# Patient Record
Sex: Male | Born: 1950 | Race: White | Hispanic: No | Marital: Married | Smoking: Never smoker
Health system: Southern US, Community
[De-identification: ages and names within clinical notes are randomized; demographics above are authoritative.]

## PROBLEM LIST (undated history)

## (undated) DIAGNOSIS — I10 Essential (primary) hypertension: Secondary | ICD-10-CM

## (undated) DIAGNOSIS — D497 Neoplasm of unspecified behavior of endocrine glands and other parts of nervous system: Secondary | ICD-10-CM

## (undated) DIAGNOSIS — Q282 Arteriovenous malformation of cerebral vessels: Secondary | ICD-10-CM

## (undated) DIAGNOSIS — E119 Type 2 diabetes mellitus without complications: Secondary | ICD-10-CM

## (undated) DIAGNOSIS — G459 Transient cerebral ischemic attack, unspecified: Secondary | ICD-10-CM

## (undated) HISTORY — PX: HERNIA REPAIR: SHX51

## (undated) HISTORY — PX: EYE SURGERY: SHX253

## (undated) HISTORY — PX: COLONOSCOPY: SHX174

---

## 2006-06-13 ENCOUNTER — Other Ambulatory Visit: Payer: Self-pay

## 2006-06-13 ENCOUNTER — Inpatient Hospital Stay: Payer: Self-pay | Admitting: Internal Medicine

## 2006-12-09 ENCOUNTER — Emergency Department: Payer: Self-pay | Admitting: Emergency Medicine

## 2006-12-11 ENCOUNTER — Emergency Department: Payer: Self-pay

## 2007-05-02 ENCOUNTER — Ambulatory Visit: Payer: Self-pay | Admitting: Unknown Physician Specialty

## 2010-11-04 DIAGNOSIS — R12 Heartburn: Secondary | ICD-10-CM | POA: Insufficient documentation

## 2012-04-11 DIAGNOSIS — N200 Calculus of kidney: Secondary | ICD-10-CM | POA: Insufficient documentation

## 2012-04-11 DIAGNOSIS — R310 Gross hematuria: Secondary | ICD-10-CM | POA: Insufficient documentation

## 2013-01-04 DIAGNOSIS — L988 Other specified disorders of the skin and subcutaneous tissue: Secondary | ICD-10-CM | POA: Insufficient documentation

## 2014-09-25 DIAGNOSIS — F332 Major depressive disorder, recurrent severe without psychotic features: Secondary | ICD-10-CM | POA: Insufficient documentation

## 2015-04-29 DIAGNOSIS — E782 Mixed hyperlipidemia: Secondary | ICD-10-CM | POA: Insufficient documentation

## 2016-08-31 DIAGNOSIS — Q282 Arteriovenous malformation of cerebral vessels: Secondary | ICD-10-CM | POA: Insufficient documentation

## 2016-11-02 DIAGNOSIS — G4733 Obstructive sleep apnea (adult) (pediatric): Secondary | ICD-10-CM | POA: Insufficient documentation

## 2017-03-14 DIAGNOSIS — Z961 Presence of intraocular lens: Secondary | ICD-10-CM | POA: Insufficient documentation

## 2017-05-01 DIAGNOSIS — R471 Dysarthria and anarthria: Secondary | ICD-10-CM | POA: Insufficient documentation

## 2017-05-01 DIAGNOSIS — G459 Transient cerebral ischemic attack, unspecified: Secondary | ICD-10-CM | POA: Insufficient documentation

## 2017-05-05 DIAGNOSIS — M25562 Pain in left knee: Secondary | ICD-10-CM | POA: Insufficient documentation

## 2017-05-05 DIAGNOSIS — G8929 Other chronic pain: Secondary | ICD-10-CM | POA: Insufficient documentation

## 2017-05-13 DIAGNOSIS — I728 Aneurysm of other specified arteries: Secondary | ICD-10-CM | POA: Insufficient documentation

## 2017-07-30 ENCOUNTER — Other Ambulatory Visit: Payer: Self-pay

## 2017-07-30 ENCOUNTER — Observation Stay: Payer: BLUE CROSS/BLUE SHIELD

## 2017-07-30 ENCOUNTER — Observation Stay
Admission: EM | Admit: 2017-07-30 | Discharge: 2017-08-01 | Disposition: A | Discharge status: Home / Self Care | Payer: BLUE CROSS/BLUE SHIELD | Attending: Internal Medicine | Admitting: Internal Medicine

## 2017-07-30 ENCOUNTER — Emergency Department: Payer: BLUE CROSS/BLUE SHIELD

## 2017-07-30 ENCOUNTER — Encounter: Payer: Self-pay | Admitting: Radiology

## 2017-07-30 DIAGNOSIS — E785 Hyperlipidemia, unspecified: Secondary | ICD-10-CM | POA: Insufficient documentation

## 2017-07-30 DIAGNOSIS — G459 Transient cerebral ischemic attack, unspecified: Secondary | ICD-10-CM | POA: Diagnosis present

## 2017-07-30 DIAGNOSIS — I340 Nonrheumatic mitral (valve) insufficiency: Secondary | ICD-10-CM | POA: Insufficient documentation

## 2017-07-30 DIAGNOSIS — I728 Aneurysm of other specified arteries: Secondary | ICD-10-CM | POA: Insufficient documentation

## 2017-07-30 DIAGNOSIS — E119 Type 2 diabetes mellitus without complications: Secondary | ICD-10-CM | POA: Insufficient documentation

## 2017-07-30 DIAGNOSIS — I10 Essential (primary) hypertension: Secondary | ICD-10-CM | POA: Insufficient documentation

## 2017-07-30 DIAGNOSIS — R4701 Aphasia: Secondary | ICD-10-CM | POA: Diagnosis not present

## 2017-07-30 DIAGNOSIS — G4730 Sleep apnea, unspecified: Secondary | ICD-10-CM | POA: Insufficient documentation

## 2017-07-30 DIAGNOSIS — F329 Major depressive disorder, single episode, unspecified: Secondary | ICD-10-CM | POA: Insufficient documentation

## 2017-07-30 DIAGNOSIS — G43109 Migraine with aura, not intractable, without status migrainosus: Principal | ICD-10-CM | POA: Insufficient documentation

## 2017-07-30 DIAGNOSIS — R51 Headache: Secondary | ICD-10-CM

## 2017-07-30 DIAGNOSIS — K219 Gastro-esophageal reflux disease without esophagitis: Secondary | ICD-10-CM | POA: Diagnosis not present

## 2017-07-30 DIAGNOSIS — Z7984 Long term (current) use of oral hypoglycemic drugs: Secondary | ICD-10-CM | POA: Diagnosis not present

## 2017-07-30 DIAGNOSIS — Z8673 Personal history of transient ischemic attack (TIA), and cerebral infarction without residual deficits: Secondary | ICD-10-CM | POA: Diagnosis not present

## 2017-07-30 DIAGNOSIS — Z79899 Other long term (current) drug therapy: Secondary | ICD-10-CM | POA: Insufficient documentation

## 2017-07-30 DIAGNOSIS — Z888 Allergy status to other drugs, medicaments and biological substances status: Secondary | ICD-10-CM | POA: Diagnosis not present

## 2017-07-30 DIAGNOSIS — Q282 Arteriovenous malformation of cerebral vessels: Secondary | ICD-10-CM | POA: Insufficient documentation

## 2017-07-30 DIAGNOSIS — Z7982 Long term (current) use of aspirin: Secondary | ICD-10-CM | POA: Insufficient documentation

## 2017-07-30 DIAGNOSIS — R519 Headache, unspecified: Secondary | ICD-10-CM | POA: Diagnosis present

## 2017-07-30 LAB — CBC
HCT: 47.1 % (ref 40.0–52.0)
Hemoglobin: 15.9 g/dL (ref 13.0–18.0)
MCH: 30.9 pg (ref 26.0–34.0)
MCHC: 33.8 g/dL (ref 32.0–36.0)
MCV: 91.4 fL (ref 80.0–100.0)
Platelets: 235 10*3/uL (ref 150–440)
RBC: 5.15 MIL/uL (ref 4.40–5.90)
RDW: 14.3 % (ref 11.5–14.5)
WBC: 7.6 10*3/uL (ref 3.8–10.6)

## 2017-07-30 LAB — URINALYSIS, ROUTINE W REFLEX MICROSCOPIC
Bilirubin Urine: NEGATIVE
Glucose, UA: NEGATIVE mg/dL
Hgb urine dipstick: NEGATIVE
Ketones, ur: NEGATIVE mg/dL
Leukocytes, UA: NEGATIVE
Nitrite: NEGATIVE
Protein, ur: NEGATIVE mg/dL
Specific Gravity, Urine: 1.02 (ref 1.005–1.030)
pH: 7 (ref 5.0–8.0)

## 2017-07-30 LAB — COMPREHENSIVE METABOLIC PANEL
ALT: 27 U/L (ref 17–63)
AST: 29 U/L (ref 15–41)
Albumin: 4.5 g/dL (ref 3.5–5.0)
Alkaline Phosphatase: 72 U/L (ref 38–126)
Anion gap: 10 (ref 5–15)
BUN: 13 mg/dL (ref 6–20)
CO2: 23 mmol/L (ref 22–32)
Calcium: 9.7 mg/dL (ref 8.9–10.3)
Chloride: 103 mmol/L (ref 101–111)
Creatinine, Ser: 0.99 mg/dL (ref 0.61–1.24)
GFR calc Af Amer: >60 mL/min (ref >60)
GFR calc non Af Amer: >60 mL/min (ref >60)
Glucose, Bld: 148 mg/dL — ABNORMAL HIGH (ref 65–99)
Potassium: 4 mmol/L (ref 3.5–5.1)
Sodium: 136 mmol/L (ref 135–145)
Total Bilirubin: 1 mg/dL (ref 0.3–1.2)
Total Protein: 7.9 g/dL (ref 6.5–8.1)

## 2017-07-30 LAB — URINE DRUG SCREEN, QUALITATIVE (ARMC ONLY)
Amphetamines, Ur Screen: NOT DETECTED
Barbiturates, Ur Screen: POSITIVE — AB
Benzodiazepine, Ur Scrn: NOT DETECTED
Cannabinoid 50 Ng, Ur ~~LOC~~: NOT DETECTED
Cocaine Metabolite,Ur ~~LOC~~: NOT DETECTED
MDMA (Ecstasy)Ur Screen: NOT DETECTED
Methadone Scn, Ur: NOT DETECTED
Opiate, Ur Screen: POSITIVE — AB
Phencyclidine (PCP) Ur S: NOT DETECTED
Tricyclic, Ur Screen: NOT DETECTED

## 2017-07-30 LAB — GLUCOSE, CAPILLARY
Glucose-Capillary: 141 mg/dL — ABNORMAL HIGH (ref 65–99)
Glucose-Capillary: 90 mg/dL (ref 65–99)
Glucose-Capillary: 145 mg/dL — ABNORMAL HIGH (ref 65–99)

## 2017-07-30 LAB — DIFFERENTIAL
Basophils Absolute: 0.1 10*3/uL (ref 0–0.1)
Basophils Relative: 1 %
Eosinophils Absolute: 0.1 10*3/uL (ref 0–0.7)
Eosinophils Relative: 1 %
Lymphocytes Relative: 22 %
Lymphs Abs: 1.6 10*3/uL (ref 1.0–3.6)
Monocytes Absolute: 0.6 10*3/uL (ref 0.2–1.0)
Monocytes Relative: 8 %
Neutro Abs: 5.2 10*3/uL (ref 1.4–6.5)
Neutrophils Relative %: 68 %

## 2017-07-30 LAB — ETHANOL: Alcohol, Ethyl (B): <10 mg/dL (ref <10)

## 2017-07-30 LAB — PROTIME-INR
INR: 0.9
Prothrombin Time: 12.1 seconds (ref 11.4–15.2)

## 2017-07-30 LAB — TROPONIN I: Troponin I: <0.03 ng/mL (ref <0.03)

## 2017-07-30 LAB — APTT: aPTT: 30 seconds (ref 24–36)

## 2017-07-30 MED ORDER — LORAZEPAM 2 MG/ML IJ SOLN
INTRAMUSCULAR | Status: AC
  Administered 2017-07-30: 14:00:00
  Filled 2017-07-30: qty 1

## 2017-07-30 MED ORDER — IOPAMIDOL (ISOVUE-370) INJECTION 76%
75.0000 mL | Freq: Once | INTRAVENOUS | Status: AC | PRN
  Administered 2017-07-30: 75 mL via INTRAVENOUS

## 2017-07-30 MED ORDER — MORPHINE SULFATE (PF) 2 MG/ML IV SOLN
INTRAVENOUS | Status: AC
  Administered 2017-07-30: 2 mg via INTRAVENOUS
  Filled 2017-07-30: qty 1

## 2017-07-30 MED ORDER — ATORVASTATIN CALCIUM 20 MG PO TABS
80.0000 mg | ORAL_TABLET | Freq: Every day | ORAL | Status: DC
  Administered 2017-07-30 – 2017-08-01 (×3): 80 mg via ORAL
  Filled 2017-07-30 (×3): qty 4

## 2017-07-30 MED ORDER — TESTOSTERONE CYPIONATE 200 MG/ML IM SOLN: 200.0000 mg | INTRAMUSCULAR | Status: DC

## 2017-07-30 MED ORDER — HYDROCODONE-ACETAMINOPHEN 5-325 MG PO TABS
2.0000 | ORAL_TABLET | Freq: Four times a day (QID) | ORAL | Status: DC | PRN
  Administered 2017-07-30 – 2017-07-31 (×2): 2 via ORAL
  Filled 2017-07-30 (×2): qty 2

## 2017-07-30 MED ORDER — HYDROXYZINE HCL 10 MG PO TABS
10.0000 mg | ORAL_TABLET | Freq: Every day | ORAL | Status: DC
  Administered 2017-07-30 – 2017-08-01 (×3): 10 mg via ORAL
  Filled 2017-07-30 (×3): qty 1

## 2017-07-30 MED ORDER — ONDANSETRON HCL 4 MG/2ML IJ SOLN
INTRAMUSCULAR | Status: AC
  Administered 2017-07-30: 14:00:00
  Filled 2017-07-30: qty 2

## 2017-07-30 MED ORDER — PANTOPRAZOLE SODIUM 40 MG PO TBEC
40.0000 mg | DELAYED_RELEASE_TABLET | Freq: Every day | ORAL | Status: DC
  Administered 2017-07-31 – 2017-08-01 (×2): 40 mg via ORAL
  Filled 2017-07-30 (×2): qty 1

## 2017-07-30 MED ORDER — METOPROLOL SUCCINATE ER 50 MG PO TB24
75.0000 mg | ORAL_TABLET | Freq: Every day | ORAL | Status: DC
  Administered 2017-07-30 – 2017-08-01 (×3): 75 mg via ORAL
  Filled 2017-07-30 (×3): qty 2

## 2017-07-30 MED ORDER — GABAPENTIN 100 MG PO CAPS
200.0000 mg | ORAL_CAPSULE | Freq: Three times a day (TID) | ORAL | Status: DC
  Administered 2017-07-30 – 2017-08-01 (×6): 200 mg via ORAL
  Filled 2017-07-30 (×6): qty 2

## 2017-07-30 MED ORDER — ACETAMINOPHEN 325 MG PO TABS
650.0000 mg | ORAL_TABLET | ORAL | Status: DC | PRN
  Administered 2017-07-30 – 2017-07-31 (×2): 650 mg via ORAL
  Filled 2017-07-30 (×2): qty 2

## 2017-07-30 MED ORDER — ENOXAPARIN SODIUM 40 MG/0.4ML ~~LOC~~ SOLN
40.0000 mg | SUBCUTANEOUS | Status: DC
  Administered 2017-07-30 – 2017-07-31 (×2): 40 mg via SUBCUTANEOUS
  Filled 2017-07-30 (×2): qty 0.4

## 2017-07-30 MED ORDER — VITAMIN B-12 1000 MCG PO TABS
1000.0000 ug | ORAL_TABLET | Freq: Every day | ORAL | Status: DC
  Administered 2017-07-30 – 2017-08-01 (×3): 1000 ug via ORAL
  Filled 2017-07-30 (×3): qty 1

## 2017-07-30 MED ORDER — MORPHINE SULFATE (PF) 2 MG/ML IV SOLN
2.0000 mg | Freq: Once | INTRAVENOUS | Status: AC
  Administered 2017-07-30: 2 mg via INTRAVENOUS

## 2017-07-30 MED ORDER — ASPIRIN 81 MG PO CHEW
243.0000 mg | CHEWABLE_TABLET | Freq: Once | ORAL | Status: AC
  Administered 2017-07-30: 243 mg via ORAL
  Filled 2017-07-30: qty 3

## 2017-07-30 MED ORDER — VENLAFAXINE HCL ER 150 MG PO CP24
150.0000 mg | ORAL_CAPSULE | Freq: Every day | ORAL | Status: DC
  Filled 2017-07-30 (×2): qty 1

## 2017-07-30 MED ORDER — ONDANSETRON HCL 4 MG/2ML IJ SOLN
4.0000 mg | Freq: Once | INTRAMUSCULAR | Status: AC
  Administered 2017-07-30: 4 mg via INTRAVENOUS

## 2017-07-30 MED ORDER — ACETAMINOPHEN 160 MG/5ML PO SOLN
650.0000 mg | ORAL | Status: DC | PRN
  Filled 2017-07-30: qty 20.3

## 2017-07-30 MED ORDER — MELATONIN 5 MG PO TABS
5.0000 mg | ORAL_TABLET | Freq: Every day | ORAL | Status: DC
  Administered 2017-07-30 – 2017-07-31 (×2): 5 mg via ORAL
  Filled 2017-07-30 (×3): qty 1

## 2017-07-30 MED ORDER — GLIMEPIRIDE 1 MG PO TABS
1.0000 mg | ORAL_TABLET | Freq: Every day | ORAL | Status: DC
  Administered 2017-07-31 – 2017-08-01 (×2): 1 mg via ORAL
  Filled 2017-07-30 (×2): qty 1

## 2017-07-30 MED ORDER — STROKE: EARLY STAGES OF RECOVERY BOOK
Freq: Once | Status: AC
  Administered 2017-07-30: 18:00:00

## 2017-07-30 MED ORDER — ACETAMINOPHEN 650 MG RE SUPP: 650.0000 mg | RECTAL | Status: DC | PRN

## 2017-07-30 MED ORDER — LOSARTAN POTASSIUM 50 MG PO TABS
100.0000 mg | ORAL_TABLET | Freq: Every day | ORAL | Status: DC
  Administered 2017-07-31 – 2017-08-01 (×2): 100 mg via ORAL
  Filled 2017-07-30 (×2): qty 2

## 2017-07-30 MED ORDER — LORAZEPAM 2 MG/ML IJ SOLN
1.0000 mg | Freq: Once | INTRAMUSCULAR | Status: AC
  Administered 2017-07-30: 1 mg via INTRAVENOUS

## 2017-07-30 MED ORDER — VITAMIN D 1000 UNITS PO TABS
1000.0000 [IU] | ORAL_TABLET | Freq: Every day | ORAL | Status: DC
  Administered 2017-07-30 – 2017-08-01 (×3): 1000 [IU] via ORAL
  Filled 2017-07-30 (×3): qty 1

## 2017-07-30 MED ORDER — ASPIRIN EC 81 MG PO TBEC
81.0000 mg | DELAYED_RELEASE_TABLET | Freq: Every day | ORAL | Status: DC
  Administered 2017-07-30 – 2017-08-01 (×3): 81 mg via ORAL
  Filled 2017-07-30 (×3): qty 1

## 2017-07-30 MED ORDER — TRAZODONE HCL 50 MG PO TABS
25.0000 mg | ORAL_TABLET | Freq: Every day | ORAL | Status: DC
  Administered 2017-07-30 – 2017-07-31 (×2): 25 mg via ORAL
  Filled 2017-07-30 (×2): qty 1

## 2017-07-30 MED ORDER — INSULIN ASPART 100 UNIT/ML ~~LOC~~ SOLN
0.0000 [IU] | Freq: Three times a day (TID) | SUBCUTANEOUS | Status: DC
  Administered 2017-07-31: 17:00:00 3 [IU] via SUBCUTANEOUS
  Administered 2017-07-31 – 2017-08-01 (×4): 2 [IU] via SUBCUTANEOUS
  Filled 2017-07-30 (×5): qty 1

## 2017-07-30 NOTE — ED Notes (Signed)
Pt in CT.

## 2017-07-30 NOTE — ED Notes (Signed)
Teleneuro at bedside 

## 2017-07-30 NOTE — ED Notes (Signed)
ED Provider at bedside. 

## 2017-07-30 NOTE — Progress Notes (Signed)
   07/30/17 1410  Clinical Encounter Type  Visited With Patient and family together  Visit Type Follow-up  Spiritual Encounters  Spiritual Needs Emotional   Chaplain followed up with patient and family.  Family  appreciative of check in; chaplain encouraged them to page if needed.

## 2017-07-30 NOTE — Progress Notes (Signed)
CODE STROKE- PHARMACY COMMUNICATION   Time CODE STROKE called/page received: 1322  Time response to CODE STROKE was made (in person or via phone): 1329 via phone  Time Stroke Kit retrieved from [employer] (only if needed): N/A  Name of Provider/Nurse contacted: Contacted Hunter RN who stated he did not need anything from pharmacy. Also called med rec tech about code stroke as pager was in pharmacy.   No past medical history on file. Prior to Admission medications   Not on File    Jon [name] ,PharmD Clinical Pharmacist  07/30/2017  1:26 PM

## 2017-07-30 NOTE — ED Notes (Signed)
Pt was stiff s/p medication administration. Cinda Quest MD notified. Tele neuro on screen at time of event. IV Ativan order received from MD.

## 2017-07-30 NOTE — Consult Note (Signed)
[facility] TeleNeurology Consult Services   DATE: July 30, 2017 Impression: Recurrent left-sided weakness-same symptoms he had at [facility] in December although they were transient he did not have an abnormal MRI of the brain.  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  There is no significant intracranial stenosis however.  Unclear if the spells or even vascular or some other etiology.  In EEG was even done at [facility]  Recommend that he get the equivalent of a full-strength aspirin already took 81 mg today.  Can get a follow-up CT Andrew head and neck to reassess carotid pseudoaneurysm and intra-cranial vasculature.  Can admit for observation neurology consultation and consideration for MRI brain  Not a tpa candidate due to: Prior hemorrhage listed on an MRI in 2008 also has AVM  Symptoms (not) consistent with LVO therefore no role for emergency neuro-intervention however would perform vascular imaging  Differential Diagnosis: TIA, stroke, seizure 1. Cardioembolic stroke 2. Small vessel disease/lacune 3. Thromboembolic, artery-to-artery mechanism 4. Hypercoagulable state-related infarct 5. Transient ischemic attack 6. Thrombotic mechanism, large artery disease  Comments: Last known normal 11:30 Door time 12:58 [facility] contacted: 13:32 [facility] at bedside: 13:36 NIHSS assessment time: 13:36  Recommendations: -Continue aspirin -Vascular imaging as above -Admitted for neurology consultation observation  Inpatient neurology consultation Inpatient stroke evaluation as per Neurology/ Internal Medicine Discussed with ED MD Please call with questions ----------------------------------------------------------------------------------------- CC left-sided weakness neck pain and headache  History of Present Illness  Patient is a 67 year old gentleman with complicated past medical history who has a pituitary tumor, recent mini stroke  event in December with left-sided weakness and slurred speech as well as a left temporal AVM.  He also has a subclavian pseudoaneurysm.  He was just seen at [facility] in December for left-sided weakness and speech trouble MRI brain and EEG were unremarkable CT angiogram of the head and neck shows changes consistent with known AV malformation and pseudoaneurysm.  No intracranial stenosis seen.  Sounds like it wants to switch him to Plavix but he refused and he only takes aspirin daily.  Today at 11:30 a.m. he developed headache left-sided weakness and speech disturbance and numbness once again  Diagnostic: CT head without contrast does not show any acute changes stable  Exam: 1a- LOC: Keenly responsive - =0     1b- LOC questions: Answers both questions correctly - 0     1c- LOC commands- Performs both tasks correctly- 0     2- Gaze: Normal; no gaze paresis or gaze deviation - 0     3- Visual Fields: normal, no Visual field deficit - 0     4- Facial movements: no facial palsy - 0     5- Upper limb motor - left upper extremity drift=1   6- Lower limb motor - no drift - 0     left lower extremity weaker no drift 7- Limb Coordination: absent ataxia - 0      8- Sensory decreased sensation on the left=1    9- Language - No aphasia - 0      10- Speech - No dysarthria -0     11- Neglect / Extinction - none found -0     NIHSS score  2   Medical Decision Making: - Extensive number of diagnosis or management options are considered above. - Extensive amount of complex data reviewed. - High risk of complication and/or morbidity or mortality are associated with differential diagnostic considerations above. - There may be  Uncertain outcome and increased probability of prolonged functional impairment or high probability of severe prolonged functional impairment associated with some of these differential diagnosis. Medical Data Reviewed: 1.Data reviewed include clinical labs, radiology, Medical  Tests; 2.Tests results discussed w/performing or interpreting physician; 3.Obtaining/reviewing old medical records; 4.Obtaining case history from another source; 5.Independent review of image, tracing or specimen. Patient was informed the Neurology Consult would happen via telehealth (remote video) and consented to receiving care in this manner.

## 2017-07-30 NOTE — ED Provider Notes (Signed)
[facility] Emergency Department Provider Note   ____________________________________________   First MD Initiated Contact with Patient 07/30/17 1335     (approximate)  I have reviewed the triage vital signs and the nursing notes.   HISTORY  Chief Complaint Code Stroke    HPI Jon [name] is a 67 y.o. male Age with a history of increased blood pressure yesterday THIS morning was doing well about 11:30 get nauseated and headachy at around noon he began having slurred speech and difficulty finding words and then difficulty speaking at all. On arrival in the emergency room is not talking is very weak left side is weak he cannot keep his left leg up off the bed and he cannot keep his left arm from drifting. Patient's wife reports he has an AVM in the speech center pituitary tumor and a aneurysm in the  subclavian arteryhe [facility] do not see in the old records where he got anything IV.   History reviewed. No pertinent past medical history.  There are no active problems to display for this patient.   Prior to Admission medications   Medication Sig Start Date End Date Taking? Authorizing Provider  aspirin EC 81 MG tablet Take 81 mg by mouth daily.   Yes [provider]  atorvastatin (LIPITOR) 80 MG tablet Take 80 mg by mouth daily. 07/01/17  Yes [provider]  Cholecalciferol (D3-1000) 1000 units tablet Take 1,000 Units by mouth daily.   Yes [provider]  gabapentin (NEURONTIN) 100 MG capsule Take 200 mg by mouth 3 (three) times daily.   Yes [provider]  glimepiride (AMARYL) 1 MG tablet Take 1 mg by mouth daily. 06/20/17  Yes [provider]  hydrOXYzine (ATARAX/VISTARIL) 10 MG tablet Take 1 tablet by mouth daily. 07/01/17  Yes [provider]  losartan (COZAAR) 100 MG tablet Take 100 mg by mouth daily. 06/14/17  Yes [provider]  Melatonin 3 MG TABS Take 6 mg by mouth at  bedtime.   Yes [provider]  metoprolol succinate (TOPROL-XL) 50 MG 24 hr tablet Take 75 mg by mouth daily.  07/28/17  Yes [provider]  pantoprazole (PROTONIX) 40 MG tablet Take 40 mg by mouth daily. 06/27/17  Yes [provider]  testosterone cypionate (DEPOTESTOSTERONE CYPIONATE) 200 MG/ML injection Inject 200 mg into the muscle every 14 (fourteen) days. 07/29/17  Yes [provider]  traZODone (DESYREL) 50 MG tablet Take 25 mg by mouth at bedtime.   Yes [provider]  venlafaxine XR (EFFEXOR-XR) 150 MG 24 hr capsule Take 150 mg by mouth daily. 06/13/17  Yes [provider]  vitamin B-12 (CYANOCOBALAMIN) 1000 MCG tablet Take 1,000 mcg by mouth daily.   Yes [provider]    Allergies Beta adrenergic blockers; Topiramate; Lisinopril; Nabumetone; and Valproic acid  No family history on file.  Social History Social History   Tobacco Use  . Smoking status: Not on file  Substance Use Topics  . Alcohol use: Not on file  . Drug use: Not on file    Review of Systems  Constitutional: No fever/chills Eyes: No visual changes. ENT: No sore throat. Cardiovascular: Denies chest pain. Respiratory: Denies shortness of breath. Gastrointestinal: No abdominal pain.  No nausea, no vomiting.  No diarrhea.  No constipation. Genitourinary: Negative for dysuria. Musculoskeletal: Negative for back pain. Skin: Negative for rash. Neurological: left-sided weakness and difficulty speaking  ____________________________________________   PHYSICAL EXAM:  VITAL SIGNS: ED Triage Vitals  Enc Vitals Group     BP      Pulse      Resp      Temp      Temp src      SpO2      Weight      Height      Head Circumference      Peak Flow      Pain Score      Pain Loc      Pain Edu?      Excl. in [place]?     Constitutional:initially patient was sleepy and weak and unable to speak Eyes: Conjunctivae are normal. PERRL. EOMI. Head:  Atraumatic. Nose: No congestion/rhinnorhea. Mouth/Throat: Mucous membranes are moist.  Oropharynx non-erythematous. Neck: No stridor.   Cardiovascular: Normal rate, regular rhythm. Grossly normal heart sounds.  Good peripheral circulation. Respiratory: Normal respiratory effort.  No retractions. Lungs CTAB. Gastrointestinal: Soft and nontender. No distention. No abdominal bruits. No CVA tenderness. }Musculoskeletal: No lower extremity tenderness nor edema .Neurologic:initially patient was unable to speak had some difficulty following commands left-sided droop very slow with the rapid alternating movements and finger to nose and keep his left leg up off the bed this all improves later patient is able to speak fairly clearly neurologist rechecked the legs and down left leg was good just a little drifting on the left arm. Skin:  Skin is warm, dry and intact. No rash noted. Psychiatric: Mood and affect are normal. Speech and behavior are normal.  ____________________________________________   LABS (all labs ordered are listed, but only abnormal results are displayed)  Labs Reviewed  COMPREHENSIVE METABOLIC PANEL - Abnormal; Notable for the following components:      Result Value   Glucose, Bld 148 (*)    All other components within normal limits  GLUCOSE, CAPILLARY - Abnormal; Notable for the following components:   Glucose-Capillary 141 (*)    All other components within normal limits  ETHANOL  PROTIME-INR  APTT  CBC  DIFFERENTIAL  TROPONIN I  URINE DRUG SCREEN, QUALITATIVE ([facility] ONLY)  URINALYSIS, ROUTINE W REFLEX MICROSCOPIC   ____________________________________________  EKG  EKG read and interpreted by me shows normal sinus rhythm rate of 92 normal axis no acute ST-T changes ____________________________________________  RADIOLOGY  ED MD interpretation:  CT read by radiology shows no acute disease  Official radiology report(s): Ct Angio Head W Or Wo Contrast  Result  Date: 07/30/2017 CLINICAL DATA:  67 y/o M; severe headache and speech difficulty. History of pseudoaneurysm. EXAM: CT ANGIOGRAPHY HEAD AND NECK TECHNIQUE: Multidetector CT imaging of the head and neck was performed using the standard protocol during bolus administration of intravenous contrast. Multiplanar CT image reconstructions and MIPs were obtained to evaluate the vascular anatomy. Carotid stenosis measurements (when applicable) are obtained utilizing NASCET criteria, using the distal internal carotid diameter as the denominator. CONTRAST:  85mL ISOVUE-370 IOPAMIDOL (ISOVUE-370) INJECTION 76% COMPARISON:  07/30/2017 CT head.  06/15/2006 MRI/MRA head. FINDINGS: CTA NECK FINDINGS Aortic arch: Standard branching. Imaged portion shows no evidence of aneurysm or dissection. 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 Right carotid system: No evidence of dissection, stenosis (50% or greater) or occlusion. Left carotid system: No evidence of dissection, stenosis (50% or greater) or occlusion. Vertebral arteries: Outpouching of the vessel wall with flap involving left subclavian artery  extends to the left vertebral artery origin there is calcified plaque resulting in moderate left vertebral artery origin stenosis. Right dominant vertebral artery. The arteries in the neck or otherwise patent without dissection or stenosis. Skeleton: Moderate cervical spondylosis greatest at the C5-C7 levels or uncovertebral and facet hypertrophy result in left-greater-than-right foraminal encroachment. No high-grade canal stenosis. Other neck: Negative. Upper chest: Negative. Review of the MIP images confirms the above findings CTA HEAD FINDINGS Anterior circulation: No significant stenosis, proximal occlusion, or aneurysm. Mild non stenotic calcific atherosclerosis of carotid siphons. Blush of enhancement  measuring 14 x 18 x 15 mm (AP x ML x CC series 6, image 266 and series 7, image 130) with prominent draining veins extending into an enlarged left cortical vein which extends over the left frontal lobe to the superior sagittal sinus anteriorly. Posterior circulation: No significant stenosis, proximal occlusion, aneurysm, or vascular malformation. Venous sinuses: As permitted by contrast timing, patent. Anatomic variants: Diminutive anterior communicating artery. Posterior communicating artery not identified, likely hypoplastic or absent. Delayed phase: Blush of enhancement in the left superolateral frontal lobe measuring 14 x 18 x 15 mm (AP x ML x CC series 6, image 266 and series 7, image 130) with prominent draining veins extending into an enlarged left cortical vein which extends over the left frontal lobe to the superior sagittal sinus anteriorly. Review of the MIP images confirms the above findings IMPRESSION: CTA neck: 1. Left subclavian artery vessel wall outpouching with flap possibly representing pseudoaneurysm or focal dissection measuring up to 19 mm extending to left vertebral artery origin. 2. Moderate left vertebral artery origin stenosis. 3. No significant stenosis, occlusion, or dissection of bilateral carotid systems and right vertebral artery. CTA head: 1. No large vessel occlusion, aneurysm, or significant stenosis of the anterior or posterior intracranial circulation. 2. 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 These results were called by telephone at the time of interpretation on 07/30/2017 at 3:15 pm to Dr. Conni Slipper , who verbally acknowledged these results. Electronically Signed   By: Kristine Garbe M.D.   On: 07/30/2017 15:18   Ct Angio Neck W And/or Wo Contrast  Result Date:  07/30/2017 CLINICAL DATA:  67 y/o M; severe headache and speech difficulty. History of pseudoaneurysm. EXAM: CT ANGIOGRAPHY HEAD AND NECK TECHNIQUE: Multidetector CT imaging of the head and neck was performed using the standard protocol during bolus administration of intravenous contrast. Multiplanar CT image reconstructions and MIPs were obtained to evaluate the vascular anatomy. Carotid stenosis measurements (when applicable) are obtained utilizing NASCET criteria, using the distal internal carotid diameter as the denominator. CONTRAST:  64mL ISOVUE-370 IOPAMIDOL (ISOVUE-370) INJECTION 76% COMPARISON:  07/30/2017 CT head.  06/15/2006 MRI/MRA head. FINDINGS: CTA NECK FINDINGS Aortic arch: Standard branching. Imaged portion shows no evidence of aneurysm or dissection. 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 Right carotid system: No evidence of dissection, stenosis (50% or greater) or occlusion. Left carotid system: No evidence of dissection, stenosis (50% or greater) or occlusion. Vertebral arteries: Outpouching of the vessel wall with flap involving left subclavian artery extends to the left vertebral artery origin there is calcified plaque resulting in moderate left vertebral artery origin stenosis. Right dominant vertebral  artery. The arteries in the neck or otherwise patent without dissection or stenosis. Skeleton: Moderate cervical spondylosis greatest at the C5-C7 levels or uncovertebral and facet hypertrophy result in left-greater-than-right foraminal encroachment. No high-grade canal stenosis. Other neck: Negative. Upper chest: Negative. Review of the MIP images confirms the above findings CTA HEAD FINDINGS Anterior circulation: No significant stenosis, proximal occlusion, or aneurysm. Mild non stenotic calcific atherosclerosis of carotid siphons. Blush of enhancement measuring  14 x 18 x 15 mm (AP x ML x CC series 6, image 266 and series 7, image 130) with prominent draining veins extending into an enlarged left cortical vein which extends over the left frontal lobe to the superior sagittal sinus anteriorly. Posterior circulation: No significant stenosis, proximal occlusion, aneurysm, or vascular malformation. Venous sinuses: As permitted by contrast timing, patent. Anatomic variants: Diminutive anterior communicating artery. Posterior communicating artery not identified, likely hypoplastic or absent. Delayed phase: Blush of enhancement in the left superolateral frontal lobe measuring 14 x 18 x 15 mm (AP x ML x CC series 6, image 266 and series 7, image 130) with prominent draining veins extending into an enlarged left cortical vein which extends over the left frontal lobe to the superior sagittal sinus anteriorly. Review of the MIP images confirms the above findings IMPRESSION: CTA neck: 1. Left subclavian artery vessel wall outpouching with flap possibly representing pseudoaneurysm or focal dissection measuring up to 19 mm extending to left vertebral artery origin. 2. Moderate left vertebral artery origin stenosis. 3. No significant stenosis, occlusion, or dissection of bilateral carotid systems and right vertebral artery. CTA head: 1. No large vessel occlusion, aneurysm, or significant stenosis of the anterior or posterior intracranial circulation. 2. 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 These results were called by telephone at the time of interpretation on 07/30/2017 at 3:15 pm to Dr. Conni Slipper , who verbally acknowledged these results. Electronically Signed   By: Kristine Garbe M.D.   On: 07/30/2017 15:18   Ct Head Code Stroke Wo Contrast  Result Date: 07/30/2017 CLINICAL DATA:   Code stroke. 67 y/o M; severe headache and difficulty with speech. Left-sided weakness. History of TIA. EXAM: CT HEAD WITHOUT CONTRAST TECHNIQUE: Contiguous axial images were obtained from the base of the skull through the vertex without intravenous contrast. COMPARISON:  06/13/2006 CT head FINDINGS: Brain: No evidence of acute infarction, hemorrhage, hydrocephalus, extra-axial collection or mass lesion/mass effect. Nonspecific foci of hypoattenuation in subcortical and periventricular white matter compatible with chronic microvascular ischemic changes and mildly progressed from 2008. There are very small chronic infarcts within the cerebellar hemispheres bilaterally and mild brain parenchymal volume loss. Vascular: Calcific atherosclerosis of carotid siphons. No hyperdense vessel identified. Skull: Normal. Negative for fracture or focal lesion. Sinuses/Orbits: Bilateral intra-ocular lens replacement. Otherwise negative. Other: None. ASPECTS [facility] Stroke Program Early CT Score) - Ganglionic level infarction (caudate, lentiform nuclei, internal capsule, insula, M1-M3 cortex): 7 - Supraganglionic infarction (M4-M6 cortex): 3 Total score (0-10 with 10 being normal): 10 IMPRESSION: 1. No acute intracranial. 2. Mild interval progression of chronic microvascular ischemic changes and parenchymal volume loss of the brain. 3. ASPECTS is 10 These results were called by telephone at the time of interpretation on 07/30/2017 at 1:32 pm to Dr. Conni Slipper , who verbally acknowledged these results. Electronically Signed   By: Edgardo Roys.D.  On: 07/30/2017 13:34    ____________________________________________   PROCEDURES  Procedure(s) performed:   Procedures  Critical Care performed:   ____________________________________________   INITIAL IMPRESSION / ASSESSMENT AND PLAN / ED COURSE  tell a neurology will advises to admit the patient get a routine MRI on some overnight sounds like the CT  angiogram is similar to prior.         ____________________________________________   FINAL CLINICAL IMPRESSION(S) / ED DIAGNOSES  Final diagnoses:  TIA (transient ischemic attack)     ED Discharge Orders    None       Note:  This document was prepared using Dragon voice recognition software and may include unintentional dictation errors.    Nena Polio, MD 07/30/17 [phone]

## 2017-07-30 NOTE — H&P (Signed)
[facility] NAME: Jon [name]    MR#:  [id number]  DATE OF BIRTH:  Jun 06, 1950  DATE OF ADMISSION:  07/30/2017  PRIMARY CARE PHYSICIAN: Cooner, Deanne Coffer, MD   REQUESTING/REFERRING PHYSICIAN: Dr. Cinda Quest  CHIEF COMPLAINT:   Chief Complaint  Patient presents with  . Code Stroke    HISTORY OF PRESENT ILLNESS:  Jon [name]  is a 67 y.o. male with a known history of pituitary tumor comes in because of hypertension, hyperlipidemia came in because of slurred speech, word finding difficulty, IV.  Soft and non-.  According to patient and his wife BP was 160/90 at home and he also noted to have difficulty getting the correct words out.  No weakness of hands or legs.  Patient has history of pituitary tumor, depression, pseudoaneurysm of subclavian artery, AV malformation of the brain.  Patient complains of headache.  He denies any double vision or blurred vision.  He states that he has chronic nausea, chronic headaches.  Patient follows up at [facility] regarding his brain tumor.  PAST MEDICAL HISTORY:  History reviewed. No pertinent past medical history.  PAST SURGICAL HISTOIRY:    SOCIAL HISTORY:   Social History   Tobacco Use  . Smoking status: Not on file  Substance Use Topics  . Alcohol use: Not on file    FAMILY HISTORY:  No family history on file.  DRUG ALLERGIES:   Allergies  Allergen Reactions  . Beta Adrenergic Blockers Itching  . Topiramate Nausea And Vomiting    Other reaction(s): Dizziness, Hallucination Other Reaction: violence  . Lisinopril Itching    Other reaction(s): Other (See Comments) Leg pain  . Nabumetone     Other reaction(s): Other (See Comments)  . Valproic Acid     Other reaction(s): Headache    REVIEW OF SYSTEMS:  CONSTITUTIONAL: No fever, fatigue or weakness.  Has some headache, word finding difficulties since morning. EYES: No blurred or double vision.  EARS, NOSE, AND  THROAT: No tinnitus or ear pain.  RESPIRATORY: No cough, shortness of breath, wheezing or hemoptysis.  CARDIOVASCULAR: No chest pain, orthopnea, edema.  GASTROINTESTINAL: No nausea, vomiting, diarrhea or abdominal pain.  GENITOURINARY: No dysuria, hematuria.  ENDOCRINE: No polyuria, nocturia,  HEMATOLOGY: No anemia, easy bruising or bleeding SKIN: No rash or lesion. MUSCULOSKELETAL: No joint pain or arthritis.   NEUROLOGIC: No tingling, numbness, weakness.  PSYCHIATRY: No anxiety or depression.   MEDICATIONS AT HOME:   Prior to Admission medications   Medication Sig Start Date End Date Taking? Authorizing Provider  aspirin EC 81 MG tablet Take 81 mg by mouth daily.   Yes [provider]  atorvastatin (LIPITOR) 80 MG tablet Take 80 mg by mouth daily. 07/01/17  Yes [provider]  Cholecalciferol (D3-1000) 1000 units tablet Take 1,000 Units by mouth daily.   Yes [provider]  gabapentin (NEURONTIN) 100 MG capsule Take 200 mg by mouth 3 (three) times daily.   Yes [provider]  glimepiride (AMARYL) 1 MG tablet Take 1 mg by mouth daily. 06/20/17  Yes [provider]  hydrOXYzine (ATARAX/VISTARIL) 10 MG tablet Take 1 tablet by mouth daily. 07/01/17  Yes [provider]  losartan (COZAAR) 100 MG tablet Take 100 mg by mouth daily. 06/14/17  Yes [provider]  Melatonin 3 MG TABS Take 6 mg by mouth at bedtime.   Yes [provider]  metoprolol succinate (TOPROL-XL) 50 MG 24 hr tablet Take 75  mg by mouth daily.  07/28/17  Yes [provider]  pantoprazole (PROTONIX) 40 MG tablet Take 40 mg by mouth daily. 06/27/17  Yes [provider]  testosterone cypionate (DEPOTESTOSTERONE CYPIONATE) 200 MG/ML injection Inject 200 mg into the muscle every 14 (fourteen) days. 07/29/17  Yes [provider]  traZODone (DESYREL) 50 MG tablet Take 25 mg by mouth at bedtime.   Yes [provider]  venlafaxine  XR (EFFEXOR-XR) 150 MG 24 hr capsule Take 150 mg by mouth daily. 06/13/17  Yes [provider]  vitamin B-12 (CYANOCOBALAMIN) 1000 MCG tablet Take 1,000 mcg by mouth daily.   Yes [provider]      VITAL SIGNS:  Blood pressure (!) 154/91, pulse 89, resp. rate 14, SpO2 98 %.  PHYSICAL EXAMINATION:  GENERAL:  67 y.o.-year-old patient lying in the bed with no acute distress.  EYES: Pupils equal, round, reactive to light . No scleral icterus. Extraocular muscles intact.  HEENT: Head atraumatic, normocephalic. Oropharynx and nasopharynx clear.  NECK:  Supple, no jugular venous distention. No thyroid enlargement, no tenderness.  LUNGS: Normal breath sounds bilaterally, no wheezing, rales,rhonchi or crepitation. No use of accessory muscles of respiration.  CARDIOVASCULAR: S1, S2 normal. No murmurs, rubs, or gallops.  ABDOMEN: Soft, nontender, nondistended. Bowel sounds present. No organomegaly or mass.  EXTREMITIES: No pedal edema, cyanosis, or clubbing.  NEUROLOGIC: Cranial nerves II through XII are intact. Muscle strength 5/5 in all extremities. Sensation intact. Gait not checked.  PSYCHIATRIC: The patient is alert and oriented x 3.  SKIN: No obvious rash, lesion, or ulcer.   LABORATORY PANEL:   CBC Recent Labs  Lab 07/30/17 1326  WBC 7.6  HGB 15.9  HCT 47.1  PLT 235   ------------------------------------------------------------------------------------------------------------------  Chemistries  Recent Labs  Lab 07/30/17 1326  NA 136  K 4.0  CL 103  CO2 23  GLUCOSE 148*  BUN 13  CREATININE 0.99  CALCIUM 9.7  AST 29  ALT 27  ALKPHOS 72  BILITOT 1.0   ------------------------------------------------------------------------------------------------------------------  Cardiac Enzymes Recent Labs  Lab 07/30/17 1326  TROPONINI <0.03    ------------------------------------------------------------------------------------------------------------------  RADIOLOGY:  Ct Angio Head W Or Wo Contrast  Result Date: 07/30/2017 CLINICAL DATA:  67 y/o M; severe headache and speech difficulty. History of pseudoaneurysm. EXAM: CT ANGIOGRAPHY HEAD AND NECK TECHNIQUE: Multidetector CT imaging of the head and neck was performed using the standard protocol during bolus administration of intravenous contrast. Multiplanar CT image reconstructions and MIPs were obtained to evaluate the vascular anatomy. Carotid stenosis measurements (when applicable) are obtained utilizing NASCET criteria, using the distal internal carotid diameter as the denominator. CONTRAST:  3mL ISOVUE-370 IOPAMIDOL (ISOVUE-370) INJECTION 76% COMPARISON:  07/30/2017 CT head.  06/15/2006 MRI/MRA head. FINDINGS: CTA NECK FINDINGS Aortic arch: Standard branching. Imaged portion shows no evidence of aneurysm or dissection. 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 Right carotid system: No evidence of dissection, stenosis (50% or greater) or occlusion. Left carotid system: No evidence of dissection, stenosis (50% or greater) or occlusion. Vertebral arteries: Outpouching of the vessel wall with flap involving left subclavian artery extends to the left vertebral artery origin there is calcified plaque resulting in moderate left vertebral artery origin stenosis. Right dominant vertebral artery. The arteries in the neck or otherwise patent without dissection or stenosis. Skeleton: Moderate cervical spondylosis greatest at the C5-C7 levels  or uncovertebral and facet hypertrophy result in left-greater-than-right foraminal encroachment. No high-grade canal stenosis. Other neck: Negative. Upper chest: Negative. Review of the MIP images confirms the above findings CTA HEAD  FINDINGS Anterior circulation: No significant stenosis, proximal occlusion, or aneurysm. Mild non stenotic calcific atherosclerosis of carotid siphons. Blush of enhancement measuring 14 x 18 x 15 mm (AP x ML x CC series 6, image 266 and series 7, image 130) with prominent draining veins extending into an enlarged left cortical vein which extends over the left frontal lobe to the superior sagittal sinus anteriorly. Posterior circulation: No significant stenosis, proximal occlusion, aneurysm, or vascular malformation. Venous sinuses: As permitted by contrast timing, patent. Anatomic variants: Diminutive anterior communicating artery. Posterior communicating artery not identified, likely hypoplastic or absent. Delayed phase: Blush of enhancement in the left superolateral frontal lobe measuring 14 x 18 x 15 mm (AP x ML x CC series 6, image 266 and series 7, image 130) with prominent draining veins extending into an enlarged left cortical vein which extends over the left frontal lobe to the superior sagittal sinus anteriorly. Review of the MIP images confirms the above findings IMPRESSION: CTA neck: 1. Left subclavian artery vessel wall outpouching with flap possibly representing pseudoaneurysm or focal dissection measuring up to 19 mm extending to left vertebral artery origin. 2. Moderate left vertebral artery origin stenosis. 3. No significant stenosis, occlusion, or dissection of bilateral carotid systems and right vertebral artery. CTA head: 1. No large vessel occlusion, aneurysm, or significant stenosis of the anterior or posterior intracranial circulation. 2. 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 These results were called by telephone at the time of interpretation on 07/30/2017 at 3:15 pm to Dr. Conni Slipper , who verbally  acknowledged these results. Electronically Signed   By: Kristine Garbe M.D.   On: 07/30/2017 15:18   Ct Angio Neck W And/or Wo Contrast  Result Date: 07/30/2017 CLINICAL DATA:  67 y/o M; severe headache and speech difficulty. History of pseudoaneurysm. EXAM: CT ANGIOGRAPHY HEAD AND NECK TECHNIQUE: Multidetector CT imaging of the head and neck was performed using the standard protocol during bolus administration of intravenous contrast. Multiplanar CT image reconstructions and MIPs were obtained to evaluate the vascular anatomy. Carotid stenosis measurements (when applicable) are obtained utilizing NASCET criteria, using the distal internal carotid diameter as the denominator. CONTRAST:  42mL ISOVUE-370 IOPAMIDOL (ISOVUE-370) INJECTION 76% COMPARISON:  07/30/2017 CT head.  06/15/2006 MRI/MRA head. FINDINGS: CTA NECK FINDINGS Aortic arch: Standard branching. Imaged portion shows no evidence of aneurysm or dissection. 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 Right carotid system: No evidence of dissection, stenosis (50% or greater) or occlusion. Left carotid system: No evidence of dissection, stenosis (50% or greater) or occlusion. Vertebral arteries: Outpouching of the vessel wall with flap involving left subclavian artery extends to the left vertebral artery origin there is calcified plaque resulting in moderate left vertebral artery origin stenosis. Right dominant vertebral artery. The arteries in the neck or otherwise patent without dissection or stenosis. Skeleton: Moderate cervical spondylosis greatest at the C5-C7 levels or uncovertebral and facet hypertrophy result in left-greater-than-right foraminal encroachment. No high-grade canal stenosis. Other neck: Negative. Upper chest: Negative. Review of  the MIP images confirms the above findings CTA HEAD FINDINGS Anterior  circulation: No significant stenosis, proximal occlusion, or aneurysm. Mild non stenotic calcific atherosclerosis of carotid siphons. Blush of enhancement measuring 14 x 18 x 15 mm (AP x ML x CC series 6, image 266 and series 7, image 130) with prominent draining veins extending into an enlarged left cortical vein which extends over the left frontal lobe to the superior sagittal sinus anteriorly. Posterior circulation: No significant stenosis, proximal occlusion, aneurysm, or vascular malformation. Venous sinuses: As permitted by contrast timing, patent. Anatomic variants: Diminutive anterior communicating artery. Posterior communicating artery not identified, likely hypoplastic or absent. Delayed phase: Blush of enhancement in the left superolateral frontal lobe measuring 14 x 18 x 15 mm (AP x ML x CC series 6, image 266 and series 7, image 130) with prominent draining veins extending into an enlarged left cortical vein which extends over the left frontal lobe to the superior sagittal sinus anteriorly. Review of the MIP images confirms the above findings IMPRESSION: CTA neck: 1. Left subclavian artery vessel wall outpouching with flap possibly representing pseudoaneurysm or focal dissection measuring up to 19 mm extending to left vertebral artery origin. 2. Moderate left vertebral artery origin stenosis. 3. No significant stenosis, occlusion, or dissection of bilateral carotid systems and right vertebral artery. CTA head: 1. No large vessel occlusion, aneurysm, or significant stenosis of the anterior or posterior intracranial circulation. 2. 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 These results were called by telephone at the time of interpretation on 07/30/2017 at 3:15 pm to Dr. Conni Slipper , who verbally acknowledged these  results. Electronically Signed   By: Kristine Garbe M.D.   On: 07/30/2017 15:18   Ct Head Code Stroke Wo Contrast  Result Date: 07/30/2017 CLINICAL DATA:  Code stroke. 67 y/o M; severe headache and difficulty with speech. Left-sided weakness. History of TIA. EXAM: CT HEAD WITHOUT CONTRAST TECHNIQUE: Contiguous axial images were obtained from the base of the skull through the vertex without intravenous contrast. COMPARISON:  06/13/2006 CT head FINDINGS: Brain: No evidence of acute infarction, hemorrhage, hydrocephalus, extra-axial collection or mass lesion/mass effect. Nonspecific foci of hypoattenuation in subcortical and periventricular white matter compatible with chronic microvascular ischemic changes and mildly progressed from 2008. There are very small chronic infarcts within the cerebellar hemispheres bilaterally and mild brain parenchymal volume loss. Vascular: Calcific atherosclerosis of carotid siphons. No hyperdense vessel identified. Skull: Normal. Negative for fracture or focal lesion. Sinuses/Orbits: Bilateral intra-ocular lens replacement. Otherwise negative. Other: None. ASPECTS [facility] Stroke Program Early CT Score) - Ganglionic level infarction (caudate, lentiform nuclei, internal capsule, insula, M1-M3 cortex): 7 - Supraganglionic infarction (M4-M6 cortex): 3 Total score (0-10 with 10 being normal): 10 IMPRESSION: 1. No acute intracranial. 2. Mild interval progression of chronic microvascular ischemic changes and parenchymal volume loss of the brain. 3. ASPECTS is 10 These results were called by telephone at the time of interpretation on 07/30/2017 at 1:32 pm to Dr. Conni Slipper , who verbally acknowledged these results. Electronically Signed   By: Kristine Garbe M.D.   On: 07/30/2017 13:34    EKG:   Orders placed or performed during the hospital encounter of 07/30/17  . ED EKG  . ED EKG  . EKG 12-Lead  . EKG 12-Lead  EKG showed normal sinus rhythm at 92  bpm, no  ST-T changes.  IMPRESSION AND PLAN:   67 year old male patient with history of diabetes mellitus type 2, hyperlipidemia, hypertension, GERD, sleep apnea, pseudoaneurysm of subclavian artery, previous TIAs, AV malformations of the brain comes in because of slurred speech, elevated blood pressure, expressive aphasia.  Telemetry neurology has been called and recommended observation with MRI of the brain, echo, stroke workup.  Patient has no neurological deficits at this time and his initial stroke scale is 0.  Expressive aphasia: Rule out stroke/TIA: Admitted, check MRI of the brain, echo, carotid ultrasound, continue aspirin, statins PT OT consult, neurology consult if if imaging is positive for stroke.  CT angios head and neck showed no acute stroke no large vessel occlusion, patient noted to have AV malformation and parietal area,, left subclavian artery pseudoaneurysm, these are not changed much compared to previous CT 2.  History of diabetes mellitus type 2: Get a speech evaluation, patient if  he passes speech eval diet and start the patient on home diabetic medication including Amaryl. 3.  Essential hypertension: Restart Toprol-XL 50 mg daily. 4.  GERD: Continue PPIs. 5.  History of depression: Continue Effexor, trazodone.  All the records are reviewed and case discussed with ED provider. Management plans discussed with the patient, family and they are in agreement.  CODE STATUS: full code  TOTAL TIME TAKING CARE OF THIS PATIENT: 72minutes.    Epifanio Lesches M.D on 07/30/2017 at 4:41 PM  Between 7am to 6pm - Pager - [phone]  After 6pm go to [email or web address] - password [email or web address] [facility]  Office  [phone]  CC: Primary care physician; Penne Lash, Deanne Coffer, MD  Note: This dictation was prepared with Dragon dictation along with smaller phrase technology. Any transcriptional errors that result from this process are unintentional.

## 2017-07-30 NOTE — ED Notes (Signed)
Pt back from CT. Pending bed assignment.

## 2017-07-30 NOTE — Progress Notes (Signed)
   07/30/17 1325  Clinical Encounter Type  Visited With Patient not available  Visit Type Code  Spiritual Encounters  Spiritual Needs Prayer   Chaplain responded to code stroke, offered silent prayer, maintained pastoral presence.  Patient spouse engaged with care team.  Chaplain responded to another page and will circle back.

## 2017-07-30 NOTE — ED Triage Notes (Signed)
Pt to ED reporting severe headache and difficulty with speech starting at 1130 this morning. Pt reports dizziness when standing and noted left sided weakness in triage to left arm and leg. Pt has hx of TIA last year. No blood thinners per pts wife. Pts wife also reports elevated BP noted yesterday. With reports of nausea. Pt is sure in triage that he felt normal upon waking this morning.

## 2017-07-31 ENCOUNTER — Observation Stay: Payer: BLUE CROSS/BLUE SHIELD

## 2017-07-31 ENCOUNTER — Observation Stay
Admit: 2017-07-31 | Discharge: 2017-07-31 | Disposition: A | Discharge status: Home / Self Care | Payer: BLUE CROSS/BLUE SHIELD | Attending: Internal Medicine | Admitting: Internal Medicine

## 2017-07-31 LAB — GLUCOSE, CAPILLARY
Glucose-Capillary: 155 mg/dL — ABNORMAL HIGH (ref 65–99)
Glucose-Capillary: 191 mg/dL — ABNORMAL HIGH (ref 65–99)
Glucose-Capillary: 215 mg/dL — ABNORMAL HIGH (ref 65–99)
Glucose-Capillary: 273 mg/dL — ABNORMAL HIGH (ref 65–99)

## 2017-07-31 LAB — HEMOGLOBIN A1C
Hgb A1c MFr Bld: 6.4 % — ABNORMAL HIGH (ref 4.8–5.6)
Mean Plasma Glucose: 136.98 mg/dL

## 2017-07-31 LAB — ECHOCARDIOGRAM COMPLETE
Height: 66 in
Weight: 2864 oz

## 2017-07-31 LAB — LIPID PANEL
Cholesterol: 149 mg/dL (ref 0–200)
HDL: 27 mg/dL — ABNORMAL LOW (ref >40)
LDL Cholesterol: 62 mg/dL (ref 0–99)
Total CHOL/HDL Ratio: 5.5 RATIO
Triglycerides: 300 mg/dL — ABNORMAL HIGH (ref <150)
VLDL: 60 mg/dL — ABNORMAL HIGH (ref 0–40)

## 2017-07-31 MED ORDER — MAGNESIUM SULFATE 2 GM/50ML IV SOLN
2.0000 g | Freq: Once | INTRAVENOUS | Status: AC
  Administered 2017-07-31: 17:00:00 2 g via INTRAVENOUS
  Filled 2017-07-31: qty 50

## 2017-07-31 MED ORDER — DEXAMETHASONE SODIUM PHOSPHATE 10 MG/ML IJ SOLN: 10.0000 mg | Freq: Once | INTRAMUSCULAR | Status: DC

## 2017-07-31 MED ORDER — KETOROLAC TROMETHAMINE 30 MG/ML IJ SOLN
30.0000 mg | Freq: Once | INTRAMUSCULAR | Status: AC
  Administered 2017-07-31: 30 mg via INTRAVENOUS
  Filled 2017-07-31: qty 1

## 2017-07-31 MED ORDER — DEXAMETHASONE SODIUM PHOSPHATE 10 MG/ML IJ SOLN
10.0000 mg | Freq: Once | INTRAMUSCULAR | Status: AC
  Administered 2017-07-31: 14:00:00 10 mg via INTRAVENOUS
  Filled 2017-07-31: qty 1

## 2017-07-31 MED ORDER — VENLAFAXINE HCL ER 75 MG PO CP24
75.0000 mg | ORAL_CAPSULE | Freq: Every day | ORAL | Status: DC
  Administered 2017-07-31: 75 mg via ORAL
  Filled 2017-07-31 (×2): qty 1

## 2017-07-31 MED ORDER — AMITRIPTYLINE HCL 10 MG PO TABS
10.0000 mg | ORAL_TABLET | Freq: Every day | ORAL | Status: DC
  Administered 2017-07-31: 22:00:00 10 mg via ORAL
  Filled 2017-07-31 (×2): qty 1

## 2017-07-31 NOTE — Progress Notes (Signed)
[place] [facility] [place] NAME: Jon [name]    MR#:  [id number]  DATE OF BIRTH:  Jan 01, 1951  SUBJECTIVE:  CHIEF COMPLAINT:   Chief Complaint  Patient presents with  . Code Stroke   Continues to have headache and unsteadiness.  Similar symptoms in the past which resolved quickly.  REVIEW OF SYSTEMS:    Review of Systems  Constitutional: Negative for chills and fever.  HENT: Negative for sore throat.   Eyes: Negative for blurred vision, double vision and pain.  Respiratory: Negative for cough, hemoptysis, shortness of breath and wheezing.   Cardiovascular: Negative for chest pain, palpitations, orthopnea and leg swelling.  Gastrointestinal: Negative for abdominal pain, constipation, diarrhea, heartburn, nausea and vomiting.  Genitourinary: Negative for dysuria and hematuria.  Musculoskeletal: Negative for back pain and joint pain.  Skin: Negative for rash.  Neurological: Positive for dizziness and headaches. Negative for sensory change, speech change and focal weakness.  Endo/Heme/Allergies: Does not bruise/bleed easily.  Psychiatric/Behavioral: Negative for depression. The patient is not nervous/anxious.     DRUG ALLERGIES:   Allergies  Allergen Reactions  . Beta Adrenergic Blockers Itching  . Topiramate Nausea And Vomiting    Other reaction(s): Dizziness, Hallucination Other Reaction: violence  . Lisinopril Itching    Other reaction(s): Other (See Comments) Leg pain  . Nabumetone     Other reaction(s): Other (See Comments)  . Valproic Acid     Other reaction(s): Headache    VITALS:  Blood pressure 112/72, pulse 61, temperature 98.1 F (36.7 C), temperature source Oral, resp. rate 16, height 5\' 6"  (1.676 m), weight 81.2 kg (179 lb), SpO2 97 %.  PHYSICAL EXAMINATION:   Physical Exam  GENERAL:  67 y.o.-year-old patient lying in the bed with no acute distress.  EYES: Pupils equal, round, reactive to light and accommodation.  No scleral icterus. Extraocular muscles intact.  HEENT: Head atraumatic, normocephalic. Oropharynx and nasopharynx clear.  NECK:  Supple, no jugular venous distention. No thyroid enlargement, no tenderness.  LUNGS: Normal breath sounds bilaterally, no wheezing, rales, rhonchi. No use of accessory muscles of respiration.  CARDIOVASCULAR: S1, S2 normal. No murmurs, rubs, or gallops.  ABDOMEN: Soft, nontender, nondistended. Bowel sounds present. No organomegaly or mass.  EXTREMITIES: No cyanosis, clubbing or edema b/l.    NEUROLOGIC: Cranial nerves II through XII are intact. No focal Motor or sensory deficits b/l.   PSYCHIATRIC: The patient is alert and oriented x 3.  SKIN: No obvious rash, lesion, or ulcer.   LABORATORY PANEL:   CBC Recent Labs  Lab 07/30/17 1326  WBC 7.6  HGB 15.9  HCT 47.1  PLT 235   ------------------------------------------------------------------------------------------------------------------ Chemistries  Recent Labs  Lab 07/30/17 1326  NA 136  K 4.0  CL 103  CO2 23  GLUCOSE 148*  BUN 13  CREATININE 0.99  CALCIUM 9.7  AST 29  ALT 27  ALKPHOS 72  BILITOT 1.0   ------------------------------------------------------------------------------------------------------------------  Cardiac Enzymes Recent Labs  Lab 07/30/17 1326  TROPONINI <0.03   ------------------------------------------------------------------------------------------------------------------  RADIOLOGY:  Ct Angio Head W Or Wo Contrast  Result Date: 07/30/2017 CLINICAL DATA:  67 y/o M; severe headache and speech difficulty. History of pseudoaneurysm. EXAM: CT ANGIOGRAPHY HEAD AND NECK TECHNIQUE: Multidetector CT imaging of the head and neck was performed using the standard protocol during bolus administration of intravenous contrast. Multiplanar CT image reconstructions and MIPs were obtained to evaluate the vascular anatomy. Carotid stenosis measurements (when applicable) are obtained  utilizing NASCET criteria,  using the distal internal carotid diameter as the denominator. CONTRAST:  49mL ISOVUE-370 IOPAMIDOL (ISOVUE-370) INJECTION 76% COMPARISON:  07/30/2017 CT head.  06/15/2006 MRI/MRA head. FINDINGS: CTA NECK FINDINGS Aortic arch: Standard branching. Imaged portion shows no evidence of aneurysm or dissection. 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 Right carotid system: No evidence of dissection, stenosis (50% or greater) or occlusion. Left carotid system: No evidence of dissection, stenosis (50% or greater) or occlusion. Vertebral arteries: Outpouching of the vessel wall with flap involving left subclavian artery extends to the left vertebral artery origin there is calcified plaque resulting in moderate left vertebral artery origin stenosis. Right dominant vertebral artery. The arteries in the neck or otherwise patent without dissection or stenosis. Skeleton: Moderate cervical spondylosis greatest at the C5-C7 levels or uncovertebral and facet hypertrophy result in left-greater-than-right foraminal encroachment. No high-grade canal stenosis. Other neck: Negative. Upper chest: Negative. Review of the MIP images confirms the above findings CTA HEAD FINDINGS Anterior circulation: No significant stenosis, proximal occlusion, or aneurysm. Mild non stenotic calcific atherosclerosis of carotid siphons. Blush of enhancement measuring 14 x 18 x 15 mm (AP x ML x CC series 6, image 266 and series 7, image 130) with prominent draining veins extending into an enlarged left cortical vein which extends over the left frontal lobe to the superior sagittal sinus anteriorly. Posterior circulation: No significant stenosis, proximal occlusion, aneurysm, or vascular malformation. Venous sinuses: As permitted by contrast timing, patent. Anatomic variants: Diminutive anterior  communicating artery. Posterior communicating artery not identified, likely hypoplastic or absent. Delayed phase: Blush of enhancement in the left superolateral frontal lobe measuring 14 x 18 x 15 mm (AP x ML x CC series 6, image 266 and series 7, image 130) with prominent draining veins extending into an enlarged left cortical vein which extends over the left frontal lobe to the superior sagittal sinus anteriorly. Review of the MIP images confirms the above findings IMPRESSION: CTA neck: 1. Left subclavian artery vessel wall outpouching with flap possibly representing pseudoaneurysm or focal dissection measuring up to 19 mm extending to left vertebral artery origin. 2. Moderate left vertebral artery origin stenosis. 3. No significant stenosis, occlusion, or dissection of bilateral carotid systems and right vertebral artery. CTA head: 1. No large vessel occlusion, aneurysm, or significant stenosis of the anterior or posterior intracranial circulation. 2. 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 These results were called by telephone at the time of interpretation on 07/30/2017 at 3:15 pm to Dr. Conni Slipper , who verbally acknowledged these results. Electronically Signed   By: Kristine Garbe M.D.   On: 07/30/2017 15:18   Ct Angio Neck W And/or Wo Contrast  Result Date: 07/30/2017 CLINICAL DATA:  67 y/o M; severe headache and speech difficulty. History of pseudoaneurysm. EXAM: CT ANGIOGRAPHY HEAD AND NECK TECHNIQUE: Multidetector CT imaging of the head and neck was performed using the standard protocol during bolus administration of intravenous contrast. Multiplanar CT image reconstructions and MIPs were obtained to evaluate the vascular anatomy. Carotid stenosis measurements (when applicable) are obtained utilizing  NASCET criteria, using the distal internal carotid diameter as the denominator. CONTRAST:  52mL ISOVUE-370 IOPAMIDOL (ISOVUE-370) INJECTION 76% COMPARISON:  07/30/2017 CT head.  06/15/2006 MRI/MRA head. FINDINGS: CTA NECK FINDINGS Aortic arch: Standard branching. Imaged portion shows no evidence of aneurysm or dissection. 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 Right carotid system: No evidence of dissection, stenosis (50% or greater) or occlusion. Left carotid system: No evidence of dissection, stenosis (50% or greater) or occlusion. Vertebral arteries: Outpouching of the vessel wall with flap involving left subclavian artery extends to the left vertebral artery origin there is calcified plaque resulting in moderate left vertebral artery origin stenosis. Right dominant vertebral artery. The arteries in the neck or otherwise patent without dissection or stenosis. Skeleton: Moderate cervical spondylosis greatest at the C5-C7 levels or uncovertebral and facet hypertrophy result in left-greater-than-right foraminal encroachment. No high-grade canal stenosis. Other neck: Negative. Upper chest: Negative. Review of the MIP images confirms the above findings CTA HEAD FINDINGS Anterior circulation: No significant stenosis, proximal occlusion, or aneurysm. Mild non stenotic calcific atherosclerosis of carotid siphons. Blush of enhancement measuring 14 x 18 x 15 mm (AP x ML x CC series 6, image 266 and series 7, image 130) with prominent draining veins extending into an enlarged left cortical vein which extends over the left frontal lobe to the superior sagittal sinus anteriorly. Posterior circulation: No significant stenosis, proximal occlusion, aneurysm, or vascular malformation. Venous sinuses: As permitted by contrast timing, patent. Anatomic variants: Diminutive anterior communicating  artery. Posterior communicating artery not identified, likely hypoplastic or absent. Delayed phase: Blush of enhancement in the left superolateral frontal lobe measuring 14 x 18 x 15 mm (AP x ML x CC series 6, image 266 and series 7, image 130) with prominent draining veins extending into an enlarged left cortical vein which extends over the left frontal lobe to the superior sagittal sinus anteriorly. Review of the MIP images confirms the above findings IMPRESSION: CTA neck: 1. Left subclavian artery vessel wall outpouching with flap possibly representing pseudoaneurysm or focal dissection measuring up to 19 mm extending to left vertebral artery origin. 2. Moderate left vertebral artery origin stenosis. 3. No significant stenosis, occlusion, or dissection of bilateral carotid systems and right vertebral artery. CTA head: 1. No large vessel occlusion, aneurysm, or significant stenosis of the anterior or posterior intracranial circulation. 2. 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 These results were called by telephone at the time of interpretation on 07/30/2017 at 3:15 pm to Dr. Conni Slipper , who verbally acknowledged these results. Electronically Signed   By: Kristine Garbe M.D.   On: 07/30/2017 15:18   Mr Brain Wo Contrast  Result Date: 07/31/2017 CLINICAL DATA:  TIA, initial exam. Headache. 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 Episode of slurred speech and word-finding difficulties. EXAM: MRI HEAD WITHOUT CONTRAST TECHNIQUE: Multiplanar, multiecho pulse sequences of the brain and surrounding structures were obtained without intravenous contrast. COMPARISON:  CT head and CTA head and neck 07/30/2017. MRI brain 06/15/2006 FINDINGS: Brain: Maximal dimension of the pituitary  is 13 mm. This is increased from 10.5 mm in 2008. Moderate is periventricular and subcortical white matter changes are present bilaterally. There is no significant encephalomalacia associated with the area of suspected AVM in the left temporal lobe prominent flow voids are evident. No acute  infarct or hemorrhage is present. Susceptibility in the anterior left frontal lobe likely reflects remote hemorrhagic ischemia. The ventricles are of normal size. No significant extra-axial fluid collection is present. 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 Brainstem and cerebellum are otherwise normal. The internal auditory canals are normal bilaterally. Vascular: Flow is present in the major intracranial arteries. Prominent flow voids are present in the left temporal region. Skull and upper cervical spine: The skull base is within normal limits. The craniocervical junction is normal. The upper cervical spine is unremarkable. Sinuses/Orbits: The paranasal sinuses and mastoid air cells are clear. Globes and orbits are within normal limits. IMPRESSION: 1. No acute or subacute infarct to explain the patient's stroke like symptoms. 2. Atrophy and white matter disease is moderately advanced for age. This likely reflects the sequela of chronic microvascular ischemia. 3. 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 4. Focal susceptibility in the right frontal lobe measuring 8 mm is likely related to remote hemorrhagic ischemia. Electronically Signed   By: San Morelle M.D.   On: 07/31/2017 09:06   Ct Head Code Stroke Wo Contrast  Result Date: 07/30/2017 CLINICAL DATA:  Code stroke. 67 y/o M; severe headache and difficulty with speech. Left-sided weakness. History of TIA. EXAM: CT HEAD WITHOUT CONTRAST TECHNIQUE: Contiguous axial images were obtained from the base of the skull through the vertex without intravenous contrast. COMPARISON:  06/13/2006 CT head  FINDINGS: Brain: No evidence of acute infarction, hemorrhage, hydrocephalus, extra-axial collection or mass lesion/mass effect. Nonspecific foci of hypoattenuation in subcortical and periventricular white matter compatible with chronic microvascular ischemic changes and mildly progressed from 2008. There are very small chronic infarcts within the cerebellar hemispheres bilaterally and mild brain parenchymal volume loss. Vascular: Calcific atherosclerosis of carotid siphons. No hyperdense vessel identified. Skull: Normal. Negative for fracture or focal lesion. Sinuses/Orbits: Bilateral intra-ocular lens replacement. Otherwise negative. Other: None. ASPECTS [facility] Stroke Program Early CT Score) - Ganglionic level infarction (caudate, lentiform nuclei, internal capsule, insula, M1-M3 cortex): 7 - Supraganglionic infarction (M4-M6 cortex): 3 Total score (0-10 with 10 being normal): 10 IMPRESSION: 1. No acute intracranial. 2. Mild interval progression of chronic microvascular ischemic changes and parenchymal volume loss of the brain. 3. ASPECTS is 10 These results were called by telephone at the time of interpretation on 07/30/2017 at 1:32 pm to Dr. Conni Slipper , who verbally acknowledged these results. Electronically Signed   By: Kristine Garbe M.D.   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 MRI shows no acute CVA.  Discussed with Dr. Irish Elders of neurology. We will give patient Toradol, Decadron, magnesium.  Start Elavil.  Patient has been on Effexor.  He has reduced the dose on his own to half at home.  Will reduce the dose in the hospital.  Patient wants to stop taking this medication.  Tapering.  *Hypertension.  Continue home medications.  *Diabetes mellitus.  Likely will be uncontrolled due to IV steroid dose.  Sliding scale insulin.   All the records are reviewed and case discussed with Care Management/Social  Worker Management plans discussed with the patient, family and they are in agreement.  CODE STATUS: FULL CODE  DVT Prophylaxis: SCDs  TOTAL TIME TAKING CARE OF THIS PATIENT: 35 minutes.   POSSIBLE D/C IN 1-2 DAYS, DEPENDING ON CLINICAL CONDITION.  Neita Carp M.D on 07/31/2017 at 2:22 PM  Between 7am to 6pm - Pager - [phone]  After 6pm go to [email or web address] - password [email or web address] [facility]  Office  [phone]  CC: Primary care physician; Penne Lash, Deanne Coffer, MD  Note: This dictation was prepared with Dragon dictation along with smaller phrase technology. Any transcriptional errors that result from this process are unintentional.

## 2017-07-31 NOTE — Evaluation (Signed)
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  PMHx:  pituitary tumor, HTN, HLD,   Clinical Impression  Pt is up to walk with PT and note his instability with strong listing of gait, along with tremors of mm's with resisted testing in flexion pattern group.  He is more tolerant of gait with RW, but requires shading of light to reduce his irritation of HA.  Have met with pt along with wife who arrived during the evaluation, and she agrees with pt that inpt therapy would be a preferred treatment now.  He is having episodes with symptoms, last one was Dec 2018, but now much more dramatic.  Will follow acutely for treatment of his weakness and ataxia, and will recommend inpatient acute rehab for more intensive protocol to address his issues.    Follow Up Recommendations CIR(prefers to use [facility] as his wife works there)    Clinical biochemist with 5" wheels    Recommendations for Other Services       Precautions / Restrictions Precautions Precautions: Fall(telemetry) Restrictions Weight Bearing Restrictions: No      Mobility  Bed Mobility Overal bed mobility: Modified Independent             General bed mobility comments: needs extra time and uses bed rail  Transfers Overall transfer level: Needs assistance Equipment used: 1 person hand held assist Transfers: Sit to/from Stand Sit to Stand: Min assist;Mod assist         General transfer comment: needs mod to initiate power up then min to steady at [employer]  Ambulation/Gait Ambulation/Gait assistance: Min assist;Mod assist Ambulation Distance (Feet): 150 Feet Assistive device: Rolling walker  (2 wheeled);1 person hand held assist Gait Pattern/deviations: Step-through pattern;Wide base of support;Drifts right/left;Staggering right;Staggering left;Leaning posteriorly;Decreased stride length Gait velocity: reduced Gait velocity interpretation: Below normal speed for age/gender General Gait Details: Has a strong list backward or laterally, then for a few steps is more steady.  Widens base to increase stability  Stairs            Wheelchair Mobility    Modified Rankin (Stroke Patients Only)       Balance Overall balance assessment: Needs assistance Sitting-balance support: Feet supported Sitting balance-Leahy Scale: Good   Postural control: Posterior lean;Right lateral lean;Left lateral lean Standing balance support: Bilateral upper extremity supported;During functional activity Standing balance-Leahy Scale: Poor Standing balance comment: needs RW to steady along with belt and holding from therapist, more important with dynamic standing                             Pertinent Vitals/Pain Pain Assessment: 0-10 Pain Score: 5  Pain Location: HA Pain Descriptors / Indicators: Headache Pain Intervention(s): Limited activity within patient's tolerance;Monitored during session;Premedicated before session;Other (comment)(photophobia, used his hat to shield eyes)    Home Living Family/patient expects to be discharged to:: Private residence Living Arrangements: Spouse/significant other Available Help at Discharge: Family;Available PRN/intermittently Type of Home: House Home Access: Stairs to enter   Entrance Stairs-Number of Steps: 1 Home Layout: One level Home Equipment: Walker - 2 wheels;Cane - single point      Prior Function Level of  Independence: Independent         Comments: 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, newspaper   Dominant Hand: Right    Extremity/Trunk Assessment   Upper Extremity Assessment Upper Extremity Assessment:  Overall WFL for tasks assessed    Lower Extremity Assessment Lower Extremity Assessment: Generalized weakness(has tremors in mm's in flexion pattern with resisted mm test)    Cervical / Trunk Assessment Cervical / Trunk Assessment: Normal  Communication   Communication: No difficulties  Cognition Arousal/Alertness: Awake/alert Behavior During Therapy: WFL for tasks assessed/performed Overall Cognitive Status: Within Functional Limits for tasks assessed                                        General Comments      Exercises     Assessment/Plan    PT Assessment Patient needs continued PT services  PT Problem List Decreased strength;Decreased activity tolerance;Decreased balance;Decreased mobility;Decreased coordination;Decreased knowledge of use of DME;Decreased safety awareness;Cardiopulmonary status limiting activity;Pain       PT Treatment Interventions DME instruction;Gait training;Functional mobility training;Therapeutic activities;Therapeutic exercise;Balance training;Neuromuscular re-education;Patient/family education    PT Goals (Current goals can be found in the Care Plan section)  Acute Rehab PT Goals Patient Stated Goal: to get HA managed and get home PT Goal Formulation: With patient/family Time For Goal Achievement: 08/14/17 Potential to Achieve Goals: Good    Frequency Min 2X/week   Barriers to discharge Decreased caregiver support home alone during the day    Co-evaluation               AM-PAC PT "6 Clicks" Daily Activity  Outcome Measure Difficulty turning over in bed (including adjusting bedclothes, sheets and blankets)?: None Difficulty moving from lying on back to sitting on the side of the bed? : A Little Difficulty sitting down on and standing up from a chair with arms (e.g., wheelchair, bedside commode, etc,.)?: Unable Help needed moving to and from a bed to chair (including a wheelchair)?: A Lot Help needed walking in  hospital room?: A Lot Help needed climbing 3-5 steps with a railing? : Total 6 Click Score: 13    End of Session Equipment Utilized During Treatment: Gait belt Activity Tolerance: Patient limited by fatigue;Treatment limited secondary to medical complications (Comment)(tone and instability with gait) Patient left: in bed;with call bell/phone within reach;with family/visitor present(back to bed due to HA) Nurse Communication: Mobility status;Other (comment)(plan for dc) PT Visit Diagnosis: Unsteadiness on feet (R26.81);Muscle weakness (generalized) (M62.81);Ataxic gait (R26.0);Other symptoms and signs involving the nervous system (R29.898);Pain Pain - Right/Left: (B) Pain - part of body: (HA)    Time: 6378-5885 PT Time Calculation (min) (ACUTE ONLY): 32 min   Charges:   PT Evaluation $PT Eval Moderate Complexity: 1 Mod PT Treatments $Gait Training: 8-22 mins   PT G Codes:   PT G-Codes **NOT FOR INPATIENT CLASS** Functional Assessment Tool Used: AM-PAC 6 Clicks Basic Mobility    Ramond Dial 07/31/2017, 12:41 PM  Mee Hives, PT MS Acute Rehab Dept. Number: [place] [facility] [place]

## 2017-07-31 NOTE — Plan of Care (Signed)
Patient resting well. Here for stroke work-up. NIH this shift is scored at 2. Safety and fall precaution measures in place.  Pending MRI, Neuro consult and echocardiogram.

## 2017-07-31 NOTE — Consult Note (Signed)
Reason for Consult: headache  Referring Physician: Dr. Darvin Neighbours   CC: headache   HPI: Jon [name] is an 67 y.o. male with a known history of pituitary tumor comes in because of hypertension, hyperlipidemia came in because of slurred speech, word finding difficulty. Initial BP was 160/90 at home and he also noted to have difficulty getting the correct words out. Pt states he has chronic headaches and is currently on flexeril and tylenol with minimal relief. Follows at [facility] with neurosurgery.  Headache R frontal 10/10 not relieved over the counter medications.  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      History reviewed. No pertinent past medical history.   No family history on file.  Social History:  has no tobacco, alcohol, and drug history on file.  Allergies  Allergen Reactions  . Beta Adrenergic Blockers Itching  . Topiramate Nausea And Vomiting    Other reaction(s): Dizziness, Hallucination Other Reaction: violence  . Lisinopril Itching    Other reaction(s): Other (See Comments) Leg pain  . Nabumetone     Other reaction(s): Other (See Comments)  . Valproic Acid     Other reaction(s): Headache    Medications: I have reviewed the patient's current medications.  ROS: History obtained from the patient  General ROS: negative for - chills, fatigue, fever, night sweats, weight gain or weight loss Psychological ROS: negative for - behavioral disorder, hallucinations, memory difficulties, mood swings or suicidal ideation Ophthalmic ROS: negative for - blurry vision, double vision, eye pain or loss of vision ENT ROS: negative for - epistaxis, nasal discharge, oral lesions, sore throat, tinnitus or vertigo Allergy and Immunology ROS: negative for - hives or itchy/watery eyes Hematological and Lymphatic ROS: negative for - bleeding problems, bruising or swollen lymph nodes Endocrine ROS: negative for - galactorrhea, hair pattern  changes, polydipsia/polyuria or temperature intolerance Respiratory ROS: negative for - cough, hemoptysis, shortness of breath or wheezing Cardiovascular ROS: negative for - chest pain, dyspnea on exertion, edema or irregular heartbeat Gastrointestinal ROS: negative for - abdominal pain, diarrhea, hematemesis, nausea/vomiting or stool incontinence Genito-Urinary ROS: negative for - dysuria, hematuria, incontinence or urinary frequency/urgency Musculoskeletal ROS: negative for - joint swelling or muscular weakness Neurological ROS: as noted in HPI Dermatological ROS: negative for rash and skin lesion changes  Physical Examination: Blood pressure 112/72, pulse 61, temperature 98.1 F (36.7 C), temperature source Oral, resp. rate 16, height 5\' 6"  (1.676 m), weight 179 lb (81.2 kg), SpO2 97 %.  HEENT-  Normocephalic, no lesions, without obvious abnormality.  Normal external eye and conjunctiva.  Normal TM's bilaterally.  Normal auditory canals and external ears. Normal external nose, mucus membranes and septum.  Normal pharynx. Cardiovascular- regular rate and rhythm, S1, S2 normal, no murmur, click, rub or gallop, pulses palpable throughout   Lungs- Heart exam - S1, S2 normal, no murmur, no gallop, rate regular Abdomen- soft, non-tender; bowel sounds normal; no masses,  no organomegaly Extremities- less then 2 second capillary refill Lymph-no adenopathy palpable Musculoskeletal-no joint tenderness, deformity or swelling Skin-warm and dry, no hyperpigmentation, vitiligo, or suspicious lesions  Neurological Examination   Mental Status: Alert, oriented, thought content appropriate.  Speech fluent without evidence of aphasia.  Able to follow 3 step commands without difficulty. Cranial Nerves: II: Discs flat bilaterally; Visual fields grossly normal, pupils equal, round, reactive to light and accommodation III,IV, VI: ptosis not present, extra-ocular motions intact bilaterally V,VII: smile  symmetric, facial light touch sensation normal  bilaterally VIII: hearing normal bilaterally IX,X: gag reflex present XI: bilateral shoulder shrug XII: midline tongue extension Motor: Right : Upper extremity   5/5    Left:     Upper extremity   5/5  Lower extremity   5/5     Lower extremity   5/5 Tone and bulk:normal tone throughout; no atrophy noted Sensory: Pinprick and light touch intact throughout, bilaterally Deep Tendon Reflexes: 2+ and symmetric throughout Plantars: Right: downgoing   Left: downgoing Cerebellar: normal finger-to-nose, normal rapid alternating movements and normal heel-to-shin test Gait: not tested      Laboratory Studies:   Basic Metabolic Panel: Recent Labs  Lab 07/30/17 1326  NA 136  K 4.0  CL 103  CO2 23  GLUCOSE 148*  BUN 13  CREATININE 0.99  CALCIUM 9.7    Liver Function Tests: Recent Labs  Lab 07/30/17 1326  AST 29  ALT 27  ALKPHOS 72  BILITOT 1.0  PROT 7.9  ALBUMIN 4.5   No results for input(s): LIPASE, AMYLASE in the last 168 hours. No results for input(s): AMMONIA in the last 168 hours.  CBC: Recent Labs  Lab 07/30/17 1326  WBC 7.6  NEUTROABS 5.2  HGB 15.9  HCT 47.1  MCV 91.4  PLT 235    Cardiac Enzymes: Recent Labs  Lab 07/30/17 1326  TROPONINI <0.03    BNP: Invalid input(s): POCBNP  CBG: Recent Labs  Lab 07/30/17 1327 07/30/17 1804 07/30/17 2213 07/31/17 0753 07/31/17 1207  GLUCAP 141* 90 145* 155* 191*    Microbiology: No results found for this or any previous visit.  Coagulation Studies: Recent Labs    07/30/17 1326  LABPROT 12.1  INR 0.90    Urinalysis:  Recent Labs  Lab 07/30/17 1314  COLORURINE YELLOW*  LABSPEC 1.020  PHURINE 7.0  GLUCOSEU NEGATIVE  HGBUR NEGATIVE  BILIRUBINUR NEGATIVE  KETONESUR NEGATIVE  PROTEINUR NEGATIVE  NITRITE NEGATIVE  LEUKOCYTESUR NEGATIVE    Lipid Panel:     Component Value Date/Time   CHOL 149 07/31/2017 0402   TRIG 300 (H) 07/31/2017 0402    HDL 27 (L) 07/31/2017 0402   CHOLHDL 5.5 07/31/2017 0402   VLDL 60 (H) 07/31/2017 0402   LDLCALC 62 07/31/2017 0402    HgbA1C:  Lab Results  Component Value Date   HGBA1C 6.4 (H) 07/31/2017    Urine Drug Screen:      Component Value Date/Time   LABOPIA POSITIVE (A) 07/30/2017 1314   COCAINSCRNUR NONE DETECTED 07/30/2017 1314   LABBENZ NONE DETECTED 07/30/2017 1314   AMPHETMU NONE DETECTED 07/30/2017 1314   THCU NONE DETECTED 07/30/2017 1314   LABBARB POSITIVE (A) 07/30/2017 1314    Alcohol Level:  Recent Labs  Lab 07/30/17 1326  ETH <10      Imaging: Ct Angio Head W Or Wo Contrast  Result Date: 07/30/2017 CLINICAL DATA:  67 y/o M; severe headache and speech difficulty. History of pseudoaneurysm. EXAM: CT ANGIOGRAPHY HEAD AND NECK TECHNIQUE: Multidetector CT imaging of the head and neck was performed using the standard protocol during bolus administration of intravenous contrast. Multiplanar CT image reconstructions and MIPs were obtained to evaluate the vascular anatomy. Carotid stenosis measurements (when applicable) are obtained utilizing NASCET criteria, using the distal internal carotid diameter as the denominator. CONTRAST:  39mL ISOVUE-370 IOPAMIDOL (ISOVUE-370) INJECTION 76% COMPARISON:  07/30/2017 CT head.  06/15/2006 MRI/MRA head. FINDINGS: CTA NECK FINDINGS Aortic arch: Standard branching. Imaged portion shows no evidence of aneurysm or dissection. Focal dissection/penetrating ulcer directed  superiorly from the left subclavian artery measuring 19 x 13 x 9 mm (AP x ML x CC series 7, image 156 and series 8, image 128). Right carotid system: No evidence of dissection, stenosis (50% or greater) or occlusion. Left carotid system: No evidence of dissection, stenosis (50% or greater) or occlusion. Vertebral arteries: Outpouching of the vessel wall with flap involving left subclavian artery extends to the left vertebral artery origin there is calcified plaque resulting in  moderate left vertebral artery origin stenosis. Right dominant vertebral artery. The arteries in the neck or otherwise patent without dissection or stenosis. Skeleton: Moderate cervical spondylosis greatest at the C5-C7 levels or uncovertebral and facet hypertrophy result in left-greater-than-right foraminal encroachment. No high-grade canal stenosis. Other neck: Negative. Upper chest: Negative. Review of the MIP images confirms the above findings CTA HEAD FINDINGS Anterior circulation: No significant stenosis, proximal occlusion, or aneurysm. Mild non stenotic calcific atherosclerosis of carotid siphons. Blush of enhancement measuring 14 x 18 x 15 mm (AP x ML x CC series 6, image 266 and series 7, image 130) with prominent draining veins extending into an enlarged left cortical vein which extends over the left frontal lobe to the superior sagittal sinus anteriorly. Posterior circulation: No significant stenosis, proximal occlusion, aneurysm, or vascular malformation. Venous sinuses: As permitted by contrast timing, patent. Anatomic variants: Diminutive anterior communicating artery. Posterior communicating artery not identified, likely hypoplastic or absent. Delayed phase: Blush of enhancement in the left superolateral frontal lobe measuring 14 x 18 x 15 mm (AP x ML x CC series 6, image 266 and series 7, image 130) with prominent draining veins extending into an enlarged left cortical vein which extends over the left frontal lobe to the superior sagittal sinus anteriorly. Review of the MIP images confirms the above findings IMPRESSION: CTA neck: 1. Left subclavian artery vessel wall outpouching with flap possibly representing pseudoaneurysm or focal dissection measuring up to 19 mm extending to left vertebral artery origin. 2. Moderate left vertebral artery origin stenosis. 3. No significant stenosis, occlusion, or dissection of bilateral carotid systems and right vertebral artery. CTA head: 1. No large vessel  occlusion, aneurysm, or significant stenosis of the anterior or posterior intracranial circulation. 2. 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 These results were called by telephone at the time of interpretation on 07/30/2017 at 3:15 pm to Dr. Conni Slipper , who verbally acknowledged these results. Electronically Signed   By: Kristine Garbe M.D.   On: 07/30/2017 15:18   Ct Angio Neck W And/or Wo Contrast  Result Date: 07/30/2017 CLINICAL DATA:  67 y/o M; severe headache and speech difficulty. History of pseudoaneurysm. EXAM: CT ANGIOGRAPHY HEAD AND NECK TECHNIQUE: Multidetector CT imaging of the head and neck was performed using the standard protocol during bolus administration of intravenous contrast. Multiplanar CT image reconstructions and MIPs were obtained to evaluate the vascular anatomy. Carotid stenosis measurements (when applicable) are obtained utilizing NASCET criteria, using the distal internal carotid diameter as the denominator. CONTRAST:  45mL ISOVUE-370 IOPAMIDOL (ISOVUE-370) INJECTION 76% COMPARISON:  07/30/2017 CT head.  06/15/2006 MRI/MRA head. FINDINGS: CTA NECK FINDINGS Aortic arch: Standard branching. Imaged portion shows no evidence of aneurysm or dissection. Focal dissection/penetrating ulcer directed superiorly from the left subclavian artery measuring 19 x 13 x 9 mm (AP x ML x CC series 7, image 156  and series 8, image 128). Right carotid system: No evidence of dissection, stenosis (50% or greater) or occlusion. Left carotid system: No evidence of dissection, stenosis (50% or greater) or occlusion. Vertebral arteries: Outpouching of the vessel wall with flap involving left subclavian artery extends to the left vertebral artery origin there is calcified plaque resulting in moderate left  vertebral artery origin stenosis. Right dominant vertebral artery. The arteries in the neck or otherwise patent without dissection or stenosis. Skeleton: Moderate cervical spondylosis greatest at the C5-C7 levels or uncovertebral and facet hypertrophy result in left-greater-than-right foraminal encroachment. No high-grade canal stenosis. Other neck: Negative. Upper chest: Negative. Review of the MIP images confirms the above findings CTA HEAD FINDINGS Anterior circulation: No significant stenosis, proximal occlusion, or aneurysm. Mild non stenotic calcific atherosclerosis of carotid siphons. Blush of enhancement measuring 14 x 18 x 15 mm (AP x ML x CC series 6, image 266 and series 7, image 130) with prominent draining veins extending into an enlarged left cortical vein which extends over the left frontal lobe to the superior sagittal sinus anteriorly. Posterior circulation: No significant stenosis, proximal occlusion, aneurysm, or vascular malformation. Venous sinuses: As permitted by contrast timing, patent. Anatomic variants: Diminutive anterior communicating artery. Posterior communicating artery not identified, likely hypoplastic or absent. Delayed phase: Blush of enhancement in the left superolateral frontal lobe measuring 14 x 18 x 15 mm (AP x ML x CC series 6, image 266 and series 7, image 130) with prominent draining veins extending into an enlarged left cortical vein which extends over the left frontal lobe to the superior sagittal sinus anteriorly. Review of the MIP images confirms the above findings IMPRESSION: CTA neck: 1. Left subclavian artery vessel wall outpouching with flap possibly representing pseudoaneurysm or focal dissection measuring up to 19 mm extending to left vertebral artery origin. 2. Moderate left vertebral artery origin stenosis. 3. No significant stenosis, occlusion, or dissection of bilateral carotid systems and right vertebral artery. CTA head: 1. No large vessel occlusion,  aneurysm, or significant stenosis of the anterior or posterior intracranial circulation. 2. 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 These results were called by telephone at the time of interpretation on 07/30/2017 at 3:15 pm to Dr. Conni Slipper , who verbally acknowledged these results. Electronically Signed   By: Kristine Garbe M.D.   On: 07/30/2017 15:18   Mr Brain Wo Contrast  Result Date: 07/31/2017 CLINICAL DATA:  TIA, initial exam. Headache. 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 Episode of slurred speech and word-finding difficulties. EXAM: MRI HEAD WITHOUT CONTRAST TECHNIQUE: Multiplanar, multiecho pulse sequences of the brain and surrounding structures were obtained without intravenous contrast. COMPARISON:  CT head and CTA head and neck 07/30/2017. MRI brain 06/15/2006 FINDINGS: Brain: Maximal dimension of the pituitary is 13 mm. This is increased from 10.5 mm in 2008. Moderate is periventricular and subcortical white matter changes are present bilaterally. There is no significant encephalomalacia associated with the area of suspected AVM in the left temporal lobe prominent flow voids are evident. No acute infarct or hemorrhage is present. Susceptibility in the anterior left frontal lobe likely reflects remote hemorrhagic ischemia. The ventricles are of normal size. No significant extra-axial fluid collection is present. 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 Brainstem and cerebellum are otherwise normal.  The internal auditory canals are normal bilaterally. Vascular: Flow is present in the major intracranial arteries. Prominent flow voids are present in the left temporal region. Skull and upper cervical spine: The skull base is within normal  limits. The craniocervical junction is normal. The upper cervical spine is unremarkable. Sinuses/Orbits: The paranasal sinuses and mastoid air cells are clear. Globes and orbits are within normal limits. IMPRESSION: 1. No acute or subacute infarct to explain the patient's stroke like symptoms. 2. Atrophy and white matter disease is moderately advanced for age. This likely reflects the sequela of chronic microvascular ischemia. 3. 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 4. Focal susceptibility in the right frontal lobe measuring 8 mm is likely related to remote hemorrhagic ischemia. Electronically Signed   By: San Morelle M.D.   On: 07/31/2017 09:06   Ct Head Code Stroke Wo Contrast  Result Date: 07/30/2017 CLINICAL DATA:  Code stroke. 67 y/o M; severe headache and difficulty with speech. Left-sided weakness. History of TIA. EXAM: CT HEAD WITHOUT CONTRAST TECHNIQUE: Contiguous axial images were obtained from the base of the skull through the vertex without intravenous contrast. COMPARISON:  06/13/2006 CT head FINDINGS: Brain: No evidence of acute infarction, hemorrhage, hydrocephalus, extra-axial collection or mass lesion/mass effect. Nonspecific foci of hypoattenuation in subcortical and periventricular white matter compatible with chronic microvascular ischemic changes and mildly progressed from 2008. There are very small chronic infarcts within the cerebellar hemispheres bilaterally and mild brain parenchymal volume loss. Vascular: Calcific atherosclerosis of carotid siphons. No hyperdense vessel identified. Skull: Normal. Negative for fracture or focal lesion. Sinuses/Orbits: Bilateral intra-ocular lens replacement. Otherwise negative. Other: None. ASPECTS [facility] Stroke Program Early CT Score) - Ganglionic level infarction (caudate, lentiform nuclei, internal capsule, insula, M1-M3 cortex): 7 - Supraganglionic infarction (M4-M6 cortex): 3 Total score  (0-10 with 10 being normal): 10 IMPRESSION: 1. No acute intracranial. 2. Mild interval progression of chronic microvascular ischemic changes and parenchymal volume loss of the brain. 3. ASPECTS is 10 These results were called by telephone at the time of interpretation on 07/30/2017 at 1:32 pm to Dr. Conni Slipper , who verbally acknowledged these results. Electronically Signed   By: Kristine Garbe M.D.   On: 07/30/2017 13:34     Assessment/Plan:   67 y.o. male with a known history of pituitary tumor comes in because of hypertension, hyperlipidemia came in because of slurred speech, word finding difficulty. Initial BP was 160/90 at home and he also noted to have difficulty getting the correct words out. Pt states he has chronic headaches and is currently on flexeril and tylenol with minimal relief. Follows at [facility] with neurosurgery.  Headache R frontal 10/10 not relieved over the counter medications.  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    - Chronic daily headaches. Does not follow up with neurology as out pt. - Decadron 10mg  now IV - Magnesium Sulfate 2g now - started on elavil 10mg  nightly as states poor sleep and for daily headaches - Magnesium 500-600mg  and daily Vitamin B complex explained to pt and fmaily - possible d/c if symptoms improve - Needs to set up appointment with Dr. Alla Feeling. Manuella Ghazi and have headache management/follow and diary.  07/31/2017, 2:11 PM

## 2017-08-01 DIAGNOSIS — G441 Vascular headache, not elsewhere classified: Secondary | ICD-10-CM

## 2017-08-01 LAB — GLUCOSE, CAPILLARY
Glucose-Capillary: 174 mg/dL — ABNORMAL HIGH (ref 65–99)
Glucose-Capillary: 189 mg/dL — ABNORMAL HIGH (ref 65–99)

## 2017-08-01 MED ORDER — AMITRIPTYLINE HCL 10 MG PO TABS: 10.0000 mg | ORAL_TABLET | Freq: Every day | ORAL | 0 refills | Status: DC

## 2017-08-01 MED ORDER — VENLAFAXINE HCL ER 37.5 MG PO CP24: 37.5000 mg | ORAL_CAPSULE | Freq: Every day | ORAL | 0 refills | Status: DC

## 2017-08-01 NOTE — Clinical Social Work Note (Signed)
CSW received referral for SNF.  Case discussed with case manager and plan is to discharge home with home health.  CSW to sign off please re-consult if social work needs arise.  Jalan Bodi R. Claretta Kendra, MSW, LCSWA [phone]  

## 2017-08-01 NOTE — Evaluation (Signed)
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  PMHx:  pituitary tumor, HTN, HLD, DM, pituitary tumor, chronic headaches, and notable fall in 04/2015 where pt hit his head and was taken to ER (per pt report).   Clinical Impression   Pt seen for OT evaluation this date. Prior to admission, pt was independent with limited mobility and self care tasks, living in a 1 story home with his spouse. Pt endorses 6 falls in past 12 months, primarily due to a loss of balance, most often posteriorly. Pt notes that balance has been a problem since a significant fall in 04/2015 where he hit his head and was hospitalized. Currently, pt presents with 3/10 HA and slightly whoozy once HA started this morning, with it worsening with body position changes previously. Orthostatics monitored: supine BP 122/75, sitting EOB BP 126/78, standing BP 117/76, asymptomatic t/o. Pt has symmetrical WFL strength, sensation intact, coordination intact, no acute visual deficits noted, mild increase in processing time when conversing and answering questions, and mildly impaired ROM/tone with dorsiflexion > plantarflexion bilaterally (L mildly worse than R). 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 Pt performed all aspects of mobility with close CGA and RW, with initial slight LOB posteriorly on initial sit to stand attempt, improving with instruction in base of support and hand placement. Pt/spouse instructed in  falls prevention strategies, DME for optimizing safety with showers and tub transfers, and balance exercise to improve pt's awareness of neutral stance and wide BOS to improve balance with mobility. Pt will benefit from skilled OT services to address noted impairments and functional deficits in order to maximize return to PLOF. Recommend HHOT services upon discharge to focus on home/safety, functional mobility training, falls prevention strategies, energy conservation and compensatory strategies for BLE fatigue, and balance exercises to maximize safety and independence with mobility, self care tasks, and community mobility and engagement.     Follow Up Recommendations  Home health OT    Equipment Recommendations  Other (comment)(TBD)    Recommendations for Other Services       Precautions / Restrictions Precautions Precautions: Fall Restrictions Weight Bearing Restrictions: No      Mobility Bed Mobility Overal bed mobility: Modified Independent             General bed mobility comments: additional time/effort  Transfers Overall transfer level: Needs assistance Equipment used: Rolling walker (2 wheeled) Transfers: Sit to/from Stand Sit to Stand: Min guard;From elevated surface         General transfer comment: min guard with bed elevated (to mimic home environment), initial attempt with slight LOB posteriorly, improving with subsequent efforts but will has posterior tendency    Balance Overall balance assessment: Needs assistance Sitting-balance support: Feet supported Sitting balance-Leahy Scale: Good   Postural control: Posterior lean Standing balance support: Bilateral upper extremity supported;During functional activity Standing balance-Leahy Scale: Poor  ADL either performed or assessed with clinical judgement   ADL Overall ADL's : Needs assistance/impaired     Grooming: Standing;Min guard;Wash/dry hands;Oral  care;Wash/dry face Grooming Details (indicate cue type and reason): min guard for balance while standing at sink to perform grooming tasks Upper Body Bathing: Sitting;Supervision/ safety   Lower Body Bathing: Sit to/from stand;Min guard Lower Body Bathing Details (indicate cue type and reason): min guard in standing Upper Body Dressing : Sitting;Supervision/safety   Lower Body Dressing: Sit to/from stand;Min guard Lower Body Dressing Details (indicate cue type and reason): min guard in standing Toilet Transfer: RW;Comfort height toilet;Min guard;Ambulation           Functional mobility during ADLs: Min guard;Rolling walker(unsteady, posterior lean)       Vision Baseline Vision/History: Wears glasses(recent cataract surgery) Wears Glasses: Reading only Patient Visual Report: No change from baseline Vision Assessment?: No apparent visual deficits     Perception     Praxis      Pertinent Vitals/Pain Pain Assessment: 0-10 Pain Score: 3  Pain Location: HA Pain Descriptors / Indicators: Headache Pain Intervention(s): Limited activity within patient's tolerance;Monitored during session;Repositioned     Hand Dominance Right   Extremity/Trunk Assessment Upper Extremity Assessment Upper Extremity Assessment: Overall WFL for tasks assessed(sensation, coordination, strength intact bilaterally)   Lower Extremity Assessment Lower Extremity Assessment: Generalized weakness;Defer to PT evaluation;RLE deficits/detail;LLE deficits/detail RLE Deficits / Details: sensation intact, mild increase flexor tone and decreased ROM noted with dorsiflexion  RLE Coordination: decreased gross motor LLE Deficits / Details: sensation intact, mild increase flexor tone and decreased ROM noted with dorsiflexion  LLE Coordination: decreased gross motor   Cervical / Trunk Assessment Cervical / Trunk Assessment: Normal   Communication Communication Communication: No difficulties   Cognition  Arousal/Alertness: Awake/alert Behavior During Therapy: WFL for tasks assessed/performed Overall Cognitive Status: Within Functional Limits for tasks assessed                                 General Comments: very mild increase in processing time but able to follow all commands, oriented, alert   General Comments       Exercises Other Exercises Other Exercises: Pt instructed in standing balance exercises to help him find a more neutral stance and broader base of support to help balance and minimize posterior LOB Other Exercises: Pt/spouse instructed in DME to help improve safety in the bathroom/bathtub - would benefit from trialing shower chair vs TTB  Other Exercises: Pt/spouse instructed in falls prevention strategies to minimize falls risk   Shoulder Instructions      Home Living Family/patient expects to be discharged to:: Private residence Living Arrangements: Spouse/significant other Available Help at Discharge: Family;Available PRN/intermittently Type of Home: House Home Access: Stairs to enter [employer] of Steps: 6 steps + bilat rails from side entrance (primary); 5+1 steps for front entrance   Home Layout: One level     Bathroom Shower/Tub: Tub/shower unit;Curtain   Biochemist, clinical: Standard     Home Equipment: Environmental consultant - 2 wheels;Cane - single point          Prior Functioning/Environment Level of Independence: Independent        Comments: working part time but is retired. 6 falls in past 12 months (due to LOB, typically posteriorly). Indep with ADL. Pt self limits driving when not feeling well. Pt notes increasing fatigue with longer walking distances since fall in 2016 ("my legs get tired  more quickly; I can't even go from the car into the store without feeling like I have to sit down because my legs are so tired.")        OT Problem List: Decreased knowledge of use of DME or AE;Impaired tone;Decreased range of motion;Decreased  activity tolerance;Pain;Impaired balance (sitting and/or standing);Decreased safety awareness      OT Treatment/Interventions: Self-care/ADL training;Balance training;Therapeutic exercise;Therapeutic activities;Energy conservation;DME and/or AE instruction;Patient/family education    OT Goals(Current goals can be found in the care plan section) Acute Rehab OT Goals Patient Stated Goal: get rid of headache and improve balance  OT Goal Formulation: With patient/family Time For Goal Achievement: 08/15/17 Potential to Achieve Goals: Good ADL Goals Pt Will Transfer to Toilet: with supervision;ambulating(comfort height toilet, RW for ambulation, no LOB) Additional ADL Goal #1: Pt will implement learned compensatory strategies to improve static and dynamic standing balance during self care tasks with LRAD and supervision only.  OT Frequency: Min 2X/week   Barriers to D/C:            Co-evaluation              AM-PAC PT "6 Clicks" Daily Activity     Outcome Measure Help from another person eating meals?: None Help from another person taking care of personal grooming?: A Little Help from another person toileting, which includes using toliet, bedpan, or urinal?: A Little Help from another person bathing (including washing, rinsing, drying)?: A Little Help from another person to put on and taking off regular upper body clothing?: A Little Help from another person to put on and taking off regular lower body clothing?: A Little 6 Click Score: 19   End of Session Equipment Utilized During Treatment: Gait belt;Rolling walker  Activity Tolerance: Patient tolerated treatment well Patient left: in bed;with call bell/phone within reach;with bed alarm set;with family/visitor present;with nursing/sitter in room  OT Visit Diagnosis: Other abnormalities of gait and mobility (R26.89);Unsteadiness on feet (R26.81);Repeated falls (R29.6);Pain Pain - Right/Left: (headache)                Time:  9379-0240 OT Time Calculation (min): 55 min Charges:  OT General Charges $OT Visit: 1 Visit OT Evaluation $OT Eval Low Complexity: 1 Low OT Treatments $Self Care/Home Management : 23-37 mins  Jeni Salles, MPH, MS, OTR/L ascom [phone] 08/01/17, 12:04 PM

## 2017-08-01 NOTE — Care Management (Signed)
Physical therapy and occupational therapy are recommending home with Home Health and their services. Discussed agencies. Chose [facility]. Floydene Flock, [facility] representative updated. Discharge to home today per Dr. Oleh Genin RN MSN CCM Care Management  [phone]

## 2017-08-01 NOTE — Progress Notes (Signed)
Received MD order to discharge patient to home, reviewed homes meds, discharge instructions and follow up appointments with patient and pateint verbalized understanding

## 2017-08-01 NOTE — Progress Notes (Signed)
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  PMHx:  pituitary tumor, HTN, HLD, DM, pituitary tumor, chronic headaches, and notable fall in 04/2015 where pt hit his head and was taken to ER (per pt report).    PT Comments    Pt agreeable to PT; reports HA 8/10 currently. Pt noted to have difficulty with balance/righting reactions during STS transfers and with stand/ambulation activities. 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 Pt noted to have mild weakness in LLE as compared to right with WNL range of BLE ankle, knee and hip joints grossly. Pt tends to leans posteriorly primarily with stand/ambulation activities, but also noted to have L lateral lean at times and difficulty maintaining static stand with perturbations L and righting to midline once wt shifted lateral L or posteriorly. Ambulation demonstrated with shuffling steps with intermittent Min A needed for balance correction; upon cues to increase step height and heel strike, balance declined further. Pt participated in LE and stand balance exercises before sitting in chair. Pt strongly advised to use rolling walker for stand/ambulation activities with assist. Agreeable. Continue PT to progress strength, endurance and balance to improve safety and functional mobility to decrease fall risk.    Follow Up Recommendations  Home health PT     Equipment Recommendations       Recommendations for Other Services       Precautions / Restrictions Precautions Precautions: Fall Precaution Comments: Approximately 6 falls in last 12 months Restrictions Weight Bearing Restrictions: No     Mobility  Bed Mobility Overal bed mobility: Modified Independent             General bed mobility comments: additional time/effort  Transfers Overall transfer level: Needs assistance Equipment used: Rolling walker (2 wheeled);None Transfers: Sit to/from Stand Sit to Stand: Min guard;Min assist         General transfer comment: Uses posterior legs to aid in elevating with posterior lean; without rw pt has LOB requiring Min A due right, with rw able to steady with Min guard  Ambulation/Gait Ambulation/Gait assistance: Min assist Ambulation Distance (Feet): 80 Feet(20) Assistive device: Rolling walker (2 wheeled)(Attempted without AD; unable ) Gait Pattern/deviations: Shuffle;Leaning posteriorly Gait velocity: decreased Gait velocity interpretation: <1.8 ft/sec, indicative of risk for recurrent falls General Gait Details: posterior lean with shuffling steps. When cued for improved step height and heel strike, greater difficulty balancing and pt notes "it feels wrong"   Stairs            Wheelchair Mobility    Modified Rankin (Stroke Patients Only)       Balance Overall balance assessment: Needs assistance Sitting-balance support: Feet supported Sitting balance-Leahy Scale: Good   Postural control: Posterior lean Standing balance support: Bilateral upper extremity supported;No upper extremity supported Standing balance-Leahy Scale: Poor                              Cognition Arousal/Alertness: Awake/alert Behavior During Therapy: WFL for tasks assessed/performed Overall Cognitive Status: Within Functional Limits for tasks assessed  General Comments: very mild increase in processing time but able to follow all commands, oriented, alert      Exercises General Exercises - Lower Extremity Quad Sets: Strengthening;Both;10 reps Long Arc Quad: AROM;Strengthening;Both;15 reps;Seated Toe Raises:  AROM;Strengthening;15 reps;Seated Other Exercises Other Exercises: Balance exercises to include stand march with/without UE support. Stand perturbations (Mod)  Other Exercises: Pt/spouse instructed in DME to help improve safety in the bathroom/bathtub - would benefit from trialing shower chair vs TTB  Other Exercises: Pt/spouse instructed in falls prevention strategies to minimize falls risk    General Comments        Pertinent Vitals/Pain Pain Assessment: 0-10 Pain Score: 8  Pain Location: HA Pain Descriptors / Indicators: Headache Pain Intervention(s): Limited activity within patient's tolerance;Monitored during session;Repositioned    Home Living Family/patient expects to be discharged to:: Private residence Living Arrangements: Spouse/significant other Available Help at Discharge: Family;Available PRN/intermittently Type of Home: House Home Access: Stairs to enter   Home Layout: One level Home Equipment: Environmental consultant - 2 wheels;Cane - single point      Prior Function Level of Independence: Independent      Comments: working part time but is retired. 6 falls in past 12 months (due to LOB, typically posteriorly). Indep with ADL. Pt self limits driving when not feeling well. Pt notes increasing fatigue with longer walking distances since fall in 2016 ("my legs get tired more quickly; I can't even go from the car into the store without feeling like I have to sit down because my legs are so tired.")   PT Goals (current goals can now be found in the care plan section) Acute Rehab PT Goals Patient Stated Goal: get rid of headache and improve balance  Progress towards PT goals: Progressing toward goals    Frequency    Min 2X/week      PT Plan Discharge plan needs to be updated    Co-evaluation              AM-PAC PT "6 Clicks" Daily Activity  Outcome Measure  Difficulty turning over in bed (including adjusting bedclothes, sheets and blankets)?: None Difficulty moving  from lying on back to sitting on the side of the bed? : None Difficulty sitting down on and standing up from a chair with arms (e.g., wheelchair, bedside commode, etc,.)?: Unable Help needed moving to and from a bed to chair (including a wheelchair)?: A Little Help needed walking in hospital room?: A Little Help needed climbing 3-5 steps with a railing? : A Lot 6 Click Score: 17    End of Session Equipment Utilized During Treatment: Gait belt Activity Tolerance: Other (comment);Patient limited by pain(HA, balance ) Patient left: in chair;with call bell/phone within reach;with family/visitor present;Other (comment)(declined chair alarm; will call for assist)   PT Visit Diagnosis: Unsteadiness on feet (R26.81);Muscle weakness (generalized) (M62.81);Ataxic gait (R26.0);Other symptoms and signs involving the nervous system (R29.898);Pain Pain - part of body: (Head)     Time: 8144-8185 PT Time Calculation (min) (ACUTE ONLY): 34 min  Charges:  $Gait Training: 8-22 mins $Therapeutic Exercise: 8-22 mins                    G Codes:        Larae Grooms, PTA 08/01/2017, 2:20 PM

## 2017-08-01 NOTE — Care Management Note (Signed)
Case Management Note  Patient Details  Name: DEMBA NIGH MRN: [id number] Date of Birth: 1951/03/03  Subjective/Objective:   Admitted to [facility] under observation status with the diagnosis of headache. Lives with wife, [name] [phone]). Last seen Dr. Penne Lash at [facility] a couple of weeks ago. Prescriptions are filled at [facility] on [address]. No home Health. No skilled facility. No home oxygen. CPAP in the home x 6 months. Takes care of all basic activities of daily living himself, drives. Last fall was a couple of weeks ago. Appetite is up and down. Family will transport.                 Action/Plan: Physical therapy evaluation completed. Recommended Inpatient Acute Rehabilitation yesterday. Re-evaluation is planned for today. Occupational therapy will evaluate today   Expected Discharge Date:                  Expected Discharge Plan:     In-House Referral:     Discharge planning Services     Post Acute Care Choice:    Choice offered to:     DME Arranged:    DME Agency:     HH Arranged:    HH Agency:     Status of Service:     If discussed at [employer] of Stay Meetings, dates discussed:    Additional Comments:  Shelbie Ammons, RN MSN CCM Care Management [phone] 08/01/2017, 9:57 AM

## 2017-08-01 NOTE — Progress Notes (Signed)
Subjective: Patient reports that headache at a 3/10 today.  Mobility improved.    Objective: Current vital signs: BP 118/73 (BP Location: Left Arm)   Pulse 73   Temp 98.2 F (36.8 C) (Oral)   Resp 18   Ht 5\' 6"  (1.676 m)   Wt 81.2 kg (179 lb)   SpO2 96%   BMI 28.89 kg/m  Vital signs in last 24 hours: Temp:  [97.7 F (36.5 C)-98.2 F (36.8 C)] 98.2 F (36.8 C) (03/25 0928) Pulse Rate:  [68-75] 73 (03/25 1117) Resp:  [18] 18 (03/25 0928) BP: (95-119)/(59-73) 118/73 (03/25 0928) SpO2:  [94 %-96 %] 96 % (03/25 1117)  Intake/Output from previous day: 03/24 0701 - 03/25 0700 In: 290 [P.O.:240; IV Piggyback:50] Out: -  Intake/Output this shift: Total I/O In: 240 [P.O.:240] Out: -  Nutritional status: Fall precautions Diet heart healthy/carb modified Room service appropriate? Yes; Fluid consistency: Thin  Neurologic Exam: Mental Status: Alert, oriented, thought content appropriate.  Speech fluent without evidence of aphasia but at times responses delayed.  Able to follow 3 step commands without difficulty. Cranial Nerves: II: Discs flat bilaterally; Visual fields grossly normal, pupils equal, round, reactive to light and accommodation III,IV, VI: ptosis not present, extra-ocular motions intact bilaterally V,VII: smile symmetric, facial light touch sensation normal bilaterally VIII: hearing normal bilaterally IX,X: gag reflex present XI: bilateral shoulder shrug XII: midline tongue extension Motor: Right : Upper extremity   5/5    Left:     Upper extremity   5/5  Lower extremity   5/5     Lower extremity   5/5 Tone and bulk:normal tone throughout; no atrophy noted Cerebellar: normal finger-to-nose testing bilaterally  Lab Results: Basic Metabolic Panel: Recent Labs  Lab 07/30/17 1326  NA 136  K 4.0  CL 103  CO2 23  GLUCOSE 148*  BUN 13  CREATININE 0.99  CALCIUM 9.7    Liver Function Tests: Recent Labs  Lab 07/30/17 1326  AST 29  ALT 27  ALKPHOS 72   BILITOT 1.0  PROT 7.9  ALBUMIN 4.5   No results for input(s): LIPASE, AMYLASE in the last 168 hours. No results for input(s): AMMONIA in the last 168 hours.  CBC: Recent Labs  Lab 07/30/17 1326  WBC 7.6  NEUTROABS 5.2  HGB 15.9  HCT 47.1  MCV 91.4  PLT 235    Cardiac Enzymes: Recent Labs  Lab 07/30/17 1326  TROPONINI <0.03    Lipid Panel: Recent Labs  Lab 07/31/17 0402  CHOL 149  TRIG 300*  HDL 27*  CHOLHDL 5.5  VLDL 60*  LDLCALC 62    CBG: Recent Labs  Lab 07/31/17 1207 07/31/17 1650 07/31/17 2136 08/01/17 0829 08/01/17 1156  GLUCAP 191* 215* 273* 174* 189*    Microbiology: No results found for this or any previous visit.  Coagulation Studies: Recent Labs    07/30/17 1326  LABPROT 12.1  INR 0.90    Imaging: Ct Angio Head W Or Wo Contrast  Result Date: 07/30/2017 CLINICAL DATA:  67 y/o M; severe headache and speech difficulty. History of pseudoaneurysm. EXAM: CT ANGIOGRAPHY HEAD AND NECK TECHNIQUE: Multidetector CT imaging of the head and neck was performed using the standard protocol during bolus administration of intravenous contrast. Multiplanar CT image reconstructions and MIPs were obtained to evaluate the vascular anatomy. Carotid stenosis measurements (when applicable) are obtained utilizing NASCET criteria, using the distal internal carotid diameter as the denominator. CONTRAST:  58mL ISOVUE-370 IOPAMIDOL (ISOVUE-370) INJECTION 76% COMPARISON:  07/30/2017 CT head.  06/15/2006 MRI/MRA head. FINDINGS: CTA NECK FINDINGS Aortic arch: Standard branching. Imaged portion shows no evidence of aneurysm or dissection. 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 Right carotid system: No evidence of dissection, stenosis (50% or greater) or occlusion. Left carotid system: No evidence of dissection, stenosis (50% or greater) or  occlusion. Vertebral arteries: Outpouching of the vessel wall with flap involving left subclavian artery extends to the left vertebral artery origin there is calcified plaque resulting in moderate left vertebral artery origin stenosis. Right dominant vertebral artery. The arteries in the neck or otherwise patent without dissection or stenosis. Skeleton: Moderate cervical spondylosis greatest at the C5-C7 levels or uncovertebral and facet hypertrophy result in left-greater-than-right foraminal encroachment. No high-grade canal stenosis. Other neck: Negative. Upper chest: Negative. Review of the MIP images confirms the above findings CTA HEAD FINDINGS Anterior circulation: No significant stenosis, proximal occlusion, or aneurysm. Mild non stenotic calcific atherosclerosis of carotid siphons. Blush of enhancement measuring 14 x 18 x 15 mm (AP x ML x CC series 6, image 266 and series 7, image 130) with prominent draining veins extending into an enlarged left cortical vein which extends over the left frontal lobe to the superior sagittal sinus anteriorly. Posterior circulation: No significant stenosis, proximal occlusion, aneurysm, or vascular malformation. Venous sinuses: As permitted by contrast timing, patent. Anatomic variants: Diminutive anterior communicating artery. Posterior communicating artery not identified, likely hypoplastic or absent. Delayed phase: Blush of enhancement in the left superolateral frontal lobe measuring 14 x 18 x 15 mm (AP x ML x CC series 6, image 266 and series 7, image 130) with prominent draining veins extending into an enlarged left cortical vein which extends over the left frontal lobe to the superior sagittal sinus anteriorly. Review of the MIP images confirms the above findings IMPRESSION: CTA neck: 1. Left subclavian artery vessel wall outpouching with flap possibly representing pseudoaneurysm or focal dissection measuring up to 19 mm extending to left vertebral artery origin. 2.  Moderate left vertebral artery origin stenosis. 3. No significant stenosis, occlusion, or dissection of bilateral carotid systems and right vertebral artery. CTA head: 1. No large vessel occlusion, aneurysm, or significant stenosis of the anterior or posterior intracranial circulation. 2. 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 These results were called by telephone at the time of interpretation on 07/30/2017 at 3:15 pm to Dr. Conni Slipper , who verbally acknowledged these results. Electronically Signed   By: Kristine Garbe M.D.   On: 07/30/2017 15:18   Ct Angio Neck W And/or Wo Contrast  Result Date: 07/30/2017 CLINICAL DATA:  67 y/o M; severe headache and speech difficulty. History of pseudoaneurysm. EXAM: CT ANGIOGRAPHY HEAD AND NECK TECHNIQUE: Multidetector CT imaging of the head and neck was performed using the standard protocol during bolus administration of intravenous contrast. Multiplanar CT image reconstructions and MIPs were obtained to evaluate the vascular anatomy. Carotid stenosis measurements (when applicable) are obtained utilizing NASCET criteria, using the distal internal carotid diameter as the denominator. CONTRAST:  67mL ISOVUE-370 IOPAMIDOL (ISOVUE-370) INJECTION 76% COMPARISON:  07/30/2017 CT head.  06/15/2006 MRI/MRA head. FINDINGS: CTA NECK FINDINGS Aortic arch: Standard branching. Imaged portion shows no evidence of aneurysm  or dissection. 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 Right carotid system: No evidence of dissection, stenosis (50% or greater) or occlusion. Left carotid system: No evidence of dissection, stenosis (50% or greater) or occlusion. Vertebral  arteries: Outpouching of the vessel wall with flap involving left subclavian artery extends to the left vertebral artery origin there is calcified plaque resulting in moderate left vertebral artery origin stenosis. Right dominant vertebral artery. The arteries in the neck or otherwise patent without dissection or stenosis. Skeleton: Moderate cervical spondylosis greatest at the C5-C7 levels or uncovertebral and facet hypertrophy result in left-greater-than-right foraminal encroachment. No high-grade canal stenosis. Other neck: Negative. Upper chest: Negative. Review of the MIP images confirms the above findings CTA HEAD FINDINGS Anterior circulation: No significant stenosis, proximal occlusion, or aneurysm. Mild non stenotic calcific atherosclerosis of carotid siphons. Blush of enhancement measuring 14 x 18 x 15 mm (AP x ML x CC series 6, image 266 and series 7, image 130) with prominent draining veins extending into an enlarged left cortical vein which extends over the left frontal lobe to the superior sagittal sinus anteriorly. Posterior circulation: No significant stenosis, proximal occlusion, aneurysm, or vascular malformation. Venous sinuses: As permitted by contrast timing, patent. Anatomic variants: Diminutive anterior communicating artery. Posterior communicating artery not identified, likely hypoplastic or absent. Delayed phase: Blush of enhancement in the left superolateral frontal lobe measuring 14 x 18 x 15 mm (AP x ML x CC series 6, image 266 and series 7, image 130) with prominent draining veins extending into an enlarged left cortical vein which extends over the left frontal lobe to the superior sagittal sinus anteriorly. Review of the MIP images confirms the above findings IMPRESSION: CTA neck: 1. Left subclavian artery vessel wall outpouching with flap possibly representing pseudoaneurysm or focal dissection measuring up to 19 mm extending to left vertebral artery origin. 2. Moderate left vertebral  artery origin stenosis. 3. No significant stenosis, occlusion, or dissection of bilateral carotid systems and right vertebral artery. CTA head: 1. No large vessel occlusion, aneurysm, or significant stenosis of the anterior or posterior intracranial circulation. 2. 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 These results were called by telephone at the time of interpretation on 07/30/2017 at 3:15 pm to Dr. Conni Slipper , who verbally acknowledged these results. Electronically Signed   By: Kristine Garbe M.D.   On: 07/30/2017 15:18   Mr Brain Wo Contrast  Result Date: 07/31/2017 CLINICAL DATA:  TIA, initial exam. Headache. 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 Episode of slurred speech and word-finding difficulties. EXAM: MRI HEAD WITHOUT CONTRAST TECHNIQUE: Multiplanar, multiecho pulse sequences of the brain and surrounding structures were obtained without intravenous contrast. COMPARISON:  CT head and CTA head and neck 07/30/2017. MRI brain 06/15/2006 FINDINGS: Brain: Maximal dimension of the pituitary is 13 mm. This is increased from 10.5 mm in 2008. Moderate is periventricular and subcortical white matter changes are present bilaterally. There is no significant encephalomalacia associated with the area of suspected AVM in the left temporal lobe prominent flow voids are evident. No acute infarct or hemorrhage is present. Susceptibility in the anterior left frontal lobe likely reflects remote hemorrhagic ischemia. The  ventricles are of normal size. No significant extra-axial fluid collection is present. 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 Brainstem and cerebellum are otherwise normal. The internal auditory canals are normal bilaterally. Vascular: Flow is  present in the major intracranial arteries. Prominent flow voids are present in the left temporal region. Skull and upper cervical spine: The skull base is within normal limits. The craniocervical junction is normal. The upper cervical spine is unremarkable. Sinuses/Orbits: The paranasal sinuses and mastoid air cells are clear. Globes and orbits are within normal limits. IMPRESSION: 1. No acute or subacute infarct to explain the patient's stroke like symptoms. 2. Atrophy and white matter disease is moderately advanced for age. This likely reflects the sequela of chronic microvascular ischemia. 3. 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 4. Focal susceptibility in the right frontal lobe measuring 8 mm is likely related to remote hemorrhagic ischemia. Electronically Signed   By: San Morelle M.D.   On: 07/31/2017 09:06    Medications:  I have reviewed the patient's current medications. Scheduled: . amitriptyline  10 mg Oral QHS  . aspirin EC  81 mg Oral Daily  . atorvastatin  80 mg Oral Daily  . cholecalciferol  1,000 Units Oral Daily  . enoxaparin (LOVENOX) injection  40 mg Subcutaneous Q24H  . gabapentin  200 mg Oral TID  . glimepiride  1 mg Oral Q breakfast  . hydrOXYzine  10 mg Oral Daily  . insulin aspart  0-9 Units Subcutaneous TID WC  . losartan  100 mg Oral Daily  . Melatonin  5 mg Oral QHS  . metoprolol succinate  75 mg Oral Daily  . pantoprazole  40 mg Oral Q breakfast  . testosterone cypionate  200 mg Intramuscular Q14 Days  . traZODone  25 mg Oral QHS  . venlafaxine XR  75 mg Oral Daily  . vitamin B-12  1,000 mcg Oral Daily    Assessment/Plan: Patient improved.  Long history of migraines.  Wishes to change neurologist to Dr. Manuella Ghazi at [facility]    Would continue with recommendations as detailed by Dr. Irish Elders.     LOS: 0 days   Alexis Goodell, MD Neurology [phone] 08/01/2017  2:04 PM

## 2017-08-01 NOTE — Progress Notes (Signed)
SLP Cancellation Note  Patient Details Name: Jon [name] MRN: [id number] DOB: 10/03/1950   Cancelled treatment:       Reason Eval/Treat Not Completed: (pt has discharged from facility). Chart had been reviewed; MRI indicated No acute or subacute infarct to explain the patient's stroke like Symptoms; Atrophy and white matter disease is moderately advanced for age. Would recommend pt f/u w/ a full Neurological assessment to address his c/o his Inconsistent slower rate of speech and word-finding issues he reports.    Orinda Kenner, MS, CCC-SLP Tida Saner 08/01/2017, 3:56 PM

## 2017-08-01 NOTE — Progress Notes (Signed)
SLP Cancellation Note  Patient Details Name: Jon [name] MRN: [id number] DOB: 1950/10/30   Cancelled treatment:       Reason Eval/Treat Not Completed: Patient at procedure or test/unavailable(chart reviewed; pt eating breakfast meal. NSG present. ) ST services will return this pm. Pt agreed.    Orinda Kenner, MS, CCC-SLP Watson,Katherine 08/01/2017, 10:01 AM

## 2017-08-01 NOTE — Plan of Care (Signed)
Patient resting well, not in any form of distress. Able to make needs known. Here for stroke work up, CT and MRI is negative for stroke.   Rest and comforts maintained. Needs attended. Will continue to monitor.

## 2017-08-02 ENCOUNTER — Emergency Department: Payer: BLUE CROSS/BLUE SHIELD

## 2017-08-02 ENCOUNTER — Emergency Department
Admission: EM | Admit: 2017-08-02 | Discharge: 2017-08-02 | Disposition: A | Discharge status: Home / Self Care | Payer: BLUE CROSS/BLUE SHIELD | Attending: Emergency Medicine | Admitting: Emergency Medicine

## 2017-08-02 DIAGNOSIS — Z79899 Other long term (current) drug therapy: Secondary | ICD-10-CM | POA: Insufficient documentation

## 2017-08-02 DIAGNOSIS — F410 Panic disorder [episodic paroxysmal anxiety] without agoraphobia: Secondary | ICD-10-CM | POA: Diagnosis not present

## 2017-08-02 DIAGNOSIS — E119 Type 2 diabetes mellitus without complications: Secondary | ICD-10-CM | POA: Insufficient documentation

## 2017-08-02 DIAGNOSIS — Z7984 Long term (current) use of oral hypoglycemic drugs: Secondary | ICD-10-CM | POA: Insufficient documentation

## 2017-08-02 DIAGNOSIS — IMO0001 Reserved for inherently not codable concepts without codable children: Secondary | ICD-10-CM

## 2017-08-02 DIAGNOSIS — I10 Essential (primary) hypertension: Secondary | ICD-10-CM | POA: Diagnosis not present

## 2017-08-02 DIAGNOSIS — T6291XA Toxic effect of unspecified noxious substance eaten as food, accidental (unintentional), initial encounter: Secondary | ICD-10-CM | POA: Insufficient documentation

## 2017-08-02 DIAGNOSIS — R51 Headache: Secondary | ICD-10-CM | POA: Insufficient documentation

## 2017-08-02 DIAGNOSIS — R0602 Shortness of breath: Secondary | ICD-10-CM | POA: Diagnosis present

## 2017-08-02 DIAGNOSIS — R519 Headache, unspecified: Secondary | ICD-10-CM

## 2017-08-02 LAB — COMPREHENSIVE METABOLIC PANEL
ALT: 29 U/L (ref 17–63)
AST: 35 U/L (ref 15–41)
Albumin: 4.3 g/dL (ref 3.5–5.0)
Alkaline Phosphatase: 66 U/L (ref 38–126)
Anion gap: 10 (ref 5–15)
BUN: 27 mg/dL — ABNORMAL HIGH (ref 6–20)
CO2: 21 mmol/L — ABNORMAL LOW (ref 22–32)
Calcium: 8.7 mg/dL — ABNORMAL LOW (ref 8.9–10.3)
Chloride: 104 mmol/L (ref 101–111)
Creatinine, Ser: 0.99 mg/dL (ref 0.61–1.24)
GFR calc Af Amer: >60 mL/min (ref >60)
GFR calc non Af Amer: >60 mL/min (ref >60)
Glucose, Bld: 175 mg/dL — ABNORMAL HIGH (ref 65–99)
Potassium: 4.1 mmol/L (ref 3.5–5.1)
Sodium: 135 mmol/L (ref 135–145)
Total Bilirubin: 1.1 mg/dL (ref 0.3–1.2)
Total Protein: 7 g/dL (ref 6.5–8.1)

## 2017-08-02 LAB — CBC
HCT: 45 % (ref 40.0–52.0)
Hemoglobin: 15.6 g/dL (ref 13.0–18.0)
MCH: 31.6 pg (ref 26.0–34.0)
MCHC: 34.7 g/dL (ref 32.0–36.0)
MCV: 91.1 fL (ref 80.0–100.0)
Platelets: 179 10*3/uL (ref 150–440)
RBC: 4.94 MIL/uL (ref 4.40–5.90)
RDW: 14.3 % (ref 11.5–14.5)
WBC: 10.2 10*3/uL (ref 3.8–10.6)

## 2017-08-02 LAB — TROPONIN I: Troponin I: <0.03 ng/mL (ref <0.03)

## 2017-08-02 MED ORDER — ONDANSETRON 4 MG PO TBDP: 4.0000 mg | ORAL_TABLET | Freq: Three times a day (TID) | ORAL | 0 refills | Status: DC | PRN

## 2017-08-02 MED ORDER — SODIUM CHLORIDE 0.9 % IV BOLUS
1000.0000 mL | Freq: Once | INTRAVENOUS | Status: AC
  Administered 2017-08-02: 1000 mL via INTRAVENOUS

## 2017-08-02 MED ORDER — BUTALBITAL-APAP-CAFFEINE 50-325-40 MG PO TABS
1.0000 | ORAL_TABLET | Freq: Once | ORAL | Status: AC
  Administered 2017-08-02: 1 via ORAL
  Filled 2017-08-02: qty 1

## 2017-08-02 MED ORDER — METOCLOPRAMIDE HCL 5 MG/ML IJ SOLN
10.0000 mg | Freq: Once | INTRAMUSCULAR | Status: AC
  Administered 2017-08-02: 10 mg via INTRAVENOUS
  Filled 2017-08-02: qty 2

## 2017-08-02 MED ORDER — BUTALBITAL-APAP-CAFFEINE 50-325-40 MG PO TABS: 1.0000 | ORAL_TABLET | Freq: Four times a day (QID) | ORAL | 0 refills | Status: AC | PRN

## 2017-08-02 MED ORDER — DIPHENHYDRAMINE HCL 50 MG/ML IJ SOLN
12.5000 mg | Freq: Once | INTRAMUSCULAR | Status: AC
  Administered 2017-08-02: 12.5 mg via INTRAVENOUS
  Filled 2017-08-02: qty 1

## 2017-08-02 NOTE — ED Triage Notes (Signed)
Pt to Ed reporting sudden onset of SOB at home 1 hour ago. Pt reports he felt unable to take a deep breath. Pt was seen in ED on Saturday for stroke like symptoms that were ruled  TIA and pt was discharged yesterday. Pt reports he had chest pain before the SOB started but pts wife also reports pt has a hx of severe panic attacks as well. Pt denies feeling stressed before this episode began.   Pts wife reports while she was talking to 911 pt started having full body jerking. No trauma noted to pts tongue and no loss of bowels. Pt is tired upon arrival to ED but no neuro deficits noted and pt is alert and oriented 4.   Pt also reports feeling as though his heart is racing with NVD that started today. Pt also reports a headache that has not changed since he was first seen on Saturday.

## 2017-08-02 NOTE — ED Provider Notes (Signed)
[facility] Emergency Department Provider Note  ____________________________________________  Time seen: Approximately 8:13 PM  I have reviewed the triage vital signs and the nursing notes.   HISTORY  Chief Complaint Seizures and Shortness of Breath   HPI Jon [name] is a 67 y.o. male with a history of migraine headaches, CVA, hypertension, AVM of the brain, diabetes who presents for evaluation of nausea, vomiting, diarrhea, and headache.  Patient was discharged from the hospital yesterday after being admitted for a severe headache and TIA.  Had an extensive workup including CT and MRI which showed a stable AVM and no other acute findings.  According to patient he has been having similar headaches for several years.  He has been on Fioricet in the past and that has helped.  Yesterday after he went home he had spoiled peanut butter and since then has been having severe vomiting and diarrhea.  He reports several episodes of nonbloody nonbilious emesis and watery diarrhea.  He was feeling very weak and dizzy.  He laid in bed and started having a panic attack.  He started hyperventilating and then started shaking all over.  The wife thought he was having a seizure.  Patient was aware and speaking during this episode.  No urinary or bowel loss, no LOC, no tongue trauma.  Patient reports that he was feeling short of breath during that episode.  The shortness of breath has resolved.  Patient has a history of severe panic attacks.  No prior history of seizure.  No fever but has had chills.  Is also complaining of a headache that is 5 out of 10, diffuse, sharp which is identical to his migraine headaches.  No abdominal pain, no chest pain, no shortness of breath, no fever.   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 No worrisome arrhythmias. "dizziness, passed out, headache, feeling faint, fatigue" correlated with sinus rhythm, 94-97 BPM and sinus tachycardia, 112-118 BPM.  04/2015 echo: EF greater than 55%. Mild LVH. Small left ventricle.   Severe episode of recurrent major depressive disorder, without psychotic features 09/25/2014  MDD (major depressive disorder), recurrent, with melancholic features 96/75/9163  Rhytides 01/04/2013  Nephrolithiasis 04/11/2012  Gross hematuria 04/11/2012  Diabetes mellitus, type II 12/31/2011  Gastritis 06/18/2011  Heartburn 11/04/2010  Headache(784.0)      Prior to Admission medications   Medication Sig Start Date End Date Taking? Authorizing Provider  amitriptyline (ELAVIL) 10 MG tablet Take 1 tablet (10 mg total) by mouth at bedtime. 08/01/17   Hillary Bow, MD  aspirin EC 81 MG tablet Take 81 mg by mouth daily.    [provider]  atorvastatin (LIPITOR) 80 MG tablet Take 80 mg by mouth daily. 07/01/17   [provider]  butalbital-acetaminophen-caffeine (FIORICET, ESGIC) 50-325-40 MG tablet Take 1-2 tablets by mouth every 6 (six) hours as needed for headache. 08/02/17 08/02/18  Alfred Levins, [place], MD  Cholecalciferol (D3-1000) 1000 units tablet Take 1,000 Units by mouth daily.    [provider]  gabapentin (NEURONTIN) 100 MG capsule Take 200 mg by mouth 3 (three) times daily.    [provider]  glimepiride (AMARYL) 1 MG tablet Take 1  mg by mouth daily. 06/20/17   [provider]  hydrOXYzine (ATARAX/VISTARIL) 10 MG tablet Take 1 tablet by mouth daily. 07/01/17   [provider]  losartan (COZAAR) 100 MG tablet Take 100 mg by mouth daily. 06/14/17   [provider]  Melatonin 3 MG TABS Take 6 mg by mouth at bedtime.    [provider]  metoprolol succinate (TOPROL-XL) 50 MG 24 hr tablet Take 75 mg by mouth daily.  07/28/17   [provider]  ondansetron (ZOFRAN ODT) 4 MG disintegrating tablet Take 1 tablet (4 mg total) by mouth every 8 (eight) hours as needed for nausea or vomiting. 08/02/17   Alfred Levins, [place], MD  pantoprazole (PROTONIX) 40 MG tablet Take 40 mg by mouth daily. 06/27/17   [provider]  testosterone cypionate (DEPOTESTOSTERONE CYPIONATE) 200 MG/ML injection Inject 200 mg into the muscle every 14 (fourteen) days. 07/29/17   [provider]  traZODone (DESYREL) 50 MG tablet Take 25 mg by mouth at bedtime.    [provider]  venlafaxine XR (EFFEXOR-XR) 37.5 MG 24 hr capsule Take 1 capsule (37.5 mg total) by mouth daily. 08/01/17   Hillary Bow, MD  vitamin B-12 (CYANOCOBALAMIN) 1000 MCG tablet Take 1,000 mcg by mouth daily.    [provider]    Allergies Beta adrenergic blockers; Topiramate; Lisinopril; Nabumetone; and Valproic acid  FH Anesthesia problems Brother  Patient was told that his brother died under anesthesia during surgery when he was about 67 year old. Patient has no further information on this.  Coronary Artery Disease (Blocked arteries around heart) Brother    High blood pressure (Hypertension) Brother    Suicidality Brother    High blood pressure (Hypertension) Brother    Suicidality Cousin    Suicidality Cousin    Alzheimer's disease Mother    Other Mother    Diabetes Paternal Aunt       Social History Social History   Tobacco Use  . Smoking status: Never Smoker  . Smokeless tobacco:  Never Used  Substance Use Topics  . Alcohol use: Not on file  . Drug use: Never    Review of Systems  Constitutional: Negative for fever. + chills Eyes: Negative for visual changes. ENT: Negative for sore throat. Neck: No neck pain  Cardiovascular: Negative for chest pain. Respiratory: Negative for shortness of breath. Gastrointestinal: Negative for abdominal pain. + vomiting and diarrhea. Genitourinary: Negative for dysuria. Musculoskeletal: Negative for back pain. Skin: Negative for rash. Neurological: Negative for headaches, weakness or numbness. Psych: No SI or HI. + panic attack  ____________________________________________   PHYSICAL EXAM:  VITAL SIGNS: ED Triage Vitals [08/02/17 1852]  Enc Vitals Group     BP (!) 135/91     Pulse Rate (!) 114     Resp 16     Temp 99.5 F (37.5 C)     Temp Source Oral     SpO2 98 %     Weight 179 lb (81.2 kg)     Height 5\' 6"  (1.676 m)     Head Circumference      Peak Flow      Pain Score      Pain Loc  Pain Edu?      Excl. in [place]?     Constitutional: Alert and oriented. Well appearing and in no apparent distress. HEENT:      Head: Normocephalic and atraumatic.         Eyes: Conjunctivae are normal. Sclera is non-icteric.       Mouth/Throat: Mucous membranes are moist.       Neck: Supple with no signs of meningismus. Cardiovascular: Tachycardic with regular rhythm. No murmurs, gallops, or rubs. 2+ symmetrical distal pulses are present in all extremities. No JVD. Respiratory: Normal respiratory effort. Lungs are clear to auscultation bilaterally. No wheezes, crackles, or rhonchi.  Gastrointestinal: Soft, non tender, and non distended with positive bowel sounds. No rebound or guarding. Musculoskeletal: Nontender with normal range of motion in all extremities. No edema, cyanosis, or erythema of extremities. Neurologic: Normal speech and language. A & O x3, PERRL, EOMI, no nystagmus, CN II-XII intact, motor testing reveals  good tone and bulk throughout. There is no evidence of pronator drift or dysmetria. Muscle strength is 5/5 throughout. Sensory examination is intact. Gait deferred. Skin: Skin is warm, dry and intact. No rash noted. Psychiatric: Mood and affect are normal. Speech and behavior are normal.  ____________________________________________   LABS (all labs ordered are listed, but only abnormal results are displayed)  Labs Reviewed  CBC  COMPREHENSIVE METABOLIC PANEL  TROPONIN I   ____________________________________________  EKG  ED ECG REPORT I, Rudene Re, the attending physician, personally viewed and interpreted this ECG.  Sinus tachycardia, rate of 113, normal intervals, normal axis, no ST elevations or depressions.  Unchanged from prior from 3 days ago ____________________________________________  RADIOLOGY  I have personally reviewed the images performed during this visit and I agree with the Radiologist's read.   Interpretation by Radiologist:  Dg Chest 2 View  Result Date: 08/02/2017 CLINICAL DATA:  67 year old male with shortness of breath and chest pain. Recently admitted for TIA. EXAM: CHEST - 2 VIEW COMPARISON:  Portable chest 06/13/2006. FINDINGS: Seated AP and lateral views of the chest. Lung volumes are within normal limits. Mildly tortuous thoracic aorta. Normal cardiac size and mediastinal contours otherwise. No pneumothorax, pulmonary edema, pleural effusion or confluent pulmonary opacity. Visualized tracheal air column is within normal limits. Chronic right lateral rib fractures. Negative visible bowel gas pattern. IMPRESSION: No acute cardiopulmonary abnormality. Electronically Signed   By: Genevie Ann M.D.   On: 08/02/2017 19:29   Ct Head Wo Contrast  Result Date: 08/02/2017 CLINICAL DATA:  Episode of full-body jerking. Chronic headache. Normal neurologic examination. EXAM: CT HEAD WITHOUT CONTRAST TECHNIQUE: Contiguous axial images were obtained from the base of  the skull through the vertex without intravenous contrast. COMPARISON:  07/30/2017 head CT. FINDINGS: Brain: No evidence of parenchymal hemorrhage or extra-axial fluid collection. No mass lesion, mass effect, or midline shift. No CT evidence of acute infarction. Nonspecific mild subcortical and periventricular white matter hypodensity, most in keeping with chronic small vessel ischemic change. Cerebral volume is age appropriate. No ventriculomegaly. Vascular: No acute abnormality. Skull: No evidence of calvarial fracture. Sinuses/Orbits: The visualized paranasal sinuses are essentially clear. Other:  The mastoid air cells are unopacified. IMPRESSION: 1. No evidence of acute intracranial abnormality. No evidence of calvarial fracture. 2. Mild chronic small vessel ischemic changes in the cerebral white matter. Electronically Signed   By: Ilona Sorrel M.D.   On: 08/02/2017 20:27     ____________________________________________   PROCEDURES  Procedure(s) performed: None Procedures Critical Care performed:  None ____________________________________________  INITIAL IMPRESSION / ASSESSMENT AND PLAN / ED COURSE  67 y.o. male with a history of migraine headaches, CVA, hypertension, AVM of the brain, diabetes who presents for evaluation of nausea, vomiting, diarrhea after eating spoiled peanut butter and exacerbation of chronic headache.  Wife reports that she brought patient to the emergency room because she was concerned he had a seizure.  It seems to me the patient had a panic attack where he was hyperventilating and shaking all over but at the same time speaking with his family with no loss of consciousness.  Patient has a history of severe panic attacks.  He has no shortness of breath or chest pain at this time.  He is completely neurologically intact.  He does have a history of an AVM malformation therefore we will repeat a head CT to rule out any bleed.  He is followed by neurosurgery for this as an  outpatient.  His presentation is concerning for food poisoning.  Will give IV fluids and Reglan, check basic labs to rule out severe dehydration, acute kidney injury, or electrolyte abnormalities.       As part of my medical decision making, I reviewed the following data within the [place] notes reviewed and incorporated, Labs reviewed , EKG interpreted , Old EKG reviewed, Old chart reviewed, Radiograph reviewed , Notes from prior ED visits and [place] Controlled Substance Database    Pertinent labs & imaging results that were available during my care of the patient were reviewed by me and considered in my medical decision making (see chart for details).    ____________________________________________   FINAL CLINICAL IMPRESSION(S) / ED DIAGNOSES  Final diagnoses:  Food poisoning, accidental or unintentional, initial encounter  Panic attack  Acute nonintractable headache, unspecified headache type      NEW MEDICATIONS STARTED DURING THIS VISIT:  ED Discharge Orders        Ordered    butalbital-acetaminophen-caffeine (FIORICET, ESGIC) 50-325-40 MG tablet  Every 6 hours PRN     08/02/17 2040    ondansetron (ZOFRAN ODT) 4 MG disintegrating tablet  Every 8 hours PRN     08/02/17 2040       Note:  This document was prepared using Dragon voice recognition software and may include unintentional dictation errors.    Rudene Re, MD 08/02/17 2041

## 2017-08-02 NOTE — ED Notes (Signed)
Pt reports feeling better at this time and continues to report he feels safe being discharged. MD made aware. Family agrees with plan to discharge pt.

## 2017-08-02 NOTE — Discharge Instructions (Signed)

## 2017-08-05 NOTE — Discharge Summary (Signed)
[facility] [place] NAME: Jon [name]    MR#:  [id number]  DATE OF BIRTH:  August 08, 1950  DATE OF ADMISSION:  07/30/2017 ADMITTING PHYSICIAN: Epifanio Lesches, MD  DATE OF DISCHARGE: 08/01/2017  4:30 PM  PRIMARY CARE PHYSICIAN: Cooner, Deanne Coffer, MD   ADMISSION DIAGNOSIS:  TIA (transient ischemic attack) [G45.9]  DISCHARGE DIAGNOSIS:  Active Problems:   Headache   SECONDARY DIAGNOSIS:  History reviewed. No pertinent past medical history.   ADMITTING HISTORY  HISTORY OF PRESENT ILLNESS:  Jon [name]  is a 67 y.o. male with a known history of pituitary tumor comes in because of hypertension, hyperlipidemia came in because of slurred speech, word finding difficulty, IV.  Soft and non-.  According to patient and his wife BP was 160/90 at home and he also noted to have difficulty getting the correct words out.  No weakness of hands or legs.  Patient has history of pituitary tumor, depression, pseudoaneurysm of subclavian artery, AV malformation of the brain.  Patient complains of headache.  He denies any double vision or blurred vision.  He states that he has chronic nausea, chronic headaches.  Patient follows up at [facility] regarding his brain tumor.  HOSPITAL COURSE:   *Complicated migraine.  Chronic and recurrent symptoms at home. MRI shows no acute CVA.  Discussed with Dr. Irish Elders of neurology. Toradol, Decadron, magnesium. By day of discharge patient's headache is improved.  Discharged home with Tylenol and amitriptyline.  Patient has been on Effexor at home.  He has been using a dose on his own.  Prescriptions given for low-dose Effexor.  He will discuss with his primary care physician regarding stopping this or continuing.  *Hypertension.  Continue home medications.  *Diabetes mellitus.    Elevated in the hospital due to IV steroids.  Patient stable for discharge home to follow-up with neurology as  outpatient.    CONSULTS OBTAINED:  Treatment Team:  Leotis Pain, MD  DRUG ALLERGIES:   Allergies  Allergen Reactions  . Beta Adrenergic Blockers Itching  . Topiramate Nausea And Vomiting    Other reaction(s): Dizziness, Hallucination Other Reaction: violence  . Lisinopril Itching    Other reaction(s): Other (See Comments) Leg pain  . Nabumetone     Other reaction(s): Other (See Comments)  . Valproic Acid     Other reaction(s): Headache    DISCHARGE MEDICATIONS:   Allergies as of 08/01/2017      Reactions   Beta Adrenergic Blockers Itching   Topiramate Nausea And Vomiting   Other reaction(s): Dizziness, Hallucination Other Reaction: violence   Lisinopril Itching   Other reaction(s): Other (See Comments) Leg pain   Nabumetone    Other reaction(s): Other (See Comments)   Valproic Acid    Other reaction(s): Headache      Medication List    TAKE these medications   amitriptyline 10 MG tablet Commonly known as:  ELAVIL Take 1 tablet (10 mg total) by mouth at bedtime.   aspirin EC 81 MG tablet Take 81 mg by mouth daily.   atorvastatin 80 MG tablet Commonly known as:  LIPITOR Take 80 mg by mouth daily.   D3-1000 1000 units tablet Generic drug:  Cholecalciferol Take 1,000 Units by mouth daily.   gabapentin 100 MG capsule Commonly known as:  NEURONTIN Take 200 mg by mouth 3 (three) times daily.   glimepiride 1 MG tablet Commonly known as:  AMARYL Take 1 mg by mouth daily.   hydrOXYzine 10 MG  tablet Commonly known as:  ATARAX/VISTARIL Take 1 tablet by mouth daily.   losartan 100 MG tablet Commonly known as:  COZAAR Take 100 mg by mouth daily.   Melatonin 3 MG Tabs Take 6 mg by mouth at bedtime.   metoprolol succinate 50 MG 24 hr tablet Commonly known as:  TOPROL-XL Take 75 mg by mouth daily.   pantoprazole 40 MG tablet Commonly known as:  PROTONIX Take 40 mg by mouth daily.   testosterone cypionate 200 MG/ML injection Commonly known as:   DEPOTESTOSTERONE CYPIONATE Inject 200 mg into the muscle every 14 (fourteen) days.   traZODone 50 MG tablet Commonly known as:  DESYREL Take 25 mg by mouth at bedtime.   venlafaxine XR 37.5 MG 24 hr capsule Commonly known as:  EFFEXOR-XR Take 1 capsule (37.5 mg total) by mouth daily. What changed:    medication strength  how much to take   vitamin B-12 1000 MCG tablet Commonly known as:  CYANOCOBALAMIN Take 1,000 mcg by mouth daily.       Today   VITAL SIGNS:  Blood pressure 118/73, pulse 73, temperature 98.2 F (36.8 C), temperature source Oral, resp. rate 18, height 5\' 6"  (1.676 m), weight 81.2 kg (179 lb), SpO2 96 %.  I/O:  No intake or output data in the 24 hours ending 08/05/17 1704  PHYSICAL EXAMINATION:  Physical Exam  GENERAL:  67 y.o.-year-old patient lying in the bed with no acute distress.  LUNGS: Normal breath sounds bilaterally, no wheezing, rales,rhonchi or crepitation. No use of accessory muscles of respiration.  CARDIOVASCULAR: S1, S2 normal. No murmurs, rubs, or gallops.  ABDOMEN: Soft, non-tender, non-distended. Bowel sounds present. No organomegaly or mass.  NEUROLOGIC: Moves all 4 extremities. PSYCHIATRIC: The patient is alert and oriented x 3.  SKIN: No obvious rash, lesion, or ulcer.   DATA REVIEW:   CBC Recent Labs  Lab 08/02/17 2020  WBC 10.2  HGB 15.6  HCT 45.0  PLT 179    Chemistries  Recent Labs  Lab 08/02/17 2020  NA 135  K 4.1  CL 104  CO2 21*  GLUCOSE 175*  BUN 27*  CREATININE 0.99  CALCIUM 8.7*  AST 35  ALT 29  ALKPHOS 66  BILITOT 1.1    Cardiac Enzymes Recent Labs  Lab 08/02/17 2020  TROPONINI <0.03    Microbiology Results  No results found for this or any previous visit.  RADIOLOGY:  No results found.  Follow up with PCP in 1 week.  Management plans discussed with the patient, family and they are in agreement.  CODE STATUS:  Code Status History    Date Active Date Inactive Code Status Order  ID Comments User Context   07/30/2017 1703 08/01/2017 1937 Full Code [id number]  Epifanio Lesches, MD ED      TOTAL TIME TAKING CARE OF THIS PATIENT ON DAY OF DISCHARGE: more than 30 minutes.   Leia Alf Maahir Horst M.D on 08/05/2017 at 5:04 PM  Between 7am to 6pm - Pager - [phone]  After 6pm go to [email or web address] - password [email or web address] [facility]  Office  [phone]  CC: Primary care physician; Penne Lash, Deanne Coffer, MD  Note: This dictation was prepared with Dragon dictation along with smaller phrase technology. Any transcriptional errors that result from this process are unintentional.

## 2017-08-07 ENCOUNTER — Other Ambulatory Visit: Payer: Self-pay

## 2017-08-07 ENCOUNTER — Emergency Department: Payer: BLUE CROSS/BLUE SHIELD

## 2017-08-07 ENCOUNTER — Encounter: Payer: Self-pay | Admitting: Emergency Medicine

## 2017-08-07 DIAGNOSIS — R51 Headache: Secondary | ICD-10-CM | POA: Diagnosis present

## 2017-08-07 DIAGNOSIS — Z8673 Personal history of transient ischemic attack (TIA), and cerebral infarction without residual deficits: Secondary | ICD-10-CM | POA: Insufficient documentation

## 2017-08-07 DIAGNOSIS — K0889 Other specified disorders of teeth and supporting structures: Secondary | ICD-10-CM | POA: Insufficient documentation

## 2017-08-07 DIAGNOSIS — Z7984 Long term (current) use of oral hypoglycemic drugs: Secondary | ICD-10-CM | POA: Insufficient documentation

## 2017-08-07 DIAGNOSIS — Z7982 Long term (current) use of aspirin: Secondary | ICD-10-CM | POA: Insufficient documentation

## 2017-08-07 DIAGNOSIS — G43809 Other migraine, not intractable, without status migrainosus: Secondary | ICD-10-CM | POA: Insufficient documentation

## 2017-08-07 DIAGNOSIS — Z79899 Other long term (current) drug therapy: Secondary | ICD-10-CM | POA: Insufficient documentation

## 2017-08-07 DIAGNOSIS — E119 Type 2 diabetes mellitus without complications: Secondary | ICD-10-CM | POA: Diagnosis not present

## 2017-08-07 DIAGNOSIS — I10 Essential (primary) hypertension: Secondary | ICD-10-CM | POA: Insufficient documentation

## 2017-08-07 LAB — CBC
HCT: 44.9 % (ref 40.0–52.0)
Hemoglobin: 15.5 g/dL (ref 13.0–18.0)
MCH: 31.5 pg (ref 26.0–34.0)
MCHC: 34.5 g/dL (ref 32.0–36.0)
MCV: 91.3 fL (ref 80.0–100.0)
Platelets: 256 10*3/uL (ref 150–440)
RBC: 4.92 MIL/uL (ref 4.40–5.90)
RDW: 14.1 % (ref 11.5–14.5)
WBC: 7.3 10*3/uL (ref 3.8–10.6)

## 2017-08-07 LAB — DIFFERENTIAL
Basophils Absolute: 0.1 10*3/uL (ref 0–0.1)
Basophils Relative: 1 %
Eosinophils Absolute: 0.1 10*3/uL (ref 0–0.7)
Eosinophils Relative: 2 %
Lymphocytes Relative: 22 %
Lymphs Abs: 1.6 10*3/uL (ref 1.0–3.6)
Monocytes Absolute: 0.6 10*3/uL (ref 0.2–1.0)
Monocytes Relative: 8 %
Neutro Abs: 4.9 10*3/uL (ref 1.4–6.5)
Neutrophils Relative %: 67 %

## 2017-08-07 LAB — PROTIME-INR
INR: 0.88
Prothrombin Time: 11.9 seconds (ref 11.4–15.2)

## 2017-08-07 LAB — APTT: aPTT: 32 seconds (ref 24–36)

## 2017-08-07 NOTE — ED Triage Notes (Signed)
Pt reports sudden onset of headache to right temple that started about 1 hour ago; pt here last weekend and admitted for TIA; pt reports also having toothache on the right bottom side of his mouth; that's where pain started; blood pressure at home 160/100; pt restless in triage; having difficulty at times speaking; wife says pt was fine earlier today-speech changed about 1045 when he became hypertensive

## 2017-08-08 ENCOUNTER — Emergency Department: Payer: BLUE CROSS/BLUE SHIELD

## 2017-08-08 ENCOUNTER — Emergency Department
Admission: EM | Admit: 2017-08-08 | Discharge: 2017-08-08 | Disposition: A | Discharge status: Home / Self Care | Payer: BLUE CROSS/BLUE SHIELD | Attending: Emergency Medicine | Admitting: Emergency Medicine

## 2017-08-08 ENCOUNTER — Encounter: Payer: Self-pay | Admitting: Radiology

## 2017-08-08 DIAGNOSIS — G43809 Other migraine, not intractable, without status migrainosus: Secondary | ICD-10-CM

## 2017-08-08 DIAGNOSIS — K0889 Other specified disorders of teeth and supporting structures: Secondary | ICD-10-CM

## 2017-08-08 HISTORY — DX: Essential (primary) hypertension: I10

## 2017-08-08 HISTORY — DX: Transient cerebral ischemic attack, unspecified: G45.9

## 2017-08-08 HISTORY — DX: Type 2 diabetes mellitus without complications: E11.9

## 2017-08-08 LAB — COMPREHENSIVE METABOLIC PANEL
ALT: 30 U/L (ref 17–63)
AST: 28 U/L (ref 15–41)
Albumin: 4.1 g/dL (ref 3.5–5.0)
Alkaline Phosphatase: 69 U/L (ref 38–126)
Anion gap: 10 (ref 5–15)
BUN: 15 mg/dL (ref 6–20)
CO2: 24 mmol/L (ref 22–32)
Calcium: 9.2 mg/dL (ref 8.9–10.3)
Chloride: 101 mmol/L (ref 101–111)
Creatinine, Ser: 0.87 mg/dL (ref 0.61–1.24)
GFR calc Af Amer: >60 mL/min (ref >60)
GFR calc non Af Amer: >60 mL/min (ref >60)
Glucose, Bld: 234 mg/dL — ABNORMAL HIGH (ref 65–99)
Potassium: 4.3 mmol/L (ref 3.5–5.1)
Sodium: 135 mmol/L (ref 135–145)
Total Bilirubin: 0.4 mg/dL (ref 0.3–1.2)
Total Protein: 7.3 g/dL (ref 6.5–8.1)

## 2017-08-08 LAB — TROPONIN I: Troponin I: <0.03 ng/mL (ref <0.03)

## 2017-08-08 MED ORDER — PROCHLORPERAZINE EDISYLATE 5 MG/ML IJ SOLN
10.0000 mg | Freq: Once | INTRAMUSCULAR | Status: AC
  Administered 2017-08-08: 10 mg via INTRAVENOUS
  Filled 2017-08-08: qty 2

## 2017-08-08 MED ORDER — IOHEXOL 350 MG/ML SOLN
75.0000 mL | Freq: Once | INTRAVENOUS | Status: AC | PRN
  Administered 2017-08-08: 75 mL via INTRAVENOUS

## 2017-08-08 MED ORDER — BUPIVACAINE HCL 0.5 % IJ SOLN
30.0000 mL | Freq: Once | INTRAMUSCULAR | Status: AC
  Administered 2017-08-08: 30 mL
  Filled 2017-08-08: qty 30

## 2017-08-08 MED ORDER — KETOROLAC TROMETHAMINE 30 MG/ML IJ SOLN
15.0000 mg | Freq: Once | INTRAMUSCULAR | Status: AC
  Administered 2017-08-08: 15 mg via INTRAVENOUS
  Filled 2017-08-08: qty 1

## 2017-08-08 MED ORDER — DIPHENHYDRAMINE HCL 50 MG/ML IJ SOLN
50.0000 mg | Freq: Once | INTRAMUSCULAR | Status: AC
  Administered 2017-08-08: 50 mg via INTRAVENOUS
  Filled 2017-08-08: qty 1

## 2017-08-08 NOTE — Discharge Instructions (Signed)
Please begin taking magnesium supplementation 1-2 times a day to help prevent these migraine headaches from happening.  Keep your follow-up with both neurology and your primary care physician as scheduled and return to the emergency department for any concerns whatsoever.  It was a pleasure to take care of you today, and thank you for coming to our emergency department.  If you have any questions or concerns before leaving please ask the nurse to grab me and I'm more than happy to go through your aftercare instructions again.  If you were prescribed any opioid pain medication today such as Norco, Vicodin, Percocet, morphine, hydrocodone, or oxycodone please make sure you do not drive when you are taking this medication as it can alter your ability to drive safely.  If you have any concerns once you are home that you are not improving or are in fact getting worse before you can make it to your follow-up appointment, please do not hesitate to call 911 and come back for further evaluation.  Darel Hong, MD  Results for orders placed or performed during the hospital encounter of 08/08/17  Protime-INR  Result Value Ref Range   Prothrombin Time 11.9 11.4 - 15.2 seconds   INR 0.88   APTT  Result Value Ref Range   aPTT 32 24 - 36 seconds  CBC  Result Value Ref Range   WBC 7.3 3.8 - 10.6 K/uL   RBC 4.92 4.40 - 5.90 MIL/uL   Hemoglobin 15.5 13.0 - 18.0 g/dL   HCT 44.9 40.0 - 52.0 %   MCV 91.3 80.0 - 100.0 fL   MCH 31.5 26.0 - 34.0 pg   MCHC 34.5 32.0 - 36.0 g/dL   RDW 14.1 11.5 - 14.5 %   Platelets 256 150 - 440 K/uL  Differential  Result Value Ref Range   Neutrophils Relative % 67 %   Neutro Abs 4.9 1.4 - 6.5 K/uL   Lymphocytes Relative 22 %   Lymphs Abs 1.6 1.0 - 3.6 K/uL   Monocytes Relative 8 %   Monocytes Absolute 0.6 0.2 - 1.0 K/uL   Eosinophils Relative 2 %   Eosinophils Absolute 0.1 0 - 0.7 K/uL   Basophils Relative 1 %   Basophils Absolute 0.1 0 - 0.1 K/uL  Comprehensive  metabolic panel  Result Value Ref Range   Sodium 135 135 - 145 mmol/L   Potassium 4.3 3.5 - 5.1 mmol/L   Chloride 101 101 - 111 mmol/L   CO2 24 22 - 32 mmol/L   Glucose, Bld 234 (H) 65 - 99 mg/dL   BUN 15 6 - 20 mg/dL   Creatinine, Ser 0.87 0.61 - 1.24 mg/dL   Calcium 9.2 8.9 - 10.3 mg/dL   Total Protein 7.3 6.5 - 8.1 g/dL   Albumin 4.1 3.5 - 5.0 g/dL   AST 28 15 - 41 U/L   ALT 30 17 - 63 U/L   Alkaline Phosphatase 69 38 - 126 U/L   Total Bilirubin 0.4 0.3 - 1.2 mg/dL   GFR calc non Af Amer >60 >60 mL/min   GFR calc Af Amer >60 >60 mL/min   Anion gap 10 5 - 15  Troponin I  Result Value Ref Range   Troponin I <0.03 <0.03 ng/mL   Ct Angio Head W Or Wo Contrast  Result Date: 08/08/2017 CLINICAL DATA:  Acute onset RIGHT temporal headache 1 hour ago. Recent hospital admission for TIA. EXAM: CT ANGIOGRAPHY HEAD AND NECK TECHNIQUE: Multidetector CT imaging of the  head and neck was performed using the standard protocol during bolus administration of intravenous contrast. Multiplanar CT image reconstructions and MIPs were obtained to evaluate the vascular anatomy. Carotid stenosis measurements (when applicable) are obtained utilizing NASCET criteria, using the distal internal carotid diameter as the denominator. CONTRAST:  26mL OMNIPAQUE IOHEXOL 350 MG/ML SOLN COMPARISON:  CT HEAD August 07, 2017 and CT angiogram head and neck July 30, 2017. MRI of the head June 15, 2006 FINDINGS: CTA NECK FINDINGS: AORTIC ARCH: Normal appearance of the thoracic arch, normal branch pattern. Trace calcific atherosclerosis aortic arch. 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 RIGHT CAROTID SYSTEM: Common carotid artery is widely patent, coursing in a straight line fashion. Normal appearance of the carotid bifurcation without hemodynamically significant stenosis by NASCET criteria. Normal appearance of the internal  carotid artery. LEFT CAROTID SYSTEM: Common carotid artery is widely patent, coursing in a straight line fashion. Trace calcific atherosclerosis carotid bifurcation without hemodynamically significant stenosis by NASCET criteria. Normal appearance of the internal carotid artery. VERTEBRAL ARTERIES:LEFT vertebral artery arises from pseudoaneurysm with moderate origin stenosis. RIGHT vertebral artery is dominant. SKELETON: No acute osseous process though bone windows have not been submitted. Degenerative change of the cervical spine resulting in severe LEFT C5-6 neural foraminal narrowing. OTHER NECK: Soft tissues of the neck are nonacute though, not tailored for evaluation. UPPER CHEST: Included lung apices are clear. No superior mediastinal lymphadenopathy. CTA HEAD FINDINGS: ANTERIOR CIRCULATION: Patent cervical internal carotid arteries, petrous, cavernous and supra clinoid internal carotid arteries, trace calcific atherosclerosis carotid siphon. Patent anterior communicating artery. Patent anterior and middle cerebral arteries. No large vessel occlusion, significant stenosis, contrast extravasation or aneurysm. POSTERIOR CIRCULATION: Patent vertebral arteries, vertebrobasilar junction and basilar artery, as well as main branch vessels. Patent posterior cerebral arteries. No large vessel occlusion, significant stenosis, contrast extravasation or aneurysm. VENOUS SINUSES: Major dural venous sinuses are patent though not tailored for evaluation on this angiographic examination. ANATOMIC VARIANTS: Faint blush of contrast enhancement LEFT posterior temporal lobe associated with large draining veins stable from prior imaging. DELAYED PHASE: Faint enhancement LEFT posterior temporal lobe corresponding to vascular anomaly. MIP images reviewed. IMPRESSION: CTA NECK: 1. 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 2. No carotid artery hemodynamically significant stenosis. CTA HEAD: 1. No emergent large vessel occlusion or flow limiting stenosis. 2. 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 Neuro-Interventional Radiology consultation is suggested to evaluate the appropriateness of potential treatment. Non-emergent evaluation can be arranged by calling [phone] during usual hours. Aortic Atherosclerosis (ICD10-I70.0). Electronically Signed   By: Elon Alas M.D.   On: 08/08/2017 01:27   Ct Angio Head W Or Wo Contrast  Result Date: 07/30/2017 CLINICAL DATA:  67 y/o M; severe headache and speech difficulty. History of pseudoaneurysm. EXAM: CT ANGIOGRAPHY HEAD AND NECK TECHNIQUE: Multidetector CT imaging of the head and neck was performed using the standard protocol during bolus administration of intravenous contrast. Multiplanar CT image reconstructions and MIPs were obtained to evaluate the vascular anatomy. Carotid stenosis measurements (when applicable) are obtained utilizing NASCET criteria, using the distal internal carotid diameter as the denominator. CONTRAST:  15mL ISOVUE-370 IOPAMIDOL (ISOVUE-370) INJECTION 76% COMPARISON:  07/30/2017 CT head.  06/15/2006 MRI/MRA head. FINDINGS: CTA NECK FINDINGS Aortic arch: Standard branching. Imaged portion shows no evidence of aneurysm  or dissection. 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 Right carotid system: No evidence of dissection, stenosis (50% or greater) or occlusion. Left carotid system: No evidence of dissection, stenosis (50% or greater) or occlusion. Vertebral arteries: Outpouching of the vessel wall with flap involving left subclavian artery extends to the left vertebral artery origin there is calcified plaque resulting in moderate left vertebral  artery origin stenosis. Right dominant vertebral artery. The arteries in the neck or otherwise patent without dissection or stenosis. Skeleton: Moderate cervical spondylosis greatest at the C5-C7 levels or uncovertebral and facet hypertrophy result in left-greater-than-right foraminal encroachment. No high-grade canal stenosis. Other neck: Negative. Upper chest: Negative. Review of the MIP images confirms the above findings CTA HEAD FINDINGS Anterior circulation: No significant stenosis, proximal occlusion, or aneurysm. Mild non stenotic calcific atherosclerosis of carotid siphons. Blush of enhancement measuring 14 x 18 x 15 mm (AP x ML x CC series 6, image 266 and series 7, image 130) with prominent draining veins extending into an enlarged left cortical vein which extends over the left frontal lobe to the superior sagittal sinus anteriorly. Posterior circulation: No significant stenosis, proximal occlusion, aneurysm, or vascular malformation. Venous sinuses: As permitted by contrast timing, patent. Anatomic variants: Diminutive anterior communicating artery. Posterior communicating artery not identified, likely hypoplastic or absent. Delayed phase: Blush of enhancement in the left superolateral frontal lobe measuring 14 x 18 x 15 mm (AP x ML x CC series 6, image 266 and series 7, image 130) with prominent draining veins extending into an enlarged left cortical vein which extends over the left frontal lobe to the superior sagittal sinus anteriorly. Review of the MIP images confirms the above findings IMPRESSION: CTA neck: 1. Left subclavian artery vessel wall outpouching with flap possibly representing pseudoaneurysm or focal dissection measuring up to 19 mm extending to left vertebral artery origin. 2. Moderate left vertebral artery origin stenosis. 3. No significant stenosis, occlusion, or dissection of bilateral carotid systems and right vertebral artery. CTA head: 1. No large vessel occlusion, aneurysm, or  significant stenosis of the anterior or posterior intracranial circulation. 2. 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 These results were called by telephone at the time of interpretation on 07/30/2017 at 3:15 pm to Dr. Conni Slipper , who verbally acknowledged these results. Electronically Signed   By: Kristine Garbe M.D.   On: 07/30/2017 15:18   Dg Chest 2 View  Result Date: 08/02/2017 CLINICAL DATA:  67 year old male with shortness of breath and chest pain. Recently admitted for TIA. EXAM: CHEST - 2 VIEW COMPARISON:  Portable chest 06/13/2006. FINDINGS: Seated AP and lateral views of the chest. Lung volumes are within normal limits. Mildly tortuous thoracic aorta. Normal cardiac size and mediastinal contours otherwise. No pneumothorax, pulmonary edema, pleural effusion or confluent pulmonary opacity. Visualized tracheal air column is within normal limits. Chronic right lateral rib fractures. Negative visible bowel gas pattern. IMPRESSION: No acute cardiopulmonary abnormality. Electronically Signed   By: Genevie Ann M.D.   On: 08/02/2017 19:29   Ct Head Wo Contrast  Result Date: 08/08/2017 CLINICAL DATA:  Acute right temporal headache starting 1 hour prior to arrival. EXAM: CT HEAD WITHOUT CONTRAST TECHNIQUE: Contiguous axial  images were obtained from the base of the skull through the vertex without intravenous contrast. COMPARISON:  08/02/2017 FINDINGS: Brain: Chronic appearing subcortical and periventricular white matter hypodensities consistent with chronic small vessel ischemia. No large vascular territory infarct. No intracranial hemorrhage midline shift or edema. No intra-axial mass nor extra-axial fluid collections. Vascular: No hyperdense vessel sign. Skull: No calvarial fracture or suspicious osseous lesions.  Sinuses/Orbits: No acute finding. Other: Clear bilateral mastoids. IMPRESSION: No acute intracranial abnormality. Chronic small vessel ischemic disease. Electronically Signed   By: Ashley Royalty M.D.   On: 08/08/2017 00:14   Ct Head Wo Contrast  Result Date: 08/02/2017 CLINICAL DATA:  Episode of full-body jerking. Chronic headache. Normal neurologic examination. EXAM: CT HEAD WITHOUT CONTRAST TECHNIQUE: Contiguous axial images were obtained from the base of the skull through the vertex without intravenous contrast. COMPARISON:  07/30/2017 head CT. FINDINGS: Brain: No evidence of parenchymal hemorrhage or extra-axial fluid collection. No mass lesion, mass effect, or midline shift. No CT evidence of acute infarction. Nonspecific mild subcortical and periventricular white matter hypodensity, most in keeping with chronic small vessel ischemic change. Cerebral volume is age appropriate. No ventriculomegaly. Vascular: No acute abnormality. Skull: No evidence of calvarial fracture. Sinuses/Orbits: The visualized paranasal sinuses are essentially clear. Other:  The mastoid air cells are unopacified. IMPRESSION: 1. No evidence of acute intracranial abnormality. No evidence of calvarial fracture. 2. Mild chronic small vessel ischemic changes in the cerebral white matter. Electronically Signed   By: Ilona Sorrel M.D.   On: 08/02/2017 20:27   Ct Angio Neck W And/or Wo Contrast  Result Date: 08/08/2017 CLINICAL DATA:  Acute onset RIGHT temporal headache 1 hour ago. Recent hospital admission for TIA. EXAM: CT ANGIOGRAPHY HEAD AND NECK TECHNIQUE: Multidetector CT imaging of the head and neck was performed using the standard protocol during bolus administration of intravenous contrast. Multiplanar CT image reconstructions and MIPs were obtained to evaluate the vascular anatomy. Carotid stenosis measurements (when applicable) are obtained utilizing NASCET criteria, using the distal internal carotid diameter as the denominator.  CONTRAST:  11mL OMNIPAQUE IOHEXOL 350 MG/ML SOLN COMPARISON:  CT HEAD August 07, 2017 and CT angiogram head and neck July 30, 2017. MRI of the head June 15, 2006 FINDINGS: CTA NECK FINDINGS: AORTIC ARCH: Normal appearance of the thoracic arch, normal branch pattern. Trace calcific atherosclerosis aortic arch. 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 RIGHT CAROTID SYSTEM: Common carotid artery is widely patent, coursing in a straight line fashion. Normal appearance of the carotid bifurcation without hemodynamically significant stenosis by NASCET criteria. Normal appearance of the internal carotid artery. LEFT CAROTID SYSTEM: Common carotid artery is widely patent, coursing in a straight line fashion. Trace calcific atherosclerosis carotid bifurcation without hemodynamically significant stenosis by NASCET criteria. Normal appearance of the internal carotid artery. VERTEBRAL ARTERIES:LEFT vertebral artery arises from pseudoaneurysm with moderate origin stenosis. RIGHT vertebral artery is dominant. SKELETON: No acute osseous process though bone windows have not been submitted. Degenerative change of the cervical spine resulting in severe LEFT C5-6 neural foraminal narrowing. OTHER NECK: Soft tissues of the neck are nonacute though, not tailored for evaluation. UPPER CHEST: Included lung apices are clear. No superior mediastinal lymphadenopathy. CTA HEAD FINDINGS: ANTERIOR CIRCULATION: Patent cervical internal carotid arteries, petrous, cavernous and supra clinoid internal carotid arteries, trace calcific atherosclerosis carotid siphon. Patent anterior communicating artery. Patent anterior and middle cerebral arteries. No large vessel occlusion, significant  stenosis, contrast extravasation or aneurysm. POSTERIOR CIRCULATION: Patent vertebral arteries, vertebrobasilar junction and basilar artery, as well as main branch  vessels. Patent posterior cerebral arteries. No large vessel occlusion, significant stenosis, contrast extravasation or aneurysm. VENOUS SINUSES: Major dural venous sinuses are patent though not tailored for evaluation on this angiographic examination. ANATOMIC VARIANTS: Faint blush of contrast enhancement LEFT posterior temporal lobe associated with large draining veins stable from prior imaging. DELAYED PHASE: Faint enhancement LEFT posterior temporal lobe corresponding to vascular anomaly. MIP images reviewed. IMPRESSION: CTA NECK: 1. 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 2. No carotid artery hemodynamically significant stenosis. CTA HEAD: 1. No emergent large vessel occlusion or flow limiting stenosis. 2. 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 Neuro-Interventional Radiology consultation is suggested to evaluate the appropriateness of potential treatment. Non-emergent evaluation can be arranged by calling [phone] during usual hours. Aortic Atherosclerosis (ICD10-I70.0). Electronically Signed   By: Elon Alas M.D.   On: 08/08/2017 01:27   Ct Angio Neck W And/or Wo Contrast  Result Date: 07/30/2017 CLINICAL DATA:  67 y/o M; severe headache and speech difficulty. History of pseudoaneurysm. EXAM: CT ANGIOGRAPHY HEAD AND NECK TECHNIQUE: Multidetector CT imaging of the head and neck was performed using the standard protocol during bolus administration of intravenous contrast. Multiplanar CT image reconstructions and MIPs were obtained to evaluate the vascular anatomy. Carotid stenosis measurements (when applicable) are obtained utilizing NASCET criteria, using the distal internal carotid diameter as the denominator. CONTRAST:  28mL ISOVUE-370 IOPAMIDOL (ISOVUE-370) INJECTION 76% COMPARISON:  07/30/2017 CT head.  06/15/2006  MRI/MRA head. FINDINGS: CTA NECK FINDINGS Aortic arch: Standard branching. Imaged portion shows no evidence of aneurysm or dissection. 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 Right carotid system: No evidence of dissection, stenosis (50% or greater) or occlusion. Left carotid system: No evidence of dissection, stenosis (50% or greater) or occlusion. Vertebral arteries: Outpouching of the vessel wall with flap involving left subclavian artery extends to the left vertebral artery origin there is calcified plaque resulting in moderate left vertebral artery origin stenosis. Right dominant vertebral artery. The arteries in the neck or otherwise patent without dissection or stenosis. Skeleton: Moderate cervical spondylosis greatest at the C5-C7 levels or uncovertebral and facet hypertrophy result in left-greater-than-right foraminal encroachment. No high-grade canal stenosis. Other neck: Negative. Upper chest: Negative. Review of the MIP images confirms the above findings CTA HEAD FINDINGS Anterior circulation: No significant stenosis, proximal occlusion, or aneurysm. Mild non stenotic calcific atherosclerosis of carotid siphons. Blush of enhancement measuring 14 x 18 x 15 mm (AP x ML x CC series 6, image 266 and series 7, image 130) with prominent draining veins extending into an enlarged left cortical vein which extends over the left frontal lobe to the superior sagittal sinus anteriorly. Posterior circulation: No significant stenosis, proximal occlusion, aneurysm, or vascular malformation. Venous sinuses: As permitted by contrast timing, patent. Anatomic variants: Diminutive anterior communicating artery. Posterior communicating artery not identified, likely hypoplastic or absent. Delayed phase: Blush of enhancement in the left superolateral frontal lobe measuring 14 x 18 x 15 mm (AP x ML  x CC series 6, image 266 and series 7, image 130) with prominent draining veins extending into an enlarged left cortical vein which extends over the  left frontal lobe to the superior sagittal sinus anteriorly. Review of the MIP images confirms the above findings IMPRESSION: CTA neck: 1. Left subclavian artery vessel wall outpouching with flap possibly representing pseudoaneurysm or focal dissection measuring up to 19 mm extending to left vertebral artery origin. 2. Moderate left vertebral artery origin stenosis. 3. No significant stenosis, occlusion, or dissection of bilateral carotid systems and right vertebral artery. CTA head: 1. No large vessel occlusion, aneurysm, or significant stenosis of the anterior or posterior intracranial circulation. 2. 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 These results were called by telephone at the time of interpretation on 07/30/2017 at 3:15 pm to Dr. Conni Slipper , who verbally acknowledged these results. Electronically Signed   By: Kristine Garbe M.D.   On: 07/30/2017 15:18   Mr Brain Wo Contrast  Result Date: 07/31/2017 CLINICAL DATA:  TIA, initial exam. Headache. 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 Episode of slurred speech and word-finding difficulties. EXAM: MRI HEAD WITHOUT CONTRAST TECHNIQUE: Multiplanar, multiecho pulse sequences of the brain and surrounding structures were obtained without intravenous contrast. COMPARISON:  CT head and CTA head and neck 07/30/2017. MRI brain 06/15/2006 FINDINGS: Brain: Maximal dimension of the pituitary is 13 mm. This is increased from 10.5 mm in 2008. Moderate is periventricular and subcortical white matter changes are present bilaterally. There is no significant encephalomalacia associated  with the area of suspected AVM in the left temporal lobe prominent flow voids are evident. No acute infarct or hemorrhage is present. Susceptibility in the anterior left frontal lobe likely reflects remote hemorrhagic ischemia. The ventricles are of normal size. No significant extra-axial fluid collection is present. 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 Brainstem and cerebellum are otherwise normal. The internal auditory canals are normal bilaterally. Vascular: Flow is present in the major intracranial arteries. Prominent flow voids are present in the left temporal region. Skull and upper cervical spine: The skull base is within normal limits. The craniocervical junction is normal. The upper cervical spine is unremarkable. Sinuses/Orbits: The paranasal sinuses and mastoid air cells are clear. Globes and orbits are within normal limits. IMPRESSION: 1. No acute or subacute infarct to explain the patient's stroke like symptoms. 2. Atrophy and white matter disease is moderately advanced for age. This likely reflects the sequela of chronic microvascular ischemia. 3. 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 4. Focal susceptibility in the right frontal lobe measuring 8 mm is likely related to remote hemorrhagic ischemia. Electronically Signed   By: San Morelle M.D.   On: 07/31/2017 09:06   Ct Head Code Stroke Wo Contrast  Result Date: 07/30/2017 CLINICAL DATA:  Code stroke. 67 y/o M; severe headache and difficulty with speech. Left-sided weakness. History of TIA. EXAM: CT HEAD WITHOUT CONTRAST TECHNIQUE: Contiguous axial images were obtained from the base of the skull through the vertex without intravenous contrast. COMPARISON:  06/13/2006 CT head FINDINGS: Brain: No evidence of acute infarction, hemorrhage, hydrocephalus, extra-axial collection or mass lesion/mass effect. Nonspecific foci of hypoattenuation in subcortical and  periventricular white matter compatible with chronic microvascular ischemic changes and mildly progressed from 2008. There are very small chronic infarcts within the cerebellar hemispheres bilaterally and mild brain parenchymal volume loss. Vascular: Calcific atherosclerosis of carotid siphons.  No hyperdense vessel identified. Skull: Normal. Negative for fracture or focal lesion. Sinuses/Orbits: Bilateral intra-ocular lens replacement. Otherwise negative. Other: None. ASPECTS [facility] Stroke Program Early CT Score) - Ganglionic level infarction (caudate, lentiform nuclei, internal capsule, insula, M1-M3 cortex): 7 - Supraganglionic infarction (M4-M6 cortex): 3 Total score (0-10 with 10 being normal): 10 IMPRESSION: 1. No acute intracranial. 2. Mild interval progression of chronic microvascular ischemic changes and parenchymal volume loss of the brain. 3. ASPECTS is 10 These results were called by telephone at the time of interpretation on 07/30/2017 at 1:32 pm to Dr. Conni Slipper , who verbally acknowledged these results. Electronically Signed   By: Kristine Garbe M.D.   On: 07/30/2017 13:34

## 2017-08-08 NOTE — ED Notes (Signed)
Pt in CT at this time.

## 2017-08-08 NOTE — ED Provider Notes (Signed)
[facility] Emergency Department Provider Note  ____________________________________________   First MD Initiated Contact with Patient 08/08/17 0033     (approximate)  I have reviewed the triage vital signs and the nursing notes.   HISTORY  Chief Complaint Headache    HPI Jon [name] is a 67 y.o. male who comes to the emergency department with roughly 1 hour of sudden onset maximal onset severe throbbing headache.  He has had intermittent headaches for the past week or so and was actually admitted to our hospital for a TIA and was felt by neurology to have complex migraines.  He was discharged home, however has had daily headaches ever since.  Earlier today his wife noticed that the patient had a brief episode of slurred speech so she brought him to the emergency department for further evaluation.  He has had no fevers or chills.  He does report moderate to severe right lower molar pain.  No difficulty swallowing.  No numbness or weakness.  No double vision or blurred vision.  Nothing particular seems to make his symptoms better or worse.  Past Medical History:  Diagnosis Date  . Diabetes mellitus without complication ([place])   . Hypertension   . TIA (transient ischemic attack)     Patient Active Problem List   Diagnosis Date Noted  . Headache 07/30/2017    Past Surgical History:  Procedure Laterality Date  . COLONOSCOPY    . EYE SURGERY    . HERNIA REPAIR      Prior to Admission medications   Medication Sig Start Date End Date Taking? Authorizing Provider  amitriptyline (ELAVIL) 10 MG tablet Take 1 tablet (10 mg total) by mouth at bedtime. 08/01/17   Hillary Bow, MD  aspirin EC 81 MG tablet Take 81 mg by mouth daily.    [provider]  atorvastatin (LIPITOR) 80 MG tablet Take 80 mg by mouth daily. 07/01/17   [provider]  butalbital-acetaminophen-caffeine Emelda Brothers, ESGIC) 25-852-77 MG tablet Take 1-2 tablets by mouth  every 6 (six) hours as needed for headache. 08/02/17 08/02/18  Alfred Levins, [place], MD  Cholecalciferol (D3-1000) 1000 units tablet Take 1,000 Units by mouth daily.    [provider]  gabapentin (NEURONTIN) 100 MG capsule Take 200 mg by mouth 3 (three) times daily.    [provider]  glimepiride (AMARYL) 1 MG tablet Take 1 mg by mouth daily. 06/20/17   [provider]  hydrOXYzine (ATARAX/VISTARIL) 10 MG tablet Take 1 tablet by mouth daily. 07/01/17   [provider]  losartan (COZAAR) 100 MG tablet Take 100 mg by mouth daily. 06/14/17   [provider]  Melatonin 3 MG TABS Take 6 mg by mouth at bedtime.    [provider]  metoprolol succinate (TOPROL-XL) 50 MG 24 hr tablet Take 75 mg by mouth daily.  07/28/17   [provider]  ondansetron (ZOFRAN ODT) 4 MG disintegrating tablet Take 1 tablet (4 mg total) by mouth every 8 (eight) hours as needed for nausea or vomiting. 08/02/17   Alfred Levins, [place], MD  pantoprazole (PROTONIX) 40 MG tablet Take 40 mg by mouth daily. 06/27/17   [provider]  testosterone cypionate (DEPOTESTOSTERONE CYPIONATE) 200 MG/ML injection Inject 200 mg into the muscle every 14 (fourteen) days. 07/29/17   [provider]  traZODone (DESYREL) 50 MG tablet Take 25 mg by mouth at bedtime.    [provider]  venlafaxine XR (EFFEXOR-XR) 37.5 MG 24 hr capsule Take 1  capsule (37.5 mg total) by mouth daily. 08/01/17   Hillary Bow, MD  vitamin B-12 (CYANOCOBALAMIN) 1000 MCG tablet Take 1,000 mcg by mouth daily.    [provider]    Allergies Beta adrenergic blockers; Topiramate; Lisinopril; Nabumetone; and Valproic acid  History reviewed. No pertinent family history.  Social History Social History   Tobacco Use  . Smoking status: Never Smoker  . Smokeless tobacco: Never Used  Substance Use Topics  . Alcohol use: Never    Frequency: Never  . Drug use: Never    Review of  Systems Constitutional: No fever/chills Eyes: No visual changes. ENT: No sore throat. Cardiovascular: Denies chest pain. Respiratory: Denies shortness of breath. Gastrointestinal: No abdominal pain.  Positive for nausea, no vomiting.  No diarrhea.  No constipation. Genitourinary: Negative for dysuria. Musculoskeletal: Negative for back pain. Skin: Negative for rash. Neurological: Positive for headache   ____________________________________________   PHYSICAL EXAM:  VITAL SIGNS: ED Triage Vitals [08/07/17 2308]  Enc Vitals Group     BP (!) 144/89     Pulse Rate 92     Resp 20     Temp 98.2 F (36.8 C)     Temp Source Oral     SpO2 98 %     Weight 179 lb (81.2 kg)     Height 5\' 6"  (1.676 m)     Head Circumference      Peak Flow      Pain Score 8     Pain Loc      Pain Edu?      Excl. in [place]?     Constitutional: Alert and oriented x4 appears quite uncomfortable although nontoxic no diaphoresis speaks in full clear sentences Eyes: PERRL EOMI. mid range and brisk Head: Atraumatic. Nose: No congestion/rhinnorhea. Mouth/Throat: No trismus no dental tenderness to percussion Neck: No stridor.   Cardiovascular: Normal rate, regular rhythm. Grossly normal heart sounds.  Good peripheral circulation. Respiratory: Normal respiratory effort.  No retractions. Lungs CTAB and moving good air Gastrointestinal: Soft nontender Musculoskeletal: No lower extremity edema   Neurologic:  Normal speech and language. No gross focal neurologic deficits are appreciated. Skin:  Skin is warm, dry and intact. No rash noted. Psychiatric: Mood and affect are normal. Speech and behavior are normal.    ____________________________________________   DIFFERENTIAL includes but not limited to  Aortic dissection, cervical artery dissection, intracerebral hemorrhage, subarachnoid hemorrhage, stroke, complex migraine, dental pain ____________________________________________   LABS (all labs ordered  are listed, but only abnormal results are displayed)  Labs Reviewed  COMPREHENSIVE METABOLIC PANEL - Abnormal; Notable for the following components:      Result Value   Glucose, Bld 234 (*)    All other components within normal limits  PROTIME-INR  APTT  CBC  DIFFERENTIAL  TROPONIN I  CBG MONITORING, ED    Lab work reviewed by me with no acute disease __________________________________________  EKG  ED ECG REPORT I, Darel Hong, the attending physician, personally viewed and interpreted this ECG.  Date: 08/08/2017 EKG Time:  Rate: 89 Rhythm: normal sinus rhythm QRS Axis: normal Intervals: normal ST/T Wave abnormalities: normal Narrative Interpretation: no evidence of acute ischemia  ____________________________________________  RADIOLOGY  CT angios the head and neck reviewed by me with no acute disease ____________________________________________   PROCEDURES  Procedure(s) performed: Yes  Dental Block Date/Time: 08/08/2017 2:43 AM Performed by: Darel Hong, MD Authorized by: Darel Hong, MD   Consent:    Consent obtained:  Verbal   Consent  given by:  Patient   Risks discussed:  Nerve damage, swelling, unsuccessful block and pain Indications:    Indications: dental pain   Location:    Block type:  Inferior alveolar   Laterality:  Right Procedure details (see MAR for exact dosages):    Syringe type:  Aspirating dental syringe   Needle gauge:  25 G   Anesthetic injected:  Bupivacaine 0.5% w/o epi Post-procedure details:    Outcome:  Anesthesia achieved   Patient tolerance of procedure:  Tolerated well, no immediate complications    Critical Care performed: no  Observation: no ____________________________________________   INITIAL IMPRESSION / ASSESSMENT AND PLAN / ED COURSE  Pertinent labs & imaging results that were available during my care of the patient were reviewed by me and considered in my medical decision making (see chart for  details).  Patient arrives neuro intact.  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  Regarding his dental pain I performed a dental block with 0.5% bupivacaine which achieved complete anesthesia.  The patient's pain is resolved after Compazine Benadryl and Toradol and he slept comfortably.  I had a lengthy discussion with the patient and his wife regarding his diagnosis and the importance of maintaining his follow-up with neurology as scheduled.  Strict return precautions have been given and the patient is discharged home in improved condition.  He and his wife verbalized understanding and agreement with plan.      ____________________________________________   FINAL CLINICAL IMPRESSION(S) / ED DIAGNOSES  Final diagnoses:  Other migraine without status migrainosus, not intractable  Pain, dental      NEW MEDICATIONS STARTED DURING THIS VISIT:  Current Discharge Medication List       Note:  This document was prepared using Dragon voice recognition software and may include unintentional dictation errors.     Darel Hong, MD 08/08/17 [phone]

## 2018-01-04 DIAGNOSIS — R471 Dysarthria and anarthria: Secondary | ICD-10-CM | POA: Insufficient documentation

## 2018-01-04 DIAGNOSIS — G43009 Migraine without aura, not intractable, without status migrainosus: Secondary | ICD-10-CM | POA: Insufficient documentation

## 2018-01-04 DIAGNOSIS — R4789 Other speech disturbances: Secondary | ICD-10-CM | POA: Insufficient documentation

## 2018-06-02 ENCOUNTER — Other Ambulatory Visit: Payer: Self-pay

## 2018-06-02 ENCOUNTER — Emergency Department: Payer: Medicare Other

## 2018-06-02 ENCOUNTER — Observation Stay
Admission: EM | Admit: 2018-06-02 | Discharge: 2018-06-05 | Disposition: A | Discharge status: Home / Self Care | Payer: Medicare Other | Attending: Internal Medicine | Admitting: Internal Medicine

## 2018-06-02 DIAGNOSIS — Z7982 Long term (current) use of aspirin: Secondary | ICD-10-CM | POA: Diagnosis not present

## 2018-06-02 DIAGNOSIS — R51 Headache: Secondary | ICD-10-CM | POA: Diagnosis not present

## 2018-06-02 DIAGNOSIS — M50223 Other cervical disc displacement at C6-C7 level: Secondary | ICD-10-CM | POA: Insufficient documentation

## 2018-06-02 DIAGNOSIS — I10 Essential (primary) hypertension: Secondary | ICD-10-CM | POA: Diagnosis not present

## 2018-06-02 DIAGNOSIS — F329 Major depressive disorder, single episode, unspecified: Secondary | ICD-10-CM | POA: Insufficient documentation

## 2018-06-02 DIAGNOSIS — I639 Cerebral infarction, unspecified: Secondary | ICD-10-CM | POA: Diagnosis present

## 2018-06-02 DIAGNOSIS — I340 Nonrheumatic mitral (valve) insufficiency: Secondary | ICD-10-CM | POA: Insufficient documentation

## 2018-06-02 DIAGNOSIS — E785 Hyperlipidemia, unspecified: Secondary | ICD-10-CM | POA: Diagnosis not present

## 2018-06-02 DIAGNOSIS — R55 Syncope and collapse: Secondary | ICD-10-CM | POA: Diagnosis present

## 2018-06-02 DIAGNOSIS — Z79899 Other long term (current) drug therapy: Secondary | ICD-10-CM | POA: Diagnosis not present

## 2018-06-02 DIAGNOSIS — Q282 Arteriovenous malformation of cerebral vessels: Secondary | ICD-10-CM | POA: Insufficient documentation

## 2018-06-02 DIAGNOSIS — R079 Chest pain, unspecified: Secondary | ICD-10-CM

## 2018-06-02 DIAGNOSIS — K219 Gastro-esophageal reflux disease without esophagitis: Secondary | ICD-10-CM | POA: Diagnosis not present

## 2018-06-02 DIAGNOSIS — R531 Weakness: Secondary | ICD-10-CM | POA: Diagnosis not present

## 2018-06-02 DIAGNOSIS — Z8673 Personal history of transient ischemic attack (TIA), and cerebral infarction without residual deficits: Secondary | ICD-10-CM | POA: Diagnosis not present

## 2018-06-02 DIAGNOSIS — E119 Type 2 diabetes mellitus without complications: Secondary | ICD-10-CM

## 2018-06-02 DIAGNOSIS — H6691 Otitis media, unspecified, right ear: Secondary | ICD-10-CM | POA: Insufficient documentation

## 2018-06-02 LAB — CBC WITH DIFFERENTIAL/PLATELET
Abs Immature Granulocytes: 0.01 10*3/uL (ref 0.00–0.07)
Basophils Absolute: 0 10*3/uL (ref 0.0–0.1)
Basophils Relative: 0 %
Eosinophils Absolute: 0.1 10*3/uL (ref 0.0–0.5)
Eosinophils Relative: 2 %
HCT: 43.3 % (ref 39.0–52.0)
Hemoglobin: 14.5 g/dL (ref 13.0–17.0)
Immature Granulocytes: 0 %
Lymphocytes Relative: 25 %
Lymphs Abs: 1.5 10*3/uL (ref 0.7–4.0)
MCH: 31 pg (ref 26.0–34.0)
MCHC: 33.5 g/dL (ref 30.0–36.0)
MCV: 92.7 fL (ref 80.0–100.0)
Monocytes Absolute: 0.4 10*3/uL (ref 0.1–1.0)
Monocytes Relative: 7 %
Neutro Abs: 3.9 10*3/uL (ref 1.7–7.7)
Neutrophils Relative %: 66 %
Platelets: 218 10*3/uL (ref 150–400)
RBC: 4.67 MIL/uL (ref 4.22–5.81)
RDW: 12.5 % (ref 11.5–15.5)
WBC: 6 10*3/uL (ref 4.0–10.5)
nRBC: 0 % (ref 0.0–0.2)

## 2018-06-02 LAB — APTT: aPTT: 32 seconds (ref 24–36)

## 2018-06-02 LAB — COMPREHENSIVE METABOLIC PANEL
ALT: 25 U/L (ref 0–44)
AST: 27 U/L (ref 15–41)
Albumin: 4.4 g/dL (ref 3.5–5.0)
Alkaline Phosphatase: 51 U/L (ref 38–126)
Anion gap: 5 (ref 5–15)
BUN: 17 mg/dL (ref 8–23)
CO2: 27 mmol/L (ref 22–32)
Calcium: 9.2 mg/dL (ref 8.9–10.3)
Chloride: 102 mmol/L (ref 98–111)
Creatinine, Ser: 1.09 mg/dL (ref 0.61–1.24)
GFR calc Af Amer: >60 mL/min (ref >60)
GFR calc non Af Amer: >60 mL/min (ref >60)
Glucose, Bld: 211 mg/dL — ABNORMAL HIGH (ref 70–99)
Potassium: 3.8 mmol/L (ref 3.5–5.1)
Sodium: 134 mmol/L — ABNORMAL LOW (ref 135–145)
Total Bilirubin: 0.8 mg/dL (ref 0.3–1.2)
Total Protein: 7.2 g/dL (ref 6.5–8.1)

## 2018-06-02 LAB — GLUCOSE, CAPILLARY: Glucose-Capillary: 226 mg/dL — ABNORMAL HIGH (ref 70–99)

## 2018-06-02 LAB — TROPONIN I: Troponin I: <0.03 ng/mL (ref <0.03)

## 2018-06-02 LAB — ETHANOL: Alcohol, Ethyl (B): <10 mg/dL (ref <10)

## 2018-06-02 LAB — PROTIME-INR
INR: 0.99
Prothrombin Time: 13 seconds (ref 11.4–15.2)

## 2018-06-02 MED ORDER — NITROGLYCERIN 0.4 MG SL SUBL
0.4000 mg | SUBLINGUAL_TABLET | SUBLINGUAL | Status: DC | PRN
  Filled 2018-06-02: qty 1

## 2018-06-02 MED ORDER — METOCLOPRAMIDE HCL 5 MG/ML IJ SOLN
10.0000 mg | Freq: Once | INTRAMUSCULAR | Status: DC
  Filled 2018-06-02: qty 2

## 2018-06-02 MED ORDER — ASPIRIN 81 MG PO CHEW
324.0000 mg | CHEWABLE_TABLET | Freq: Once | ORAL | Status: AC
  Administered 2018-06-02: 324 mg via ORAL
  Filled 2018-06-02: qty 4

## 2018-06-02 MED ORDER — SODIUM CHLORIDE 0.9 % IV BOLUS
1000.0000 mL | Freq: Once | INTRAVENOUS | Status: AC
  Administered 2018-06-02: 1000 mL via INTRAVENOUS

## 2018-06-02 MED ORDER — DIPHENHYDRAMINE HCL 50 MG/ML IJ SOLN
25.0000 mg | Freq: Once | INTRAMUSCULAR | Status: DC
  Filled 2018-06-02: qty 1

## 2018-06-02 MED ORDER — BUTALBITAL-APAP-CAFFEINE 50-325-40 MG PO TABS
2.0000 | ORAL_TABLET | Freq: Once | ORAL | Status: AC
  Administered 2018-06-02: 2 via ORAL
  Filled 2018-06-02: qty 2

## 2018-06-02 NOTE — ED Notes (Signed)
Pt excluded from TPA tx due to bleeding.

## 2018-06-02 NOTE — ED Notes (Signed)
Pt reports L sided weakness x "a couple of days". Approximately on Monday or Tuesday.

## 2018-06-02 NOTE — ED Triage Notes (Signed)
Pt arrives to ed via POV, pt was pulled from car by first nurse and brought straight to room. Pt able to answer some questions but decreased LOC. Pt complains of chest pain and headache MD at bedside at this time

## 2018-06-02 NOTE — ED Provider Notes (Signed)
[facility] Emergency Department Provider Note  ____________________________________________  Time seen: Approximately 10:09 PM  I have reviewed the triage vital signs and the nursing notes.   HISTORY  Chief Complaint Chest Pain and Altered Mental Status    Level 5 Caveat: Portions of the History and Physical including HPI and review of systems are unable to be completely obtained due to patient being a poor historian   HPI Jon [name] is a 68 y.o. male with a history of hypertension diabetes TIA and cerebral AVM with prior hemorrhage who comes the ED due to syncope.  Wife reports a background the patient learned today that his best friend had suddenly died.  Prior to this the patient had been complaining of severe worsening of his chronic headache.  No recent trauma.  Also complaining of central chest tightness radiating to the shoulder and associated shortness of breath.  No aggravating or alleviating factors.  After learning of the friend's death, 20 minutes later, the patient stood up to walk and passed out.  He remained sluggish and confused afterward and felt like his chest pain got much worse afterward.      Past Medical History:  Diagnosis Date  . Diabetes mellitus without complication ([place])   . Hypertension   . TIA (transient ischemic attack)      Patient Active Problem List   Diagnosis Date Noted  . Headache 07/30/2017     Past Surgical History:  Procedure Laterality Date  . COLONOSCOPY    . EYE SURGERY    . HERNIA REPAIR       Prior to Admission medications   Medication Sig Start Date End Date Taking? Authorizing Provider  amitriptyline (ELAVIL) 10 MG tablet Take 1 tablet (10 mg total) by mouth at bedtime. 08/01/17   Hillary Bow, MD  aspirin EC 81 MG tablet Take 81 mg by mouth daily.    [provider]  atorvastatin (LIPITOR) 80 MG tablet Take 80 mg by mouth daily. 07/01/17   [provider]   butalbital-acetaminophen-caffeine Emelda Brothers, ESGIC) 69-629-52 MG tablet Take 1-2 tablets by mouth every 6 (six) hours as needed for headache. 08/02/17 08/02/18  Alfred Levins, [place], MD  Cholecalciferol (D3-1000) 1000 units tablet Take 1,000 Units by mouth daily.    [provider]  gabapentin (NEURONTIN) 100 MG capsule Take 200 mg by mouth 3 (three) times daily.    [provider]  glimepiride (AMARYL) 1 MG tablet Take 1 mg by mouth daily. 06/20/17   [provider]  hydrOXYzine (ATARAX/VISTARIL) 10 MG tablet Take 1 tablet by mouth daily. 07/01/17   [provider]  losartan (COZAAR) 100 MG tablet Take 100 mg by mouth daily. 06/14/17   [provider]  Melatonin 3 MG TABS Take 6 mg by mouth at bedtime.    [provider]  metoprolol succinate (TOPROL-XL) 50 MG 24 hr tablet Take 75 mg by mouth daily.  07/28/17   [provider]  ondansetron (ZOFRAN ODT) 4 MG disintegrating tablet Take 1 tablet (4 mg total) by mouth every 8 (eight) hours as needed for nausea or vomiting. 08/02/17   Alfred Levins, [place], MD  pantoprazole (PROTONIX) 40 MG tablet Take 40 mg by mouth daily. 06/27/17   [provider]  testosterone cypionate (DEPOTESTOSTERONE CYPIONATE) 200 MG/ML injection Inject 200 mg into the muscle every 14 (fourteen) days. 07/29/17   [provider]  traZODone (DESYREL) 50 MG tablet Take 25 mg by mouth at bedtime.    [provider]  venlafaxine XR (EFFEXOR-XR) 37.5 MG 24 hr capsule Take 1 capsule (37.5 mg total) by mouth daily. 08/01/17   Hillary Bow, MD  vitamin B-12 (CYANOCOBALAMIN) 1000 MCG tablet Take 1,000 mcg by mouth daily.    [provider]     Allergies Beta adrenergic blockers; Topiramate; Lisinopril; Nabumetone; and Valproic acid   History reviewed. No pertinent family history.  Social History Social History   Tobacco Use  . Smoking status: Never Smoker  . Smokeless tobacco: Never Used   Substance Use Topics  . Alcohol use: Never    Frequency: Never  . Drug use: Never    Review of Systems Level 5 Caveat: Portions of the History and Physical including HPI and review of systems are unable to be completely obtained due to patient being a poor historian   Constitutional:   No known fever.  ENT:   No rhinorrhea. Cardiovascular:   Positive as above chest pain and syncope. Respiratory:   No dyspnea or cough. Gastrointestinal:   Negative for abdominal pain, vomiting and diarrhea.  Musculoskeletal:   Negative for focal pain or swelling ____________________________________________   PHYSICAL EXAM:  VITAL SIGNS: ED Triage Vitals  Enc Vitals Group     BP 06/02/18 2146 (!) 157/96     Pulse Rate 06/02/18 2142 85     Resp 06/02/18 2145 16     Temp 06/02/18 2142 98.4 F (36.9 C)     Temp Source 06/02/18 2142 Oral     SpO2 06/02/18 2142 100 %     Weight 06/02/18 2144 178 lb (80.7 kg)     Height 06/02/18 2144 5\' 6"  (1.676 m)     Head Circumference --      Peak Flow --      Pain Score 06/02/18 2143 5     Pain Loc --      Pain Edu? --      Excl. in [place]? --     Vital signs reviewed, nursing assessments reviewed.   Constitutional:   Awake, not alert.  Oriented to person..  Ill-appearing. Eyes:   Conjunctivae are normal. EOMI. PERRL. ENT      Head:   Normocephalic and atraumatic.      Nose:   No congestion/rhinnorhea.       Mouth/Throat:   MMM, no pharyngeal erythema. No peritonsillar mass.       Neck:   No meningismus. Full ROM. Hematological/Lymphatic/Immunilogical:   No cervical lymphadenopathy. Cardiovascular:   RRR. Symmetric bilateral radial and DP pulses.  No murmurs. Cap refill less than 2 seconds. Respiratory:   Normal respiratory effort without tachypnea/retractions. Breath sounds are clear and equal bilaterally. No wheezes/rales/rhonchi. Gastrointestinal:   Soft and nontender. Non distended. There is no CVA tenderness.  No rebound, rigidity, or  guarding. Musculoskeletal:   Normal range of motion in all extremities. No joint effusions.  No lower extremity tenderness.  No edema. Neurologic:   Normal speech and language.  Diffuse left-sided weakness.   Skin:    Skin is warm, dry and intact. No rash noted.  No petechiae, purpura, or bullae.  ____________________________________________    LABS (pertinent positives/negatives) (all labs ordered are listed, but only abnormal results are displayed) Labs Reviewed  GLUCOSE, CAPILLARY - Abnormal; Notable for the following components:      Result Value   Glucose-Capillary 226 (*)    All other components within normal limits  COMPREHENSIVE METABOLIC PANEL - Abnormal; Notable for the following components:   Sodium 134 (*)  Glucose, Bld 211 (*)    All other components within normal limits  TROPONIN I  CBC WITH DIFFERENTIAL/PLATELET  ETHANOL  PROTIME-INR  APTT  URINE DRUG SCREEN, QUALITATIVE ([facility] ONLY)  URINALYSIS, COMPLETE ([facility]) WITH MICROSCOPIC   ____________________________________________   EKG  Interpreted by me  Date: 06/02/2018  Rate: 86  Rhythm: normal sinus rhythm  QRS Axis: normal  Intervals: normal  ST/T Wave abnormalities: normal  Conduction Disutrbances: none  Narrative Interpretation: unremarkable      ____________________________________________    RADIOLOGY  Dg Chest Portable 1 View  Result Date: 06/02/2018 CLINICAL DATA:  Chest pain, syncope EXAM: PORTABLE CHEST 1 VIEW COMPARISON:  08/02/2017 FINDINGS: No acute opacity or pleural effusion. Stable cardiomediastinal silhouette. No pneumothorax. Old right-sided rib fractures. IMPRESSION: No active disease. Electronically Signed   By: Donavan Foil M.D.   On: 06/02/2018 22:23   Ct Head Code Stroke Wo Contrast`  Result Date: 06/02/2018 CLINICAL DATA:  Code stroke. 68 y/o M; decreased level of consciousness and headache. History of vascular malformation. EXAM: CT HEAD WITHOUT CONTRAST TECHNIQUE:  Contiguous axial images were obtained from the base of the skull through the vertex without intravenous contrast. COMPARISON:  08/07/2017 CT head. FINDINGS: Brain: No evidence of acute infarction, hemorrhage, hydrocephalus, extra-axial collection or mass lesion/mass effect. 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 Stable nonspecific white matter hypodensities compatible chronic microvascular ischemic changes and volume loss of the brain. Vascular: Calcific atherosclerosis of carotid siphons. No hyperdense vessel identified. Prominent cortical vein overlying the left lateral temporal and frontal lobe extending to the anterior aspect of the superior sagittal sinus draining the vascular malformation of the right lateral temporal lobe (series 2, image 14). No mass or hemorrhage effect associated with the vascular malformation. Skull: Normal. Negative for fracture or focal lesion. Sinuses/Orbits: No acute finding. Other: None. ASPECTS [facility] Stroke Program Early CT Score) - Ganglionic level infarction (caudate, lentiform nuclei, internal capsule, insula, M1-M3 cortex): 7 - Supraganglionic infarction (M4-M6 cortex): 3 Total score (0-10 with 10 being normal): 10 IMPRESSION: 1. No acute intracranial abnormality identified. 2. ASPECTS is 10 3. Stable chronic microvascular ischemic changes and volume loss of the brain. 4. 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 No mass effect or hemorrhage associated with the malformation. These results were called by telephone at the time of interpretation on 06/02/2018 at 10:28 pm to Dr. Carrie Mew , who verbally acknowledged these results. Electronically Signed   By: Kristine Garbe M.D.   On: 06/02/2018 22:33    ____________________________________________   PROCEDURES Procedures  ____________________________________________  DIFFERENTIAL DIAGNOSIS   Intracranial hemorrhage, stroke, non-STEMI,  paroxysmal dysrhythmia, dehydration, electrolyte disturbance  CLINICAL IMPRESSION / ASSESSMENT AND PLAN / ED COURSE  Pertinent labs & imaging results that were available during my care of the patient were reviewed by me and considered in my medical decision making (see chart for details).    Patient presents with acute on chronic headache in the setting of cerebral AVM, worrisome for intracranial hemorrhage.  He also has some lateralizing weakness worrisome for stroke.  Chest pain and syncope also concerning for possible cardiac event.  Will obtain labs, chest x-ray, CT head, plan for hospitalization for continued telemetry monitoring and further work-up.  Clinical Course as of Jun 02 2321  Fri Jun 02, 2018  2231 Ct head d/w rads, no ich or obvious infarct. H/o AVM appears due to small area of telangiectatic vascular malformation at dural surface of temporal lobe,  not a large vascular mass.   [PS]    Clinical Course User Index [PS] Carrie Mew, MD    ----------------------------------------- 11:22 PM on 06/02/2018 -----------------------------------------  Discussed with neurology who finds that symptoms of actually been ongoing for 2 days with left-sided weakness, recommends admission for further stroke work-up.  Discussed with hospitalist for further telemetry monitoring and evaluation.   ____________________________________________   FINAL CLINICAL IMPRESSION(S) / ED DIAGNOSES    Final diagnoses:  Ischemic stroke ([place])  Syncope, unspecified syncope type  Nonspecific chest pain     ED Discharge Orders    None      Portions of this note were generated with dragon dictation software. Dictation errors may occur despite best attempts at proofreading.   Carrie Mew, MD 06/02/18 [phone]

## 2018-06-02 NOTE — Consult Note (Signed)
[facility] Tele[facility]urology Consult Services   Date of Service:   06/02/2018 22:25:02  Impression:     .  RO Acute Ischemic Stroke  Comments: Patient with episode of left-sided weakness and sensory loss. Rule out lacunar infarct of the right hemispheric deep brain structures. Consider functional process and migraine, as well.  Metrics: Last Known Well: 05/31/2018 12:00:00 [facility] Notification Time: 06/02/2018 22:25:02 Arrival Time: 06/02/2018 21:36:00 Stamp Time: 06/02/2018 22:25:02 Time First Login Attempt: 06/02/2018 22:29:00 Video Start Time: 06/02/2018 22:29:00  Symptoms: left-sided weakness NIHSS Start Assessment Time: 06/02/2018 22:33:00 Patient is not a candidate for tPA. Patient was not deemed candidate for tPA thrombolytics because of LKW > 4.5h, prior history of ICH. Video End Time: 06/02/2018 22:36:00  CT head showed no acute hemorrhage or acute core infarct.  Presentation is not suggestive of Large Vessel Occlusive disease.  Advanced imaging was not obtained as the presentation was not suggestive of Large Vessel Occlusive Disease.   ED Physician notified of diagnostic impression and management plan on 06/02/2018 22:38:00  Our recommendations are outlined below.  Recommendations:     .  Activate Stroke Protocol Admission/Order Set     .  Stroke/Telemetry Floor     .  Neuro Checks     .  Bedside Swallow Eval     .  DVT Prophylaxis     .  IV Fluids, Normal Saline     .  Head of Bed Below 30 Degrees     .  Euglycemia and Avoid Hyperthermia (PRN Acetaminophen)     .  Antiplatelet Therapy Recommended  Routine Consultation with [facility] for Follow up Care  Sign Out:     .  Discussed with Emergency Department Provider    ------------------------------------------------------------------------------  History of Present Illness: Patient is a 68 year old Male.  Patient was brought by EMS for symptoms of left-sided  weakness  Pt with a history of left temporal AVM, prior ICH, HTN, HLD, chronic daily HA. He states his last normal was 2 days ago. Since then he's noted left-sided weakness. Had a syncopal episode tonight which prompted EMS transport. Had a stressful day as one of his best friends passed this morning. Had prior episodes of neuro symptoms with negative work ups.  CT head showed no acute hemorrhage or acute core infarct.    Examination: 1A: Level of Consciousness - Alert; keenly responsive + 0 1B: Ask Month and Age - Both Questions Right + 0 1C: Blink Eyes & Squeeze Hands - Performs Both Tasks + 0 2: Test Horizontal Extraocular Movements - Normal + 0 3: Test Visual Fields - No Visual Loss + 0 4: Test Facial Palsy (Use Grimace if Obtunded) - Normal symmetry + 0 5A: Test Left Arm Motor Drift - Drift, but doesn't hit bed + 1 5B: Test Right Arm Motor Drift - No Drift for 10 Seconds + 0 6A: Test Left Leg Motor Drift - No Drift for 5 Seconds + 0 6B: Test Right Leg Motor Drift - No Drift for 5 Seconds + 0 7: Test Limb Ataxia (FNF/Heel-Shin) - No Ataxia + 0 8: Test Sensation - Mild-Moderate Loss: Less Sharp/More Dull + 1 9: Test Language/Aphasia - Normal; No aphasia + 0 10: Test Dysarthria - Normal + 0 11: Test Extinction/Inattention - No abnormality + 0  NIHSS Score: 2  Patient was informed the Neurology Consult would happen via TeleHealth consult by way of interactive audio and video telecommunications and consented to receiving care in this manner.  Due to the immediate potential for life-threatening deterioration due to underlying acute neurologic illness, I spent 35 minutes providing critical care. This time includes time for face to face visit via telemedicine, review of medical records, imaging studies and discussion of findings with providers, the patient and/or family.   Dr Arne Cleveland   [facility] [phone]  Case [id number]

## 2018-06-03 ENCOUNTER — Observation Stay (HOSPITAL_BASED_OUTPATIENT_CLINIC_OR_DEPARTMENT_OTHER)
Admit: 2018-06-03 | Discharge: 2018-06-03 | Disposition: A | Discharge status: Home / Self Care | Payer: Medicare Other | Attending: Internal Medicine | Admitting: Internal Medicine

## 2018-06-03 ENCOUNTER — Observation Stay: Payer: Medicare Other

## 2018-06-03 DIAGNOSIS — I340 Nonrheumatic mitral (valve) insufficiency: Secondary | ICD-10-CM

## 2018-06-03 DIAGNOSIS — R55 Syncope and collapse: Secondary | ICD-10-CM | POA: Diagnosis not present

## 2018-06-03 DIAGNOSIS — R531 Weakness: Secondary | ICD-10-CM | POA: Diagnosis not present

## 2018-06-03 LAB — URINALYSIS, COMPLETE (UACMP) WITH MICROSCOPIC
Bacteria, UA: NONE SEEN
Bilirubin Urine: NEGATIVE
Glucose, UA: NEGATIVE mg/dL
Hgb urine dipstick: NEGATIVE
Ketones, ur: NEGATIVE mg/dL
Leukocytes, UA: NEGATIVE
Nitrite: NEGATIVE
Protein, ur: NEGATIVE mg/dL
Specific Gravity, Urine: 1.018 (ref 1.005–1.030)
WBC, UA: NONE SEEN WBC/hpf (ref 0–5)
pH: 6 (ref 5.0–8.0)

## 2018-06-03 LAB — HEMOGLOBIN A1C
Hgb A1c MFr Bld: 7.1 % — ABNORMAL HIGH (ref 4.8–5.6)
Mean Plasma Glucose: 157.07 mg/dL

## 2018-06-03 LAB — URINE DRUG SCREEN, QUALITATIVE (ARMC ONLY)
Amphetamines, Ur Screen: NOT DETECTED
Barbiturates, Ur Screen: POSITIVE — AB
Benzodiazepine, Ur Scrn: NOT DETECTED
Cannabinoid 50 Ng, Ur ~~LOC~~: NOT DETECTED
Cocaine Metabolite,Ur ~~LOC~~: NOT DETECTED
MDMA (Ecstasy)Ur Screen: NOT DETECTED
Methadone Scn, Ur: NOT DETECTED
Opiate, Ur Screen: NOT DETECTED
Phencyclidine (PCP) Ur S: NOT DETECTED
Tricyclic, Ur Screen: NOT DETECTED

## 2018-06-03 LAB — LIPID PANEL
Cholesterol: 173 mg/dL (ref 0–200)
HDL: 25 mg/dL — ABNORMAL LOW (ref >40)
LDL Cholesterol: 104 mg/dL — ABNORMAL HIGH (ref 0–99)
Total CHOL/HDL Ratio: 6.9 RATIO
Triglycerides: 220 mg/dL — ABNORMAL HIGH (ref <150)
VLDL: 44 mg/dL — ABNORMAL HIGH (ref 0–40)

## 2018-06-03 LAB — GLUCOSE, CAPILLARY
Glucose-Capillary: 139 mg/dL — ABNORMAL HIGH (ref 70–99)
Glucose-Capillary: 130 mg/dL — ABNORMAL HIGH (ref 70–99)
Glucose-Capillary: 167 mg/dL — ABNORMAL HIGH (ref 70–99)

## 2018-06-03 MED ORDER — DULOXETINE HCL 30 MG PO CPEP
60.0000 mg | ORAL_CAPSULE | Freq: Every day | ORAL | Status: DC
  Administered 2018-06-03 – 2018-06-05 (×3): 60 mg via ORAL
  Filled 2018-06-03 (×3): qty 2

## 2018-06-03 MED ORDER — BUTALBITAL-APAP-CAFFEINE 50-325-40 MG PO TABS
1.0000 | ORAL_TABLET | Freq: Four times a day (QID) | ORAL | Status: DC | PRN
  Administered 2018-06-03 – 2018-06-04 (×2): 1 via ORAL
  Filled 2018-06-03 (×3): qty 1

## 2018-06-03 MED ORDER — ACETAMINOPHEN 325 MG PO TABS
650.0000 mg | ORAL_TABLET | ORAL | Status: DC | PRN
  Administered 2018-06-03: 11:00:00 650 mg via ORAL
  Filled 2018-06-03: qty 2

## 2018-06-03 MED ORDER — PANTOPRAZOLE SODIUM 40 MG PO TBEC
40.0000 mg | DELAYED_RELEASE_TABLET | Freq: Every day | ORAL | Status: DC
  Administered 2018-06-03 – 2018-06-05 (×3): 40 mg via ORAL
  Filled 2018-06-03 (×3): qty 1

## 2018-06-03 MED ORDER — ARIPIPRAZOLE 2 MG PO TABS
2.0000 mg | ORAL_TABLET | Freq: Every day | ORAL | Status: DC
  Administered 2018-06-03 – 2018-06-05 (×3): 2 mg via ORAL
  Filled 2018-06-03 (×3): qty 1

## 2018-06-03 MED ORDER — ATORVASTATIN CALCIUM 20 MG PO TABS
80.0000 mg | ORAL_TABLET | Freq: Every day | ORAL | Status: DC
  Administered 2018-06-03 – 2018-06-05 (×3): 80 mg via ORAL
  Filled 2018-06-03 (×3): qty 4

## 2018-06-03 MED ORDER — CARVEDILOL 25 MG PO TABS
25.0000 mg | ORAL_TABLET | Freq: Two times a day (BID) | ORAL | Status: DC
  Administered 2018-06-03 – 2018-06-05 (×5): 25 mg via ORAL
  Filled 2018-06-03 (×5): qty 1

## 2018-06-03 MED ORDER — ENOXAPARIN SODIUM 40 MG/0.4ML ~~LOC~~ SOLN
40.0000 mg | SUBCUTANEOUS | Status: DC
  Administered 2018-06-03 – 2018-06-04 (×2): 40 mg via SUBCUTANEOUS
  Filled 2018-06-03 (×2): qty 0.4

## 2018-06-03 MED ORDER — ACETAMINOPHEN 650 MG RE SUPP: 650.0000 mg | RECTAL | Status: DC | PRN

## 2018-06-03 MED ORDER — TRAZODONE HCL 50 MG PO TABS
50.0000 mg | ORAL_TABLET | Freq: Every day | ORAL | Status: DC
  Administered 2018-06-03 – 2018-06-04 (×3): 50 mg via ORAL
  Filled 2018-06-03 (×3): qty 1

## 2018-06-03 MED ORDER — HYDROXYZINE HCL 10 MG PO TABS
10.0000 mg | ORAL_TABLET | Freq: Three times a day (TID) | ORAL | Status: DC | PRN
  Filled 2018-06-03: qty 1

## 2018-06-03 MED ORDER — AMOXICILLIN-POT CLAVULANATE 875-125 MG PO TABS
1.0000 | ORAL_TABLET | Freq: Two times a day (BID) | ORAL | Status: DC
  Administered 2018-06-03 – 2018-06-05 (×5): 1 via ORAL
  Filled 2018-06-03 (×5): qty 1

## 2018-06-03 MED ORDER — INSULIN ASPART 100 UNIT/ML ~~LOC~~ SOLN
0.0000 [IU] | Freq: Three times a day (TID) | SUBCUTANEOUS | Status: DC
  Administered 2018-06-03 (×2): 1 [IU] via SUBCUTANEOUS
  Administered 2018-06-03 – 2018-06-04 (×3): 2 [IU] via SUBCUTANEOUS
  Administered 2018-06-05: 1 [IU] via SUBCUTANEOUS
  Filled 2018-06-03 (×6): qty 1

## 2018-06-03 MED ORDER — ACETAMINOPHEN 160 MG/5ML PO SOLN: 650.0000 mg | ORAL | Status: DC | PRN

## 2018-06-03 MED ORDER — STROKE: EARLY STAGES OF RECOVERY BOOK
Freq: Once | Status: AC
  Administered 2018-06-03: 02:00:00

## 2018-06-03 MED ORDER — BUTALBITAL-APAP-CAFFEINE 50-325-40 MG PO TABS: 1.0000 | ORAL_TABLET | ORAL | Status: DC | PRN

## 2018-06-03 NOTE — Progress Notes (Signed)
   06/03/18 0000  Clinical Encounter Type  Visited With Patient and family together  Visit Type Initial;Code  Referral From Care management  Consult/Referral To Chaplain;Nurse;Physician  Chaplain received page for code stroke. Chaplain arrived at patient's room and doctor was examining patient for stroke. After doctor finished, Chaplain introduced herself to patient and wife [name]). Chaplain explained how the Chaplain was a part of the care team and we are also here to make sure the patient is ok as well. The  Patient was glad that I came. Chaplain gave emotional support. Patient express to Chaplain that he just lost his best friend of over 30 years, this morning. Patient was really upset and so was his wife [name]. Chaplain continued to support the patient while nurse tend to him. Patient ask Chaplain where did she pastor at. Chaplain shared with patient and patient share his experience with church. Patient was really emotional, he and his wife [name]. Chaplain listened to patient and extended a heartfelt empathy for their experience. Chaplain continued to maintain her pastoral presence while nurse give patient medicine. Chaplain ended the visit with prayer with patient and wife, which they gladly accepted.

## 2018-06-03 NOTE — Progress Notes (Signed)
SLP Cancellation Note  Patient Details Name: Jon [name] MRN: [id number] DOB: Oct 05, 1950   Cancelled treatment:       Reason Eval/Treat Not Completed: SLP screened, no needs identified, will sign off(chart reviewed; consulted NSG then met w/ pt/family). Pt denied any difficulty swallowing and is currently on a regular diet; tolerates swallowing pills w/ water per NSG and Wife. Wife had brought in breakfast food which he consumed while in room w/out deficits noted. Pt conversed at conversational level w/out overt deficits noted this morning; pt and Wife denied any new speech-language deficits. Wife reported pt has a h/o TIA and cerebral AVM with prior hemorrhage which can "bother his speech and language a little at times"; she endorsed she notices it when he is tired or fatigued. Discussed this would be expected and to monitor for any new changes and update MD/Neurology. Wife and pt agreed.  No further skilled ST services indicated as pt appears at his baseline. Pt agreed. NSG to reconsult if any change in status while admitted.     Orinda Kenner, MS, CCC-SLP [place] [facility] 06/03/2018, 9:50 AM

## 2018-06-03 NOTE — Care Management Obs Status (Signed)
MEDICARE OBSERVATION STATUS NOTIFICATION   Patient Details  Name: Jon [name] MRN: [id number] Date of Birth: Apr 05, 1951   Medicare Observation Status Notification Given:  Yes    Elza Rafter, RN 06/03/2018, 3:34 PM

## 2018-06-03 NOTE — Evaluation (Signed)
Occupational Therapy Evaluation Patient Details Name: Jon [name] MRN: [id number] DOB: Jun 21, 1950 Today's Date: 06/03/2018    History of Present Illness 68 yo male with onset of L side weakness and confusion with chest pain was admitted for evaluation.  Had negative CT for stroke, MRI planned.  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  PMHx:  R rib fractures, DM, HTN, TIA, spinal fracture, head injury with concussion   Clinical Impression   Pt seen for OT evaluation this date. Prior to hospital admission, pt was independent with ADLs/IADLs including house work and repair projects as well as driving.  Pt lives in [facility] with spouse.  Currently pt demonstrates impairments in standing balance and tolerance as well as L UE weakness requiring MIN/MOD A with sit<>stand transfers and static standing balance with FWW as well as MOD A for LB ADLs. Pt with P+ static standing balance and only tolerates 15-20 seconds stating he is dizzy.  Pt would benefit from skilled OT to address noted impairments and functional limitations (see below for any additional details) in order to maximize safety and independence while minimizing falls risk and caregiver burden.  Upon hospital discharge, recommend pt discharge to SNF.    Follow Up Recommendations  SNF    Equipment Recommendations  None recommended by OT;Other (comment)(defer to next level of care)    Recommendations for Other Services       Precautions / Restrictions Precautions Precautions: Fall Precaution Comments: backward leaning Restrictions Weight Bearing Restrictions: No      Mobility Bed Mobility Overal bed mobility: Needs Assistance Bed Mobility: Supine to Sit;Sit to Supine     Supine to sit: Min guard(with use of bedrail and HOB elevated. ) Sit to supine: Min guard    Transfers Overall transfer level: Needs assistance Equipment used: Rolling walker (2 wheeled) Transfers: Sit to/from Stand Sit to Stand: Min assist;Mod assist         General  transfer comment: Pt uncomfortable with attempting to take steps on OT assessment d/t poor standing balance. States he feels it's gotten worse since last night.     Balance Overall balance assessment: Needs assistance;History of Falls Sitting-balance support: Feet supported;Single extremity supported Sitting balance-Leahy Scale: Fair     Standing balance support: Bilateral upper extremity supported Standing balance-Leahy Scale: Poor                           ADL either performed or assessed with clinical judgement   ADL Overall ADL's : Needs assistance/impaired Eating/Feeding: Independent   Grooming: Wash/dry face;Oral care;Set up;Sitting Grooming Details (indicate cue type and reason): unable to stand to perform grooming at this time d/t decreased standing balance and tolerance.  Upper Body Bathing: Minimal assistance;Sitting   Lower Body Bathing: Moderate assistance;Sit to/from stand Lower Body Bathing Details (indicate cue type and reason): based on clinical observation Upper Body Dressing : Minimal assistance;Sitting Upper Body Dressing Details (indicate cue type and reason): requires assistance to locate sleeve with L UE d/t c/o numbess/decreased sensation Lower Body Dressing: Moderate assistance;Sit to/from stand   Toilet Transfer: Minimal assistance;Moderate assistance;RW;BSC Toilet Transfer Details (indicate cue type and reason): Pt afraid to take steps to bathroom with RW to attempt commode transfer to standard commode at this time.  Toileting- Clothing Manipulation and Hygiene: Minimal assistance;Moderate assistance;Sit to/from stand   Tub/ Shower Transfer: Minimal assistance;Moderate assistance;Grab bars Tub/Shower Transfer Details (indicate cue type and reason): based on clinical observation Functional mobility during ADLs: (functional mobility  not assessed at this time as pt with P+ balance in static standing with FWW and MIN/MOD A)       Vision Baseline  Vision/History: Wears glasses Wears Glasses: At all times Patient Visual Report: No change from baseline Vision Assessment?: Yes Eye Alignment: Within Functional Limits Ocular Range of Motion: Within Functional Limits Alignment/Gaze Preference: Within Defined Limits Tracking/Visual Pursuits: Able to track stimulus in all quads without difficulty Saccades: Within functional limits Convergence: Within functional limits Visual Fields: No apparent deficits     Perception     Praxis      Pertinent Vitals/Pain Pain Assessment: No/denies pain     Hand Dominance Right   Extremity/Trunk Assessment Upper Extremity Assessment Upper Extremity Assessment: RUE deficits/detail;LUE deficits/detail RUE Deficits / Details: 5/5 shld/elbow flex/ext abd/add and grip LUE Deficits / Details: 4-/5 shld/elbow flex/ext abd/add and grip LUE Sensation: decreased proprioception;decreased light touch(difficulty with L side finger-to-nose coorindation test. Dysdiadokinesia noted with rapid pronation/supination-L side movements slower.) LUE Coordination: decreased fine motor;decreased gross motor   Lower Extremity Assessment Lower Extremity Assessment: Defer to PT evaluation LLE Deficits / Details: L hip and knee are 3+ strength, ankle 4+ LLE Coordination: decreased fine motor;decreased gross motor   Cervical / Trunk Assessment Cervical / Trunk Assessment: Normal;Other exceptions(hx spinal fx 20+ yrs ago)   Communication Communication Communication: No difficulties   Cognition Arousal/Alertness: Awake/alert Behavior During Therapy: WFL for tasks assessed/performed Overall Cognitive Status: Within Functional Limits for tasks assessed                                 General Comments: Pt slightly lethargic, eyes minimally opened throughout assessment, but also, his eyelids appear to be potentially swollen/puffy. Appropriate in responses.   General Comments  Pt reports feeling light  headed/dizzy in standing and is hesitant to attempt steps. Fxl mobiltiy ultimately not assessed at this time for pt safety.     Exercises Other Exercises Other Exercises: Pt and family members present (son and spouse) educated on OT role and POC. Verbalized understanding. Pt requires reinforcement.  Other Exercises: Pt and family members present educated on falls prevention strategies and importance to prevent injury. Verbalized understanding, needs reinforcement.    Shoulder Instructions      Home Living Family/patient expects to be discharged to:: Private residence Living Arrangements: Spouse/significant other Available Help at Discharge: Family;Available PRN/intermittently Type of Home: House Home Access: Stairs to enter [employer] of Steps: 6 steps + bilat rails from side entrance (primary); 5+1 steps for front entrance Entrance Stairs-Rails: Right;Left;Can reach both Home Layout: One level     Bathroom Shower/Tub: Tub/shower unit;Curtain   Biochemist, clinical: Standard     Home Equipment: Environmental consultant - 2 wheels;Cane - single point   Additional Comments: hx concussion which prompted temporary use of ADs      Prior Functioning/Environment Level of Independence: Independent        Comments: Pt was independent with all ADLs/IADLs including driving and housework        OT Problem List: Decreased strength;Decreased range of motion;Decreased activity tolerance;Impaired balance (sitting and/or standing);Decreased coordination;Decreased knowledge of use of DME or AE;Impaired sensation;Impaired UE functional use      OT Treatment/Interventions: Self-care/ADL training;Therapeutic exercise;Energy conservation;DME and/or AE instruction;Therapeutic activities;Patient/family education;Balance training    OT Goals(Current goals can be found in the care plan section) Acute Rehab OT Goals Patient Stated Goal: To get out of the hospital OT Goal Formulation: With  patient/family Time For Goal Achievement: 06/17/18 Potential to Achieve Goals: Good ADL Goals Pt Will Transfer to Toilet: bedside commode;with min guard assist Additional ADL Goal #1: Pt will tolerate 2-3 minute stand sink side with CGA with FWW to complete 1-2 hygiene/grooming tasks.  OT Frequency: Min 1X/week   Barriers to D/C:            Co-evaluation              AM-PAC OT "6 Clicks" Daily Activity     Outcome Measure Help from another person eating meals?: None Help from another person taking care of personal grooming?: A Little Help from another person toileting, which includes using toliet, bedpan, or urinal?: A Lot Help from another person bathing (including washing, rinsing, drying)?: A Lot Help from another person to put on and taking off regular upper body clothing?: A Little Help from another person to put on and taking off regular lower body clothing?: A Lot 6 Click Score: 16   End of Session Equipment Utilized During Treatment: Gait belt;Rolling walker Nurse Communication: Mobility status  Activity Tolerance: Patient tolerated treatment well Patient left: in bed;with call bell/phone within reach;with bed alarm set;with family/visitor present(with radiology presenting to take pt to MRI)  OT Visit Diagnosis: Unsteadiness on feet (R26.81);Muscle weakness (generalized) (M62.81)                Time: 5825-1898 OT Time Calculation (min): 30 min Charges:  OT General Charges $OT Visit: 1 Visit OT Evaluation $OT Eval Moderate Complexity: 1 Mod OT Treatments $Self Care/Home Management : 8-22 mins  Gerrianne Scale, MS, OTR/L ascom [phone] or [phone] 06/03/18, 12:45 PM

## 2018-06-03 NOTE — Progress Notes (Signed)
Patient ID: Jon [name], male   DOB: 05-19-1950, 68 y.o.   MRN: [id number]  [facility] PROGRESS NOTE  Jon [name] TMA:[id number] DOB: 01-06-1951 DOA: 06/02/2018 PCP: Anastasio Champion, MD  HPI/Subjective: Patient lost his best friend yesterday.  Had an episode where he is walking to get the dog and he passed out.  He does not walk with a cane or a walker.  He holds onto things.  Patient is frustrated about his medical condition.  Always has a headache.  Having some left-sided weakness.  Wife noticed it took a while for him to come through yesterday.  No seizure-like activity.  No loss of bowel or bladder function.  Objective: Vitals:   06/03/18 1018 06/03/18 1020  BP: 137/89 (!) 122/96  Pulse: 71 77  Resp:    Temp:    SpO2: 98% 99%    Filed Weights   06/02/18 2144  Weight: 80.7 kg    ROS: Review of Systems  Constitutional: Negative for chills and fever.  Eyes: Negative for blurred vision.  Respiratory: Negative for cough and shortness of breath.   Cardiovascular: Negative for chest pain.  Gastrointestinal: Negative for abdominal pain, constipation, diarrhea, nausea and vomiting.  Genitourinary: Negative for dysuria.  Musculoskeletal: Negative for joint pain.  Neurological: Positive for dizziness and focal weakness. Negative for headaches.   Exam: Physical Exam  Constitutional: He is oriented to person, place, and time.  HENT:  Nose: No mucosal edema.  Mouth/Throat: No oropharyngeal exudate or posterior oropharyngeal edema.  Right tympanic membrane bulging and erythematous  Eyes: Pupils are equal, round, and reactive to light. Conjunctivae, EOM and lids are normal.  Neck: No JVD present. Carotid bruit is not present. No edema present. No thyroid mass and no thyromegaly present.  Cardiovascular: S1 normal and S2 normal. Exam reveals no gallop.  No murmur heard. Pulses:      Dorsalis pedis pulses are 2+ on the right side and 2+ on the left side.   Respiratory: No respiratory distress. He has no wheezes. He has no rhonchi. He has no rales.  GI: Soft. Bowel sounds are normal. There is no abdominal tenderness.  Musculoskeletal:     Right shoulder: He exhibits no swelling.  Lymphadenopathy:    He has no cervical adenopathy.  Neurological: He is alert and oriented to person, place, and time. No cranial nerve deficit. He displays no Babinski's sign on the right side. He displays no Babinski's sign on the left side.  Left side 4+ out of 5 strength.  Right side 5 out of 5 strength  Skin: Skin is warm. No rash noted. Nails show no clubbing.  Psychiatric: He has a normal mood and affect.      Data Reviewed: Basic Metabolic Panel: Recent Labs  Lab 06/02/18 2143  NA 134*  K 3.8  CL 102  CO2 27  GLUCOSE 211*  BUN 17  CREATININE 1.09  CALCIUM 9.2   Liver Function Tests: Recent Labs  Lab 06/02/18 2143  AST 27  ALT 25  ALKPHOS 51  BILITOT 0.8  PROT 7.2  ALBUMIN 4.4   CBC: Recent Labs  Lab 06/02/18 2143  WBC 6.0  NEUTROABS 3.9  HGB 14.5  HCT 43.3  MCV 92.7  PLT 218   Cardiac Enzymes: Recent Labs  Lab 06/02/18 2143  TROPONINI <0.03    CBG: Recent Labs  Lab 06/02/18 2144 06/03/18 0749 06/03/18 1119  GLUCAP 226* 139* 130*    Studies: Mr Brain Wo Contrast  Result Date: 06/03/2018 CLINICAL DATA:  Confusion and chest pain. EXAM: MRI HEAD WITHOUT CONTRAST MRA HEAD WITHOUT CONTRAST TECHNIQUE: Multiplanar, multiecho pulse sequences of the brain and surrounding structures were obtained without intravenous contrast. Angiographic images of the head were obtained using MRA technique without contrast. COMPARISON:  Head CT from earlier today.  Brain MRI 07/31/2017 FINDINGS: MRI HEAD FINDINGS Brain: No acute infarction, acute hemorrhage, hydrocephalus, extra-axial collection or mass lesion. 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 There are scattered foci of remote microhemorrhage along the gray-white junction of the bilateral cerebrum. Mild chronic small vessel ischemic type gliosis in the cerebral white matter. AVM on the left, see below Vascular: See below Skull and upper cervical spine: Negative for marrow lesion Sinuses/Orbits: Bilateral cataract resection MRA HEAD FINDINGS Symmetric carotid and vertebral arteries. Wispy tangle arterial signal in the superficial upper left temporal lobe with venous shunting into a prominent left cortical vein. The nidus measures approximately 15 mm. There is no branch occlusion, flow limiting stenosis, or aneurysm at the circle-of-Willis. IMPRESSION: Brain MRI: 1. No acute finding. 2. 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 3. 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 The mass is stable from March 2019, new from February 2008. Intracranial MRA: 1. 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 2. No acute finding or flow limiting stenosis. Electronically Signed   By: Jon [name] M.D.   On: 06/03/2018 10:26   Mr Cervical Spine Wo Contrast  Result Date: 06/03/2018 CLINICAL DATA:  Subacute neuro deficit left-sided weakness for 4-5 days. EXAM: MRI CERVICAL SPINE WITHOUT CONTRAST TECHNIQUE: Multiplanar, multisequence MR imaging of the cervical spine was performed. No intravenous contrast was administered. COMPARISON:  None. FINDINGS: Alignment: Normal Vertebrae: No fracture, evidence of discitis, or bone lesion. Cord: Normal signal and morphology. Posterior Fossa, vertebral arteries, paraspinal tissues: Negative. Disc levels: C2-3: Unremarkable. C3-4: Minimal ridging and facet spurring.  No impingement C4-5: Shallow  left paracentral protrusion.  No impingement C5-6: Disc narrowing with asymmetric leftward uncovertebral ridging. Left foraminal impingement. Patent spinal canal C6-7: Disc narrowing with left foraminal protrusion and uncovertebral ridging. Left foraminal impingement. Patent canal and right foramen C7-T1:Unremarkable. IMPRESSION: 1. No acute finding or cord signal abnormality. 2. Degenerative left foraminal impingement at C5-6 and C6-7. Electronically Signed   By: Jon [name] M.D.   On: 06/03/2018 12:46   US Carotid Bilateral (at [facility] And [facility] Only)  Result Date: 06/03/2018 CLINICAL DATA:  68 year old male with a history left-sided weakness EXAM: BILATERAL CAROTID DUPLEX ULTRASOUND TECHNIQUE: Pearline Cables scale imaging, color Doppler and duplex ultrasound were performed of bilateral carotid and vertebral arteries in the neck. COMPARISON:  No prior duplex FINDINGS: Criteria: Quantification of carotid stenosis is based on velocity parameters that correlate the residual internal carotid diameter with NASCET-based stenosis levels, using the diameter of the distal internal carotid lumen as the denominator for stenosis measurement. The following velocity measurements were obtained: RIGHT ICA:  Systolic 56 cm/sec, Diastolic 17 cm/sec CCA:  76 cm/sec SYSTOLIC ICA/CCA RATIO:  0.7 ECA:  99 cm/sec LEFT ICA:  Systolic 69 cm/sec, Diastolic 30 cm/sec CCA:  79 cm/sec SYSTOLIC ICA/CCA RATIO:  0.8 ECA:  90 cm/sec Right Brachial SBP: Not acquired  Left Brachial SBP: Not acquired RIGHT CAROTID ARTERY: No significant calcifications of the right common carotid artery. Intermediate waveform maintained. Heterogeneous and partially calcified plaque at the right carotid bifurcation. No significant lumen shadowing. Low resistance waveform of the right ICA. No significant tortuosity. RIGHT VERTEBRAL ARTERY: Antegrade flow with low resistance waveform. LEFT CAROTID ARTERY: No significant calcifications of the left common carotid artery.  Intermediate waveform maintained. Heterogeneous and partially calcified plaque at the left carotid bifurcation without significant lumen shadowing. Low resistance waveform of the left ICA. No significant tortuosity. LEFT VERTEBRAL ARTERY:  Antegrade flow with low resistance waveform. IMPRESSION: Color duplex indicates minimal heterogeneous and calcified plaque, with no hemodynamically significant stenosis by duplex criteria in the extracranial cerebrovascular circulation. Signed, Dulcy Fanny. Dellia Nims, RPVI [facility] Electronically Signed   By: Corrie Mckusick D.O.   On: 06/03/2018 09:47   Dg Chest Portable 1 View  Result Date: 06/02/2018 CLINICAL DATA:  Chest pain, syncope EXAM: PORTABLE CHEST 1 VIEW COMPARISON:  08/02/2017 FINDINGS: No acute opacity or pleural effusion. Stable cardiomediastinal silhouette. No pneumothorax. Old right-sided rib fractures. IMPRESSION: No active disease. Electronically Signed   By: Donavan Foil M.D.   On: 06/02/2018 22:23   Mr Jodene Nam Head/brain [place] Cm  Result Date: 06/03/2018 CLINICAL DATA:  Confusion and chest pain. EXAM: MRI HEAD WITHOUT CONTRAST MRA HEAD WITHOUT CONTRAST TECHNIQUE: Multiplanar, multiecho pulse sequences of the brain and surrounding structures were obtained without intravenous contrast. Angiographic images of the head were obtained using MRA technique without contrast. COMPARISON:  Head CT from earlier today.  Brain MRI 07/31/2017 FINDINGS: MRI HEAD FINDINGS Brain: No acute infarction, acute hemorrhage, hydrocephalus, extra-axial collection or mass lesion. 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 There are scattered foci of remote microhemorrhage along the gray-white junction of the  bilateral cerebrum. Mild chronic small vessel ischemic type gliosis in the cerebral white matter. AVM on the left, see below Vascular: See below Skull and upper cervical spine: Negative for marrow lesion Sinuses/Orbits: Bilateral cataract resection MRA HEAD FINDINGS Symmetric carotid and vertebral arteries. Wispy tangle arterial signal in the superficial upper left temporal lobe with venous shunting into a prominent left cortical vein. The nidus measures approximately 15 mm. There is no branch occlusion, flow limiting stenosis, or aneurysm at the circle-of-Willis. IMPRESSION: Brain MRI: 1. No acute finding. 2. 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 3. 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 The mass is stable from March 2019, new from February 2008. Intracranial MRA: 1. 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 2. No acute finding or flow limiting stenosis. Electronically Signed   By: Jon [name] M.D.   On: 06/03/2018 10:26   Ct Head Code Stroke Wo Contrast`  Result Date: 06/02/2018 CLINICAL DATA:  Code stroke. 68 y/o M; decreased level of consciousness and headache. History of vascular malformation. EXAM: CT HEAD WITHOUT CONTRAST TECHNIQUE: Contiguous axial images were obtained from the base of the skull through the vertex without intravenous contrast. COMPARISON:  08/07/2017 CT head. FINDINGS: Brain: No evidence of acute infarction, hemorrhage, hydrocephalus, extra-axial collection or mass lesion/mass effect. 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 Stable nonspecific  white matter hypodensities compatible chronic microvascular ischemic changes and volume loss of the brain. Vascular: Calcific atherosclerosis of carotid siphons. No hyperdense vessel identified. Prominent cortical vein overlying the left lateral  temporal and frontal lobe extending to the anterior aspect of the superior sagittal sinus draining the vascular malformation of the right lateral temporal lobe (series 2, image 14). No mass or hemorrhage effect associated with the vascular malformation. Skull: Normal. Negative for fracture or focal lesion. Sinuses/Orbits: No acute finding. Other: None. ASPECTS [facility] Stroke Program Early CT Score) - Ganglionic level infarction (caudate, lentiform nuclei, internal capsule, insula, M1-M3 cortex): 7 - Supraganglionic infarction (M4-M6 cortex): 3 Total score (0-10 with 10 being normal): 10 IMPRESSION: 1. No acute intracranial abnormality identified. 2. ASPECTS is 10 3. Stable chronic microvascular ischemic changes and volume loss of the brain. 4. 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 No mass effect or hemorrhage associated with the malformation. These results were called by telephone at the time of interpretation on 06/02/2018 at 10:28 pm to Dr. Carrie Mew , who verbally acknowledged these results. Electronically Signed   By: Kristine Garbe M.D.   On: 06/02/2018 22:33    Scheduled Meds: . amoxicillin-clavulanate  1 tablet Oral Q12H  . ARIPiprazole  2 mg Oral Daily  . atorvastatin  80 mg Oral Daily  . carvedilol  25 mg Oral BID WC  . DULoxetine  60 mg Oral Daily  . enoxaparin (LOVENOX) injection  40 mg Subcutaneous Q24H  . insulin aspart  0-9 Units Subcutaneous TID WC  . pantoprazole  40 mg Oral Daily  . traZODone  50 mg Oral QHS   Continuous Infusions:  Assessment/Plan:  1. Left-sided weakness and syncope.  Case discussed with neurology and they do not believe that this is a stroke.  Patient was very weak with physical therapy and not able to do very much. 2. Chronic headaches.  Patient states Fioricet helped.  PRN medication ordered.  Patient drinks a lot of Dr. Malachi Bonds and some coffee in the morning. 3. Right-sided otitis media.  Start  Augmentin 4. Depression.  Patient states that he is ready to give up on coming to the hospital.  I offered to have psychiatry see him.  He declined.  He states he will follow-up with his psychiatrist as outpatient.  Recently started on Abilify. 5. Hyperlipidemia unspecified on atorvastatin 6. Hypertension on Coreg 7. GERD on PPI  Code Status:     Code Status Orders  (From admission, onward)         Start     Ordered   06/03/18 0129  Full code  Continuous     06/03/18 0128        Code Status History    Date Active Date Inactive Code Status Order ID Comments User Context   07/30/2017 1703 08/01/2017 1937 Full Code [id number]  Epifanio Lesches, MD ED     Family Communication: Wife at the bedside Disposition Plan: To be determined  Consultants:  Neurology  Antibiotics:  Augmentin  Time spent: 38 minutes  [place]

## 2018-06-03 NOTE — Consult Note (Signed)
Neurology Consultation Reason for Consult: Left-sided weakness Referring Physician: Jannifer Franklin, D  CC: Left-sided weakness  History is obtained from: Patient  HPI: Jon [name] is a 68 y.o. male with a history of diabetes, hypertension who presents with left-sided weakness that he states is been going on for at least a week, possibly longer gradually getting worse as well as an episode of syncope last night.  He describes standing up and walking towards the kitchen at which point he realized that he was "going down" and then fell.  His wife describes that he fell initially on his bottom and then to the ground, she does not feel that this was a controlled sit down, but rather a fall.  He was unconscious for several minutes, and then was further confused for several minutes after that.  Of note, he has a history of head injury with loss of consciousness several times in the past, he has some superficial siderosis which was seen at least in 2018 on previous MRIs.  He also has a left temporal AVM.   He was admitted with concern for stroke, however MRI does not currently show acute stroke.  ROS: A 14 point ROS was performed and is negative except as noted in the HPI.   Past Medical History:  Diagnosis Date  . Diabetes mellitus without complication ([place])   . Hypertension   . TIA (transient ischemic attack)      History reviewed. No pertinent family history.   Social History:  reports that he has never smoked. He has never used smokeless tobacco. He reports that he does not drink alcohol or use drugs.   Exam: Current vital signs: BP (!) 122/96 (BP Location: Right Arm)   Pulse 77   Temp 98.7 F (37.1 C) (Oral)   Resp 20   Ht 5\' 6"  (1.676 m)   Wt 80.7 kg   SpO2 99%   BMI 28.73 kg/m  Vital signs in last 24 hours: Temp:  [97.9 F (36.6 C)-98.7 F (37.1 C)] 98.7 F (37.1 C) (01/25 0916) Pulse Rate:  [62-86] 77 (01/25 1020) Resp:  [10-20] 20 (01/25 0916) BP: (101-157)/(65-103)  122/96 (01/25 1020) SpO2:  [95 %-100 %] 99 % (01/25 1020) Weight:  [80.7 kg] 80.7 kg (01/24 2144)   Physical Exam  Constitutional: Appears well-developed and well-nourished.  Psych: Affect appropriate to situation Eyes: No scleral injection HENT: No OP obstrucion Head: Normocephalic.  Cardiovascular: Normal rate and regular rhythm.  Respiratory: Effort normal, non-labored breathing GI: Soft.  No distension. There is no tenderness.  Skin: WDI  Neuro: Mental Status: Patient is awake, alert, oriented to person, place, month, year, and situation. Patient is able to give a clear and coherent history. No signs of aphasia or neglect Cranial Nerves: II: Visual Fields are full. Pupils are equal, round, and reactive to light.   III,IV, VI: EOMI without ptosis or diploplia.  V: Facial sensation is symmetric to temperature VII: Facial movement is symmetric.  VIII: hearing is intact to voice X: Uvula elevates symmetrically XI: Shoulder shrug is symmetric. XII: tongue is midline without atrophy or fasciculations.  Motor: Tone is normal. Bulk is normal.  He has mild 4/5 strength of the left arm and leg Sensory: Sensation is symmetric to light touch and temperature in the arms and legs. Cerebellar: FNF is slow but without ataxia bilaterally   I have reviewed labs in epic and the results pertinent to this consultation are: A1c 7.1 CMP-relatively unremarkable  I have reviewed the images  obtained: MRI brain-no acute stroke  Impression: 68 year old male with left-sided weakness that seems to have been slightly worsening over the past few weeks as well as syncope.  The description of the syncope is unusual, given the duration of time that he was confused.  Certainly he has had some evidence of previous injury given the superficial siderosis that is seen, and it could be that this is simply recrudescence of previous injury in the setting of some unidentified physiological stressor.  Given his  history, an EEG would be helpful, but unfortunately this is cannot be done over the weekend.  One other consideration would be that his weakness is not cerebrally mediated and therefore I would favor getting an MRI of his cervical spine to rule out cervical disease.  With negative MRI brain and persistent symptoms, I do not think TIA or stroke continues to need to be considered.  Recommendations: 1) MRI cervical spine 2) consider EEG 3) physical therapy, Occupational Therapy 4) neurology will continue to follow  Roland Rack, MD [facility] [phone]  If 7pm- 7am, please page neurology on call as listed in [place].

## 2018-06-03 NOTE — Plan of Care (Signed)
  Problem: Education: Goal: Knowledge of disease or condition will improve Outcome: Progressing Goal: Knowledge of secondary prevention will improve Outcome: Progressing Goal: Knowledge of patient specific risk factors addressed and post discharge goals established will improve Outcome: Progressing   Problem: Coping: Goal: Will verbalize positive feelings about self Outcome: Progressing Goal: Will identify appropriate support needs Outcome: Progressing   Problem: Health Behavior/Discharge Planning: Goal: Ability to manage health-related needs will improve Outcome: Progressing   Problem: Self-Care: Goal: Ability to participate in self-care as condition permits will improve Outcome: Progressing Goal: Verbalization of feelings and concerns over difficulty with self-care will improve Outcome: Progressing Goal: Ability to communicate needs accurately will improve Outcome: Progressing   

## 2018-06-03 NOTE — Evaluation (Signed)
Physical Therapy Evaluation Patient Details Name: Jon [name] MRN: [id number] DOB: 1950/08/30 Today's Date: 06/03/2018   History of Present Illness  68 yo male with onset of L side weakness and confusion with chest pain was admitted for evaluation.  Had negative CT for stroke, MRI planned.  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  PMHx:  R rib fractures, DM, HTN, TIA, spinal fracture, head injury with concussion  Clinical Impression  Pt is moving along with standing and steps side of bed but is listing backward at times sharply, and checked vitals to look for sources.  Nurse in to see pt with PT, and sitting values were BP 137/89, pulse 73 and O2 sat 98%;  Standing was BP 122/96, pulse 75, sat 99%.  He is not able to get relief of light headed feeling with more standing time and did not get an explanation for the feeling from vitals.  Will monitor him with all mobility and will also recommend SNF to progress gait and balance in a safer environment.     Follow Up Recommendations SNF    Equipment Recommendations  None recommended by PT    Recommendations for Other Services       Precautions / Restrictions Precautions Precautions: Fall Precaution Comments: backward leaning Restrictions Weight Bearing Restrictions: No      Mobility  Bed Mobility Overal bed mobility: Needs Assistance Bed Mobility: Supine to Sit;Sit to Supine     Supine to sit: Min guard Sit to supine: Min guard   General bed mobility comments: requires supervised help due to impulsivity  Transfers Overall transfer level: Needs assistance Equipment used: Rolling walker (2 wheeled);1 person hand held assist Transfers: Sit to/from Stand Sit to Stand: Mod assist         General transfer comment: dense cues and mod assist to power up and cues to gain control of standing balance at walker, reminders not to immediately take a walk  Ambulation/Gait Ambulation/Gait assistance: Min assist Gait Distance (Feet): 10  Feet Assistive device: Rolling walker (2 wheeled);1 person hand held assist Gait Pattern/deviations: Step-to pattern;Decreased stride length;Wide base of support Gait velocity: reduced Gait velocity interpretation: <1.8 ft/sec, indicate of risk for recurrent falls General Gait Details: sidesteps on side of bed as pt has sudden backward shifts of posture  Stairs            Wheelchair Mobility    Modified Rankin (Stroke Patients Only)       Balance Overall balance assessment: Needs assistance;History of Falls Sitting-balance support: Feet supported;Single extremity supported Sitting balance-Leahy Scale: Good     Standing balance support: Bilateral upper extremity supported;During functional activity Standing balance-Leahy Scale: Poor Standing balance comment: reminded pt not to walk away from bed and despite unsteadiness he did not seem to see the point in maintaining a secure location                             Pertinent Vitals/Pain Pain Assessment: No/denies pain    Home Living Family/patient expects to be discharged to:: Private residence Living Arrangements: Spouse/significant other Available Help at Discharge: Family;Available PRN/intermittently Type of Home: House Home Access: Stairs to enter Entrance Stairs-Rails: Right;Left;Can reach both Entrance Stairs-Number of Steps: 6 steps + bilat rails from side entrance (primary); 5+1 steps for front entrance Home Layout: One level Home Equipment: Walker - 2 wheels;Cane - single point Additional Comments: had concussion which required AD's    Prior Function Level of Independence:  Independent         Comments: has been physically able to do strenuous activities at home, such as repairs, and home alone in the daytime     Hand Dominance   Dominant Hand: Right    Extremity/Trunk Assessment   Upper Extremity Assessment Upper Extremity Assessment: Overall WFL for tasks assessed    Lower Extremity  Assessment Lower Extremity Assessment: LLE deficits/detail LLE Deficits / Details: L hip and knee are 3+ strength, ankle 4+ LLE Coordination: decreased fine motor;decreased gross motor    Cervical / Trunk Assessment Cervical / Trunk Assessment: Normal;Other exceptions(old spinal fracture 20 years ago)  Communication   Communication: No difficulties  Cognition Arousal/Alertness: Awake/alert Behavior During Therapy: Impulsive Overall Cognitive Status: No family/caregiver present to determine baseline cognitive functioning                                 General Comments: wife had left, not sure if he is normally fairly impulsive      General Comments General comments (skin integrity, edema, etc.): Pt felt light headed in standing with no findings of BP, pulse or O2 sats    Exercises     Assessment/Plan    PT Assessment Patient needs continued PT services  PT Problem List Decreased strength;Decreased range of motion;Decreased activity tolerance;Decreased balance;Decreased mobility;Decreased coordination;Decreased cognition;Decreased knowledge of use of DME;Decreased safety awareness;Cardiopulmonary status limiting activity       PT Treatment Interventions DME instruction;Gait training;Stair training;Functional mobility training;Therapeutic activities;Therapeutic exercise;Balance training;Neuromuscular re-education;Patient/family education    PT Goals (Current goals can be found in the Care Plan section)  Acute Rehab PT Goals Patient Stated Goal: to get home asap PT Goal Formulation: With patient Time For Goal Achievement: 06/17/18 Potential to Achieve Goals: Good    Frequency Min 2X/week   Barriers to discharge Decreased caregiver support;Inaccessible home environment stairs and limited daytime help    Co-evaluation               AM-PAC PT "6 Clicks" Mobility  Outcome Measure Help needed turning from your back to your side while in a flat bed without  using bedrails?: A Little Help needed moving from lying on your back to sitting on the side of a flat bed without using bedrails?: A Little Help needed moving to and from a bed to a chair (including a wheelchair)?: A Little Help needed standing up from a chair using your arms (e.g., wheelchair or bedside chair)?: A Lot Help needed to walk in hospital room?: A Little Help needed climbing 3-5 steps with a railing? : Total 6 Click Score: 15    End of Session Equipment Utilized During Treatment: Gait belt Activity Tolerance: Treatment limited secondary to medical complications (Comment) Patient left: in bed;with call bell/phone within reach;with bed alarm set Nurse Communication: Mobility status PT Visit Diagnosis: Unsteadiness on feet (R26.81);Muscle weakness (generalized) (M62.81);Difficulty in walking, not elsewhere classified (R26.2)    Time: 1002-1030 PT Time Calculation (min) (ACUTE ONLY): 28 min   Charges:   PT Evaluation $PT Eval Moderate Complexity: 1 Mod PT Treatments $Gait Training: 8-22 mins       Ramond Dial 06/03/2018, 11:58 AM  Mee Hives, PT MS Acute Rehab Dept. Number: [place] [facility] [place]

## 2018-06-03 NOTE — Progress Notes (Signed)
*  PRELIMINARY RESULTS* Echocardiogram 2D Echocardiogram has been performed.  Jon [name] 06/03/2018, 3:18 PM

## 2018-06-03 NOTE — H&P (Signed)
[facility] [place] NAME: Jon [name]    MR#:  [id number]  DATE OF BIRTH:  02-Apr-1951  DATE OF ADMISSION:  06/02/2018  PRIMARY CARE PHYSICIAN: Cooner, Deanne Coffer, MD   REQUESTING/REFERRING PHYSICIAN: Joni Fears, MD  CHIEF COMPLAINT:   Chief Complaint  Patient presents with  . Chest Pain  . Altered Mental Status    HISTORY OF PRESENT ILLNESS:  Jon [name]  is a 68 y.o. male who presents with chief complaint as above.  Patient presents to the ED with an episode of confusion and chest pain.  He was just told today that his close friend died and had sort of an acute emotional reaction after that.  That all subsided, but he then stated that he has been having left-sided weakness for the past 4 to 5 days.  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  Work-up here in the ED showed CT head negative for any acute bleed, and was otherwise initially within normal limits except for his persistent left-sided weakness on exam with some left-sided sensory deficit as well.  Hospitalist were called for admission and further evaluation  PAST MEDICAL HISTORY:   Past Medical History:  Diagnosis Date  . Diabetes mellitus without complication ([place])   . Hypertension   . TIA (transient ischemic attack)      PAST SURGICAL HISTORY:   Past Surgical History:  Procedure Laterality Date  . COLONOSCOPY    . EYE SURGERY    . HERNIA REPAIR       SOCIAL HISTORY:   Social History   Tobacco Use  . Smoking status: Never Smoker  . Smokeless tobacco: Never Used  Substance Use Topics  . Alcohol use: Never    Frequency: Never     FAMILY HISTORY:  Family history reviewed and is non-contributory   DRUG ALLERGIES:   Allergies  Allergen Reactions  . Beta Adrenergic Blockers Itching  . Topiramate Nausea And Vomiting    Other reaction(s): Dizziness, Hallucination Other Reaction:  violence  . Aspirin Other (See Comments)    Per pt's PCP due to spontaneous bleeding in his brain.  . Benadryl [Diphenhydramine] Hives  . Lisinopril Itching    Other reaction(s): Other (See Comments) Leg pain  . Nabumetone Other (See Comments)    Reaction: unknown  . Valproic Acid Other (See Comments)    Reaction: Headache    MEDICATIONS AT HOME:   Prior to Admission medications   Medication Sig Start Date End Date Taking? Authorizing Provider  amitriptyline (ELAVIL) 10 MG tablet Take 1 tablet (10 mg total) by mouth at bedtime. 08/01/17   Hillary Bow, MD  aspirin EC 81 MG tablet Take 81 mg by mouth daily.    [provider]  atorvastatin (LIPITOR) 80 MG tablet Take 80 mg by mouth daily. 07/01/17   [provider]  butalbital-acetaminophen-caffeine Emelda Brothers, ESGIC) 32-951-88 MG tablet Take 1-2 tablets by mouth every 6 (six) hours as needed for headache. 08/02/17 08/02/18  Alfred Levins, [place], MD  Cholecalciferol (D3-1000) 1000 units tablet Take 1,000 Units by mouth daily.    [provider]  gabapentin (NEURONTIN) 100 MG capsule Take 200 mg by mouth 3 (three) times daily.    [provider]  glimepiride (AMARYL) 1 MG tablet Take 1 mg by mouth daily. 06/20/17   [provider]  hydrOXYzine (ATARAX/VISTARIL) 10 MG tablet Take 1 tablet by mouth daily. 07/01/17  [provider]  losartan (COZAAR) 100 MG tablet Take 100 mg by mouth daily. 06/14/17   [provider]  Melatonin 3 MG TABS Take 6 mg by mouth at bedtime.    [provider]  metoprolol succinate (TOPROL-XL) 50 MG 24 hr tablet Take 75 mg by mouth daily.  07/28/17   [provider]  ondansetron (ZOFRAN ODT) 4 MG disintegrating tablet Take 1 tablet (4 mg total) by mouth every 8 (eight) hours as needed for nausea or vomiting. 08/02/17   Alfred Levins, [place], MD  pantoprazole (PROTONIX) 40 MG tablet Take 40 mg by mouth daily. 06/27/17   [provider]   testosterone cypionate (DEPOTESTOSTERONE CYPIONATE) 200 MG/ML injection Inject 200 mg into the muscle every 14 (fourteen) days. 07/29/17   [provider]  traZODone (DESYREL) 50 MG tablet Take 25 mg by mouth at bedtime.    [provider]  venlafaxine XR (EFFEXOR-XR) 37.5 MG 24 hr capsule Take 1 capsule (37.5 mg total) by mouth daily. 08/01/17   Hillary Bow, MD  vitamin B-12 (CYANOCOBALAMIN) 1000 MCG tablet Take 1,000 mcg by mouth daily.    [provider]    REVIEW OF SYSTEMS:  Review of Systems  Constitutional: Negative for chills, fever, malaise/fatigue and weight loss.  HENT: Negative for ear pain, hearing loss and tinnitus.   Eyes: Negative for blurred vision, double vision, pain and redness.  Respiratory: Negative for cough, hemoptysis and shortness of breath.   Cardiovascular: Negative for chest pain, palpitations, orthopnea and leg swelling.  Gastrointestinal: Negative for abdominal pain, constipation, diarrhea, nausea and vomiting.  Genitourinary: Negative for dysuria, frequency and hematuria.  Musculoskeletal: Negative for back pain, joint pain and neck pain.  Skin:       No acne, rash, or lesions  Neurological: Positive for sensory change and focal weakness. Negative for dizziness, tremors and weakness.  Endo/Heme/Allergies: Negative for polydipsia. Does not bruise/bleed easily.  Psychiatric/Behavioral: Negative for depression. The patient is not nervous/anxious and does not have insomnia.      VITAL SIGNS:   Vitals:   06/02/18 2245 06/02/18 2300 06/02/18 2315 06/02/18 2330  BP: 134/86 (!) 147/84 122/82 137/89  Pulse: 81 84  82  Resp: 18 12 16 15   Temp:      TempSrc:      SpO2: 95% 96%  99%  Weight:      Height:       Wt Readings from Last 3 Encounters:  06/02/18 80.7 kg  08/07/17 81.2 kg  08/02/17 81.2 kg    PHYSICAL EXAMINATION:  Physical Exam  Vitals reviewed. Constitutional: He is oriented to person, place, and time. He  appears well-developed and well-nourished. No distress.  HENT:  Head: Normocephalic and atraumatic.  Mouth/Throat: Oropharynx is clear and moist.  Eyes: Pupils are equal, round, and reactive to light. Conjunctivae and EOM are normal. No scleral icterus.  Neck: Normal range of motion. Neck supple. No JVD present. No thyromegaly present.  Cardiovascular: Normal rate, regular rhythm and intact distal pulses. Exam reveals no gallop and no friction rub.  No murmur heard. Respiratory: Effort normal and breath sounds normal. No respiratory distress. He has no wheezes. He has no rales.  GI: Soft. Bowel sounds are normal. He exhibits no distension. There is no abdominal tenderness.  Musculoskeletal: Normal range of motion.        General: No edema.     Comments: No arthritis, no gout  Lymphadenopathy:    He has no cervical adenopathy.  Neurological:  He is alert and oriented to person, place, and time. No cranial nerve deficit.  Neurologic: Cranial nerves II-XII intact, Sensation intact to light touch/pinprick on the right and decreased on the left upper and lower extremities, 5/5 strength in right upper and lower extremities with 4/5 strength in left upper and lower extremities, no dysarthria, no aphasia, no dysphagia, memory intact, upper extremity pronator drift is present  Skin: Skin is warm and dry. No rash noted. No erythema.  Psychiatric: He has a normal mood and affect. His behavior is normal. Judgment and thought content normal.    LABORATORY PANEL:   CBC Recent Labs  Lab 06/02/18 2143  WBC 6.0  HGB 14.5  HCT 43.3  PLT 218   ------------------------------------------------------------------------------------------------------------------  Chemistries  Recent Labs  Lab 06/02/18 2143  NA 134*  K 3.8  CL 102  CO2 27  GLUCOSE 211*  BUN 17  CREATININE 1.09  CALCIUM 9.2  AST 27  ALT 25  ALKPHOS 51  BILITOT 0.8    ------------------------------------------------------------------------------------------------------------------  Cardiac Enzymes Recent Labs  Lab 06/02/18 2143  TROPONINI <0.03   ------------------------------------------------------------------------------------------------------------------  RADIOLOGY:  Dg Chest Portable 1 View  Result Date: 06/02/2018 CLINICAL DATA:  Chest pain, syncope EXAM: PORTABLE CHEST 1 VIEW COMPARISON:  08/02/2017 FINDINGS: No acute opacity or pleural effusion. Stable cardiomediastinal silhouette. No pneumothorax. Old right-sided rib fractures. IMPRESSION: No active disease. Electronically Signed   By: Donavan Foil M.D.   On: 06/02/2018 22:23   Ct Head Code Stroke Wo Contrast`  Result Date: 06/02/2018 CLINICAL DATA:  Code stroke. 68 y/o M; decreased level of consciousness and headache. History of vascular malformation. EXAM: CT HEAD WITHOUT CONTRAST TECHNIQUE: Contiguous axial images were obtained from the base of the skull through the vertex without intravenous contrast. COMPARISON:  08/07/2017 CT head. FINDINGS: Brain: No evidence of acute infarction, hemorrhage, hydrocephalus, extra-axial collection or mass lesion/mass effect. 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 Stable nonspecific white matter hypodensities compatible chronic microvascular ischemic changes and volume loss of the brain. Vascular: Calcific atherosclerosis of carotid siphons. No hyperdense vessel identified. Prominent cortical vein overlying the left lateral temporal and frontal lobe extending to the anterior aspect of the superior sagittal sinus draining the vascular malformation of the right lateral temporal lobe (series 2, image 14). No mass or hemorrhage effect associated with the vascular malformation. Skull: Normal. Negative for fracture or focal lesion. Sinuses/Orbits: No acute finding. Other: None. ASPECTS [facility] Stroke Program Early CT Score) - Ganglionic  level infarction (caudate, lentiform nuclei, internal capsule, insula, M1-M3 cortex): 7 - Supraganglionic infarction (M4-M6 cortex): 3 Total score (0-10 with 10 being normal): 10 IMPRESSION: 1. No acute intracranial abnormality identified. 2. ASPECTS is 10 3. Stable chronic microvascular ischemic changes and volume loss of the brain. 4. 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 No mass effect or hemorrhage associated with the malformation. These results were called by telephone at the time of interpretation on 06/02/2018 at 10:28 pm to Dr. Carrie Mew , who verbally acknowledged these results. Electronically Signed   By: Kristine Garbe M.D.   On: 06/02/2018 22:33    EKG:   Orders placed or performed during the hospital encounter of 06/02/18  . EKG 12-Lead  . EKG 12-Lead    IMPRESSION AND PLAN:  Principal Problem:   Left-sided weakness -admit per stroke admission order set with appropriate imaging, consults, labs Active Problems:   HTN (hypertension) -and is well beyond 24-hour window from onset  of symptoms, we will control his blood pressure with a goal less than 160/100, continue home meds   Diabetes (HCC) -sliding scale insulin coverage   GERD (gastroesophageal reflux disease) -continue home dose PPI  Chart review performed and case discussed with ED provider. Labs, imaging and/or ECG reviewed by provider and discussed with patient/family. Management plans discussed with the patient and/or family.  DVT PROPHYLAXIS: SubQ lovenox   GI PROPHYLAXIS:  PPI   ADMISSION STATUS: Observation  CODE STATUS: Full Code Status History    Date Active Date Inactive Code Status Order ID Comments User Context   07/30/2017 1703 08/01/2017 1937 Full Code [id number]  Epifanio Lesches, MD ED      TOTAL TIME TAKING CARE OF THIS PATIENT: 40 minutes.   Ethlyn Daniels 06/03/2018, 12:05 AM  [facility]  Office   [phone]  CC: Primary care physician; Anastasio Champion, MD  Note:  This document was prepared using Dragon voice recognition software and may include unintentional dictation errors.

## 2018-06-04 DIAGNOSIS — R531 Weakness: Secondary | ICD-10-CM | POA: Diagnosis not present

## 2018-06-04 LAB — ECHOCARDIOGRAM COMPLETE
Height: 66 in
Weight: 2848 oz

## 2018-06-04 LAB — GLUCOSE, CAPILLARY
Glucose-Capillary: 155 mg/dL — ABNORMAL HIGH (ref 70–99)
Glucose-Capillary: 108 mg/dL — ABNORMAL HIGH (ref 70–99)
Glucose-Capillary: 171 mg/dL — ABNORMAL HIGH (ref 70–99)
Glucose-Capillary: 101 mg/dL — ABNORMAL HIGH (ref 70–99)

## 2018-06-04 NOTE — Progress Notes (Signed)
Subjective: Left-sided weakness is much better, still complains of a mild left-sided headache.  This is pretty much present chronically.  Exam: Vitals:   06/04/18 0500 06/04/18 0831  BP: 133/82 125/81  Pulse: 70 72  Resp: 16 18  Temp:  97.7 F (36.5 C)  SpO2: 96% 95%   Gen: In bed, NAD Resp: non-labored breathing, no acute distress Abd: soft, nt  Neuro: MS: Awake, alert, interactive and appropriate CN: Pupils equal reactive to light, visual fields full, EOMI Motor: He has good strength present bilaterally able to give 5/5 strength bilateral arms and legs Sensory: Mildly decreased in the left leg to light touch  Pertinent Labs: Glucose 155  MRI cervical spine-no evidence for cause for left-sided weakness  Impression: 68 year old male with transient left-sided weakness following a syncopal episode.  The weakness persisted for at least 24 hours and therefore I do not feel that TIA or stroke are at all likely.  The presence of headache during this does raise the possibility of complicated migraine and he does have a history of migraines, though no history of aura.  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  Recommendations: 1) EEG, though this could be performed as an outpatient 2) if EEG is negative, then I would not further pursue work-up at this time unless he were to have further episodes.  Roland Rack, MD [facility] [phone]  If 7pm- 7am, please page neurology on call as listed in [place].

## 2018-06-04 NOTE — Progress Notes (Signed)
Patient ID: Jon [name], male   DOB: [birth year], 68 y.o.   MRN: [id number]  [facility] PROGRESS NOTE  Jon [name]:[id number] DOB: 1950/05/25 DOA: 06/02/2018 PCP: Anastasio Champion, MD  HPI/Subjective: Patient feeling stronger today.  Little bit better coordination with the left hand today.  He wants a shower.  Wife states she will help him in the shower.  Objective: Vitals:   06/04/18 0500 06/04/18 0831  BP: 133/82 125/81  Pulse: 70 72  Resp: 16 18  Temp:  97.7 F (36.5 C)  SpO2: 96% 95%    Filed Weights   06/02/18 2144  Weight: 80.7 kg    ROS: Review of Systems  Constitutional: Negative for chills and fever.  Eyes: Negative for blurred vision.  Respiratory: Negative for cough and shortness of breath.   Cardiovascular: Negative for chest pain.  Gastrointestinal: Negative for abdominal pain, constipation, diarrhea, nausea and vomiting.  Genitourinary: Negative for dysuria.  Musculoskeletal: Negative for joint pain.  Neurological: Positive for dizziness and focal weakness. Negative for headaches.   Exam: Physical Exam  Constitutional: He is oriented to person, place, and time.  HENT:  Nose: No mucosal edema.  Mouth/Throat: No oropharyngeal exudate or posterior oropharyngeal edema.  Right tympanic membrane bulging and erythematous  Eyes: Pupils are equal, round, and reactive to light. Conjunctivae, EOM and lids are normal.  Neck: No JVD present. Carotid bruit is not present. No edema present. No thyroid mass and no thyromegaly present.  Cardiovascular: S1 normal and S2 normal. Exam reveals no gallop.  No murmur heard. Pulses:      Dorsalis pedis pulses are 2+ on the right side and 2+ on the left side.  Respiratory: No respiratory distress. He has no wheezes. He has no rhonchi. He has no rales.  GI: Soft. Bowel sounds are normal. There is no abdominal tenderness.  Musculoskeletal:     Right ankle: He exhibits no swelling.     Left ankle: He  exhibits no swelling.  Lymphadenopathy:    He has no cervical adenopathy.  Neurological: He is alert and oriented to person, place, and time. No cranial nerve deficit. He displays no Babinski's sign on the right side. He displays no Babinski's sign on the left side.  Left side 4+ out of 5 strength.  Right side 5 out of 5 strength.  Rapid finger movements left hand little bit better today than yesterday but still impaired.  Skin: Skin is warm. No rash noted. Nails show no clubbing.  Psychiatric: He has a normal mood and affect.      Data Reviewed: Basic Metabolic Panel: Recent Labs  Lab 06/02/18 2143  NA 134*  K 3.8  CL 102  CO2 27  GLUCOSE 211*  BUN 17  CREATININE 1.09  CALCIUM 9.2   Liver Function Tests: Recent Labs  Lab 06/02/18 2143  AST 27  ALT 25  ALKPHOS 51  BILITOT 0.8  PROT 7.2  ALBUMIN 4.4   CBC: Recent Labs  Lab 06/02/18 2143  WBC 6.0  NEUTROABS 3.9  HGB 14.5  HCT 43.3  MCV 92.7  PLT 218   Cardiac Enzymes: Recent Labs  Lab 06/02/18 2143  TROPONINI <0.03    CBG: Recent Labs  Lab 06/03/18 0749 06/03/18 1119 06/03/18 1641 06/04/18 0740 06/04/18 1151  GLUCAP 139* 130* 167* 155* 108*    Studies: Mr Brain Wo Contrast  Result Date: 06/03/2018 CLINICAL DATA:  Confusion and chest pain. EXAM: MRI HEAD WITHOUT CONTRAST MRA HEAD WITHOUT CONTRAST  TECHNIQUE: Multiplanar, multiecho pulse sequences of the brain and surrounding structures were obtained without intravenous contrast. Angiographic images of the head were obtained using MRA technique without contrast. COMPARISON:  Head CT from earlier today.  Brain MRI 07/31/2017 FINDINGS: MRI HEAD FINDINGS Brain: No acute infarction, acute hemorrhage, hydrocephalus, extra-axial collection or mass lesion. 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 There are scattered foci of remote microhemorrhage along the gray-white junction of the bilateral cerebrum. Mild chronic small vessel ischemic type gliosis in the cerebral white matter. AVM on the left, see below Vascular: See below Skull and upper cervical spine: Negative for marrow lesion Sinuses/Orbits: Bilateral cataract resection MRA HEAD FINDINGS Symmetric carotid and vertebral arteries. Wispy tangle arterial signal in the superficial upper left temporal lobe with venous shunting into a prominent left cortical vein. The nidus measures approximately 15 mm. There is no branch occlusion, flow limiting stenosis, or aneurysm at the circle-of-Willis. IMPRESSION: Brain MRI: 1. No acute finding. 2. 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 3. 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 The mass is stable from March 2019, new from February 2008. Intracranial MRA: 1. 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 2. No acute finding or flow limiting stenosis. Electronically Signed   By: Monte Fantasia M.D.   On: 06/03/2018 10:26   Mr Cervical Spine Wo Contrast  Result Date: 06/03/2018 CLINICAL DATA:  Subacute neuro deficit left-sided weakness for 4-5 days. EXAM: MRI CERVICAL SPINE WITHOUT CONTRAST TECHNIQUE: Multiplanar, multisequence MR imaging of the cervical spine was performed. No intravenous contrast was administered. COMPARISON:  None. FINDINGS: Alignment: Normal Vertebrae: No fracture, evidence of discitis, or bone lesion. Cord: Normal signal and morphology. Posterior Fossa, vertebral arteries, paraspinal tissues: Negative. Disc levels: C2-3: Unremarkable. C3-4: Minimal ridging and facet spurring.  No impingement C4-5: Shallow left paracentral protrusion.  No impingement C5-6: Disc narrowing with  asymmetric leftward uncovertebral ridging. Left foraminal impingement. Patent spinal canal C6-7: Disc narrowing with left foraminal protrusion and uncovertebral ridging. Left foraminal impingement. Patent canal and right foramen C7-T1:Unremarkable. IMPRESSION: 1. No acute finding or cord signal abnormality. 2. Degenerative left foraminal impingement at C5-6 and C6-7. Electronically Signed   By: Monte Fantasia M.D.   On: 06/03/2018 12:46   US Carotid Bilateral (at [facility] Only)  Result Date: 06/03/2018 CLINICAL DATA:  68 year old male with a history left-sided weakness EXAM: BILATERAL CAROTID DUPLEX ULTRASOUND TECHNIQUE: Pearline Cables scale imaging, color Doppler and duplex ultrasound were performed of bilateral carotid and vertebral arteries in the neck. COMPARISON:  No prior duplex FINDINGS: Criteria: Quantification of carotid stenosis is based on velocity parameters that correlate the residual internal carotid diameter with NASCET-based stenosis levels, using the diameter of the distal internal carotid lumen as the denominator for stenosis measurement. The following velocity measurements were obtained: RIGHT ICA:  Systolic 56 cm/sec, Diastolic 17 cm/sec CCA:  76 cm/sec SYSTOLIC ICA/CCA RATIO:  0.7 ECA:  99 cm/sec LEFT ICA:  Systolic 69 cm/sec, Diastolic 30 cm/sec CCA:  79 cm/sec SYSTOLIC ICA/CCA RATIO:  0.8 ECA:  90 cm/sec Right Brachial SBP: Not acquired Left Brachial SBP: Not acquired RIGHT CAROTID ARTERY: No significant calcifications of the right common carotid artery. Intermediate waveform  maintained. Heterogeneous and partially calcified plaque at the right carotid bifurcation. No significant lumen shadowing. Low resistance waveform of the right ICA. No significant tortuosity. RIGHT VERTEBRAL ARTERY: Antegrade flow with low resistance waveform. LEFT CAROTID ARTERY: No significant calcifications of the left common carotid artery. Intermediate waveform maintained. Heterogeneous and partially calcified plaque  at the left carotid bifurcation without significant lumen shadowing. Low resistance waveform of the left ICA. No significant tortuosity. LEFT VERTEBRAL ARTERY:  Antegrade flow with low resistance waveform. IMPRESSION: Color duplex indicates minimal heterogeneous and calcified plaque, with no hemodynamically significant stenosis by duplex criteria in the extracranial cerebrovascular circulation. Signed, Dulcy Fanny. Dellia Nims, RPVI [facility] Electronically Signed   By: Corrie Mckusick D.O.   On: 06/03/2018 09:47   Dg Chest Portable 1 View  Result Date: 06/02/2018 CLINICAL DATA:  Chest pain, syncope EXAM: PORTABLE CHEST 1 VIEW COMPARISON:  08/02/2017 FINDINGS: No acute opacity or pleural effusion. Stable cardiomediastinal silhouette. No pneumothorax. Old right-sided rib fractures. IMPRESSION: No active disease. Electronically Signed   By: Donavan Foil M.D.   On: 06/02/2018 22:23   Mr Jodene Nam Head/brain LF Cm  Result Date: 06/03/2018 CLINICAL DATA:  Confusion and chest pain. EXAM: MRI HEAD WITHOUT CONTRAST MRA HEAD WITHOUT CONTRAST TECHNIQUE: Multiplanar, multiecho pulse sequences of the brain and surrounding structures were obtained without intravenous contrast. Angiographic images of the head were obtained using MRA technique without contrast. COMPARISON:  Head CT from earlier today.  Brain MRI 07/31/2017 FINDINGS: MRI HEAD FINDINGS Brain: No acute infarction, acute hemorrhage, hydrocephalus, extra-axial collection or mass lesion. 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 There are scattered foci of remote microhemorrhage along the gray-white junction of the bilateral cerebrum. Mild chronic small vessel ischemic type gliosis in the  cerebral white matter. AVM on the left, see below Vascular: See below Skull and upper cervical spine: Negative for marrow lesion Sinuses/Orbits: Bilateral cataract resection MRA HEAD FINDINGS Symmetric carotid and vertebral arteries. Wispy tangle arterial signal in the superficial upper left temporal lobe with venous shunting into a prominent left cortical vein. The nidus measures approximately 15 mm. There is no branch occlusion, flow limiting stenosis, or aneurysm at the circle-of-Willis. IMPRESSION: Brain MRI: 1. No acute finding. 2. 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 3. 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 The mass is stable from March 2019, new from February 2008. Intracranial MRA: 1. 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 2. No acute finding or flow limiting stenosis. Electronically Signed   By: Monte Fantasia M.D.   On: 06/03/2018 10:26   Ct Head Code Stroke Wo Contrast`  Result Date: 06/02/2018 CLINICAL DATA:  Code stroke. 68 y/o M; decreased level of consciousness and headache. History of vascular malformation. EXAM: CT HEAD WITHOUT CONTRAST TECHNIQUE: Contiguous axial images were obtained from the base of the skull through the vertex without intravenous contrast. COMPARISON:  08/07/2017 CT head. FINDINGS: Brain: No evidence of acute infarction, hemorrhage, hydrocephalus, extra-axial collection or mass lesion/mass effect. 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 Stable nonspecific white matter hypodensities compatible chronic microvascular ischemic changes and volume loss of the brain. Vascular: Calcific atherosclerosis of carotid  siphons. No hyperdense vessel identified. Prominent cortical vein overlying the left lateral temporal and frontal lobe extending to the anterior aspect of the superior  sagittal sinus draining the vascular malformation of the right lateral temporal lobe (series 2, image 14). No mass or hemorrhage effect associated with the vascular malformation. Skull: Normal. Negative for fracture or focal lesion. Sinuses/Orbits: No acute finding. Other: None. ASPECTS [facility] Stroke Program Early CT Score) - Ganglionic level infarction (caudate, lentiform nuclei, internal capsule, insula, M1-M3 cortex): 7 - Supraganglionic infarction (M4-M6 cortex): 3 Total score (0-10 with 10 being normal): 10 IMPRESSION: 1. No acute intracranial abnormality identified. 2. ASPECTS is 10 3. Stable chronic microvascular ischemic changes and volume loss of the brain. 4. 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 No mass effect or hemorrhage associated with the malformation. These results were called by telephone at the time of interpretation on 06/02/2018 at 10:28 pm to Dr. Carrie Mew , who verbally acknowledged these results. Electronically Signed   By: Kristine Garbe M.D.   On: 06/02/2018 22:33    Scheduled Meds: . amoxicillin-clavulanate  1 tablet Oral Q12H  . ARIPiprazole  2 mg Oral Daily  . atorvastatin  80 mg Oral Daily  . carvedilol  25 mg Oral BID WC  . DULoxetine  60 mg Oral Daily  . enoxaparin (LOVENOX) injection  40 mg Subcutaneous Q24H  . insulin aspart  0-9 Units Subcutaneous TID WC  . pantoprazole  40 mg Oral Daily  . traZODone  50 mg Oral QHS   Continuous Infusions:  Assessment/Plan:  1. Left-sided weakness and syncope.  Case discussed with neurology and they do not believe that this is a stroke.  Patient was very weak with physical therapy and not able to do very much.  Await physical therapy reevaluation today because he feels stronger today.  Neurology wanted to get an EEG to rule out seizure as the cause of this. 2. Chronic headaches.  Patient states Fioricet helped.  PRN medication ordered.  Patient drinks a lot of Dr. Malachi Bonds  and some coffee in the morning. 3. Right-sided otitis media.  Continue Augmentin 4. Depression.  He states he will follow-up with his psychiatrist as outpatient.  Recently started on Abilify. 5. Hyperlipidemia unspecified on atorvastatin 6. Hypertension on Coreg 7. GERD on PPI  Code Status:     Code Status Orders  (From admission, onward)         Start     Ordered   06/03/18 0129  Full code  Continuous     06/03/18 0128        Code Status History    Date Active Date Inactive Code Status Order ID Comments User Context   07/30/2017 1703 08/01/2017 1937 Full Code [id number]  Epifanio Lesches, MD ED     Family Communication: Wife at the bedside Disposition Plan: Potentially home tomorrow after EEG done and read  Consultants:  Neurology  Antibiotics:  Augmentin  Time spent: 25 minutes  [place]

## 2018-06-05 ENCOUNTER — Observation Stay: Payer: Medicare Other

## 2018-06-05 DIAGNOSIS — R531 Weakness: Secondary | ICD-10-CM | POA: Diagnosis not present

## 2018-06-05 LAB — HIV ANTIBODY (ROUTINE TESTING W REFLEX): HIV Screen 4th Generation wRfx: NONREACTIVE

## 2018-06-05 LAB — GLUCOSE, CAPILLARY
Glucose-Capillary: 212 mg/dL — ABNORMAL HIGH (ref 70–99)
Glucose-Capillary: 129 mg/dL — ABNORMAL HIGH (ref 70–99)
Glucose-Capillary: 120 mg/dL — ABNORMAL HIGH (ref 70–99)

## 2018-06-05 MED ORDER — AMOXICILLIN-POT CLAVULANATE 875-125 MG PO TABS: 1.0000 | ORAL_TABLET | Freq: Two times a day (BID) | ORAL | 0 refills | Status: AC

## 2018-06-05 NOTE — Care Management Note (Signed)
Case Management Note  Patient Details  Name: Jon [name] MRN: [id number] Date of Birth: 01-06-1951  Subjective/Objective:                  Admitted to [facility] under observation status with the diagnosis of left sided weakness. Lives with wife, [name] [phone]) Seen Dr. Penne Lash at [facility] in November. Prescriptions are filled at [facility] on [address]. No home health in the past. No skilled facility in the past. No home oxygen. Rolling walker and C-PAP in the home.  [place] prior to this admission. Wife will transport.  CVA -   Action/Plan: Physical therapy is recommending skilled nursing facility. Declining this service at this time [facility] query. One copy placed on chart. One copy given to patient [facility],  Will update [place] with [facility]   Expected Discharge Date:                  Expected Discharge Plan:     In-House Referral:   yes  Discharge planning Services   yes  Post Acute Care Choice:    Choice offered to:     DME Arranged:    DME Agency:     HH Arranged:   yes [place] Agency:    [facility]  Status of Service:     If discussed at [place] of Stay Meetings, dates discussed:    Additional Comments:  Shelbie Ammons, RN MSN CCM Care Management [phone] 06/05/2018, 10:27 AM

## 2018-06-05 NOTE — Progress Notes (Signed)
Dc instructions given, iv dc, vss, follow up & meds clearly explained dc home

## 2018-06-05 NOTE — Discharge Summary (Signed)
Jon [name] at [place], 68 y.o., DOB 1950/07/25, MRN [id number]. Admission date: 06/02/2018 Discharge Date 06/05/2018 Primary MD Cooner, Deanne Coffer, MD Admitting Physician Lance Coon, MD  Admission Diagnosis  Nonspecific chest pain [R07.9] Ischemic stroke [facility]) [I63.9] Syncope, unspecified syncope type [R55]  Discharge Diagnosis   Principal Problem: Left-sided weakness with syncope stroke ruled out Chronic headaches Right-sided otitis media Depression Hyperlipidemia Hypertension GERD   Hospital Course  Jon [name]  is a 68 y.o. male who presents with chief complaint as above.  Patient presents to the ED with an episode of confusion and chest pain.  Patient also complains of left-sided weakness.  He was evaluated with a CT scan of the head which was negative.  He underwent MRI of his brain which was negative as well.  He was seen by neurology they did not feel that this was related to a stroke.  Patient's left-sided weakness is now resolved.  Patient also had a EEG which was negative.  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  Recommended he follow-up with his neurosurgeon.           Consults  neurology  Significant Tests:  See full reports for all details     Mr Brain Wo Contrast  Result Date: 06/03/2018 CLINICAL DATA:  Confusion and chest pain. EXAM: MRI HEAD WITHOUT CONTRAST MRA HEAD WITHOUT CONTRAST TECHNIQUE: Multiplanar, multiecho pulse sequences of the brain and surrounding structures were obtained without intravenous contrast. Angiographic images of the head were obtained using MRA technique without contrast. COMPARISON:  Head CT from earlier today.  Brain MRI 07/31/2017 FINDINGS: MRI HEAD FINDINGS Brain: No acute infarction, acute hemorrhage, hydrocephalus, extra-axial collection or mass lesion. 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 1 cm left pituitary mass, essentially stable from most recent prior, apparently  new from 2008. 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 There are scattered foci of remote microhemorrhage along the gray-white junction of the bilateral cerebrum. Mild chronic small vessel ischemic type gliosis in the cerebral white matter. AVM on the left, see below Vascular: See below Skull and upper cervical spine: Negative for marrow lesion Sinuses/Orbits: Bilateral cataract resection MRA HEAD FINDINGS Symmetric carotid and vertebral arteries. Wispy tangle arterial signal in the superficial upper left temporal lobe with venous shunting into a prominent left cortical vein. The nidus measures approximately 15 mm. There is no branch occlusion, flow limiting stenosis, or aneurysm at the circle-of-Willis. IMPRESSION: Brain MRI: 1. No acute finding. 2. 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 3. 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 The mass is stable from March 2019, new from February 2008. Intracranial MRA: 1. 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 2. No acute finding or flow limiting stenosis. Electronically Signed   By: Monte Fantasia M.D.   On: 06/03/2018 10:26   Mr Cervical Spine Wo Contrast  Result Date: 06/03/2018 CLINICAL DATA:  Subacute neuro deficit left-sided weakness for 4-5 days. EXAM: MRI CERVICAL SPINE WITHOUT CONTRAST TECHNIQUE: Multiplanar, multisequence MR imaging of the cervical spine was performed. No intravenous contrast was administered. COMPARISON:  None. FINDINGS: Alignment: Normal Vertebrae: No fracture, evidence of discitis, or bone lesion. Cord: Normal signal and morphology. Posterior Fossa, vertebral arteries, paraspinal tissues: Negative. Disc levels: C2-3: Unremarkable. C3-4: Minimal ridging and facet spurring.  No  impingement C4-5:  Shallow left paracentral protrusion.  No impingement C5-6: Disc narrowing with asymmetric leftward uncovertebral ridging. Left foraminal impingement. Patent spinal canal C6-7: Disc narrowing with left foraminal protrusion and uncovertebral ridging. Left foraminal impingement. Patent canal and right foramen C7-T1:Unremarkable. IMPRESSION: 1. No acute finding or cord signal abnormality. 2. Degenerative left foraminal impingement at C5-6 and C6-7. Electronically Signed   By: Monte Fantasia M.D.   On: 06/03/2018 12:46   US Carotid Bilateral (at [facility] Only)  Result Date: 06/03/2018 CLINICAL DATA:  68 year old male with a history left-sided weakness EXAM: BILATERAL CAROTID DUPLEX ULTRASOUND TECHNIQUE: Pearline Cables scale imaging, color Doppler and duplex ultrasound were performed of bilateral carotid and vertebral arteries in the neck. COMPARISON:  No prior duplex FINDINGS: Criteria: Quantification of carotid stenosis is based on velocity parameters that correlate the residual internal carotid diameter with NASCET-based stenosis levels, using the diameter of the distal internal carotid lumen as the denominator for stenosis measurement. The following velocity measurements were obtained: RIGHT ICA:  Systolic 56 cm/sec, Diastolic 17 cm/sec CCA:  76 cm/sec SYSTOLIC ICA/CCA RATIO:  0.7 ECA:  99 cm/sec LEFT ICA:  Systolic 69 cm/sec, Diastolic 30 cm/sec CCA:  79 cm/sec SYSTOLIC ICA/CCA RATIO:  0.8 ECA:  90 cm/sec Right Brachial SBP: Not acquired Left Brachial SBP: Not acquired RIGHT CAROTID ARTERY: No significant calcifications of the right common carotid artery. Intermediate waveform maintained. Heterogeneous and partially calcified plaque at the right carotid bifurcation. No significant lumen shadowing. Low resistance waveform of the right ICA. No significant tortuosity. RIGHT VERTEBRAL ARTERY: Antegrade flow with low resistance waveform. LEFT CAROTID ARTERY: No significant calcifications of the left common carotid artery.  Intermediate waveform maintained. Heterogeneous and partially calcified plaque at the left carotid bifurcation without significant lumen shadowing. Low resistance waveform of the left ICA. No significant tortuosity. LEFT VERTEBRAL ARTERY:  Antegrade flow with low resistance waveform. IMPRESSION: Color duplex indicates minimal heterogeneous and calcified plaque, with no hemodynamically significant stenosis by duplex criteria in the extracranial cerebrovascular circulation. Signed, Dulcy Fanny. Dellia Nims, RPVI [facility] Electronically Signed   By: Corrie Mckusick D.O.   On: 06/03/2018 09:47   Dg Chest Portable 1 View  Result Date: 06/02/2018 CLINICAL DATA:  Chest pain, syncope EXAM: PORTABLE CHEST 1 VIEW COMPARISON:  08/02/2017 FINDINGS: No acute opacity or pleural effusion. Stable cardiomediastinal silhouette. No pneumothorax. Old right-sided rib fractures. IMPRESSION: No active disease. Electronically Signed   By: Donavan Foil M.D.   On: 06/02/2018 22:23   Mr Jodene Nam Head/brain JJ Cm  Result Date: 06/03/2018 CLINICAL DATA:  Confusion and chest pain. EXAM: MRI HEAD WITHOUT CONTRAST MRA HEAD WITHOUT CONTRAST TECHNIQUE: Multiplanar, multiecho pulse sequences of the brain and surrounding structures were obtained without intravenous contrast. Angiographic images of the head were obtained using MRA technique without contrast. COMPARISON:  Head CT from earlier today.  Brain MRI 07/31/2017 FINDINGS: MRI HEAD FINDINGS Brain: No acute infarction, acute hemorrhage, hydrocephalus, extra-axial collection or mass lesion. 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 There are scattered foci of remote microhemorrhage along the gray-white junction of the  bilateral cerebrum. Mild chronic small vessel ischemic type gliosis in the cerebral white matter. AVM on the left, see below Vascular: See below Skull and upper cervical spine: Negative for marrow lesion Sinuses/Orbits: Bilateral  cataract resection MRA HEAD FINDINGS Symmetric carotid and vertebral arteries. Wispy tangle arterial signal in the superficial upper left temporal lobe with venous shunting into a prominent left cortical vein. The nidus measures approximately 15 mm. There is no branch occlusion, flow limiting stenosis, or aneurysm at the circle-of-Willis. IMPRESSION: Brain MRI: 1. No acute finding. 2. 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 3. 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 The mass is stable from March 2019, new from February 2008. Intracranial MRA: 1. 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 2. No acute finding or flow limiting stenosis. Electronically Signed   By: Monte Fantasia M.D.   On: 06/03/2018 10:26   Ct Head Code Stroke Wo Contrast`  Result Date: 06/02/2018 CLINICAL DATA:  Code stroke. 68 y/o M; decreased level of consciousness and headache. History of vascular malformation. EXAM: CT HEAD WITHOUT CONTRAST TECHNIQUE: Contiguous axial images were obtained from the base of the skull through the vertex without intravenous contrast. COMPARISON:  08/07/2017 CT head. FINDINGS: Brain: No evidence of acute infarction, hemorrhage, hydrocephalus, extra-axial collection or mass lesion/mass effect. 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 Stable nonspecific white matter hypodensities compatible chronic microvascular ischemic changes and volume loss of the brain. Vascular: Calcific atherosclerosis of carotid siphons. No hyperdense vessel identified. Prominent cortical vein overlying the left lateral  temporal and frontal lobe extending to the anterior aspect of the superior sagittal sinus draining the vascular malformation of the right lateral temporal lobe (series 2, image 14). No mass or hemorrhage effect associated with the vascular malformation. Skull: Normal. Negative for fracture or focal lesion. Sinuses/Orbits: No acute finding. Other: None. ASPECTS [facility] Stroke Program Early CT Score) - Ganglionic level infarction (caudate, lentiform nuclei, internal capsule, insula, M1-M3 cortex): 7 - Supraganglionic infarction (M4-M6 cortex): 3 Total score (0-10 with 10 being normal): 10 IMPRESSION: 1. No acute intracranial abnormality identified. 2. ASPECTS is 10 3. Stable chronic microvascular ischemic changes and volume loss of the brain. 4. 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 No mass effect or hemorrhage associated with the malformation. These results were called by telephone at the time of interpretation on 06/02/2018 at 10:28 pm to Dr. Carrie Mew , who verbally acknowledged these results. Electronically Signed   By: Kristine Garbe M.D.   On: 06/02/2018 22:33       Today   Subjective:   Jon [name] patient currently feeling better has some chronic headaches Objective:   Blood pressure 122/85, pulse 71, temperature 97.9 F (36.6 C), temperature source Oral, resp. rate 18, height 5\' 6"  (1.676 m), weight 80.7 kg, SpO2 97 %.  .  Intake/Output Summary (Last 24 hours) at 06/05/2018 1324 Last data filed at 06/05/2018 0911 Gross per 24 hour  Intake 240 ml  Output 400 ml  Net -160 ml    Exam VITAL SIGNS: Blood pressure 122/85, pulse 71, temperature 97.9 F (36.6 C), temperature source Oral, resp. rate 18, height 5\' 6"  (1.676 m), weight 80.7 kg, SpO2 97 %.  GENERAL:  68 y.o.-year-old patient lying in the bed with no acute distress.  EYES: Pupils equal, round, reactive to light and accommodation. No scleral icterus. Extraocular muscles  intact.  HEENT: Head atraumatic, normocephalic. Oropharynx and nasopharynx clear.  NECK:  Supple, no jugular venous distention. No thyroid enlargement, no tenderness.  LUNGS: Normal breath sounds bilaterally,  no wheezing, rales,rhonchi or crepitation. No use of accessory muscles of respiration.  CARDIOVASCULAR: S1, S2 normal. No murmurs, rubs, or gallops.  ABDOMEN: Soft, nontender, nondistended. Bowel sounds present. No organomegaly or mass.  EXTREMITIES: No pedal edema, cyanosis, or clubbing.  NEUROLOGIC: Cranial nerves II through XII are intact. Muscle strength 5/5 in all extremities. Sensation intact. Gait not checked.  PSYCHIATRIC: The patient is alert and oriented x 3.  SKIN: No obvious rash, lesion, or ulcer.   Data Review     CBC w Diff:  Lab Results  Component Value Date   WBC 6.0 06/02/2018   HGB 14.5 06/02/2018   HCT 43.3 06/02/2018   PLT 218 06/02/2018   LYMPHOPCT 25 06/02/2018   MONOPCT 7 06/02/2018   EOSPCT 2 06/02/2018   BASOPCT 0 06/02/2018   CMP:  Lab Results  Component Value Date   NA 134 (L) 06/02/2018   K 3.8 06/02/2018   CL 102 06/02/2018   CO2 27 06/02/2018   BUN 17 06/02/2018   CREATININE 1.09 06/02/2018   PROT 7.2 06/02/2018   ALBUMIN 4.4 06/02/2018   BILITOT 0.8 06/02/2018   ALKPHOS 51 06/02/2018   AST 27 06/02/2018   ALT 25 06/02/2018  .  Micro Results No results found for this or any previous visit (from the past 240 hour(s)).      Code Status Orders  (From admission, onward)         Start     Ordered   06/03/18 0129  Full code  Continuous     06/03/18 0128        Code Status History    Date Active Date Inactive Code Status Order ID Comments User Context   07/30/2017 1703 08/01/2017 1937 Full Code [id number]  Epifanio Lesches, MD ED          Follow-up Information    Cooner, Deanne Coffer, MD Follow up in 6 day(s).   Specialty:  Internal Medicine Contact information: [address] [place]  [address] [phone]           Discharge Medications   Allergies as of 06/05/2018      Reactions   Beta Adrenergic Blockers Itching   Topiramate Nausea And Vomiting   Other reaction(s): Dizziness, Hallucination Other Reaction: violence   Aspirin Other (See Comments)   Per pt's PCP due to spontaneous bleeding in his brain.   Benadryl [diphenhydramine] Hives   Lisinopril Itching   Other reaction(s): Other (See Comments) Leg pain   Nabumetone Other (See Comments)   Reaction: unknown   Valproic Acid Other (See Comments)   Reaction: Headache      Medication List    STOP taking these medications   ondansetron 4 MG disintegrating tablet Commonly known as:  ZOFRAN ODT   venlafaxine XR 37.5 MG 24 hr capsule Commonly known as:  EFFEXOR-XR     TAKE these medications   amoxicillin-clavulanate 875-125 MG tablet Commonly known as:  AUGMENTIN Take 1 tablet by mouth every 12 (twelve) hours for 4 days.   ARIPiprazole 2 MG tablet Commonly known as:  ABILIFY Take 2 mg by mouth daily.   butalbital-acetaminophen-caffeine 50-325-40 MG tablet Commonly known as:  FIORICET, ESGIC Take 1-2 tablets by mouth every 6 (six) hours as needed for headache. What changed:    how much to take  when to take this   carvedilol 25 MG tablet Commonly known as:  COREG Take 25 mg by mouth 2 (two) times daily with a  meal.   D3-1000 25 MCG (1000 UT) tablet Generic drug:  Cholecalciferol Take 1,000 Units by mouth daily.   DULoxetine 60 MG capsule Commonly known as:  CYMBALTA Take 60 mg by mouth daily.   gabapentin 300 MG capsule Commonly known as:  NEURONTIN Take 600 mg by mouth 2 (two) times daily.   glimepiride 2 MG tablet Commonly known as:  AMARYL Take 2 mg by mouth 2 (two) times daily.   hydrOXYzine 10 MG tablet Commonly known as:  ATARAX/VISTARIL Take 1 tablet by mouth 3 (three) times daily as needed for itching.   ibuprofen 200 MG tablet Commonly known as:  ADVIL,MOTRIN Take  200 mg by mouth every 6 (six) hours as needed for mild pain.   losartan-hydrochlorothiazide 100-12.5 MG tablet Commonly known as:  HYZAAR Take 1 tablet by mouth daily.   pantoprazole 40 MG tablet Commonly known as:  PROTONIX Take 40 mg by mouth daily.   testosterone cypionate 200 MG/ML injection Commonly known as:  DEPOTESTOSTERONE CYPIONATE Inject 200 mg into the muscle every 14 (fourteen) days.   traZODone 50 MG tablet Commonly known as:  DESYREL Take 50 mg by mouth at bedtime.   vitamin B-12 1000 MCG tablet Commonly known as:  CYANOCOBALAMIN Take 1,000 mcg by mouth daily.          Total Time in preparing paper work, data evaluation and todays exam - 91 minutes  Dustin Flock M.D on 06/05/2018 at [place]  [phone]

## 2018-06-05 NOTE — Plan of Care (Signed)
  Problem: Education: Goal: Knowledge of disease or condition will improve Outcome: Progressing Goal: Knowledge of secondary prevention will improve Outcome: Progressing Goal: Knowledge of patient specific risk factors addressed and post discharge goals established will improve Outcome: Progressing   Problem: Coping: Goal: Will verbalize positive feelings about self Outcome: Progressing Goal: Will identify appropriate support needs Outcome: Progressing   Problem: Health Behavior/Discharge Planning: Goal: Ability to manage health-related needs will improve Outcome: Progressing   Problem: Self-Care: Goal: Ability to participate in self-care as condition permits will improve Outcome: Progressing Goal: Verbalization of feelings and concerns over difficulty with self-care will improve Outcome: Progressing Goal: Ability to communicate needs accurately will improve Outcome: Progressing   Problem: Education: Goal: Knowledge of secondary prevention will improve Outcome: Progressing   Problem: Ischemic Stroke/TIA Tissue Perfusion: Goal: Complications of ischemic stroke/TIA will be minimized Outcome: Progressing   Problem: Education: Goal: Knowledge of General Education information will improve Description Including pain rating scale, medication(s)/side effects and non-pharmacologic comfort measures Outcome: Progressing   Problem: Clinical Measurements: Goal: Ability to maintain clinical measurements within normal limits will improve Outcome: Progressing   Problem: Safety: Goal: Ability to remain free from injury will improve Outcome: Progressing

## 2018-06-05 NOTE — Procedures (Signed)
ELECTROENCEPHALOGRAM REPORT   Patient: Jon [name]       Room #: 124A-AA EEG No. ID: [id number] Age: 68 y.o.        Sex: male Referring Physician: Posey Pronto Report Date:  06/05/2018        Interpreting Physician: Alexis Goodell  History: Jon [name] is an 68 y.o. male with headache and transient left sided weakness  Medications:  Protonix, Abilify, Lipitor, Coreg, Cymbalta, Insulin, Desyrel  Conditions of Recording:  This is a 21 channel routine scalp EEG performed with bipolar and monopolar montages arranged in accordance to the international 10/20 system of electrode placement. One channel was dedicated to EKG recording.  The patient is in the awake, drowsy and asleep states.  Description:  The waking background activity consists of a low voltage, symmetrical, fairly well organized, 7.5 Hz theta activity, seen from the parieto-occipital and posterior temporal regions.  Low voltage fast activity, poorly organized, is seen anteriorly and is at times superimposed on more posterior regions.  A mixture of theta and alpha rhythms are seen from the central and temporal regions. The patient drowses with slowing to irregular, low voltage theta and beta activity.   The patient goes in to a light sleep with symmetrical sleep spindles, vertex central sharp transients and irregular slow activity.  No epileptiform activity is noted.   Hyperventilation was not performed.  Intermittent photic stimulation was performed and elicits a symmetrical driving response but fails to elicit any abnormalities.  IMPRESSION: This is a mildly abnormal EEG secondary to mild posterior background slowing.  This finding may be seen with a diffuse gray matter disturbance that is etiologically nonspecific, but may include an early dementia, among other possibilities.  There are no focal lateralizing or epileptiform features.   Alexis Goodell, MD Neurology [phone] 06/05/2018, 1:10 PM

## 2018-06-05 NOTE — Discharge Instructions (Signed)
Near-Syncope Near-syncope is when you suddenly get weak or dizzy, or you feel like you might pass out (faint). This is due to a lack of blood flow to the brain. During an episode of near-syncope, you may:  Feel dizzy or light-headed.  Feel sick to your stomach (nauseous).  See all white or all black.  Have cold, clammy skin. This condition is caused by a sudden decrease in blood flow to the brain. This decrease can result from various causes, but most of those causes are not dangerous. However, near-syncope may be a sign of a serious medical problem, so it is important to seek medical care. Follow these instructions at home: Pay attention to any changes in your symptoms. Take these actions to help with your condition:  Have someone stay with you until you feel stable.  Talk with your doctor about your symptoms. You may need to have testing to understand the cause of your near-syncope.  Do not drive, use machinery, or play sports until your doctor says it is okay.  Keep all follow-up visits as told by your doctor. This is important.  If you start to feel like you might pass out, lie down right away and raise (elevate) your feet above the level of your heart. Breathe deeply and steadily. Wait until all of the symptoms are gone.  Drink enough fluid to keep your pee (urine) pale yellow. Medicines  If you are taking blood pressure or heart medicine, get up slowly and spend many minutes getting ready to sit and then stand. This can help with dizziness.  Take over-the-counter and prescription medicines only as told by your doctor. Get help right away if you:  Have a seizure.  Have pain in your: ? Chest. ? Tummy, ? Back.  Faint.  Have a bad headache.  Are bleeding from your mouth or butt.  Have black or tarry poop (stool).  Have a very fast or uneven heartbeat (palpitations).  Are confused.  Have trouble walking.  Are very weak.  Have trouble seeing. These symptoms may  represent a serious problem that is an emergency. Do not wait to see if your symptoms will go away. Get medical help right away. Call your local emergency services (911 in the [place] Do not drive yourself to the hospital. Summary  Near-syncope is when you suddenly get weak or dizzy, or you feel like you might pass out.  This condition is caused by a lack of blood flow to the brain.  Near-syncope may be a sign of a serious medical problem, so it is important to seek medical care. This information is not intended to replace advice given to you by your health care provider. Make sure you discuss any questions you have with your health care provider. Document Released: 10/13/2007 Document Revised: 12/20/2017 Document Reviewed: 01/08/2015 Elsevier Interactive Patient Education  2019 [employer].   Ischemic Stroke  An ischemic stroke is the sudden death of brain tissue. Blood carries oxygen to all areas of the body. This type of stroke happens when your blood does not flow to your brain like normal. Your brain cannot get the oxygen it needs. This is an emergency. It must be treated right away. Symptoms of a stroke usually happen all of a sudden. You may notice them when you wake up. They can include:  Weakness or loss of feeling in your face, arm, or leg. This often happens on one side of the body.  Trouble walking.  Trouble moving your arms or  legs.  Loss of balance or coordination.  Feeling confused.  Trouble talking or understanding what people are saying.  Slurred speech.  Trouble seeing.  Seeing two of one object (double vision).  Feeling dizzy.  Feeling sick to your stomach (nauseous) and throwing up (vomiting).  A very bad headache for no reason. Get help as soon as any of these problems start. This is important. Some treatments work better if they are given right away. These include:  Aspirin.  Medicines to control blood pressure.  A shot (injection) of medicine to  break up the blood clot.  Treatments given in the blood vessel (artery) to take out the clot or break it up. Other treatments may include:  Oxygen.  Fluids given through an IV tube.  Medicines to thin out your blood.  Procedures to help your blood flow better. What increases the risk? Certain things may make you more likely to have a stroke. Some of these are things that you can change, such as:  Being very overweight (obesity).  Smoking.  Taking birth control pills.  Not being active.  Drinking too much alcohol.  Using drugs. Other risk factors include:  High blood pressure.  High cholesterol.  Diabetes.  Heart disease.  Being [place] American, Native American, Hispanic, or [place] Native.  Being over age 69.  Family history of stroke.  Having had blood clots, stroke, or warning stroke (transient ischemic attack, TIA) in the past.  Sickle cell disease.  Being a woman with a history of high blood pressure in pregnancy (preeclampsia).  Migraine headache.  Sleep apnea.  Having an irregular heartbeat (atrial fibrillation).  Long-term (chronic) diseases that cause soreness and swelling (inflammation).  Disorders that affect how your blood clots. Follow these instructions at home: Medicines  Take over-the-counter and prescription medicines only as told by your doctor.  If you were told to take aspirin or another medicine to thin your blood, take it exactly as told by your doctor. ? Taking too much of the medicine can cause bleeding. ? If you do not take enough, it may not work as well.  Know the side effects of your medicines. If you are taking a blood thinner, make sure you: ? Hold pressure over any cuts for longer than usual. ? Tell your dentist and other doctors that you take this medicine. ? Avoid activities that may cause damage or injury to your body. Eating and drinking  Follow instructions from your doctor about what you cannot eat or  drink.  Eat healthy foods.  If you have trouble with swallowing, do these things to avoid choking: ? Take small bites when eating. ? Eat foods that are soft or pureed. Safety  Follow instructions from your health care team about physical activity.  Use a walker or cane as told by your doctor.  Keep your home safe so you do not fall. This may include: ? Having experts look at your home to make sure it is safe. ? Putting grab bars in the bedroom and bathroom. ? Using raised toilets. ? Putting a seat in the shower. General instructions  Do not use any tobacco products. ? Examples of these are cigarettes, chewing tobacco, and e-cigarettes. ? If you need help quitting, ask your doctor.  Limit how much alcohol you drink. This means no more than 1 drink a day for nonpregnant women and 2 drinks a day for men. One drink equals 12 oz of beer, 5 oz of wine, or 1 oz of hard  liquor.  If you need help to stop using drugs or alcohol, ask your doctor to refer you to a program or specialist.  Stay active. Exercise as told by your doctor.  Keep all follow-up visits as told by your doctor. This is important. Get help right away if:   You have any signs of a stroke. "BE FAST" is an easy way to remember the main warning signs: ? B - Balance. Signs are dizziness, sudden trouble walking, or loss of balance. ? E - Eyes. Signs are trouble seeing or a change in how you see. ? F - Face. Signs are sudden weakness or loss of feeling of the face, or the face or eyelid drooping on one side. ? A - Arms. Signs are weakness or loss of feeling in an arm. This happens suddenly and usually on one side of the body. ? S - Speech. Signs are sudden trouble speaking, slurred speech, or trouble understanding what people say. ? T - Time. Time to call emergency services. Write down what time symptoms started.  You have other signs of a stroke, such as: ? A sudden, very bad headache with no known cause. ? Feeling  sick to your stomach (nausea). ? Throwing up (vomiting). ? Jerky movements you cannot control (seizure). These symptoms may be an emergency. Do not wait to see if the symptoms will go away. Get medical help right away. Call your local emergency services (911 in the [place] Do not drive yourself to the hospital. Summary  An ischemic stroke is the sudden death of brain tissue.  Symptoms of a stroke usually happen all of a sudden. You may notice them when you wake up.  Get help if you have any warning signs of a stroke. This is important. Some treatments work better if they are given right away. This information is not intended to replace advice given to you by your health care provider. Make sure you discuss any questions you have with your health care provider. Document Released: 04/15/2011 Document Revised: 10/05/2017 Document Reviewed: 07/23/2015 Elsevier Interactive Patient Education  2019 [employer].

## 2018-06-05 NOTE — Progress Notes (Signed)
eeg done °

## 2018-06-05 NOTE — Progress Notes (Signed)
Physical Therapy Treatment Patient Details Name: Jon [name] MRN: [id number] DOB: November 09, 1950 Today's Date: 06/05/2018    History of Present Illness 68 yo male with onset of L side weakness and confusion with chest pain was admitted for evaluation.  Had negative CT for stroke, MRI planned.  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  PMHx:  R rib fractures, DM, HTN, TIA, spinal fracture, head injury with concussion    PT Comments    Pt agreeable to PT primarily for gait training/education regarding stairs, ambulation and safety. Pt has significant HA pain due to migraine. Pt able to demonstrate improved sit/stand balance and ability to ambulate. Pt does not wish to ambulate to steps due to migraine, but offers prior sequencing due to LLE weakness that is chronic. Educated pt/spouse. Spouse will be with pt and able to provide Min guard to Min A as needed for ambulation/stairs. Recommend rolling walker at this time for safety.    Follow Up Recommendations  Home health PT;Supervision/Assistance - 24 hour     Equipment Recommendations  Rolling walker with 5" wheels    Recommendations for Other Services       Precautions / Restrictions Precautions Precautions: Fall Restrictions Weight Bearing Restrictions: No    Mobility  Bed Mobility               General bed mobility comments: Not tested; OOB  Transfers Overall transfer level: Modified independent Equipment used: Rolling walker (2 wheeled) Transfers: Sit to/from Stand Sit to Stand: Modified independent (Device/Increase time)         General transfer comment: No physical assist  Ambulation/Gait Ambulation/Gait assistance: Supervision Gait Distance (Feet): 40 Feet Assistive device: Rolling walker (2 wheeled)       General Gait Details: Ambulates within the room. Spouse will be with pt   Stairs Stairs: Yes       General stair comments: Educated verbally; pt notes he has been negotiating steps up with R/down with L due to  chronic L sided weakness. Pt has B rails. Spouse able to provide Min guard to Min A as needed.    Wheelchair Mobility    Modified Rankin (Stroke Patients Only)       Balance Overall balance assessment: Needs assistance;History of Falls   Sitting balance-Leahy Scale: Good     Standing balance support: Bilateral upper extremity supported Standing balance-Leahy Scale: Fair                              Cognition Arousal/Alertness: Awake/alert Behavior During Therapy: WFL for tasks assessed/performed Overall Cognitive Status: Within Functional Limits for tasks assessed                                        Exercises      General Comments        Pertinent Vitals/Pain Pain Assessment: (Terrible HA (migraine))    Home Living                      Prior Function            PT Goals (current goals can now be found in the care plan section) Progress towards PT goals: Progressing toward goals    Frequency    Min 2X/week      PT Plan Current plan remains appropriate    Co-evaluation  AM-PAC PT "6 Clicks" Mobility   Outcome Measure  Help needed turning from your back to your side while in a flat bed without using bedrails?: None Help needed moving from lying on your back to sitting on the side of a flat bed without using bedrails?: None Help needed moving to and from a bed to a chair (including a wheelchair)?: A Little Help needed standing up from a chair using your arms (e.g., wheelchair or bedside chair)?: A Little Help needed to walk in hospital room?: A Little Help needed climbing 3-5 steps with a railing? : A Little 6 Click Score: 20    End of Session   Activity Tolerance: Patient limited by pain(Migraine) Patient left: in chair;with family/visitor present;Other (comment)(pt going to prepare for discharge)   PT Visit Diagnosis: Unsteadiness on feet (R26.81);Muscle weakness (generalized)  (M62.81);Difficulty in walking, not elsewhere classified (R26.2)     Time: 1610-9604 PT Time Calculation (min) (ACUTE ONLY): 23 min  Charges:  $Gait Training: 8-22 mins $Therapeutic Activity: 8-22 mins                      Larae Grooms, PTA 06/05/2018, 2:31 PM

## 2018-07-01 DIAGNOSIS — F5105 Insomnia due to other mental disorder: Secondary | ICD-10-CM | POA: Insufficient documentation

## 2018-08-19 ENCOUNTER — Inpatient Hospital Stay: Payer: Medicare Other

## 2018-08-19 ENCOUNTER — Emergency Department: Payer: Medicare Other

## 2018-08-19 ENCOUNTER — Other Ambulatory Visit: Payer: Self-pay

## 2018-08-19 ENCOUNTER — Inpatient Hospital Stay
Admission: EM | Admit: 2018-08-19 | Discharge: 2018-08-21 | DRG: 064 | Disposition: A | Discharge status: Home / Self Care | Payer: Medicare Other | Attending: Family Medicine | Admitting: Family Medicine

## 2018-08-19 DIAGNOSIS — Z820 Family history of epilepsy and other diseases of the nervous system: Secondary | ICD-10-CM | POA: Diagnosis not present

## 2018-08-19 DIAGNOSIS — Z888 Allergy status to other drugs, medicaments and biological substances status: Secondary | ICD-10-CM | POA: Diagnosis not present

## 2018-08-19 DIAGNOSIS — H539 Unspecified visual disturbance: Secondary | ICD-10-CM | POA: Diagnosis present

## 2018-08-19 DIAGNOSIS — I63531 Cerebral infarction due to unspecified occlusion or stenosis of right posterior cerebral artery: Principal | ICD-10-CM | POA: Diagnosis present

## 2018-08-19 DIAGNOSIS — G894 Chronic pain syndrome: Secondary | ICD-10-CM | POA: Diagnosis present

## 2018-08-19 DIAGNOSIS — Z79890 Hormone replacement therapy (postmenopausal): Secondary | ICD-10-CM | POA: Diagnosis not present

## 2018-08-19 DIAGNOSIS — Z8673 Personal history of transient ischemic attack (TIA), and cerebral infarction without residual deficits: Secondary | ICD-10-CM

## 2018-08-19 DIAGNOSIS — I639 Cerebral infarction, unspecified: Secondary | ICD-10-CM

## 2018-08-19 DIAGNOSIS — Z8249 Family history of ischemic heart disease and other diseases of the circulatory system: Secondary | ICD-10-CM | POA: Diagnosis not present

## 2018-08-19 DIAGNOSIS — I10 Essential (primary) hypertension: Secondary | ICD-10-CM | POA: Diagnosis present

## 2018-08-19 DIAGNOSIS — I340 Nonrheumatic mitral (valve) insufficiency: Secondary | ICD-10-CM | POA: Diagnosis not present

## 2018-08-19 DIAGNOSIS — E1165 Type 2 diabetes mellitus with hyperglycemia: Secondary | ICD-10-CM | POA: Diagnosis present

## 2018-08-19 DIAGNOSIS — Z886 Allergy status to analgesic agent status: Secondary | ICD-10-CM

## 2018-08-19 DIAGNOSIS — Z79899 Other long term (current) drug therapy: Secondary | ICD-10-CM | POA: Diagnosis not present

## 2018-08-19 DIAGNOSIS — K219 Gastro-esophageal reflux disease without esophagitis: Secondary | ICD-10-CM | POA: Diagnosis present

## 2018-08-19 DIAGNOSIS — Q282 Arteriovenous malformation of cerebral vessels: Secondary | ICD-10-CM

## 2018-08-19 DIAGNOSIS — I728 Aneurysm of other specified arteries: Secondary | ICD-10-CM | POA: Diagnosis present

## 2018-08-19 DIAGNOSIS — G4700 Insomnia, unspecified: Secondary | ICD-10-CM | POA: Diagnosis present

## 2018-08-19 DIAGNOSIS — D352 Benign neoplasm of pituitary gland: Secondary | ICD-10-CM | POA: Diagnosis present

## 2018-08-19 HISTORY — DX: Neoplasm of unspecified behavior of endocrine glands and other parts of nervous system: D49.7

## 2018-08-19 LAB — COMPREHENSIVE METABOLIC PANEL
ALT: 29 U/L (ref 0–44)
AST: 28 U/L (ref 15–41)
Albumin: 4.6 g/dL (ref 3.5–5.0)
Alkaline Phosphatase: 57 U/L (ref 38–126)
Anion gap: 12 (ref 5–15)
BUN: 11 mg/dL (ref 8–23)
CO2: 24 mmol/L (ref 22–32)
Calcium: 9.1 mg/dL (ref 8.9–10.3)
Chloride: 101 mmol/L (ref 98–111)
Creatinine, Ser: 0.93 mg/dL (ref 0.61–1.24)
GFR calc Af Amer: >60 mL/min (ref >60)
GFR calc non Af Amer: >60 mL/min (ref >60)
Glucose, Bld: 191 mg/dL — ABNORMAL HIGH (ref 70–99)
Potassium: 4.1 mmol/L (ref 3.5–5.1)
Sodium: 137 mmol/L (ref 135–145)
Total Bilirubin: 0.5 mg/dL (ref 0.3–1.2)
Total Protein: 7.1 g/dL (ref 6.5–8.1)

## 2018-08-19 LAB — CBC
HCT: 46.2 % (ref 39.0–52.0)
Hemoglobin: 15.7 g/dL (ref 13.0–17.0)
MCH: 30.6 pg (ref 26.0–34.0)
MCHC: 34 g/dL (ref 30.0–36.0)
MCV: 90.1 fL (ref 80.0–100.0)
Platelets: 181 10*3/uL (ref 150–400)
RBC: 5.13 MIL/uL (ref 4.22–5.81)
RDW: 12.4 % (ref 11.5–15.5)
WBC: 4.6 10*3/uL (ref 4.0–10.5)
nRBC: 0 % (ref 0.0–0.2)

## 2018-08-19 LAB — ETHANOL: Alcohol, Ethyl (B): <10 mg/dL (ref <10)

## 2018-08-19 LAB — URINE DRUG SCREEN, QUALITATIVE (ARMC ONLY)
Amphetamines, Ur Screen: NOT DETECTED
Barbiturates, Ur Screen: POSITIVE — AB
Benzodiazepine, Ur Scrn: NOT DETECTED
Cannabinoid 50 Ng, Ur ~~LOC~~: NOT DETECTED
Cocaine Metabolite,Ur ~~LOC~~: NOT DETECTED
MDMA (Ecstasy)Ur Screen: NOT DETECTED
Methadone Scn, Ur: NOT DETECTED
Opiate, Ur Screen: NOT DETECTED
Phencyclidine (PCP) Ur S: NOT DETECTED
Tricyclic, Ur Screen: POSITIVE — AB

## 2018-08-19 LAB — DIFFERENTIAL
Abs Immature Granulocytes: 0.03 10*3/uL (ref 0.00–0.07)
Basophils Absolute: 0 10*3/uL (ref 0.0–0.1)
Basophils Relative: 0 %
Eosinophils Absolute: 0.2 10*3/uL (ref 0.0–0.5)
Eosinophils Relative: 3 %
Immature Granulocytes: 1 %
Lymphocytes Relative: 32 %
Lymphs Abs: 1.5 10*3/uL (ref 0.7–4.0)
Monocytes Absolute: 0.4 10*3/uL (ref 0.1–1.0)
Monocytes Relative: 9 %
Neutro Abs: 2.5 10*3/uL (ref 1.7–7.7)
Neutrophils Relative %: 55 %

## 2018-08-19 LAB — URINALYSIS, ROUTINE W REFLEX MICROSCOPIC
Bilirubin Urine: NEGATIVE
Glucose, UA: 50 mg/dL — AB
Hgb urine dipstick: NEGATIVE
Ketones, ur: NEGATIVE mg/dL
Leukocytes,Ua: NEGATIVE
Nitrite: NEGATIVE
Protein, ur: NEGATIVE mg/dL
Specific Gravity, Urine: 1.016 (ref 1.005–1.030)
pH: 6 (ref 5.0–8.0)

## 2018-08-19 LAB — APTT: aPTT: 33 seconds (ref 24–36)

## 2018-08-19 LAB — MAGNESIUM: Magnesium: 2.4 mg/dL (ref 1.7–2.4)

## 2018-08-19 LAB — PROTIME-INR
INR: 0.9 (ref 0.8–1.2)
Prothrombin Time: 12.2 seconds (ref 11.4–15.2)

## 2018-08-19 LAB — PHOSPHORUS: Phosphorus: 2.4 mg/dL — ABNORMAL LOW (ref 2.5–4.6)

## 2018-08-19 LAB — GLUCOSE, CAPILLARY: Glucose-Capillary: 174 mg/dL — ABNORMAL HIGH (ref 70–99)

## 2018-08-19 MED ORDER — VITAMIN D 25 MCG (1000 UNIT) PO TABS
1000.0000 [IU] | ORAL_TABLET | Freq: Every day | ORAL | Status: DC
  Administered 2018-08-20 – 2018-08-21 (×2): 1000 [IU] via ORAL
  Filled 2018-08-19 (×2): qty 1

## 2018-08-19 MED ORDER — ACETAMINOPHEN 500 MG PO TABS
1000.0000 mg | ORAL_TABLET | Freq: Once | ORAL | Status: AC
  Administered 2018-08-19: 1000 mg via ORAL
  Filled 2018-08-19: qty 2

## 2018-08-19 MED ORDER — BISACODYL 5 MG PO TBEC: 5.0000 mg | DELAYED_RELEASE_TABLET | Freq: Every day | ORAL | Status: DC | PRN

## 2018-08-19 MED ORDER — ASPIRIN 81 MG PO CHEW
81.0000 mg | CHEWABLE_TABLET | Freq: Every day | ORAL | Status: DC
  Administered 2018-08-19 – 2018-08-21 (×3): 81 mg via ORAL
  Filled 2018-08-19 (×3): qty 1

## 2018-08-19 MED ORDER — PANTOPRAZOLE SODIUM 40 MG PO TBEC
40.0000 mg | DELAYED_RELEASE_TABLET | Freq: Every day | ORAL | Status: DC
  Administered 2018-08-20 – 2018-08-21 (×2): 40 mg via ORAL
  Filled 2018-08-19 (×2): qty 1

## 2018-08-19 MED ORDER — HYDROXYZINE HCL 10 MG PO TABS
10.0000 mg | ORAL_TABLET | Freq: Three times a day (TID) | ORAL | Status: DC | PRN
  Filled 2018-08-19: qty 1

## 2018-08-19 MED ORDER — STROKE: EARLY STAGES OF RECOVERY BOOK
Freq: Once | Status: AC
  Administered 2018-08-19: 22:00:00

## 2018-08-19 MED ORDER — ONDANSETRON HCL 4 MG/2ML IJ SOLN: 4.0000 mg | Freq: Four times a day (QID) | INTRAMUSCULAR | Status: DC | PRN

## 2018-08-19 MED ORDER — IOHEXOL 300 MG/ML  SOLN
75.0000 mL | Freq: Once | INTRAMUSCULAR | Status: AC | PRN
  Administered 2018-08-19: 75 mL via INTRAVENOUS

## 2018-08-19 MED ORDER — GABAPENTIN 300 MG PO CAPS
600.0000 mg | ORAL_CAPSULE | Freq: Two times a day (BID) | ORAL | Status: DC
  Administered 2018-08-20 – 2018-08-21 (×2): 600 mg via ORAL
  Filled 2018-08-19 (×2): qty 2

## 2018-08-19 MED ORDER — DULOXETINE HCL 30 MG PO CPEP
60.0000 mg | ORAL_CAPSULE | Freq: Every day | ORAL | Status: DC
  Administered 2018-08-20 – 2018-08-21 (×2): 60 mg via ORAL
  Filled 2018-08-19 (×2): qty 2

## 2018-08-19 MED ORDER — ACETAMINOPHEN 325 MG PO TABS
650.0000 mg | ORAL_TABLET | ORAL | Status: DC | PRN
  Administered 2018-08-20: 03:00:00 650 mg via ORAL
  Filled 2018-08-19: qty 2

## 2018-08-19 MED ORDER — TRAZODONE HCL 50 MG PO TABS
50.0000 mg | ORAL_TABLET | Freq: Every day | ORAL | Status: DC
  Administered 2018-08-20: 22:00:00 50 mg via ORAL
  Filled 2018-08-19: qty 1

## 2018-08-19 MED ORDER — GADOBUTROL 1 MMOL/ML IV SOLN
8.0000 mL | Freq: Once | INTRAVENOUS | Status: AC | PRN
  Administered 2018-08-19: 8 mL via INTRAVENOUS

## 2018-08-19 MED ORDER — VITAMIN B-12 1000 MCG PO TABS
1000.0000 ug | ORAL_TABLET | Freq: Every day | ORAL | Status: DC
  Administered 2018-08-20 – 2018-08-21 (×2): 1000 ug via ORAL
  Filled 2018-08-19 (×2): qty 1

## 2018-08-19 MED ORDER — LOSARTAN POTASSIUM 50 MG PO TABS: 100.0000 mg | ORAL_TABLET | Freq: Every day | ORAL | Status: DC

## 2018-08-19 MED ORDER — LACTATED RINGERS IV SOLN
INTRAVENOUS | Status: AC
  Administered 2018-08-20: via INTRAVENOUS

## 2018-08-19 MED ORDER — INSULIN ASPART 100 UNIT/ML ~~LOC~~ SOLN
0.0000 [IU] | Freq: Three times a day (TID) | SUBCUTANEOUS | Status: DC
  Administered 2018-08-20: 18:00:00 2 [IU] via SUBCUTANEOUS
  Administered 2018-08-20: 12:00:00 3 [IU] via SUBCUTANEOUS
  Administered 2018-08-20: 08:00:00 2 [IU] via SUBCUTANEOUS
  Administered 2018-08-21: 08:00:00 3 [IU] via SUBCUTANEOUS
  Filled 2018-08-19 (×3): qty 1

## 2018-08-19 MED ORDER — INSULIN ASPART 100 UNIT/ML ~~LOC~~ SOLN: 0.0000 [IU] | Freq: Every day | SUBCUTANEOUS | Status: DC

## 2018-08-19 MED ORDER — PROCHLORPERAZINE EDISYLATE 10 MG/2ML IJ SOLN
10.0000 mg | Freq: Once | INTRAMUSCULAR | Status: AC
  Administered 2018-08-19: 10 mg via INTRAVENOUS
  Filled 2018-08-19: qty 2

## 2018-08-19 MED ORDER — ARIPIPRAZOLE 2 MG PO TABS
2.0000 mg | ORAL_TABLET | Freq: Every day | ORAL | Status: DC
  Administered 2018-08-20 – 2018-08-21 (×2): 2 mg via ORAL
  Filled 2018-08-19 (×2): qty 1

## 2018-08-19 MED ORDER — LOSARTAN POTASSIUM-HCTZ 100-12.5 MG PO TABS: 1.0000 | ORAL_TABLET | Freq: Every day | ORAL | Status: DC

## 2018-08-19 MED ORDER — HYDROCHLOROTHIAZIDE 12.5 MG PO CAPS: 12.5000 mg | ORAL_CAPSULE | Freq: Every day | ORAL | Status: DC

## 2018-08-19 MED ORDER — LOSARTAN POTASSIUM 50 MG PO TABS
100.0000 mg | ORAL_TABLET | Freq: Every day | ORAL | Status: DC
  Administered 2018-08-20: 100 mg via ORAL
  Filled 2018-08-19 (×2): qty 2

## 2018-08-19 MED ORDER — ZOLPIDEM TARTRATE 5 MG PO TABS: 5.0000 mg | ORAL_TABLET | Freq: Every evening | ORAL | Status: DC | PRN

## 2018-08-19 MED ORDER — ACETAMINOPHEN 650 MG RE SUPP: 650.0000 mg | RECTAL | Status: DC | PRN

## 2018-08-19 MED ORDER — HYDROCHLOROTHIAZIDE 12.5 MG PO CAPS
12.5000 mg | ORAL_CAPSULE | Freq: Every day | ORAL | Status: DC
  Administered 2018-08-20: 12.5 mg via ORAL
  Filled 2018-08-19 (×2): qty 1

## 2018-08-19 MED ORDER — ENOXAPARIN SODIUM 40 MG/0.4ML ~~LOC~~ SOLN
40.0000 mg | SUBCUTANEOUS | Status: DC
  Administered 2018-08-20 (×2): 40 mg via SUBCUTANEOUS
  Filled 2018-08-19: qty 0.4

## 2018-08-19 MED ORDER — SENNOSIDES-DOCUSATE SODIUM 8.6-50 MG PO TABS: 1.0000 | ORAL_TABLET | Freq: Every evening | ORAL | Status: DC | PRN

## 2018-08-19 MED ORDER — ACETAMINOPHEN 160 MG/5ML PO SOLN
650.0000 mg | ORAL | Status: DC | PRN
  Filled 2018-08-19: qty 20.3

## 2018-08-19 MED ORDER — CARVEDILOL 25 MG PO TABS
25.0000 mg | ORAL_TABLET | Freq: Two times a day (BID) | ORAL | Status: DC
  Administered 2018-08-20 – 2018-08-21 (×3): 25 mg via ORAL
  Filled 2018-08-19 (×3): qty 1

## 2018-08-19 NOTE — ED Provider Notes (Signed)
[facility] Emergency Department Provider Note    First MD Initiated Contact with Patient 08/19/18 1629     (approximate)  I have reviewed the triage vital signs and the nursing notes.   HISTORY  Chief Complaint Headache    HPI Jon [name] is a 68 y.o. male below listed past medical history presents the ER for several weeks of headache.  States he is currently having severe right-sided headache throbbing in nature starting from the back of his head to the right eye.  States is similar to previous headaches.  Was not sudden onset.  Denies any fevers.  Does have some nausea associate with photophobia.  Has been taking Fioricet and ibuprofen at home without any improvement.  Denies any numbness or tingling.  Patient does appear uncomfortable.    Past Medical History:  Diagnosis Date  . Diabetes mellitus without complication ([place])   . Hypertension   . Pituitary tumor   . TIA (transient ischemic attack)    History reviewed. No pertinent family history. Past Surgical History:  Procedure Laterality Date  . COLONOSCOPY    . EYE SURGERY    . HERNIA REPAIR     Patient Active Problem List   Diagnosis Date Noted  . 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 Left-sided weakness 06/02/2018  . HTN (hypertension) 06/02/2018  . Diabetes ([place]) 06/02/2018  . GERD (gastroesophageal reflux disease) 06/02/2018  . Headache 07/30/2017      Prior to Admission medications   Medication Sig Start Date End Date Taking? Authorizing Provider  ARIPiprazole (ABILIFY) 2 MG tablet Take 2 mg by mouth daily.   Yes [provider]  carvedilol (COREG) 25 MG tablet Take 25 mg by mouth 2 (two) times daily with a meal.   Yes [provider]  Cholecalciferol (D3-1000) 1000 units tablet Take 1,000 Units by mouth daily.   Yes [provider]  DULoxetine (CYMBALTA) 60 MG capsule Take 60 mg by mouth daily.   Yes [provider]  gabapentin (NEURONTIN)  300 MG capsule Take 600 mg by mouth 2 (two) times daily.    Yes [provider]  glimepiride (AMARYL) 2 MG tablet Take 2 mg by mouth 2 (two) times daily.  06/20/17  Yes [provider]  hydrOXYzine (ATARAX/VISTARIL) 10 MG tablet Take 1 tablet by mouth 3 (three) times daily as needed for itching.  07/01/17  Yes [provider]  ibuprofen (ADVIL,MOTRIN) 200 MG tablet Take 200 mg by mouth every 6 (six) hours as needed for mild pain.   Yes [provider]  losartan-hydrochlorothiazide (HYZAAR) 100-12.5 MG tablet Take 1 tablet by mouth daily.   Yes [provider]  pantoprazole (PROTONIX) 40 MG tablet Take 40 mg by mouth daily. 06/27/17  Yes [provider]  testosterone cypionate (DEPOTESTOSTERONE CYPIONATE) 200 MG/ML injection Inject 200 mg into the muscle every 14 (fourteen) days. 07/29/17  Yes [provider]  traZODone (DESYREL) 50 MG tablet Take 50 mg by mouth at bedtime.    Yes [provider]  vitamin B-12 (CYANOCOBALAMIN) 1000 MCG tablet Take 1,000 mcg by mouth daily.   Yes [provider]    Allergies Beta adrenergic blockers; Topiramate; Aspirin; Benadryl [diphenhydramine]; Lisinopril; Nabumetone; and Valproic acid    Social History Social History   Tobacco Use  . Smoking status: Never Smoker  . Smokeless tobacco: Never Used  Substance Use Topics  . Alcohol use: Never    Frequency: Never  . Drug use: Never  Review of Systems Patient denies headaches, rhinorrhea, blurry vision, numbness, shortness of breath, chest pain, edema, cough, abdominal pain, nausea, vomiting, diarrhea, dysuria, fevers, rashes or hallucinations unless otherwise stated above in HPI. ____________________________________________   PHYSICAL EXAM:  VITAL SIGNS: Vitals:   08/19/18 2100 08/19/18 2300  BP: (!) 144/101 120/74  Pulse:  72  Resp: 19 17  Temp:  98.5 F (36.9 C)  SpO2:  96%    Constitutional: Alert and  oriented.  Eyes: Conjunctivae are normal.  Head: Atraumatic. Nose: No congestion/rhinnorhea. Mouth/Throat: Mucous membranes are moist.   Neck: No stridor. Painless ROM.  Cardiovascular: Normal rate, regular rhythm. Grossly normal heart sounds.  Good peripheral circulation. Respiratory: Normal respiratory effort.  No retractions. Lungs CTAB. Gastrointestinal: Soft and nontender. No distention. No abdominal bruits. No CVA tenderness. Genitourinary:  Musculoskeletal: No lower extremity tenderness nor edema.  No joint effusions. Neurologic:  Normal speech and language. Left hemianopsia,  Normal motor.  No sensory deficits, no facial droop Skin:  Skin is warm, dry and intact. No rash noted. Psychiatric: Mood and affect are normal. Speech and behavior are normal.  ____________________________________________   LABS (all labs ordered are listed, but only abnormal results are displayed)  Results for orders placed or performed during the hospital encounter of 08/19/18 (from the past 24 hour(s))  Ethanol     Status: None   Collection Time: 08/19/18  4:44 PM  Result Value Ref Range   Alcohol, Ethyl (B) <10 <10 mg/dL  Protime-INR     Status: None   Collection Time: 08/19/18  4:44 PM  Result Value Ref Range   Prothrombin Time 12.2 11.4 - 15.2 seconds   INR 0.9 0.8 - 1.2  APTT     Status: None   Collection Time: 08/19/18  4:44 PM  Result Value Ref Range   aPTT 33 24 - 36 seconds  CBC     Status: None   Collection Time: 08/19/18  4:44 PM  Result Value Ref Range   WBC 4.6 4.0 - 10.5 K/uL   RBC 5.13 4.22 - 5.81 MIL/uL   Hemoglobin 15.7 13.0 - 17.0 g/dL   HCT 46.2 39.0 - 52.0 %   MCV 90.1 80.0 - 100.0 fL   MCH 30.6 26.0 - 34.0 pg   MCHC 34.0 30.0 - 36.0 g/dL   RDW 12.4 11.5 - 15.5 %   Platelets 181 150 - 400 K/uL   nRBC 0.0 0.0 - 0.2 %  Differential     Status: None   Collection Time: 08/19/18  4:44 PM  Result Value Ref Range   Neutrophils Relative % 55 %   Neutro Abs 2.5 1.7 - 7.7  K/uL   Lymphocytes Relative 32 %   Lymphs Abs 1.5 0.7 - 4.0 K/uL   Monocytes Relative 9 %   Monocytes Absolute 0.4 0.1 - 1.0 K/uL   Eosinophils Relative 3 %   Eosinophils Absolute 0.2 0.0 - 0.5 K/uL   Basophils Relative 0 %   Basophils Absolute 0.0 0.0 - 0.1 K/uL   Immature Granulocytes 1 %   Abs Immature Granulocytes 0.03 0.00 - 0.07 K/uL  Comprehensive metabolic panel     Status: Abnormal   Collection Time: 08/19/18  4:44 PM  Result Value Ref Range   Sodium 137 135 - 145 mmol/L   Potassium 4.1 3.5 - 5.1 mmol/L   Chloride 101 98 - 111 mmol/L   CO2 24 22 - 32 mmol/L   Glucose, Bld 191 (H) 70 - 99 mg/dL  BUN 11 8 - 23 mg/dL   Creatinine, Ser 0.93 0.61 - 1.24 mg/dL   Calcium 9.1 8.9 - 10.3 mg/dL   Total Protein 7.1 6.5 - 8.1 g/dL   Albumin 4.6 3.5 - 5.0 g/dL   AST 28 15 - 41 U/L   ALT 29 0 - 44 U/L   Alkaline Phosphatase 57 38 - 126 U/L   Total Bilirubin 0.5 0.3 - 1.2 mg/dL   GFR calc non Af Amer >60 >60 mL/min   GFR calc Af Amer >60 >60 mL/min   Anion gap 12 5 - 15  Magnesium     Status: None   Collection Time: 08/19/18  4:44 PM  Result Value Ref Range   Magnesium 2.4 1.7 - 2.4 mg/dL  Phosphorus     Status: Abnormal   Collection Time: 08/19/18  4:44 PM  Result Value Ref Range   Phosphorus 2.4 (L) 2.5 - 4.6 mg/dL  Urine Drug Screen, Qualitative     Status: Abnormal   Collection Time: 08/19/18  5:04 PM  Result Value Ref Range   Tricyclic, Ur Screen POSITIVE (A) NONE DETECTED   Amphetamines, Ur Screen NONE DETECTED NONE DETECTED   MDMA (Ecstasy)Ur Screen NONE DETECTED NONE DETECTED   Cocaine Metabolite,Ur [place] NONE DETECTED NONE DETECTED   Opiate, Ur Screen NONE DETECTED NONE DETECTED   Phencyclidine (PCP) Ur S NONE DETECTED NONE DETECTED   Cannabinoid 50 Ng, Ur [place] NONE DETECTED NONE DETECTED   Barbiturates, Ur Screen POSITIVE (A) NONE DETECTED   Benzodiazepine, Ur Scrn NONE DETECTED NONE DETECTED   Methadone Scn, Ur NONE DETECTED NONE DETECTED  Urinalysis, Routine w  reflex microscopic     Status: Abnormal   Collection Time: 08/19/18  5:04 PM  Result Value Ref Range   Color, Urine YELLOW (A) YELLOW   APPearance HAZY (A) CLEAR   Specific Gravity, Urine 1.016 1.005 - 1.030   pH 6.0 5.0 - 8.0   Glucose, UA 50 (A) NEGATIVE mg/dL   Hgb urine dipstick NEGATIVE NEGATIVE   Bilirubin Urine NEGATIVE NEGATIVE   Ketones, ur NEGATIVE NEGATIVE mg/dL   Protein, ur NEGATIVE NEGATIVE mg/dL   Nitrite NEGATIVE NEGATIVE   Leukocytes,Ua NEGATIVE NEGATIVE  Glucose, capillary     Status: Abnormal   Collection Time: 08/19/18 10:14 PM  Result Value Ref Range   Glucose-Capillary 174 (H) 70 - 99 mg/dL   ____________________________________________  EKG My review and personal interpretation at Time: 16:58   Indication: cva  Rate: 65  Rhythm: sinus Axis: normal Other: normal intervals, no stemi ____________________________________________  RADIOLOGY  I personally reviewed all radiographic images ordered to evaluate for the above acute complaints and reviewed radiology reports and findings.  These findings were personally discussed with the patient.  Please see medical record for radiology report.  ____________________________________________   PROCEDURES  Procedure(s) performed:  Procedures    Critical Care performed: no ____________________________________________   INITIAL IMPRESSION / ASSESSMENT AND PLAN / ED COURSE  Pertinent labs & imaging results that were available during my care of the patient were reviewed by me and considered in my medical decision making (see chart for details).   DDX: cva, tia, hypoglycemia, dehydration, electrolyte abnormality, dissection, sepsis   Jon [name] is a 68 y.o. who presents to the ED with was as described above.  Symptoms concerning for above differential.  CT imaging shows evidence of acute occipital stroke which would correlate the patient's symptoms.  Blood work is reassuring.  Patient given migraine  cocktail with  improvement in symptoms but with persistent neuro deficit as would be anticipated.  Not a candidate for code stroke or TPA based on onset of symptoms being neuro 24 hours.  Not consistent with LVO.  Patient stable for admission the hospital for further medical management.     The patient was evaluated in Emergency Department today for the symptoms described in the history of present illness. He/she was evaluated in the context of the global COVID-19 pandemic, which necessitated consideration that the patient might be at risk for infection with the SARS-CoV-2 virus that causes COVID-19. Institutional protocols and algorithms that pertain to the evaluation of patients at risk for COVID-19 are in a state of rapid change based on information released by regulatory bodies including the CDC and federal and state organizations. These policies and algorithms were followed during the patient's care in the ED.   As part of my medical decision making, I reviewed the following data within the [place] notes reviewed and incorporated, Labs reviewed, notes from prior ED visits and [place] Controlled Substance Database   ____________________________________________   FINAL CLINICAL IMPRESSION(S) / ED DIAGNOSES  Final diagnoses:  Occipital stroke ([place])      NEW MEDICATIONS STARTED DURING THIS VISIT:  Current Discharge Medication List       Note:  This document was prepared using Dragon voice recognition software and may include unintentional dictation errors.    Merlyn Lot, MD 08/20/18 [phone]

## 2018-08-19 NOTE — ED Notes (Signed)
Swallow screen completed at 1630.  Documented out of sequence.

## 2018-08-19 NOTE — ED Notes (Signed)
John RN, aware of bed assigned

## 2018-08-19 NOTE — ED Notes (Signed)
ED TO INPATIENT HANDOFF REPORT  ED Nurse Name and Phone #: Gwynn Burly Name/Age/Gender Jon [name] 68 y.o. male Room/Bed: ED16A/ED16A  Code Status   Code Status: Prior  Home/SNF/Other Home Patient oriented to: self, place, time and situation Is this baseline? Yes   Triage Complete: Triage complete  Chief Complaint Weakness  Triage Note Wife in for triage because pt is mumbling per first RN.  Pt states HA x weeks. R eye pain that began "same time the headache started" today told wife that he couldn't see out of both eyes that began at 7am today. Speech slow. States R eye is okay and he can "only see the last of it", talking about a sentence on the tv but states couldn't see anything out of L eye. Won't specify if its blurred or black. Hx  Cataracts surgery. Wife states scar tissue from that so pt states he wears bifocals. Denies blood thinner use. 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   Wife is Jon [name] [phone] cell phone   Allergies Allergies  Allergen Reactions  . Beta Adrenergic Blockers Itching  . Topiramate Nausea And Vomiting    Other reaction(s): Dizziness, Hallucination Other Reaction: violence  . Aspirin Other (See Comments)    Per pt's PCP due to spontaneous bleeding in his brain.  . Benadryl [Diphenhydramine] Hives  . Lisinopril Itching    Other reaction(s): Other (See Comments) Leg pain  . Nabumetone Other (See Comments)    Reaction: unknown  . Valproic Acid Other (See Comments)    Reaction: Headache    Level of Care/Admitting Diagnosis ED Disposition    ED Disposition Condition Comment   Admit  Hospital Area: [facility] [[id number]]  Level of Care: Med-Surg [16]  Diagnosis: Occipital stroke [facility]) [[id number]]  Admitting Physician: Arta Silence [[id number]]  Attending Physician: Arta Silence [[id number]]  Estimated length of stay:  past midnight tomorrow  Certification:: I certify this patient will need inpatient services for at least 2 midnights  PT Class (Do Not Modify): Inpatient [101]  PT Acc Code (Do Not Modify): Private [1]       B Medical/Surgery History Past Medical History:  Diagnosis Date  . Diabetes mellitus without complication ([place])   . Hypertension   . Pituitary tumor   . TIA (transient ischemic attack)    Past Surgical History:  Procedure Laterality Date  . COLONOSCOPY    . EYE SURGERY    . HERNIA REPAIR       A IV Location/Drains/Wounds Patient Lines/Drains/Airways Status   Active Line/Drains/Airways    Name:   Placement date:   Placement time:   Site:   Days:   Peripheral IV 08/19/18 Right Forearm   08/19/18    1732    Forearm   less than 1          Intake/Output Last 24 hours No intake or output data in the 24 hours ending 08/19/18 2055  Labs/Imaging Results for orders placed or performed during the hospital encounter of 08/19/18 (from the past 48 hour(s))  Ethanol     Status: None   Collection Time: 08/19/18  4:44 PM  Result Value Ref Range   Alcohol, Ethyl (B) <10 <10 mg/dL    Comment: (NOTE) Lowest detectable limit for serum alcohol is 10 mg/dL. For medical purposes only. Performed at [facility], [address] [place], [place] [address]  Protime-INR     Status: None   Collection Time: 08/19/18  4:44 PM  Result Value Ref Range   Prothrombin Time 12.2 11.4 - 15.2 seconds   INR 0.9 0.8 - 1.2    Comment: (NOTE) INR goal varies based on device and disease states. Performed at [facility], [address] [place], [place] [address]   APTT     Status: None   Collection Time: 08/19/18  4:44 PM  Result Value Ref Range   aPTT 33 24 - 36 seconds    Comment: Performed at [facility], [address] [place], [place] [address]  CBC     Status: None   Collection Time: 08/19/18  4:44 PM  Result Value Ref Range   WBC 4.6 4.0 - 10.5  K/uL   RBC 5.13 4.22 - 5.81 MIL/uL   Hemoglobin 15.7 13.0 - 17.0 g/dL   HCT 46.2 39.0 - 52.0 %   MCV 90.1 80.0 - 100.0 fL   MCH 30.6 26.0 - 34.0 pg   MCHC 34.0 30.0 - 36.0 g/dL   RDW 12.4 11.5 - 15.5 %   Platelets 181 150 - 400 K/uL   nRBC 0.0 0.0 - 0.2 %    Comment: Performed at [facility], [address] [place], [place] [address]  Differential     Status: None   Collection Time: 08/19/18  4:44 PM  Result Value Ref Range   Neutrophils Relative % 55 %   Neutro Abs 2.5 1.7 - 7.7 K/uL   Lymphocytes Relative 32 %   Lymphs Abs 1.5 0.7 - 4.0 K/uL   Monocytes Relative 9 %   Monocytes Absolute 0.4 0.1 - 1.0 K/uL   Eosinophils Relative 3 %   Eosinophils Absolute 0.2 0.0 - 0.5 K/uL   Basophils Relative 0 %   Basophils Absolute 0.0 0.0 - 0.1 K/uL   Immature Granulocytes 1 %   Abs Immature Granulocytes 0.03 0.00 - 0.07 K/uL    Comment: Performed at [facility], [address] [place], [place] [address]  Comprehensive metabolic panel     Status: Abnormal   Collection Time: 08/19/18  4:44 PM  Result Value Ref Range   Sodium 137 135 - 145 mmol/L   Potassium 4.1 3.5 - 5.1 mmol/L   Chloride 101 98 - 111 mmol/L   CO2 24 22 - 32 mmol/L   Glucose, Bld 191 (H) 70 - 99 mg/dL   BUN 11 8 - 23 mg/dL   Creatinine, Ser 0.93 0.61 - 1.24 mg/dL   Calcium 9.1 8.9 - 10.3 mg/dL   Total Protein 7.1 6.5 - 8.1 g/dL   Albumin 4.6 3.5 - 5.0 g/dL   AST 28 15 - 41 U/L   ALT 29 0 - 44 U/L   Alkaline Phosphatase 57 38 - 126 U/L   Total Bilirubin 0.5 0.3 - 1.2 mg/dL   GFR calc non Af Amer >60 >60 mL/min   GFR calc Af Amer >60 >60 mL/min   Anion gap 12 5 - 15    Comment: Performed at [facility], [address] [place], [place] [address]  Urine Drug Screen, Qualitative     Status: Abnormal   Collection Time: 08/19/18  5:04 PM  Result Value Ref Range   Tricyclic, Ur Screen POSITIVE (A) NONE DETECTED   Amphetamines, Ur Screen NONE DETECTED NONE DETECTED   MDMA  (Ecstasy)Ur Screen NONE DETECTED NONE DETECTED   Cocaine Metabolite,Ur [place] NONE DETECTED NONE DETECTED  Opiate, Ur Screen NONE DETECTED NONE DETECTED   Phencyclidine (PCP) Ur S NONE DETECTED NONE DETECTED   Cannabinoid 50 Ng, Ur [place] NONE DETECTED NONE DETECTED   Barbiturates, Ur Screen POSITIVE (A) NONE DETECTED   Benzodiazepine, Ur Scrn NONE DETECTED NONE DETECTED   Methadone Scn, Ur NONE DETECTED NONE DETECTED    Comment: (NOTE) Tricyclics + metabolites, urine    Cutoff 1000 ng/mL Amphetamines + metabolites, urine  Cutoff 1000 ng/mL MDMA (Ecstasy), urine              Cutoff 500 ng/mL Cocaine Metabolite, urine          Cutoff 300 ng/mL Opiate + metabolites, urine        Cutoff 300 ng/mL Phencyclidine (PCP), urine         Cutoff 25 ng/mL Cannabinoid, urine                 Cutoff 50 ng/mL Barbiturates + metabolites, urine  Cutoff 200 ng/mL Benzodiazepine, urine              Cutoff 200 ng/mL Methadone, urine                   Cutoff 300 ng/mL The urine drug screen provides only a preliminary, unconfirmed analytical test result and should not be used for non-medical purposes. Clinical consideration and professional judgment should be applied to any positive drug screen result due to possible interfering substances. A more specific alternate chemical method must be used in order to obtain a confirmed analytical result. Gas chromatography / mass spectrometry (GC/MS) is the preferred confirmat ory method. Performed at [facility], [address] [place], [place] [address]   Urinalysis, Routine w reflex microscopic     Status: Abnormal   Collection Time: 08/19/18  5:04 PM  Result Value Ref Range   Color, Urine YELLOW (A) YELLOW   APPearance HAZY (A) CLEAR   Specific Gravity, Urine 1.016 1.005 - 1.030   pH 6.0 5.0 - 8.0   Glucose, UA 50 (A) NEGATIVE mg/dL   Hgb urine dipstick NEGATIVE NEGATIVE   Bilirubin Urine NEGATIVE NEGATIVE   Ketones, ur NEGATIVE NEGATIVE mg/dL    Protein, ur NEGATIVE NEGATIVE mg/dL   Nitrite NEGATIVE NEGATIVE   Leukocytes,Ua NEGATIVE NEGATIVE    Comment: Performed at [facility], [address] [place], [place] [address]   Ct Head W Or Wo Contrast  Result Date: 08/19/2018 CLINICAL DATA:  Headache for weeks with right eye pain. Bilateral visual loss today. Slowed speech. EXAM: CT HEAD WITHOUT AND WITH CONTRAST TECHNIQUE: Contiguous axial images were obtained from the base of the skull through the vertex without and with intravenous contrast CONTRAST:  56mL OMNIPAQUE IOHEXOL 300 MG/ML  SOLN COMPARISON:  Head CT 06/02/2018 and MRI 06/03/2018 FINDINGS: Brain: A new small wedge-shaped hypodensity posteriorly in the right occipital lobe is consistent with an acute infarct. There is no evidence of acute intracranial hemorrhage, midline shift, or extra-axial fluid collection. Cerebral white matter hypodensities are unchanged and nonspecific but compatible with mild chronic small vessel ischemic disease. 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 The ventricles and sulci are within normal limits for age. Post-contrast imaging again demonstrates an AVM in the left temporal lobe with 1.5 cm enhancing nidus and prominent draining vein coursing anteriorly eventually into the anterior aspect of this superior sagittal sinus. Left-sided pituitary mass is more conspicuous on MRI. Vascular: Calcified atherosclerosis at the skull base. Major dural venous  sinuses and large arteries at the base of the brain are grossly patent. Skull: No fracture or focal osseous lesion. Sinuses/Orbits: Visualized paranasal sinuses and mastoid air cells are clear. Bilateral cataract extraction is noted. Other: None. IMPRESSION: 1. Small acute right occipital lobe infarct. 2. Mild chronic small vessel ischemic disease. 3. Known left temporal lobe AVM.  No acute hemorrhage. Electronically Signed   By: Logan Bores M.D.   On: 08/19/2018 18:18     Pending Labs [employer] (From admission, onward)    Start     Ordered   Signed and Held  Hemoglobin A1c  Tomorrow morning,   R     Signed and Held   Signed and Held  Lipid panel  Tomorrow morning,   R    Comments:  Fasting    Signed and Held          Vitals/Pain Today's Vitals   08/19/18 1945 08/19/18 2000 08/19/18 2015 08/19/18 2030  BP:  128/90  (!) 148/88  Pulse:      Resp: 12 17 11 16   Temp:      TempSrc:      SpO2:      Weight:      Height:      PainSc:        Isolation Precautions No active isolations  Medications Medications  aspirin chewable tablet 81 mg (81 mg Oral Given 08/19/18 2053)  lactated ringers infusion (has no administration in time range)  prochlorperazine (COMPAZINE) injection 10 mg (10 mg Intravenous Given 08/19/18 1648)  acetaminophen (TYLENOL) tablet 1,000 mg (1,000 mg Oral Given 08/19/18 1647)  iohexol (OMNIPAQUE) 300 MG/ML solution 75 mL (75 mLs Intravenous Contrast Given 08/19/18 1740)    Mobility walks Moderate fall risk   Focused Assessments Neuro Assessment Handoff:  Swallow screen pass? Yes    NIH Stroke Scale ( + Modified Stroke Scale Criteria)  LOC Questions (1b. )   +: Answers both questions correctly LOC Commands (1c. )   + : Performs both tasks correctly Best Gaze (2. )  +: Normal Visual (3. )  +: No visual loss Motor Arm, Left (5a. )   +: No drift Motor Arm, Right (5b. )   +: No drift Motor Leg, Left (6a. )   +: Drift Motor Leg, Right (6b. )   +: Drift Sensory (8. )   +: Normal, no sensory loss Best Language (9. )   +: No aphasia Extinction/Inattention (11.)   +: No Abnormality Modified SS Total  +: 2     Neuro Assessment:   Neuro Checks:      Last Documented NIHSS Modified Score: 2 (08/19/18 1820) Has TPA been given? No If patient is a Neuro Trauma and patient is going to OR before floor call report to [place] nurse: [phone] or [phone]     R Recommendations: See Admitting Provider  Note  Report given to:   Additional Notes: none

## 2018-08-19 NOTE — ED Triage Notes (Addendum)
Wife in for triage because pt is mumbling per first RN.  Pt states HA x weeks. R eye pain that began "same time the headache started" today told wife that he couldn't see out of both eyes that began at 7am today. Speech slow. States R eye is okay and he can "only see the last of it", talking about a sentence on the tv but states couldn't see anything out of L eye. Won't specify if its blurred or black. Hx  Cataracts surgery. Wife states scar tissue from that so pt states he wears bifocals. Denies blood thinner use. 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   Wife is [name] [phone] cell phone

## 2018-08-20 ENCOUNTER — Inpatient Hospital Stay: Admit: 2018-08-20 | Payer: Medicare Other

## 2018-08-20 ENCOUNTER — Inpatient Hospital Stay (HOSPITAL_COMMUNITY)
Admit: 2018-08-20 | Discharge: 2018-08-20 | Disposition: A | Discharge status: Home / Self Care | Payer: Medicare Other | Attending: Neurology | Admitting: Neurology

## 2018-08-20 DIAGNOSIS — I340 Nonrheumatic mitral (valve) insufficiency: Secondary | ICD-10-CM

## 2018-08-20 DIAGNOSIS — I639 Cerebral infarction, unspecified: Secondary | ICD-10-CM

## 2018-08-20 LAB — LIPID PANEL
Cholesterol: 220 mg/dL — ABNORMAL HIGH (ref 0–200)
HDL: 27 mg/dL — ABNORMAL LOW (ref >40)
LDL Cholesterol: 114 mg/dL — ABNORMAL HIGH (ref 0–99)
Total CHOL/HDL Ratio: 8.1 RATIO
Triglycerides: 394 mg/dL — ABNORMAL HIGH (ref <150)
VLDL: 79 mg/dL — ABNORMAL HIGH (ref 0–40)

## 2018-08-20 LAB — GLUCOSE, CAPILLARY
Glucose-Capillary: 141 mg/dL — ABNORMAL HIGH (ref 70–99)
Glucose-Capillary: 155 mg/dL — ABNORMAL HIGH (ref 70–99)
Glucose-Capillary: 140 mg/dL — ABNORMAL HIGH (ref 70–99)
Glucose-Capillary: 162 mg/dL — ABNORMAL HIGH (ref 70–99)

## 2018-08-20 MED ORDER — ATORVASTATIN CALCIUM 20 MG PO TABS
40.0000 mg | ORAL_TABLET | Freq: Every day | ORAL | Status: DC
  Administered 2018-08-20: 40 mg via ORAL
  Filled 2018-08-20: qty 2

## 2018-08-20 MED ORDER — BUTALBITAL-APAP-CAFFEINE 50-325-40 MG PO TABS
1.0000 | ORAL_TABLET | ORAL | Status: DC | PRN
  Administered 2018-08-20 – 2018-08-21 (×4): 1 via ORAL
  Filled 2018-08-20 (×7): qty 1

## 2018-08-20 MED ORDER — K PHOS MONO-SOD PHOS DI & MONO 155-852-130 MG PO TABS
500.0000 mg | ORAL_TABLET | Freq: Two times a day (BID) | ORAL | Status: DC
  Administered 2018-08-20 – 2018-08-21 (×3): 500 mg via ORAL
  Filled 2018-08-20 (×5): qty 2

## 2018-08-20 NOTE — Plan of Care (Signed)
  Problem: Education: Goal: Knowledge of disease or condition will improve Outcome: Progressing Goal: Knowledge of secondary prevention will improve Outcome: Progressing Goal: Knowledge of patient specific risk factors addressed and post discharge goals established will improve Outcome: Progressing   Problem: Self-Care: Goal: Ability to participate in self-care as condition permits will improve Outcome: Progressing   Problem: Pain Managment: Goal: General experience of comfort will improve Outcome: Progressing   Problem: Safety: Goal: Ability to remain free from injury will improve Outcome: Progressing

## 2018-08-20 NOTE — H&P (Signed)
[facility] NAME: Jon [name]    MR#:  [id number]  DATE OF BIRTH:  Oct 04, 1950  DATE OF ADMISSION:  08/19/2018  PRIMARY CARE PHYSICIAN: Idelle Crouch, MD   REQUESTING/REFERRING PHYSICIAN: Merlyn Lot, MD  CHIEF COMPLAINT:   Chief Complaint  Patient presents with   Headache    HISTORY OF PRESENT ILLNESS:  Jon [name]  is a 68 y.o. 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 He describes a bitemporal hemianopsia. He endorses headache x2-3wks, constant, over left temple and behind the eyes. This headache has been worse x2-3d, and he states it became so severe on the date of admission (Sat 08/19/2018) that he "broke down". He states he has been unable to sleep due to pain. He states he woke up on the morning of Saturday 04/11 and turned on the TV. He noticed he could only read the subtitles on the right side of the screen. He called his wife, and she drove him to the ED. He endorses chronic mild L-sided weakness and sensory deficit to crude touch, which he has had since late January 2020; this is at baseline, no worse than usual. Motor, CN, reflex exams WNL. He does not exhibit aphasia or dysarthia. He is rather astute, and tells me that he is concerned his pituitary mass has increased in size. 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." MRA brain demonstrates, "No large vessel occlusion or other acute finding."  PAST MEDICAL HISTORY:   Past Medical History:  Diagnosis Date   Diabetes mellitus without complication ([place])    Hypertension    Pituitary tumor    TIA (transient ischemic attack)      PAST SURGICAL HISTORY:   Past Surgical History:  Procedure Laterality Date   COLONOSCOPY     EYE SURGERY     HERNIA REPAIR      SOCIAL HISTORY:   Social History   Tobacco Use   Smoking status: Never Smoker   Smokeless tobacco: Never Used  Substance Use Topics   Alcohol use: Never    Frequency: Never    FAMILY HISTORY:  History reviewed. No pertinent family history.  DRUG ALLERGIES:   Allergies  Allergen Reactions   Beta Adrenergic Blockers Itching   Topiramate Nausea And Vomiting    Other reaction(s): Dizziness, Hallucination Other Reaction: violence   Aspirin Other (See Comments)    Per pt's PCP due to spontaneous bleeding in his brain.   Benadryl [Diphenhydramine] Hives   Lisinopril Itching    Other reaction(s): Other (See Comments) Leg pain   Nabumetone Other (See Comments)    Reaction: unknown   Valproic Acid Other (See Comments)    Reaction: Headache    REVIEW OF SYSTEMS:   Review of Systems  Constitutional: Negative for chills, diaphoresis, fever, malaise/fatigue and weight loss.  HENT: Negative for congestion, ear pain, hearing loss, nosebleeds, sinus pain, sore throat and tinnitus.   Eyes: Negative for blurred vision and double vision.  Respiratory: Negative for cough, hemoptysis, sputum production, shortness of breath and wheezing.   Cardiovascular: Negative for chest pain, palpitations, orthopnea, claudication, leg swelling and PND.  Gastrointestinal: Negative for abdominal pain, blood in stool, constipation, diarrhea,  heartburn, melena, nausea and vomiting.  Genitourinary: Negative for dysuria, frequency, hematuria and urgency.  Musculoskeletal: Negative for back pain, falls, joint pain, myalgias and neck pain.  Skin: Negative for itching and rash.  Neurological: Positive for headaches. Negative for dizziness, tingling, tremors, sensory change, speech change, focal weakness, seizures, loss of consciousness and weakness.   Psychiatric/Behavioral: Negative for depression and memory loss. The patient has insomnia. The patient is not nervous/anxious.    (+) headache, insomnia, visual disturbance (as per HPI). Endorses mild baseline L-sided weakness/sensory deficits to crude touch since 05/2018; unchanged from prior. MEDICATIONS AT HOME:   Prior to Admission medications   Medication Sig Start Date End Date Taking? Authorizing Provider  ARIPiprazole (ABILIFY) 2 MG tablet Take 2 mg by mouth daily.   Yes [provider]  carvedilol (COREG) 25 MG tablet Take 25 mg by mouth 2 (two) times daily with a meal.   Yes [provider]  Cholecalciferol (D3-1000) 1000 units tablet Take 1,000 Units by mouth daily.   Yes [provider]  DULoxetine (CYMBALTA) 60 MG capsule Take 60 mg by mouth daily.   Yes [provider]  gabapentin (NEURONTIN) 300 MG capsule Take 600 mg by mouth 2 (two) times daily.    Yes [provider]  glimepiride (AMARYL) 2 MG tablet Take 2 mg by mouth 2 (two) times daily.  06/20/17  Yes [provider]  hydrOXYzine (ATARAX/VISTARIL) 10 MG tablet Take 1 tablet by mouth 3 (three) times daily as needed for itching.  07/01/17  Yes [provider]  ibuprofen (ADVIL,MOTRIN) 200 MG tablet Take 200 mg by mouth every 6 (six) hours as needed for mild pain.   Yes [provider]  losartan-hydrochlorothiazide (HYZAAR) 100-12.5 MG tablet Take 1 tablet by mouth daily.   Yes [provider]  pantoprazole (PROTONIX) 40 MG tablet Take 40 mg by mouth daily. 06/27/17  Yes [provider]  testosterone cypionate (DEPOTESTOSTERONE CYPIONATE) 200 MG/ML injection Inject 200 mg into the muscle every 14 (fourteen) days. 07/29/17  Yes [provider]  traZODone (DESYREL) 50 MG tablet Take 50 mg by mouth at bedtime.    Yes [provider]  vitamin B-12 (CYANOCOBALAMIN) 1000 MCG tablet Take 1,000 mcg by mouth daily.   Yes [provider]      VITAL SIGNS:  Blood pressure 120/74, pulse 72, temperature 98.5 F (36.9 C), temperature source Oral, resp. rate 17, height 5\' 6"  (1.676 m), weight 86.6 kg, SpO2 96 %.  PHYSICAL EXAMINATION:  Physical Exam Constitutional:      General: He is not in acute distress.    Appearance: He is not ill-appearing, toxic-appearing or diaphoretic.  HENT:     Head: Atraumatic.     Mouth/Throat:     Mouth: Mucous membranes are moist.     Pharynx: Oropharynx is clear.  Eyes:     General: No scleral icterus.    Extraocular Movements: Extraocular movements intact.     Conjunctiva/sclera: Conjunctivae normal.  Neck:     Musculoskeletal: Neck supple.  Cardiovascular:     Rate and Rhythm: Normal rate and regular rhythm.     Heart sounds: Normal heart sounds. No murmur. No friction rub. No gallop.   Pulmonary:     Effort: No respiratory distress.     Breath sounds: Normal breath sounds. No stridor. No wheezing, rhonchi or rales.  Abdominal:     General: Bowel sounds are normal. There is no distension.     Palpations: Abdomen  is soft.     Tenderness: There is no abdominal tenderness. There is no guarding or rebound.  Musculoskeletal: Normal range of motion.        General: No swelling or tenderness.     Right lower leg: No edema.     Left lower leg: No edema.  Lymphadenopathy:     Cervical: No cervical adenopathy.  Skin:    General: Skin is warm and dry.     Coloration: Skin is not pale.     Findings: No erythema or rash.  Neurological:     Mental Status: He is alert and oriented to person, place, and time.     Cranial Nerves: Cranial nerves are intact.     Sensory: Sensory deficit (Decreases sensation to crude touch LLE (baseline)) present.     Motor: Motor function is intact. No weakness or tremor.     Gait: Gait is intact.     Deep Tendon Reflexes: Reflexes are normal and symmetric.  Psychiatric:        Attention and Perception: Attention normal.        Mood and  Affect: Mood and affect normal.        Speech: Speech normal.        Behavior: Behavior normal. Behavior is cooperative.        Thought Content: Thought content normal.        Cognition and Memory: Cognition and memory normal.        Judgment: Judgment normal.    LABORATORY PANEL:   CBC Recent Labs  Lab 08/19/18 1644  WBC 4.6  HGB 15.7  HCT 46.2  PLT 181   ------------------------------------------------------------------------------------------------------------------  Chemistries  Recent Labs  Lab 08/19/18 1644  NA 137  K 4.1  CL 101  CO2 24  GLUCOSE 191*  BUN 11  CREATININE 0.93  CALCIUM 9.1  MG 2.4  AST 28  ALT 29  ALKPHOS 57  BILITOT 0.5   ------------------------------------------------------------------------------------------------------------------  Cardiac Enzymes No results for input(s): TROPONINI in the last 168 hours. ------------------------------------------------------------------------------------------------------------------  RADIOLOGY:  Ct Head W Or Wo Contrast  Result Date: 08/19/2018 CLINICAL DATA:  Headache for weeks with right eye pain. Bilateral visual loss today. Slowed speech. EXAM: CT HEAD WITHOUT AND WITH CONTRAST TECHNIQUE: Contiguous axial images were obtained from the base of the skull through the vertex without and with intravenous contrast CONTRAST:  21mL OMNIPAQUE IOHEXOL 300 MG/ML  SOLN COMPARISON:  Head CT 06/02/2018 and MRI 06/03/2018 FINDINGS: Brain: A new small wedge-shaped hypodensity posteriorly in the right occipital lobe is consistent with an acute infarct. There is no evidence of acute intracranial hemorrhage, midline shift, or extra-axial fluid collection. Cerebral white matter hypodensities are unchanged and nonspecific but compatible with mild chronic small vessel ischemic disease. 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 The ventricles and sulci are within normal limits for age.  Post-contrast imaging again demonstrates an AVM in the left temporal lobe with 1.5 cm enhancing nidus and prominent draining vein coursing anteriorly eventually into the anterior aspect of this superior sagittal sinus. Left-sided pituitary mass is more conspicuous on MRI. Vascular: Calcified atherosclerosis at the skull base. Major dural venous sinuses and large arteries at the base of the brain are grossly patent. Skull: No fracture or focal osseous lesion. Sinuses/Orbits: Visualized paranasal sinuses and mastoid air cells are clear. Bilateral cataract extraction is noted. Other: None. IMPRESSION: 1. Small acute right occipital lobe infarct. 2. Mild chronic small vessel ischemic disease. 3. Known  left temporal lobe AVM.  No acute hemorrhage. Electronically Signed   By: Logan Bores M.D.   On: 08/19/2018 18:18   Mr Jodene Nam Neck W Wo Contrast  Result Date: 08/20/2018 CLINICAL DATA:  Initial evaluation for acute headache, right eye pain. EXAM: MRI HEAD WITHOUT CONTRAST MRA HEAD WITHOUT CONTRAST MRA NECK WITHOUT AND WITH CONTRAST TECHNIQUE: Multiplanar, multiecho pulse sequences of the brain and surrounding structures were obtained without intravenous contrast. Angiographic images of the Circle of Willis were obtained using MRA technique without intravenous contrast. Angiographic images of the neck were obtained using MRA technique without and with intravenous contrast. Carotid stenosis measurements (when applicable) are obtained utilizing NASCET criteria, using the distal internal carotid diameter as the denominator. CONTRAST:  8 cc of Gadavist. COMPARISON:  Prior CT from prior the same day as well as previous MRI from 06/03/2018 FINDINGS: MRI HEAD FINDINGS Brain: Cerebral volume within normal limits for patient age. Scattered patchy T2/FLAIR hyperintensity within the periventricular and deep white matter both cerebral hemispheres, most consistent with chronic small vessel ischemic disease, mild in nature and stable  from previous. Small chronic left cerebellar infarct noted. Additional chronic lacunar infarct noted at the posterior right periventricular white matter. 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 Few additional scattered chronic micro hemorrhages noted within the bilateral cerebral hemispheres, stable from previous. Findings could be related to amyloid angiopathy and/or prior traumatic brain injury. Patchy small volume acute ischemic nonhemorrhagic infarcts seen at the right occipital lobe (series 5, image 20). Largest area of infarction measures 15 mm. Additional punctate posterior right temporal infarct (series 5, image 18). No associated mass effect. No other areas of acute or subacute infarction. Left temporal AVM again noted. No other mass lesion, midline shift or mass effect. No hydrocephalus. No extra-axial fluid collection. 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 Midline structures intact. Vascular: Major intracranial vascular flow voids maintained Skull and upper cervical spine: Craniocervical junction normal. Visualized upper cervical spine within normal limits. Bone marrow signal intensity normal. No scalp soft tissue abnormality. Sinuses/Orbits: Patient status post bilateral ocular lens replacement. Globes and orbital soft tissues demonstrate no acute finding. Paranasal sinuses are clear. No mastoid effusion. Inner ear structures normal. Paranasal sinuses are clear. No mastoid effusion. Inner ear structures normal. Other: None. MRA HEAD FINDINGS ANTERIOR CIRCULATION: Distal cervical segments of the internal carotid arteries are widely patent with symmetric antegrade flow. Atheromatous irregularity with mild multifocal narrowing at the petrous right ICA, stable. Petrous left ICA widely patent. Cavernous and supraclinoid segments patent without stenosis or other abnormality. A1 segments, anterior communicating artery common anterior cerebral arteries  widely patent. M1 segments well perfused and patent bilaterally. Distal MCA branches well perfused and symmetric. Wispy tangle of vessels with shunting into adjacent cortical vein at the left temporal lobe compatible with known AVM, stable in appearance. Ninety symmetric approximately 15 mm. No associated hemorrhage. POSTERIOR CIRCULATION: Vertebral arteries patent to the vertebrobasilar junction without stenosis. Posterior inferior cerebral arteries patent proximally. Basilar patent to its distal aspect without stenosis. Superior cerebral arteries patent bilaterally. Both posterior cerebral arteries widely patent to their distal aspects without stenosis. MRA NECK FINDINGS Source images reviewed. Patent and symmetric antegrade flow seen within the carotid and vertebral arteries bilaterally on time-of-flight sequence. Visualized aortic arch of normal caliber with normal 3 vessel morphology. No hemodynamically significant stenosis seen about the origin of the great vessels. 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 This is positioned immediately prior to the takeoff of the left vertebral artery. Visualized subclavian arteries otherwise widely patent and normal. Right common carotid artery patent from its origin to the bifurcation without stenosis. No significant atheromatous narrowing about the right bifurcation. Right ICA patent from its origin to the skull base without stenosis, dissection, or occlusion. Left common carotid artery patent from its origin to the bifurcation without stenosis. Short-segment atheromatous stenosis at Chi origin of the left ICA measures up to 40% by NASCET criteria. Left ICA otherwise widely patent distally to the skull base without stenosis or occlusion. Both of the vertebral arteries arise from the subclavian arteries. Mild tortuosity with mild multifocal  narrowing involving the proximal right V2 segment, similar to previous. Vertebral arteries otherwise patent within the neck without stenosis, occlusion, or findings to suggest dissection. IMPRESSION: MRI HEAD IMPRESSION: 1. 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 No associated mass effect. 2. 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 Again, findings can be seen in the setting of amyloid angiopathy or possibly remote traumatic brain injury. 3. 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 MRA HEAD IMPRESSION: 1. No large vessel occlusion or other acute finding. No hemodynamically significant or correctable stenosis. 2. 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 No associated hemorrhage. MRA NECK IMPRESSION: 1. 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 Otherwise wide patency of both carotid artery systems within the neck. 2. Patent vertebral arteries within the neck without hemodynamically significant stenosis. 3. 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 4. No other acute vascular abnormality within the neck. Electronically Signed   By: Jeannine Boga M.D.   On: 08/20/2018 00:44   Mr Brain Wo Contrast  Result Date: 08/20/2018 CLINICAL DATA:  Initial evaluation for acute headache, right eye pain. EXAM: MRI HEAD WITHOUT CONTRAST MRA HEAD WITHOUT CONTRAST MRA NECK WITHOUT AND WITH CONTRAST TECHNIQUE: Multiplanar, multiecho pulse sequences of the brain and surrounding structures were obtained without intravenous contrast. Angiographic images of the Circle of Willis were obtained using MRA technique without intravenous contrast. Angiographic images of the neck were obtained using MRA  technique without and with intravenous contrast. Carotid stenosis measurements (when applicable) are obtained utilizing NASCET criteria, using the distal internal carotid diameter as the denominator. CONTRAST:  8 cc of Gadavist. COMPARISON:  Prior CT from prior the same day as well as previous MRI from 06/03/2018 FINDINGS: MRI HEAD FINDINGS Brain: Cerebral volume within normal limits for patient age. Scattered patchy T2/FLAIR hyperintensity within the periventricular and deep white matter both cerebral hemispheres, most consistent with chronic small vessel ischemic disease, mild in nature and stable from previous. Small chronic left cerebellar infarct noted. Additional chronic lacunar infarct noted at the posterior right periventricular white matter. 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 Few additional scattered chronic micro hemorrhages noted within the bilateral cerebral hemispheres, stable from previous. Findings could be related to amyloid angiopathy and/or prior traumatic brain injury. Patchy small volume acute ischemic nonhemorrhagic infarcts seen at the right occipital lobe (series 5, image 20). Largest area of infarction measures 15 mm. Additional punctate posterior right temporal infarct (series 5, image 18). No associated mass effect. No other areas of acute or subacute infarction. Left temporal AVM  again noted. No other mass lesion, midline shift or mass effect. No hydrocephalus. No extra-axial fluid collection. 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 Midline structures intact. Vascular: Major intracranial vascular flow voids maintained Skull and upper cervical spine: Craniocervical junction normal. Visualized upper cervical spine within normal limits. Bone marrow signal intensity normal. No scalp soft tissue abnormality. Sinuses/Orbits: Patient status post bilateral ocular lens replacement. Globes and orbital soft tissues demonstrate no acute  finding. Paranasal sinuses are clear. No mastoid effusion. Inner ear structures normal. Paranasal sinuses are clear. No mastoid effusion. Inner ear structures normal. Other: None. MRA HEAD FINDINGS ANTERIOR CIRCULATION: Distal cervical segments of the internal carotid arteries are widely patent with symmetric antegrade flow. Atheromatous irregularity with mild multifocal narrowing at the petrous right ICA, stable. Petrous left ICA widely patent. Cavernous and supraclinoid segments patent without stenosis or other abnormality. A1 segments, anterior communicating artery common anterior cerebral arteries widely patent. M1 segments well perfused and patent bilaterally. Distal MCA branches well perfused and symmetric. Wispy tangle of vessels with shunting into adjacent cortical vein at the left temporal lobe compatible with known AVM, stable in appearance. Ninety symmetric approximately 15 mm. No associated hemorrhage. POSTERIOR CIRCULATION: Vertebral arteries patent to the vertebrobasilar junction without stenosis. Posterior inferior cerebral arteries patent proximally. Basilar patent to its distal aspect without stenosis. Superior cerebral arteries patent bilaterally. Both posterior cerebral arteries widely patent to their distal aspects without stenosis. MRA NECK FINDINGS Source images reviewed. Patent and symmetric antegrade flow seen within the carotid and vertebral arteries bilaterally on time-of-flight sequence. Visualized aortic arch of normal caliber with normal 3 vessel morphology. No hemodynamically significant stenosis seen about the origin of the great vessels. 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 This is positioned immediately prior to the takeoff of the left vertebral artery. Visualized subclavian arteries otherwise widely patent and normal. Right common carotid artery  patent from its origin to the bifurcation without stenosis. No significant atheromatous narrowing about the right bifurcation. Right ICA patent from its origin to the skull base without stenosis, dissection, or occlusion. Left common carotid artery patent from its origin to the bifurcation without stenosis. Short-segment atheromatous stenosis at Chi origin of the left ICA measures up to 40% by NASCET criteria. Left ICA otherwise widely patent distally to the skull base without stenosis or occlusion. Both of the vertebral arteries arise from the subclavian arteries. Mild tortuosity with mild multifocal narrowing involving the proximal right V2 segment, similar to previous. Vertebral arteries otherwise patent within the neck without stenosis, occlusion, or findings to suggest dissection. IMPRESSION: MRI HEAD IMPRESSION: 1. 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 No associated mass effect. 2. 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 Again, findings can be seen in the setting of amyloid angiopathy or possibly remote traumatic brain injury. 3. 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 MRA HEAD IMPRESSION: 1. No large vessel occlusion or other acute finding. No hemodynamically significant or correctable stenosis. 2. 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 No associated hemorrhage. MRA NECK IMPRESSION: 1. 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 Otherwise wide patency of both carotid artery systems within the neck. 2. Patent vertebral arteries within the neck without hemodynamically significant stenosis. 3. 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 4. No other acute vascular abnormality  within the neck. Electronically Signed   By: Jeannine Boga M.D.   On: 08/20/2018 00:44   Mr Jodene Nam Head/brain GU Cm  Result Date: 08/20/2018 CLINICAL DATA:  Initial evaluation for acute headache, right eye pain. EXAM: MRI HEAD WITHOUT CONTRAST MRA HEAD WITHOUT CONTRAST MRA NECK WITHOUT AND WITH CONTRAST TECHNIQUE: Multiplanar, multiecho pulse sequences of the brain and surrounding structures were obtained without intravenous contrast. Angiographic images of the Circle of Willis were obtained using MRA technique without intravenous contrast. Angiographic images of the neck were obtained using MRA technique without and with intravenous contrast. Carotid stenosis measurements (when applicable) are obtained utilizing NASCET criteria, using the distal internal carotid diameter as the denominator. CONTRAST:  8 cc of Gadavist. COMPARISON:  Prior CT from prior the same day as well as previous MRI from 06/03/2018 FINDINGS: MRI HEAD FINDINGS Brain: Cerebral volume within normal limits for patient age. Scattered patchy T2/FLAIR hyperintensity within the periventricular and deep white matter both cerebral hemispheres, most consistent with chronic small vessel ischemic disease, mild in nature and stable from previous. Small chronic left cerebellar infarct noted. Additional chronic lacunar infarct noted at the posterior right periventricular white matter. 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 Few additional scattered chronic micro hemorrhages noted within the bilateral cerebral hemispheres, stable from previous. Findings could be related to amyloid angiopathy and/or prior traumatic brain injury. Patchy small volume acute ischemic nonhemorrhagic infarcts seen at the right occipital lobe (series 5, image 20). Largest area of infarction measures 15 mm. Additional punctate posterior right temporal infarct (series 5, image 18). No associated mass effect. No other areas of acute or  subacute infarction. Left temporal AVM again noted. No other mass lesion, midline shift or mass effect. No hydrocephalus. No extra-axial fluid collection. 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 Midline structures intact. Vascular: Major intracranial vascular flow voids maintained Skull and upper cervical spine: Craniocervical junction normal. Visualized upper cervical spine within normal limits. Bone marrow signal intensity normal. No scalp soft tissue abnormality. Sinuses/Orbits: Patient status post bilateral ocular lens replacement. Globes and orbital soft tissues demonstrate no acute finding. Paranasal sinuses are clear. No mastoid effusion. Inner ear structures normal. Paranasal sinuses are clear. No mastoid effusion. Inner ear structures normal. Other: None. MRA HEAD FINDINGS ANTERIOR CIRCULATION: Distal cervical segments of the internal carotid arteries are widely patent with symmetric antegrade flow. Atheromatous irregularity with mild multifocal narrowing at the petrous right ICA, stable. Petrous left ICA widely patent. Cavernous and supraclinoid segments patent without stenosis or other abnormality. A1 segments, anterior communicating artery common anterior cerebral arteries widely patent. M1 segments well perfused and patent bilaterally. Distal MCA branches well perfused and symmetric. Wispy tangle of vessels with shunting into adjacent cortical vein at the left temporal lobe compatible with known AVM, stable in appearance. Ninety symmetric approximately 15 mm. No associated hemorrhage. POSTERIOR CIRCULATION: Vertebral arteries patent to the vertebrobasilar junction without stenosis. Posterior inferior cerebral arteries patent proximally. Basilar patent to its distal aspect without stenosis. Superior cerebral arteries patent bilaterally. Both posterior cerebral arteries widely patent to their distal aspects without stenosis. MRA NECK FINDINGS Source images reviewed. Patent and  symmetric antegrade flow seen within the carotid and vertebral arteries bilaterally on time-of-flight sequence. Visualized aortic arch of normal caliber with normal 3 vessel morphology. No hemodynamically significant stenosis seen about the origin of the great vessels.  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 This is positioned immediately prior to the takeoff of the left vertebral artery. Visualized subclavian arteries otherwise widely patent and normal. Right common carotid artery patent from its origin to the bifurcation without stenosis. No significant atheromatous narrowing about the right bifurcation. Right ICA patent from its origin to the skull base without stenosis, dissection, or occlusion. Left common carotid artery patent from its origin to the bifurcation without stenosis. Short-segment atheromatous stenosis at Chi origin of the left ICA measures up to 40% by NASCET criteria. Left ICA otherwise widely patent distally to the skull base without stenosis or occlusion. Both of the vertebral arteries arise from the subclavian arteries. Mild tortuosity with mild multifocal narrowing involving the proximal right V2 segment, similar to previous. Vertebral arteries otherwise patent within the neck without stenosis, occlusion, or findings to suggest dissection. IMPRESSION: MRI HEAD IMPRESSION: 1. 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 No associated mass effect. 2. 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 Again, findings can be seen in the setting of amyloid angiopathy or possibly remote traumatic brain injury. 3. 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 MRA HEAD IMPRESSION: 1. No large vessel occlusion or other acute  finding. No hemodynamically significant or correctable stenosis. 2. 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 No associated hemorrhage. MRA NECK IMPRESSION: 1. 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 Otherwise wide patency of both carotid artery systems within the neck. 2. Patent vertebral arteries within the neck without hemodynamically significant stenosis. 3. 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 4. No other acute vascular abnormality within the neck. Electronically Signed   By: Jeannine Boga M.D.   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 Hyperglycemia (w/ T2NIDDM), hypophosphatemia. -Headache, visual field disturbance, L occipital CVA: 2-3wk Hx HA, worse/severe x2-3d. 1d Hx bitemporal hemianopsia. 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 Echo pending. ASA. Lipids, HbA1c. Neurology consult. P/T, O/T, SLP not consulted. -Hyperglycemia, T2NIDDM: SSI. -Hypophosphatemia: PO Phos, replete and monitor. -c/w other home meds/formulary subs as tolerated. -FEN/GI: Cardiac diabetic diet. -DVT PPx: Lovenox. -Code status: Full code. -Disposition: Admission, > 2 midnights.   All the records are reviewed and case discussed with ED provider. Management plans discussed with the patient, family and they are in agreement.  CODE STATUS: Full code.  TOTAL TIME TAKING CARE OF THIS PATIENT: 75  minutes.    Arta Silence M.D on 08/20/2018 at 1:15 AM  Between 7am to 6pm - Pager - [phone]  After 6pm go to [email or web address] - Proofreader  [facility]  Office  [phone]  CC: Primary care physician; Idelle Crouch, MD   Note: This dictation was prepared with Dragon dictation along with smaller phrase technology. Any transcriptional errors that result from  this process are unintentional.

## 2018-08-20 NOTE — Evaluation (Signed)
Physical Therapy Evaluation Patient Details Name: Jon [name] MRN: [id number] DOB: April 26, 1951 Today's Date: 08/20/2018   History of Present Illness   68 y.o. 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 He describes Homonomous Hemianopia.  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  Overall he did well and physically feels as though he is to his baseline.  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  Wife is out of home for work, but he feels good about going home and will be safe to do so w/o further PT intervention.     Follow Up Recommendations No PT follow up    Equipment Recommendations  None recommended by PT    Recommendations for Other Services       Precautions / Restrictions Precautions Precautions: Fall Restrictions Weight Bearing Restrictions: No      Mobility  Bed Mobility Overal bed mobility: Independent             General bed mobility comments: Pt easily gets to EOB w/o assist  Transfers Overall transfer level: Independent Equipment used: None             General transfer comment: Rises to standing with confidence and w/o issue  Ambulation/Gait Ambulation/Gait assistance: Independent Gait Distance (Feet): 350 Feet Assistive device: None       General Gait Details: Pt walks with safe and consistent cadence, community appropriate speed, no LOBs  Stairs Stairs: Yes Stairs assistance: Modified independent (Device/Increase time) Stair Management: One rail Left;Step to pattern Number of Stairs: 8 General stair comments: Pt was able to manage  steps w/o assist, definite need of rail, step-to for weaker L LE  Wheelchair Mobility    Modified Rankin (Stroke Patients Only)       Balance Overall balance assessment: Independent                                           Pertinent Vitals/Pain Pain Assessment: No/denies pain(slight headache)    Home Living Family/patient expects to be discharged to:: Private residence Living Arrangements: Spouse/significant other Available Help at Discharge: Family;Available PRN/intermittently Type of Home: House Home Access: Stairs to enter Entrance Stairs-Rails: Right;Left;Can reach both Entrance Stairs-Number of Steps: 6 steps + bilat rails from side entrance (primary); 5+1 steps for front entrance Home Layout: One level Home Equipment: Walker - 2 wheels;Cane - single point      Prior Function Level of Independence: Independent         Comments: Pt was independent with all ADLs/IADLs including driving and housework     Hand Dominance   Dominant Hand: Right    Extremity/Trunk Assessment   Upper Extremity Assessment Upper Extremity Assessment: Overall WFL for tasks assessed    Lower Extremity Assessment Lower Extremity Assessment: Overall WFL for tasks assessed(L functional but slighly weaker than R)       Communication   Communication: No difficulties  Cognition Arousal/Alertness: Awake/alert Behavior During Therapy: WFL for tasks assessed/performed Overall Cognitive Status: Within Functional Limits  for tasks assessed                                        General Comments      Exercises     Assessment/Plan    PT Assessment Patent does not need any further PT services  PT Problem List         PT Treatment Interventions      PT Goals (Current goals can be found in the Care Plan section)  Acute Rehab PT Goals Patient Stated Goal: go home today PT Goal Formulation: All assessment and education complete, DC therapy     Frequency     Barriers to discharge        Co-evaluation               AM-PAC PT "6 Clicks" Mobility  Outcome Measure Help needed turning from your back to your side while in a flat bed without using bedrails?: None Help needed moving from lying on your back to sitting on the side of a flat bed without using bedrails?: None Help needed moving to and from a bed to a chair (including a wheelchair)?: None Help needed standing up from a chair using your arms (e.g., wheelchair or bedside chair)?: None Help needed to walk in hospital room?: None Help needed climbing 3-5 steps with a railing? : None 6 Click Score: 24    End of Session Equipment Utilized During Treatment: Gait belt Activity Tolerance: Patient tolerated treatment well Patient left: with bed alarm set;with call bell/phone within reach Nurse Communication: Mobility status PT Visit Diagnosis: Difficulty in walking, not elsewhere classified (R26.2)    Time: 4163-8453 PT Time Calculation (min) (ACUTE ONLY): 21 min   Charges:   PT Evaluation $PT Eval Low Complexity: 1 Low          Kreg Shropshire, DPT 08/20/2018, 1:28 PM

## 2018-08-20 NOTE — Progress Notes (Signed)
Patient complaining of 10/10 headache after tylenol given prn. Received verbal order from Dr. Sidney Ace for fiorecet prn Q4PRN.

## 2018-08-20 NOTE — Progress Notes (Signed)
[facility] NAME: Jon [name]    MR#:  [id number]  DATE OF BIRTH:  03-09-1951  SUBJECTIVE:  CHIEF COMPLAINT:   Chief Complaint  Patient presents with  . Headache    REVIEW OF SYSTEMS:  CONSTITUTIONAL: No fever, fatigue or weakness.  EYES: No blurred or double vision.  EARS, NOSE, AND THROAT: No tinnitus or ear pain.  RESPIRATORY: No cough, shortness of breath, wheezing or hemoptysis.  CARDIOVASCULAR: No chest pain, orthopnea, edema.  GASTROINTESTINAL: No nausea, vomiting, diarrhea or abdominal pain.  GENITOURINARY: No dysuria, hematuria.  ENDOCRINE: No polyuria, nocturia,  HEMATOLOGY: No anemia, easy bruising or bleeding SKIN: No rash or lesion. MUSCULOSKELETAL: No joint pain or arthritis.   NEUROLOGIC: No tingling, numbness, weakness.  PSYCHIATRY: No anxiety or depression.   ROS  DRUG ALLERGIES:   Allergies  Allergen Reactions  . Beta Adrenergic Blockers Itching  . Topiramate Nausea And Vomiting    Other reaction(s): Dizziness, Hallucination Other Reaction: violence  . Aspirin Other (See Comments)    Per pt's PCP due to spontaneous bleeding in his brain.  . Benadryl [Diphenhydramine] Hives  . Lisinopril Itching    Other reaction(s): Other (See Comments) Leg pain  . Nabumetone Other (See Comments)    Reaction: unknown  . Valproic Acid Other (See Comments)    Reaction: Headache    VITALS:  Blood pressure (!) 138/93, pulse 83, temperature 98.8 F (37.1 C), temperature source Oral, resp. rate 16, height 5\' 6"  (1.676 m), weight 86.6 kg, SpO2 96 %.  PHYSICAL EXAMINATION:  GENERAL:  68 y.o.-year-old patient lying in the bed with no acute distress.  EYES: Pupils equal, round, reactive to light and accommodation. No scleral icterus. Extraocular muscles intact.  HEENT: Head atraumatic, normocephalic. Oropharynx and nasopharynx clear.  NECK:  Supple, no jugular venous distention. No thyroid enlargement, no  tenderness.  LUNGS: Normal breath sounds bilaterally, no wheezing, rales,rhonchi or crepitation. No use of accessory muscles of respiration.  CARDIOVASCULAR: S1, S2 normal. No murmurs, rubs, or gallops.  ABDOMEN: Soft, nontender, nondistended. Bowel sounds present. No organomegaly or mass.  EXTREMITIES: No pedal edema, cyanosis, or clubbing.  NEUROLOGIC: Cranial nerves II through XII are intact. Muscle strength 5/5 in all extremities. Sensation intact. Gait not checked.  PSYCHIATRIC: The patient is alert and oriented x 3.  SKIN: No obvious rash, lesion, or ulcer.   Physical Exam LABORATORY PANEL:   CBC Recent Labs  Lab 08/19/18 1644  WBC 4.6  HGB 15.7  HCT 46.2  PLT 181   ------------------------------------------------------------------------------------------------------------------  Chemistries  Recent Labs  Lab 08/19/18 1644  NA 137  K 4.1  CL 101  CO2 24  GLUCOSE 191*  BUN 11  CREATININE 0.93  CALCIUM 9.1  MG 2.4  AST 28  ALT 29  ALKPHOS 57  BILITOT 0.5   ------------------------------------------------------------------------------------------------------------------  Cardiac Enzymes No results for input(s): TROPONINI in the last 168 hours. ------------------------------------------------------------------------------------------------------------------  RADIOLOGY:  Ct Head W Or Wo Contrast  Result Date: 08/19/2018 CLINICAL DATA:  Headache for weeks with right eye pain. Bilateral visual loss today. Slowed speech. EXAM: CT HEAD WITHOUT AND WITH CONTRAST TECHNIQUE: Contiguous axial images were obtained from the base of the skull through the vertex without and with intravenous contrast CONTRAST:  1mL OMNIPAQUE IOHEXOL 300 MG/ML  SOLN COMPARISON:  Head CT 06/02/2018 and MRI 06/03/2018 FINDINGS: Brain: A new small wedge-shaped hypodensity posteriorly in the right occipital lobe is consistent with an acute infarct. There is no  evidence of acute intracranial  hemorrhage, midline shift, or extra-axial fluid collection. Cerebral white matter hypodensities are unchanged and nonspecific but compatible with mild chronic small vessel ischemic disease. 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 The ventricles and sulci are within normal limits for age. Post-contrast imaging again demonstrates an AVM in the left temporal lobe with 1.5 cm enhancing nidus and prominent draining vein coursing anteriorly eventually into the anterior aspect of this superior sagittal sinus. Left-sided pituitary mass is more conspicuous on MRI. Vascular: Calcified atherosclerosis at the skull base. Major dural venous sinuses and large arteries at the base of the brain are grossly patent. Skull: No fracture or focal osseous lesion. Sinuses/Orbits: Visualized paranasal sinuses and mastoid air cells are clear. Bilateral cataract extraction is noted. Other: None. IMPRESSION: 1. Small acute right occipital lobe infarct. 2. Mild chronic small vessel ischemic disease. 3. Known left temporal lobe AVM.  No acute hemorrhage. Electronically Signed   By: Logan Bores M.D.   On: 08/19/2018 18:18   Mr Jodene Nam Neck W Wo Contrast  Result Date: 08/20/2018 CLINICAL DATA:  Initial evaluation for acute headache, right eye pain. EXAM: MRI HEAD WITHOUT CONTRAST MRA HEAD WITHOUT CONTRAST MRA NECK WITHOUT AND WITH CONTRAST TECHNIQUE: Multiplanar, multiecho pulse sequences of the brain and surrounding structures were obtained without intravenous contrast. Angiographic images of the Circle of Willis were obtained using MRA technique without intravenous contrast. Angiographic images of the neck were obtained using MRA technique without and with intravenous contrast. Carotid stenosis measurements (when applicable) are obtained utilizing NASCET criteria, using the distal internal carotid diameter as the denominator. CONTRAST:  8 cc of Gadavist. COMPARISON:  Prior CT from prior the same day as well as  previous MRI from 06/03/2018 FINDINGS: MRI HEAD FINDINGS Brain: Cerebral volume within normal limits for patient age. Scattered patchy T2/FLAIR hyperintensity within the periventricular and deep white matter both cerebral hemispheres, most consistent with chronic small vessel ischemic disease, mild in nature and stable from previous. Small chronic left cerebellar infarct noted. Additional chronic lacunar infarct noted at the posterior right periventricular white matter. 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 Few additional scattered chronic micro hemorrhages noted within the bilateral cerebral hemispheres, stable from previous. Findings could be related to amyloid angiopathy and/or prior traumatic brain injury. Patchy small volume acute ischemic nonhemorrhagic infarcts seen at the right occipital lobe (series 5, image 20). Largest area of infarction measures 15 mm. Additional punctate posterior right temporal infarct (series 5, image 18). No associated mass effect. No other areas of acute or subacute infarction. Left temporal AVM again noted. No other mass lesion, midline shift or mass effect. No hydrocephalus. No extra-axial fluid collection. 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 Midline structures intact. Vascular: Major intracranial vascular flow voids maintained Skull and upper cervical spine: Craniocervical junction normal. Visualized upper cervical spine within normal limits. Bone marrow signal intensity normal. No scalp soft tissue abnormality. Sinuses/Orbits: Patient status post bilateral ocular lens replacement. Globes and orbital soft tissues demonstrate no acute finding. Paranasal sinuses are clear. No mastoid effusion. Inner ear structures normal. Paranasal sinuses are clear. No mastoid effusion. Inner ear structures normal. Other: None. MRA HEAD FINDINGS ANTERIOR CIRCULATION: Distal cervical segments of the internal carotid arteries are  widely patent with symmetric antegrade flow. Atheromatous irregularity with mild multifocal narrowing at the petrous right ICA, stable. Petrous left ICA widely patent. Cavernous and supraclinoid segments patent without stenosis or other abnormality. A1 segments,  anterior communicating artery common anterior cerebral arteries widely patent. M1 segments well perfused and patent bilaterally. Distal MCA branches well perfused and symmetric. Wispy tangle of vessels with shunting into adjacent cortical vein at the left temporal lobe compatible with known AVM, stable in appearance. Ninety symmetric approximately 15 mm. No associated hemorrhage. POSTERIOR CIRCULATION: Vertebral arteries patent to the vertebrobasilar junction without stenosis. Posterior inferior cerebral arteries patent proximally. Basilar patent to its distal aspect without stenosis. Superior cerebral arteries patent bilaterally. Both posterior cerebral arteries widely patent to their distal aspects without stenosis. MRA NECK FINDINGS Source images reviewed. Patent and symmetric antegrade flow seen within the carotid and vertebral arteries bilaterally on time-of-flight sequence. Visualized aortic arch of normal caliber with normal 3 vessel morphology. No hemodynamically significant stenosis seen about the origin of the great vessels. 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 This is positioned immediately prior to the takeoff of the left vertebral artery. Visualized subclavian arteries otherwise widely patent and normal. Right common carotid artery patent from its origin to the bifurcation without stenosis. No significant atheromatous narrowing about the right bifurcation. Right ICA patent from its origin to the skull base without stenosis, dissection, or occlusion. Left common carotid artery patent from its origin to the  bifurcation without stenosis. Short-segment atheromatous stenosis at Chi origin of the left ICA measures up to 40% by NASCET criteria. Left ICA otherwise widely patent distally to the skull base without stenosis or occlusion. Both of the vertebral arteries arise from the subclavian arteries. Mild tortuosity with mild multifocal narrowing involving the proximal right V2 segment, similar to previous. Vertebral arteries otherwise patent within the neck without stenosis, occlusion, or findings to suggest dissection. IMPRESSION: MRI HEAD IMPRESSION: 1. 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 No associated mass effect. 2. 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 Again, findings can be seen in the setting of amyloid angiopathy or possibly remote traumatic brain injury. 3. 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 MRA HEAD IMPRESSION: 1. No large vessel occlusion or other acute finding. No hemodynamically significant or correctable stenosis. 2. 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 No associated hemorrhage. MRA NECK IMPRESSION: 1. 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 Otherwise wide patency of both carotid artery systems within the neck. 2. Patent vertebral arteries within the neck without hemodynamically significant stenosis. 3. 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 4. No other acute vascular abnormality within the neck. Electronically Signed   By: Jeannine Boga M.D.   On: 08/20/2018 00:44   Mr Brain Wo Contrast  Result Date: 08/20/2018 CLINICAL DATA:  Initial evaluation for acute headache, right eye pain. EXAM: MRI HEAD WITHOUT CONTRAST MRA HEAD WITHOUT CONTRAST MRA  NECK WITHOUT AND WITH CONTRAST TECHNIQUE: Multiplanar, multiecho pulse sequences of the brain and surrounding structures were obtained without intravenous contrast. Angiographic images of the Circle of Willis were obtained using MRA technique without intravenous contrast. Angiographic images of the neck were obtained using MRA technique without and with intravenous contrast. Carotid stenosis measurements (when applicable) are obtained utilizing NASCET criteria, using the distal internal carotid diameter as the denominator. CONTRAST:  8 cc of Gadavist. COMPARISON:  Prior CT from prior the same  day as well as previous MRI from 06/03/2018 FINDINGS: MRI HEAD FINDINGS Brain: Cerebral volume within normal limits for patient age. Scattered patchy T2/FLAIR hyperintensity within the periventricular and deep white matter both cerebral hemispheres, most consistent with chronic small vessel ischemic disease, mild in nature and stable from previous. Small chronic left cerebellar infarct noted. Additional chronic lacunar infarct noted at the posterior right periventricular white matter. 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 Few additional scattered chronic micro hemorrhages noted within the bilateral cerebral hemispheres, stable from previous. Findings could be related to amyloid angiopathy and/or prior traumatic brain injury. Patchy small volume acute ischemic nonhemorrhagic infarcts seen at the right occipital lobe (series 5, image 20). Largest area of infarction measures 15 mm. Additional punctate posterior right temporal infarct (series 5, image 18). No associated mass effect. No other areas of acute or subacute infarction. Left temporal AVM again noted. No other mass lesion, midline shift or mass effect. No hydrocephalus. No extra-axial fluid collection. 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 Midline structures intact. Vascular: Major intracranial vascular flow  voids maintained Skull and upper cervical spine: Craniocervical junction normal. Visualized upper cervical spine within normal limits. Bone marrow signal intensity normal. No scalp soft tissue abnormality. Sinuses/Orbits: Patient status post bilateral ocular lens replacement. Globes and orbital soft tissues demonstrate no acute finding. Paranasal sinuses are clear. No mastoid effusion. Inner ear structures normal. Paranasal sinuses are clear. No mastoid effusion. Inner ear structures normal. Other: None. MRA HEAD FINDINGS ANTERIOR CIRCULATION: Distal cervical segments of the internal carotid arteries are widely patent with symmetric antegrade flow. Atheromatous irregularity with mild multifocal narrowing at the petrous right ICA, stable. Petrous left ICA widely patent. Cavernous and supraclinoid segments patent without stenosis or other abnormality. A1 segments, anterior communicating artery common anterior cerebral arteries widely patent. M1 segments well perfused and patent bilaterally. Distal MCA branches well perfused and symmetric. Wispy tangle of vessels with shunting into adjacent cortical vein at the left temporal lobe compatible with known AVM, stable in appearance. Ninety symmetric approximately 15 mm. No associated hemorrhage. POSTERIOR CIRCULATION: Vertebral arteries patent to the vertebrobasilar junction without stenosis. Posterior inferior cerebral arteries patent proximally. Basilar patent to its distal aspect without stenosis. Superior cerebral arteries patent bilaterally. Both posterior cerebral arteries widely patent to their distal aspects without stenosis. MRA NECK FINDINGS Source images reviewed. Patent and symmetric antegrade flow seen within the carotid and vertebral arteries bilaterally on time-of-flight sequence. Visualized aortic arch of normal caliber with normal 3 vessel morphology. No hemodynamically significant stenosis seen about the origin of the great vessels. Known short-segment  dissection with pseudoaneurysm involving the proximal left subclavian artery measures approximately 14 x 8 mm, not significantly changed from previous. This is positioned immediately prior to the takeoff of the left vertebral artery. Visualized subclavian arteries otherwise widely patent and normal. Right common carotid artery patent from its origin to the bifurcation without stenosis. No significant atheromatous narrowing about the right bifurcation. Right ICA patent from its origin to the skull base without stenosis, dissection, or occlusion. Left common carotid artery patent from its origin to the bifurcation without stenosis. Short-segment atheromatous stenosis at Chi origin of the left ICA measures up to 40% by NASCET criteria. Left ICA otherwise widely patent distally to the skull base without stenosis or occlusion. Both of the vertebral arteries arise from the subclavian arteries. Mild tortuosity with mild multifocal narrowing involving the proximal right V2 segment, similar to previous. Vertebral arteries otherwise patent within  the neck without stenosis, occlusion, or findings to suggest dissection. IMPRESSION: MRI HEAD IMPRESSION: 1. 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 No associated mass effect. 2. 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 Again, findings can be seen in the setting of amyloid angiopathy or possibly remote traumatic brain injury. 3. 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 MRA HEAD IMPRESSION: 1. No large vessel occlusion or other acute finding. No hemodynamically significant or correctable stenosis. 2. 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 No associated hemorrhage. MRA NECK IMPRESSION: 1. 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 Otherwise wide patency of both carotid  artery systems within the neck. 2. Patent vertebral arteries within the neck without hemodynamically significant stenosis. 3. 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 4. No other acute vascular abnormality within the neck. Electronically Signed   By: Jeannine Boga M.D.   On: 08/20/2018 00:44   Mr Jodene Nam Head/brain EL Cm  Result Date: 08/20/2018 CLINICAL DATA:  Initial evaluation for acute headache, right eye pain. EXAM: MRI HEAD WITHOUT CONTRAST MRA HEAD WITHOUT CONTRAST MRA NECK WITHOUT AND WITH CONTRAST TECHNIQUE: Multiplanar, multiecho pulse sequences of the brain and surrounding structures were obtained without intravenous contrast. Angiographic images of the Circle of Willis were obtained using MRA technique without intravenous contrast. Angiographic images of the neck were obtained using MRA technique without and with intravenous contrast. Carotid stenosis measurements (when applicable) are obtained utilizing NASCET criteria, using the distal internal carotid diameter as the denominator. CONTRAST:  8 cc of Gadavist. COMPARISON:  Prior CT from prior the same day as well as previous MRI from 06/03/2018 FINDINGS: MRI HEAD FINDINGS Brain: Cerebral volume within normal limits for patient age. Scattered patchy T2/FLAIR hyperintensity within the periventricular and deep white matter both cerebral hemispheres, most consistent with chronic small vessel ischemic disease, mild in nature and stable from previous. Small chronic left cerebellar infarct noted. Additional chronic lacunar infarct noted at the posterior right periventricular white matter. 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 Few additional scattered chronic micro hemorrhages noted within the bilateral cerebral hemispheres, stable from previous. Findings could be related to amyloid  angiopathy and/or prior traumatic brain injury. Patchy small volume acute ischemic nonhemorrhagic infarcts seen at the right occipital lobe (series 5, image 20). Largest area of infarction measures 15 mm. Additional punctate posterior right temporal infarct (series 5, image 18). No associated mass effect. No other areas of acute or subacute infarction. Left temporal AVM again noted. No other mass lesion, midline shift or mass effect. No hydrocephalus. No extra-axial fluid collection. 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 Midline structures intact. Vascular: Major intracranial vascular flow voids maintained Skull and upper cervical spine: Craniocervical junction normal. Visualized upper cervical spine within normal limits. Bone marrow signal intensity normal. No scalp soft tissue abnormality. Sinuses/Orbits: Patient status post bilateral ocular lens replacement. Globes and orbital soft tissues demonstrate no acute finding. Paranasal sinuses are clear. No mastoid effusion. Inner ear structures normal. Paranasal sinuses are clear. No mastoid effusion. Inner ear structures normal. Other: None. MRA HEAD FINDINGS ANTERIOR CIRCULATION: Distal cervical segments of the internal carotid arteries are widely patent with symmetric antegrade flow. Atheromatous irregularity with mild multifocal narrowing at the petrous right ICA, stable. Petrous left ICA widely  patent. Cavernous and supraclinoid segments patent without stenosis or other abnormality. A1 segments, anterior communicating artery common anterior cerebral arteries widely patent. M1 segments well perfused and patent bilaterally. Distal MCA branches well perfused and symmetric. Wispy tangle of vessels with shunting into adjacent cortical vein at the left temporal lobe compatible with known AVM, stable in appearance. Ninety symmetric approximately 15 mm. No associated hemorrhage. POSTERIOR CIRCULATION: Vertebral arteries patent to the  vertebrobasilar junction without stenosis. Posterior inferior cerebral arteries patent proximally. Basilar patent to its distal aspect without stenosis. Superior cerebral arteries patent bilaterally. Both posterior cerebral arteries widely patent to their distal aspects without stenosis. MRA NECK FINDINGS Source images reviewed. Patent and symmetric antegrade flow seen within the carotid and vertebral arteries bilaterally on time-of-flight sequence. Visualized aortic arch of normal caliber with normal 3 vessel morphology. No hemodynamically significant stenosis seen about the origin of the great vessels. 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 This is positioned immediately prior to the takeoff of the left vertebral artery. Visualized subclavian arteries otherwise widely patent and normal. Right common carotid artery patent from its origin to the bifurcation without stenosis. No significant atheromatous narrowing about the right bifurcation. Right ICA patent from its origin to the skull base without stenosis, dissection, or occlusion. Left common carotid artery patent from its origin to the bifurcation without stenosis. Short-segment atheromatous stenosis at Chi origin of the left ICA measures up to 40% by NASCET criteria. Left ICA otherwise widely patent distally to the skull base without stenosis or occlusion. Both of the vertebral arteries arise from the subclavian arteries. Mild tortuosity with mild multifocal narrowing involving the proximal right V2 segment, similar to previous. Vertebral arteries otherwise patent within the neck without stenosis, occlusion, or findings to suggest dissection. IMPRESSION: MRI HEAD IMPRESSION: 1. 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 No associated mass effect. 2. 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 Again, findings can be seen in the setting of amyloid angiopathy or possibly remote traumatic brain injury. 3. 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 MRA HEAD IMPRESSION: 1. No large vessel occlusion or other acute finding. No hemodynamically significant or correctable stenosis. 2. 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 No associated hemorrhage. MRA NECK IMPRESSION: 1. 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 Otherwise wide patency of both carotid artery systems within the neck. 2. Patent vertebral arteries within the neck without hemodynamically significant stenosis. 3. 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 4. No other acute vascular abnormality within the neck. Electronically Signed   By: Jeannine Boga M.D.   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 Management plans discussed with the patient,  family and they are in agreement.  CODE STATUS: full  TOTAL TIME TAKING CARE OF THIS PATIENT: 35 minutes.     POSSIBLE D/C IN 1-2 DAYS, DEPENDING ON CLINICAL CONDITION.   Avel Peace Thomasa Heidler M.D on 08/20/2018   Between 7am to 6pm - Pager - [phone]  After 6pm go to [email or web address] - password [email or web address] [facility]  Office  [phone]  CC: Primary care physician; Idelle Crouch, MD  Note: This dictation was prepared with Dragon dictation along with smaller phrase technology. Any transcriptional errors that result from this process are unintentional.

## 2018-08-20 NOTE — Consult Note (Signed)
Referring Physician: Salary    Chief Complaint: Visual changes  HPI: Jon [name] is an 68 y.o. 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 Patient reports that he began to have sever headaches daily about 2 weeks ago that were only relieved with Fioricet.  About a week ago noted that he was having visual changes and describes a "corona" in his visual field.  Yesterday he reports awakening and being unable to see things to the left.  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    Date last known well: Unable to determine Time last known well: Unable to determine tPA Given: No: Unable to determine LKW  Past Medical History:  Diagnosis Date  . Diabetes mellitus without complication ([place])   . Hypertension   . Pituitary tumor   . TIA (transient ischemic attack)         AVM  Past Surgical History:  Procedure Laterality Date  . COLONOSCOPY    . EYE SURGERY    . HERNIA REPAIR      Family history:  Brother with HTN and CAD.  Mother with Alzheimers.    Social History:  reports that he has never smoked. He has never used smokeless tobacco. He reports that he does not drink alcohol or use drugs.  Allergies:  Allergies  Allergen Reactions  . Beta Adrenergic Blockers Itching  . Topiramate Nausea And Vomiting    Other reaction(s): Dizziness, Hallucination Other Reaction: violence  . Aspirin Other (See Comments)    Per pt's PCP due to spontaneous bleeding in his brain.  . Benadryl [Diphenhydramine] Hives  . Lisinopril Itching    Other reaction(s): Other (See Comments) Leg pain  . Nabumetone Other (See Comments)    Reaction: unknown  . Valproic Acid Other (See Comments)    Reaction: Headache    Medications:  I have reviewed the patient's current medications. Prior to Admission:  Medications Prior to Admission  Medication Sig Dispense Refill Last Dose  . ARIPiprazole  (ABILIFY) 2 MG tablet Take 2 mg by mouth daily.   08/18/2018 at Unknown time  . carvedilol (COREG) 25 MG tablet Take 25 mg by mouth 2 (two) times daily with a meal.   08/18/2018 at 2000  . Cholecalciferol (D3-1000) 1000 units tablet Take 1,000 Units by mouth daily.   08/18/2018 at 0930  . DULoxetine (CYMBALTA) 60 MG capsule Take 60 mg by mouth daily.   08/19/2018 at 0930  . gabapentin (NEURONTIN) 300 MG capsule Take 600 mg by mouth 2 (two) times daily.    08/18/2018 at 2000  . glimepiride (AMARYL) 2 MG tablet Take 2 mg by mouth 2 (two) times daily.    08/18/2018 at 2000  . hydrOXYzine (ATARAX/VISTARIL) 10 MG tablet Take 1 tablet by mouth 3 (three) times daily as needed for itching.    prn at prn  . ibuprofen (ADVIL,MOTRIN) 200 MG tablet Take 200 mg by mouth every 6 (six) hours as needed for mild pain.   prn at prn  . losartan-hydrochlorothiazide (HYZAAR) 100-12.5 MG tablet Take 1 tablet by mouth daily.   08/18/2018 at 0930  . pantoprazole (PROTONIX) 40 MG tablet Take 40 mg by mouth daily.   08/18/2018 at 2000  . testosterone cypionate (DEPOTESTOSTERONE CYPIONATE) 200 MG/ML injection Inject 200 mg into the muscle every 14 (fourteen) days.   Past Month at Unknown time  . traZODone (DESYREL) 50 MG tablet  Take 50 mg by mouth at bedtime.    08/18/2018 at 2000  . vitamin B-12 (CYANOCOBALAMIN) 1000 MCG tablet Take 1,000 mcg by mouth daily.   08/18/2018 at 0930   Scheduled: . ARIPiprazole  2 mg Oral Daily  . aspirin  81 mg Oral Daily  . atorvastatin  40 mg Oral q1800  . carvedilol  25 mg Oral BID WC  . cholecalciferol  1,000 Units Oral Daily  . DULoxetine  60 mg Oral Daily  . enoxaparin (LOVENOX) injection  40 mg Subcutaneous Q24H  . gabapentin  600 mg Oral BID  . losartan  100 mg Oral Daily   And  . hydrochlorothiazide  12.5 mg Oral Daily  . insulin aspart  0-15 Units Subcutaneous TID WC  . insulin aspart  0-5 Units Subcutaneous QHS  . pantoprazole  40 mg Oral Daily  . phosphorus  500 mg Oral BID  .  traZODone  50 mg Oral QHS  . vitamin B-12  1,000 mcg Oral Daily    ROS: History obtained from the patient  General ROS: negative for - chills, fatigue, fever, night sweats, weight gain or weight loss Psychological ROS: negative for - behavioral disorder, hallucinations, memory difficulties, mood swings or suicidal ideation Ophthalmic ROS: as noted in HPI ENT ROS: negative for - epistaxis, nasal discharge, oral lesions, sore throat, tinnitus or vertigo Allergy and Immunology ROS: negative for - hives or itchy/watery eyes Hematological and Lymphatic ROS: negative for - bleeding problems, bruising or swollen lymph nodes Endocrine ROS: negative for - galactorrhea, hair pattern changes, polydipsia/polyuria or temperature intolerance Respiratory ROS: negative for - cough, hemoptysis, shortness of breath or wheezing Cardiovascular ROS: negative for - chest pain, dyspnea on exertion, edema or irregular heartbeat Gastrointestinal ROS: negative for - abdominal pain, diarrhea, hematemesis, nausea/vomiting or stool incontinence Genito-Urinary ROS: negative for - dysuria, hematuria, incontinence or urinary frequency/urgency Musculoskeletal ROS: negative for - joint swelling or muscular weakness Neurological ROS: as noted in HPI Dermatological ROS: negative for rash and skin lesion changes  Physical Examination: Blood pressure (!) 143/95, pulse 80, temperature 99.1 F (37.3 C), temperature source Oral, resp. rate 16, height 5\' 6"  (1.676 m), weight 86.6 kg, SpO2 96 %.  HEENT-  Normocephalic, no lesions, without obvious abnormality.  Normal external eye and conjunctiva.  Normal TM's bilaterally.  Normal auditory canals and external ears. Normal external nose, mucus membranes and septum.  Normal pharynx. Cardiovascular- S1, S2 normal, pulses palpable throughout   Lungs- chest clear, no wheezing, rales, normal symmetric air entry Abdomen- soft, non-tender; bowel sounds normal; no masses,  no  organomegaly Extremities- no edema Lymph-no adenopathy palpable Musculoskeletal-no joint tenderness, deformity or swelling Skin-warm and dry, no hyperpigmentation, vitiligo, or suspicious lesions  Neurological Examination   Mental Status: Alert, oriented, thought content appropriate.  Speech fluent without evidence of aphasia.  Able to follow 3 step commands without difficulty. Cranial Nerves: II: Discs flat bilaterally; Counts fingers in all visual fields but when looking at things from a distance unable to see things to the left, pupils equal, round, reactive to light and accommodation III,IV, VI: ptosis not present, extra-ocular motions intact bilaterally V,VII: smile symmetric, facial light touch sensation normal bilaterally VIII: hearing normal bilaterally IX,X: gag reflex present XI: bilateral shoulder shrug XII: midline tongue extension Motor: Right : Upper extremity   5/5    Left:     Upper extremity   5/5  Lower extremity   5/5     Lower extremity   5/5 Tone  and bulk:normal tone throughout; no atrophy noted Sensory: Pinprick and light touch intact throughout, bilaterally Deep Tendon Reflexes: Symmetric throughout Plantars: Right: downgoing   Left: downgoing Cerebellar: Normal finger-to-nose and normal heel-to-shin testing bilaterally Gait: not tested due to safety concerns    Laboratory Studies:  Basic Metabolic Panel: Recent Labs  Lab 08/19/18 1644  NA 137  K 4.1  CL 101  CO2 24  GLUCOSE 191*  BUN 11  CREATININE 0.93  CALCIUM 9.1  MG 2.4  PHOS 2.4*    Liver Function Tests: Recent Labs  Lab 08/19/18 1644  AST 28  ALT 29  ALKPHOS 57  BILITOT 0.5  PROT 7.1  ALBUMIN 4.6   No results for input(s): LIPASE, AMYLASE in the last 168 hours. No results for input(s): AMMONIA in the last 168 hours.  CBC: Recent Labs  Lab 08/19/18 1644  WBC 4.6  NEUTROABS 2.5  HGB 15.7  HCT 46.2  MCV 90.1  PLT 181    Cardiac Enzymes: No results for input(s):  CKTOTAL, CKMB, CKMBINDEX, TROPONINI in the last 168 hours.  BNP: Invalid input(s): POCBNP  CBG: Recent Labs  Lab 08/19/18 2214 08/20/18 0759  GLUCAP 174* 141*    Microbiology: No results found for this or any previous visit.  Coagulation Studies: Recent Labs    08/19/18 1644  LABPROT 12.2  INR 0.9    Urinalysis:  Recent Labs  Lab 08/19/18 1704  COLORURINE YELLOW*  LABSPEC 1.016  PHURINE 6.0  GLUCOSEU 50*  HGBUR NEGATIVE  BILIRUBINUR NEGATIVE  KETONESUR NEGATIVE  PROTEINUR NEGATIVE  NITRITE NEGATIVE  LEUKOCYTESUR NEGATIVE    Lipid Panel:    Component Value Date/Time   CHOL 220 (H) 08/20/2018 0401   TRIG 394 (H) 08/20/2018 0401   HDL 27 (L) 08/20/2018 0401   CHOLHDL 8.1 08/20/2018 0401   VLDL 79 (H) 08/20/2018 0401   LDLCALC 114 (H) 08/20/2018 0401    HgbA1C:  Lab Results  Component Value Date   HGBA1C 7.1 (H) 06/03/2018    Urine Drug Screen:      Component Value Date/Time   LABOPIA NONE DETECTED 08/19/2018 1704   COCAINSCRNUR NONE DETECTED 08/19/2018 1704   LABBENZ NONE DETECTED 08/19/2018 1704   AMPHETMU NONE DETECTED 08/19/2018 1704   THCU NONE DETECTED 08/19/2018 1704   LABBARB POSITIVE (A) 08/19/2018 1704    Alcohol Level:  Recent Labs  Lab 08/19/18 Delia <10    Other results: EKG: sinus rhythm at 64 bpm.  Imaging: Ct Head W Or Wo Contrast  Result Date: 08/19/2018 CLINICAL DATA:  Headache for weeks with right eye pain. Bilateral visual loss today. Slowed speech. EXAM: CT HEAD WITHOUT AND WITH CONTRAST TECHNIQUE: Contiguous axial images were obtained from the base of the skull through the vertex without and with intravenous contrast CONTRAST:  4mL OMNIPAQUE IOHEXOL 300 MG/ML  SOLN COMPARISON:  Head CT 06/02/2018 and MRI 06/03/2018 FINDINGS: Brain: A new small wedge-shaped hypodensity posteriorly in the right occipital lobe is consistent with an acute infarct. There is no evidence of acute intracranial hemorrhage, midline shift, or  extra-axial fluid collection. Cerebral white matter hypodensities are unchanged and nonspecific but compatible with mild chronic small vessel ischemic disease. 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 The ventricles and sulci are within normal limits for age. Post-contrast imaging again demonstrates an AVM in the left temporal lobe with 1.5 cm enhancing nidus and prominent draining vein coursing anteriorly eventually into the anterior aspect of this superior sagittal  sinus. Left-sided pituitary mass is more conspicuous on MRI. Vascular: Calcified atherosclerosis at the skull base. Major dural venous sinuses and large arteries at the base of the brain are grossly patent. Skull: No fracture or focal osseous lesion. Sinuses/Orbits: Visualized paranasal sinuses and mastoid air cells are clear. Bilateral cataract extraction is noted. Other: None. IMPRESSION: 1. Small acute right occipital lobe infarct. 2. Mild chronic small vessel ischemic disease. 3. Known left temporal lobe AVM.  No acute hemorrhage. Electronically Signed   By: Logan Bores M.D.   On: 08/19/2018 18:18   Mr Jodene Nam Neck W Wo Contrast  Result Date: 08/20/2018 CLINICAL DATA:  Initial evaluation for acute headache, right eye pain. EXAM: MRI HEAD WITHOUT CONTRAST MRA HEAD WITHOUT CONTRAST MRA NECK WITHOUT AND WITH CONTRAST TECHNIQUE: Multiplanar, multiecho pulse sequences of the brain and surrounding structures were obtained without intravenous contrast. Angiographic images of the Circle of Willis were obtained using MRA technique without intravenous contrast. Angiographic images of the neck were obtained using MRA technique without and with intravenous contrast. Carotid stenosis measurements (when applicable) are obtained utilizing NASCET criteria, using the distal internal carotid diameter as the denominator. CONTRAST:  8 cc of Gadavist. COMPARISON:  Prior CT from prior the same day as well as previous MRI from 06/03/2018  FINDINGS: MRI HEAD FINDINGS Brain: Cerebral volume within normal limits for patient age. Scattered patchy T2/FLAIR hyperintensity within the periventricular and deep white matter both cerebral hemispheres, most consistent with chronic small vessel ischemic disease, mild in nature and stable from previous. Small chronic left cerebellar infarct noted. Additional chronic lacunar infarct noted at the posterior right periventricular white matter. 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 Few additional scattered chronic micro hemorrhages noted within the bilateral cerebral hemispheres, stable from previous. Findings could be related to amyloid angiopathy and/or prior traumatic brain injury. Patchy small volume acute ischemic nonhemorrhagic infarcts seen at the right occipital lobe (series 5, image 20). Largest area of infarction measures 15 mm. Additional punctate posterior right temporal infarct (series 5, image 18). No associated mass effect. No other areas of acute or subacute infarction. Left temporal AVM again noted. No other mass lesion, midline shift or mass effect. No hydrocephalus. No extra-axial fluid collection. 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 Midline structures intact. Vascular: Major intracranial vascular flow voids maintained Skull and upper cervical spine: Craniocervical junction normal. Visualized upper cervical spine within normal limits. Bone marrow signal intensity normal. No scalp soft tissue abnormality. Sinuses/Orbits: Patient status post bilateral ocular lens replacement. Globes and orbital soft tissues demonstrate no acute finding. Paranasal sinuses are clear. No mastoid effusion. Inner ear structures normal. Paranasal sinuses are clear. No mastoid effusion. Inner ear structures normal. Other: None. MRA HEAD FINDINGS ANTERIOR CIRCULATION: Distal cervical segments of the internal carotid arteries are widely patent with symmetric  antegrade flow. Atheromatous irregularity with mild multifocal narrowing at the petrous right ICA, stable. Petrous left ICA widely patent. Cavernous and supraclinoid segments patent without stenosis or other abnormality. A1 segments, anterior communicating artery common anterior cerebral arteries widely patent. M1 segments well perfused and patent bilaterally. Distal MCA branches well perfused and symmetric. Wispy tangle of vessels with shunting into adjacent cortical vein at the left temporal lobe compatible with known AVM, stable in appearance. Ninety symmetric approximately 15 mm. No associated hemorrhage. POSTERIOR CIRCULATION: Vertebral arteries patent to the vertebrobasilar junction without stenosis. Posterior inferior cerebral arteries patent proximally. Basilar patent to its distal aspect without stenosis. Superior cerebral arteries  patent bilaterally. Both posterior cerebral arteries widely patent to their distal aspects without stenosis. MRA NECK FINDINGS Source images reviewed. Patent and symmetric antegrade flow seen within the carotid and vertebral arteries bilaterally on time-of-flight sequence. Visualized aortic arch of normal caliber with normal 3 vessel morphology. No hemodynamically significant stenosis seen about the origin of the great vessels. 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 This is positioned immediately prior to the takeoff of the left vertebral artery. Visualized subclavian arteries otherwise widely patent and normal. Right common carotid artery patent from its origin to the bifurcation without stenosis. No significant atheromatous narrowing about the right bifurcation. Right ICA patent from its origin to the skull base without stenosis, dissection, or occlusion. Left common carotid artery patent from its origin to the bifurcation without stenosis.  Short-segment atheromatous stenosis at [place] origin of the left ICA measures up to 40% by NASCET criteria. Left ICA otherwise widely patent distally to the skull base without stenosis or occlusion. Both of the vertebral arteries arise from the subclavian arteries. Mild tortuosity with mild multifocal narrowing involving the proximal right V2 segment, similar to previous. Vertebral arteries otherwise patent within the neck without stenosis, occlusion, or findings to suggest dissection. IMPRESSION: MRI HEAD IMPRESSION: 1. 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 No associated mass effect. 2. 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 Again, findings can be seen in the setting of amyloid angiopathy or possibly remote traumatic brain injury. 3. 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 MRA HEAD IMPRESSION: 1. No large vessel occlusion or other acute finding. No hemodynamically significant or correctable stenosis. 2. 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 No associated hemorrhage. MRA NECK IMPRESSION: 1. 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 Otherwise wide patency of both carotid artery systems within the neck. 2. Patent vertebral arteries within the neck without hemodynamically significant stenosis. 3. 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 4. No other acute vascular abnormality within the neck. Electronically Signed   By: Jeannine Boga M.D.   On: 08/20/2018 00:44   Mr Brain Wo Contrast  Result Date: 08/20/2018 CLINICAL DATA:  Initial evaluation for acute headache, right eye pain. EXAM: MRI HEAD WITHOUT CONTRAST MRA HEAD WITHOUT CONTRAST MRA NECK WITHOUT AND WITH CONTRAST  TECHNIQUE: Multiplanar, multiecho pulse sequences of the brain and surrounding structures were obtained without intravenous contrast. Angiographic images of the Circle of Willis were obtained using MRA technique without intravenous contrast. Angiographic images of the neck were obtained using MRA technique without and with intravenous contrast. Carotid stenosis measurements (when applicable) are obtained utilizing NASCET criteria, using the distal internal carotid diameter as the denominator. CONTRAST:  8 cc of Gadavist. COMPARISON:  Prior CT from prior the same day as well as previous MRI from 06/03/2018 FINDINGS: MRI HEAD FINDINGS Brain: Cerebral volume within normal limits for patient age. Scattered patchy T2/FLAIR hyperintensity within the periventricular and deep white matter both cerebral hemispheres, most consistent with chronic small vessel ischemic disease, mild in nature and stable from previous. Small chronic left cerebellar infarct noted. Additional chronic lacunar infarct noted at the posterior right periventricular white matter. 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 Few additional scattered  chronic micro hemorrhages noted within the bilateral cerebral hemispheres, stable from previous. Findings could be related to amyloid angiopathy and/or prior traumatic brain injury. Patchy small volume acute ischemic nonhemorrhagic infarcts seen at the right occipital lobe (series 5, image 20). Largest area of infarction measures 15 mm. Additional punctate posterior right temporal infarct (series 5, image 18). No associated mass effect. No other areas of acute or subacute infarction. Left temporal AVM again noted. No other mass lesion, midline shift or mass effect. No hydrocephalus. No extra-axial fluid collection. 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 Midline structures intact. Vascular: Major intracranial vascular flow voids maintained Skull and upper  cervical spine: Craniocervical junction normal. Visualized upper cervical spine within normal limits. Bone marrow signal intensity normal. No scalp soft tissue abnormality. Sinuses/Orbits: Patient status post bilateral ocular lens replacement. Globes and orbital soft tissues demonstrate no acute finding. Paranasal sinuses are clear. No mastoid effusion. Inner ear structures normal. Paranasal sinuses are clear. No mastoid effusion. Inner ear structures normal. Other: None. MRA HEAD FINDINGS ANTERIOR CIRCULATION: Distal cervical segments of the internal carotid arteries are widely patent with symmetric antegrade flow. Atheromatous irregularity with mild multifocal narrowing at the petrous right ICA, stable. Petrous left ICA widely patent. Cavernous and supraclinoid segments patent without stenosis or other abnormality. A1 segments, anterior communicating artery common anterior cerebral arteries widely patent. M1 segments well perfused and patent bilaterally. Distal MCA branches well perfused and symmetric. Wispy tangle of vessels with shunting into adjacent cortical vein at the left temporal lobe compatible with known AVM, stable in appearance. Ninety symmetric approximately 15 mm. No associated hemorrhage. POSTERIOR CIRCULATION: Vertebral arteries patent to the vertebrobasilar junction without stenosis. Posterior inferior cerebral arteries patent proximally. Basilar patent to its distal aspect without stenosis. Superior cerebral arteries patent bilaterally. Both posterior cerebral arteries widely patent to their distal aspects without stenosis. MRA NECK FINDINGS Source images reviewed. Patent and symmetric antegrade flow seen within the carotid and vertebral arteries bilaterally on time-of-flight sequence. Visualized aortic arch of normal caliber with normal 3 vessel morphology. No hemodynamically significant stenosis seen about the origin of the great vessels. Known short-segment dissection with pseudoaneurysm  involving the proximal left subclavian artery measures approximately 14 x 8 mm, not significantly changed from previous. This is positioned immediately prior to the takeoff of the left vertebral artery. Visualized subclavian arteries otherwise widely patent and normal. Right common carotid artery patent from its origin to the bifurcation without stenosis. No significant atheromatous narrowing about the right bifurcation. Right ICA patent from its origin to the skull base without stenosis, dissection, or occlusion. Left common carotid artery patent from its origin to the bifurcation without stenosis. Short-segment atheromatous stenosis at [place] origin of the left ICA measures up to 40% by NASCET criteria. Left ICA otherwise widely patent distally to the skull base without stenosis or occlusion. Both of the vertebral arteries arise from the subclavian arteries. Mild tortuosity with mild multifocal narrowing involving the proximal right V2 segment, similar to previous. Vertebral arteries otherwise patent within the neck without stenosis, occlusion, or findings to suggest dissection. IMPRESSION: MRI HEAD IMPRESSION: 1. 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 No associated mass effect. 2. 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 Again, findings can be seen in the setting of amyloid angiopathy or possibly remote traumatic brain injury. 3. 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 MRA HEAD IMPRESSION: 1. No large  vessel occlusion or other acute finding. No hemodynamically significant or correctable stenosis. 2. 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 No associated hemorrhage. MRA NECK IMPRESSION: 1. 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 Otherwise wide patency of both carotid artery systems within the neck. 2.  Patent vertebral arteries within the neck without hemodynamically significant stenosis. 3. 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 4. No other acute vascular abnormality within the neck. Electronically Signed   By: Jeannine Boga M.D.   On: 08/20/2018 00:44   Mr Jodene Nam Head/brain IR Cm  Result Date: 08/20/2018 CLINICAL DATA:  Initial evaluation for acute headache, right eye pain. EXAM: MRI HEAD WITHOUT CONTRAST MRA HEAD WITHOUT CONTRAST MRA NECK WITHOUT AND WITH CONTRAST TECHNIQUE: Multiplanar, multiecho pulse sequences of the brain and surrounding structures were obtained without intravenous contrast. Angiographic images of the Circle of Willis were obtained using MRA technique without intravenous contrast. Angiographic images of the neck were obtained using MRA technique without and with intravenous contrast. Carotid stenosis measurements (when applicable) are obtained utilizing NASCET criteria, using the distal internal carotid diameter as the denominator. CONTRAST:  8 cc of Gadavist. COMPARISON:  Prior CT from prior the same day as well as previous MRI from 06/03/2018 FINDINGS: MRI HEAD FINDINGS Brain: Cerebral volume within normal limits for patient age. Scattered patchy T2/FLAIR hyperintensity within the periventricular and deep white matter both cerebral hemispheres, most consistent with chronic small vessel ischemic disease, mild in nature and stable from previous. Small chronic left cerebellar infarct noted. Additional chronic lacunar infarct noted at the posterior right periventricular white matter. 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 Few additional scattered chronic micro hemorrhages noted within the bilateral cerebral hemispheres, stable from previous. Findings could be related to amyloid angiopathy and/or prior traumatic brain  injury. Patchy small volume acute ischemic nonhemorrhagic infarcts seen at the right occipital lobe (series 5, image 20). Largest area of infarction measures 15 mm. Additional punctate posterior right temporal infarct (series 5, image 18). No associated mass effect. No other areas of acute or subacute infarction. Left temporal AVM again noted. No other mass lesion, midline shift or mass effect. No hydrocephalus. No extra-axial fluid collection. 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 Midline structures intact. Vascular: Major intracranial vascular flow voids maintained Skull and upper cervical spine: Craniocervical junction normal. Visualized upper cervical spine within normal limits. Bone marrow signal intensity normal. No scalp soft tissue abnormality. Sinuses/Orbits: Patient status post bilateral ocular lens replacement. Globes and orbital soft tissues demonstrate no acute finding. Paranasal sinuses are clear. No mastoid effusion. Inner ear structures normal. Paranasal sinuses are clear. No mastoid effusion. Inner ear structures normal. Other: None. MRA HEAD FINDINGS ANTERIOR CIRCULATION: Distal cervical segments of the internal carotid arteries are widely patent with symmetric antegrade flow. Atheromatous irregularity with mild multifocal narrowing at the petrous right ICA, stable. Petrous left ICA widely patent. Cavernous and supraclinoid segments patent without stenosis or other abnormality. A1 segments, anterior communicating artery common anterior cerebral arteries widely patent. M1 segments well perfused and patent bilaterally. Distal MCA branches well perfused and symmetric. Wispy tangle of vessels with shunting into adjacent cortical vein at the left temporal lobe compatible with known AVM, stable in appearance. Ninety symmetric approximately 15 mm. No associated hemorrhage. POSTERIOR CIRCULATION: Vertebral arteries patent to the vertebrobasilar junction without stenosis. Posterior  inferior cerebral  arteries patent proximally. Basilar patent to its distal aspect without stenosis. Superior cerebral arteries patent bilaterally. Both posterior cerebral arteries widely patent to their distal aspects without stenosis. MRA NECK FINDINGS Source images reviewed. Patent and symmetric antegrade flow seen within the carotid and vertebral arteries bilaterally on time-of-flight sequence. Visualized aortic arch of normal caliber with normal 3 vessel morphology. No hemodynamically significant stenosis seen about the origin of the great vessels. 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 This is positioned immediately prior to the takeoff of the left vertebral artery. Visualized subclavian arteries otherwise widely patent and normal. Right common carotid artery patent from its origin to the bifurcation without stenosis. No significant atheromatous narrowing about the right bifurcation. Right ICA patent from its origin to the skull base without stenosis, dissection, or occlusion. Left common carotid artery patent from its origin to the bifurcation without stenosis. Short-segment atheromatous stenosis at Chi origin of the left ICA measures up to 40% by NASCET criteria. Left ICA otherwise widely patent distally to the skull base without stenosis or occlusion. Both of the vertebral arteries arise from the subclavian arteries. Mild tortuosity with mild multifocal narrowing involving the proximal right V2 segment, similar to previous. Vertebral arteries otherwise patent within the neck without stenosis, occlusion, or findings to suggest dissection. IMPRESSION: MRI HEAD IMPRESSION: 1. 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 No associated mass effect. 2. 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 Again, findings can be seen in the setting of amyloid angiopathy or possibly remote traumatic brain injury. 3. 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 MRA HEAD IMPRESSION: 1. No large vessel occlusion or other acute finding. No hemodynamically significant or correctable stenosis. 2. 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 No associated hemorrhage. MRA NECK IMPRESSION: 1. 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 Otherwise wide patency of both carotid artery systems within the neck. 2. Patent vertebral arteries within the neck without hemodynamically significant stenosis. 3. 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 4. No other acute vascular abnormality within the neck. Electronically Signed   By: Jeannine Boga M.D.   On: 08/20/2018 00:44    Assessment: 68 y.o. male with a history of HTN, HLD and DM who presents with visual changes that seem to have been present to some degree for at least a week.  MRI of the brain reviewed and shows multiple, small, acute infarcts in the right PCA territory.  Atherosclerotic etiology likely.  Pituitary adenoma was stable.  MRA of the head and neck shows no hemodynamically significant stenosis.  Left temporal AVM noted and unchanged.    Echocardiogram pending.  A1c pending, LDL 114.  Stroke Risk Factors - diabetes mellitus, hyperlipidemia and hypertension  Plan: 1. PT consult, OT consult, Speech consult 2. Echocardiogram bubble study pending 3. Prophylactic therapy-ASA 81mg  daily 4. NPO until RN stroke swallow screen 5. Telemetry monitoring 6. Frequent neuro checks 7. Statin for lipid management with target LDL<70. 8. Continue follow up with neurology on an outpatient basis 9. May use Fioricet prn for headache  abortion   Alexis Goodell, MD Neurology [phone] 08/20/2018, 10:27 AM

## 2018-08-21 LAB — GLUCOSE, CAPILLARY: Glucose-Capillary: 162 mg/dL — ABNORMAL HIGH (ref 70–99)

## 2018-08-21 MED ORDER — ATORVASTATIN CALCIUM 40 MG PO TABS: 40.0000 mg | ORAL_TABLET | Freq: Every day | ORAL | 0 refills | Status: DC

## 2018-08-21 MED ORDER — BUTALBITAL-APAP-CAFFEINE 50-325-40 MG PO TABS: 1.0000 | ORAL_TABLET | ORAL | 0 refills | Status: DC | PRN

## 2018-08-21 MED ORDER — ASPIRIN 81 MG PO CHEW: 81.0000 mg | CHEWABLE_TABLET | Freq: Every day | ORAL | 0 refills | Status: DC

## 2018-08-21 NOTE — Discharge Summary (Signed)
[facility] [facility] [place] NAME: Jon [name]    MR#:  [id number]  DATE OF BIRTH:  Jan 04, 1951  DATE OF ADMISSION:  08/19/2018 ADMITTING PHYSICIAN: Arta Silence, MD  DATE OF DISCHARGE: No discharge date for patient encounter.  PRIMARY CARE PHYSICIAN: Idelle Crouch, MD    ADMISSION DIAGNOSIS:  Occipital stroke [facility]) [I63.9]  DISCHARGE DIAGNOSIS:  Active Problems:   Occipital stroke ([place])   SECONDARY DIAGNOSIS:   Past Medical History:  Diagnosis Date  . Diabetes mellitus without complication ([place])   . Hypertension   . Pituitary tumor   . 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 Manuella Ghazi in 1 week for reevaluation  *Acute on chronic headaches Neurology did see patient while in house  stable on Fioricet as needed  *Chronic benign essential hypertension Stable Continued home regiment  *Chronic diabetes mellitus type 2 Stable on current regiment  *Acute on chronic pain syndrome Stable Continued home regiment  DISCHARGE CONDITIONS:   stable  CONSULTS OBTAINED:  Treatment Team:  Arta Silence, MD Catarina Hartshorn, MD Alexis Goodell, MD  DRUG ALLERGIES:   Allergies  Allergen Reactions  . Beta Adrenergic Blockers Itching  . Topiramate Nausea And Vomiting    Other reaction(s): Dizziness, Hallucination Other Reaction: violence  . Aspirin Other (See Comments)    Per pt's PCP due to spontaneous bleeding in his brain.  . Benadryl [Diphenhydramine] Hives  . Lisinopril  Itching    Other reaction(s): Other (See Comments) Leg pain  . Nabumetone Other (See Comments)    Reaction: unknown  . Valproic Acid Other (See Comments)    Reaction: Headache    DISCHARGE MEDICATIONS:   Allergies as of 08/21/2018      Reactions   Beta Adrenergic Blockers Itching   Topiramate Nausea And Vomiting   Other reaction(s): Dizziness, Hallucination Other Reaction: violence   Aspirin Other (See Comments)   Per pt's PCP due to spontaneous bleeding in his brain.   Benadryl [diphenhydramine] Hives   Lisinopril Itching   Other reaction(s): Other (See Comments) Leg pain   Nabumetone Other (See Comments)   Reaction: unknown   Valproic Acid Other (See Comments)   Reaction: Headache      Medication List    TAKE these medications   ARIPiprazole 2 MG tablet Commonly known as:  ABILIFY Take 2 mg by mouth daily.   aspirin 81 MG chewable tablet Chew 1 tablet (81 mg total) by mouth daily. Start taking on:  August 22, 2018   atorvastatin 40 MG tablet Commonly known as:  LIPITOR Take 1 tablet (40 mg total) by mouth daily at 6 PM.   butalbital-acetaminophen-caffeine 50-325-40 MG tablet Commonly known as:  FIORICET, ESGIC Take 1 tablet by mouth every 4 (four) hours as needed for headache or migraine.   carvedilol 25 MG tablet Commonly known as:  COREG Take 25 mg by mouth 2 (two) times daily with a meal.   D3-1000 25 MCG (1000 UT) tablet Generic drug:  Cholecalciferol Take 1,000 Units by mouth daily.   DULoxetine 60 MG capsule Commonly known as:  CYMBALTA Take 60 mg by mouth daily.  gabapentin 300 MG capsule Commonly known as:  NEURONTIN Take 600 mg by mouth 2 (two) times daily.   glimepiride 2 MG tablet Commonly known as:  AMARYL Take 2 mg by mouth 2 (two) times daily.   hydrOXYzine 10 MG tablet Commonly known as:  ATARAX/VISTARIL Take 1 tablet by mouth 3 (three) times daily as needed for itching.   ibuprofen 200 MG tablet Commonly known as:   ADVIL,MOTRIN Take 200 mg by mouth every 6 (six) hours as needed for mild pain.   losartan-hydrochlorothiazide 100-12.5 MG tablet Commonly known as:  HYZAAR Take 1 tablet by mouth daily.   pantoprazole 40 MG tablet Commonly known as:  PROTONIX Take 40 mg by mouth daily.   testosterone cypionate 200 MG/ML injection Commonly known as:  DEPOTESTOSTERONE CYPIONATE Inject 200 mg into the muscle every 14 (fourteen) days.   traZODone 50 MG tablet Commonly known as:  DESYREL Take 50 mg by mouth at bedtime.   vitamin B-12 1000 MCG tablet Commonly known as:  CYANOCOBALAMIN Take 1,000 mcg by mouth daily.        DISCHARGE INSTRUCTIONS:   if you experience worsening of your admission symptoms, develop shortness of breath, life threatening emergency, suicidal or homicidal thoughts you must seek medical attention immediately by calling 911 or calling your MD immediately  if symptoms less severe.  You Must read complete instructions/literature along with all the possible adverse reactions/side effects for all the Medicines you take and that have been prescribed to you. Take any new Medicines after you have completely understood and accept all the possible adverse reactions/side effects.   Please note  You were cared for by a hospitalist during your hospital stay. If you have any questions about your discharge medications or the care you received while you were in the hospital after you are discharged, you can call the unit and asked to speak with the hospitalist on call if the hospitalist that took care of you is not available. Once you are discharged, your primary care physician will handle any further medical issues. Please note that NO REFILLS for any discharge medications will be authorized once you are discharged, as it is imperative that you return to your primary care physician (or establish a relationship with a primary care physician if you do not have one) for your aftercare needs so that  they can reassess your need for medications and monitor your lab values.    Today   CHIEF COMPLAINT:   Chief Complaint  Patient presents with  . Headache    HISTORY OF PRESENT ILLNESS:   68 y.o. 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 He describes a bitemporal hemianopsia. He endorses headache x2-3wks, constant, over left temple and behind the eyes. This headache has been worse x2-3d, and he states it became so severe on the date of admission (Sat 08/19/2018) that he "broke down". He states he has been unable to sleep due to pain. He states he woke up on the morning of Saturday 04/11 and turned on the TV. He noticed he could only read the subtitles on the right side of the screen. He called his wife, and she drove him to the ED. He endorses chronic mild L-sided weakness and sensory deficit to crude touch, which he has had since late January 2020; this is at baseline, no worse than usual. Motor, CN, reflex exams WNL. He does not exhibit aphasia or  dysarthia. He is rather astute, and tells me that he is concerned his pituitary mass has increased in size. 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." MRA brain demonstrates, "No large vessel occlusion or other acute finding."   VITAL SIGNS:  Blood pressure 138/87, pulse 73, temperature 98.4 F (36.9 C), temperature source Oral, resp. rate 16, height 5\' 6"  (1.676 m), weight 86.6 kg, SpO2 95 %.  I/O:    Intake/Output Summary (Last 24 hours) at 08/21/2018 1002 Last data filed at 08/20/2018 1700 Gross per 24 hour  Intake 240 ml  Output -  Net 240 ml    PHYSICAL EXAMINATION:  GENERAL:  68 y.o.-year-old patient lying  in the bed with no acute distress.  EYES: Pupils equal, round, reactive to light and accommodation. No scleral icterus. Extraocular muscles intact.  HEENT: Head atraumatic, normocephalic. Oropharynx and nasopharynx clear.  NECK:  Supple, no jugular venous distention. No thyroid enlargement, no tenderness.  LUNGS: Normal breath sounds bilaterally, no wheezing, rales,rhonchi or crepitation. No use of accessory muscles of respiration.  CARDIOVASCULAR: S1, S2 normal. No murmurs, rubs, or gallops.  ABDOMEN: Soft, non-tender, non-distended. Bowel sounds present. No organomegaly or mass.  EXTREMITIES: No pedal edema, cyanosis, or clubbing.  NEUROLOGIC: Cranial nerves II through XII are intact. Muscle strength 5/5 in all extremities. Sensation intact. Gait not checked.  PSYCHIATRIC: The patient is alert and oriented x 3.  SKIN: No obvious rash, lesion, or ulcer.   DATA REVIEW:   CBC Recent Labs  Lab 08/19/18 1644  WBC 4.6  HGB 15.7  HCT 46.2  PLT 181    Chemistries  Recent Labs  Lab 08/19/18 1644  NA 137  K 4.1  CL 101  CO2 24  GLUCOSE 191*  BUN 11  CREATININE 0.93  CALCIUM 9.1  MG 2.4  AST 28  ALT 29  ALKPHOS 57  BILITOT 0.5    Cardiac Enzymes No results for input(s): TROPONINI in the last 168 hours.  Microbiology Results  No results found for this or any previous visit.  RADIOLOGY:  Ct Head W Or Wo Contrast  Result Date: 08/19/2018 CLINICAL DATA:  Headache for weeks with right eye pain. Bilateral visual loss today. Slowed speech. EXAM: CT HEAD WITHOUT AND WITH CONTRAST TECHNIQUE: Contiguous axial images were obtained from the base of the skull through the vertex without and with intravenous contrast CONTRAST:  20mL OMNIPAQUE IOHEXOL 300 MG/ML  SOLN COMPARISON:  Head CT 06/02/2018 and MRI 06/03/2018 FINDINGS: Brain: A new small wedge-shaped hypodensity posteriorly in the right occipital lobe is consistent with an acute infarct. There is no evidence of acute intracranial  hemorrhage, midline shift, or extra-axial fluid collection. Cerebral white matter hypodensities are unchanged and nonspecific but compatible with mild chronic small vessel ischemic disease. 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 The ventricles and sulci are within normal limits for age. Post-contrast imaging again demonstrates an AVM in the left temporal lobe with 1.5 cm enhancing nidus and prominent draining vein coursing anteriorly eventually into the anterior aspect of this superior sagittal sinus. Left-sided pituitary mass is more conspicuous on MRI. Vascular: Calcified atherosclerosis at the skull base. Major dural venous sinuses and large arteries at the base of the brain are grossly patent. Skull: No fracture or focal osseous lesion. Sinuses/Orbits: Visualized  paranasal sinuses and mastoid air cells are clear. Bilateral cataract extraction is noted. Other: None. IMPRESSION: 1. Small acute right occipital lobe infarct. 2. Mild chronic small vessel ischemic disease. 3. Known left temporal lobe AVM.  No acute hemorrhage. Electronically Signed   By: Logan Bores M.D.   On: 08/19/2018 18:18   Mr Jodene Nam Neck W Wo Contrast  Result Date: 08/20/2018 CLINICAL DATA:  Initial evaluation for acute headache, right eye pain. EXAM: MRI HEAD WITHOUT CONTRAST MRA HEAD WITHOUT CONTRAST MRA NECK WITHOUT AND WITH CONTRAST TECHNIQUE: Multiplanar, multiecho pulse sequences of the brain and surrounding structures were obtained without intravenous contrast. Angiographic images of the Circle of Willis were obtained using MRA technique without intravenous contrast. Angiographic images of the neck were obtained using MRA technique without and with intravenous contrast. Carotid stenosis measurements (when applicable) are obtained utilizing NASCET criteria, using the distal internal carotid diameter as the denominator. CONTRAST:  8 cc of Gadavist. COMPARISON:  Prior CT from prior the same day as well as  previous MRI from 06/03/2018 FINDINGS: MRI HEAD FINDINGS Brain: Cerebral volume within normal limits for patient age. Scattered patchy T2/FLAIR hyperintensity within the periventricular and deep white matter both cerebral hemispheres, most consistent with chronic small vessel ischemic disease, mild in nature and stable from previous. Small chronic left cerebellar infarct noted. Additional chronic lacunar infarct noted at the posterior right periventricular white matter. 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 Few additional scattered chronic micro hemorrhages noted within the bilateral cerebral hemispheres, stable from previous. Findings could be related to amyloid angiopathy and/or prior traumatic brain injury. Patchy small volume acute ischemic nonhemorrhagic infarcts seen at the right occipital lobe (series 5, image 20). Largest area of infarction measures 15 mm. Additional punctate posterior right temporal infarct (series 5, image 18). No associated mass effect. No other areas of acute or subacute infarction. Left temporal AVM again noted. No other mass lesion, midline shift or mass effect. No hydrocephalus. No extra-axial fluid collection. 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 Midline structures intact. Vascular: Major intracranial vascular flow voids maintained Skull and upper cervical spine: Craniocervical junction normal. Visualized upper cervical spine within normal limits. Bone marrow signal intensity normal. No scalp soft tissue abnormality. Sinuses/Orbits: Patient status post bilateral ocular lens replacement. Globes and orbital soft tissues demonstrate no acute finding. Paranasal sinuses are clear. No mastoid effusion. Inner ear structures normal. Paranasal sinuses are clear. No mastoid effusion. Inner ear structures normal. Other: None. MRA HEAD FINDINGS ANTERIOR CIRCULATION: Distal cervical segments of the internal carotid arteries are  widely patent with symmetric antegrade flow. Atheromatous irregularity with mild multifocal narrowing at the petrous right ICA, stable. Petrous left ICA widely patent. Cavernous and supraclinoid segments patent without stenosis or other abnormality. A1 segments, anterior communicating artery common anterior cerebral arteries widely patent. M1 segments well perfused and patent bilaterally. Distal MCA branches well perfused and symmetric. Wispy tangle of vessels with shunting into adjacent cortical vein at the left temporal lobe compatible with known AVM, stable in appearance. Ninety symmetric approximately 15 mm. No associated hemorrhage. POSTERIOR CIRCULATION: Vertebral arteries patent to the vertebrobasilar junction without stenosis. Posterior inferior cerebral arteries patent proximally. Basilar patent to its distal aspect without stenosis. Superior cerebral arteries patent bilaterally. Both posterior cerebral arteries widely patent to their distal aspects without stenosis. MRA NECK FINDINGS Source images reviewed. Patent and symmetric antegrade flow seen within the carotid and vertebral arteries bilaterally on time-of-flight sequence. Visualized aortic arch of normal  caliber with normal 3 vessel morphology. No hemodynamically significant stenosis seen about the origin of the great vessels. 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 This is positioned immediately prior to the takeoff of the left vertebral artery. Visualized subclavian arteries otherwise widely patent and normal. Right common carotid artery patent from its origin to the bifurcation without stenosis. No significant atheromatous narrowing about the right bifurcation. Right ICA patent from its origin to the skull base without stenosis, dissection, or occlusion. Left common carotid artery patent from its origin to the  bifurcation without stenosis. Short-segment atheromatous stenosis at Chi origin of the left ICA measures up to 40% by NASCET criteria. Left ICA otherwise widely patent distally to the skull base without stenosis or occlusion. Both of the vertebral arteries arise from the subclavian arteries. Mild tortuosity with mild multifocal narrowing involving the proximal right V2 segment, similar to previous. Vertebral arteries otherwise patent within the neck without stenosis, occlusion, or findings to suggest dissection. IMPRESSION: MRI HEAD IMPRESSION: 1. 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 No associated mass effect. 2. 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 Again, findings can be seen in the setting of amyloid angiopathy or possibly remote traumatic brain injury. 3. 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 MRA HEAD IMPRESSION: 1. No large vessel occlusion or other acute finding. No hemodynamically significant or correctable stenosis. 2. 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 No associated hemorrhage. MRA NECK IMPRESSION: 1. 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 Otherwise wide patency of both carotid artery systems within the neck. 2. Patent vertebral arteries within the neck without hemodynamically significant stenosis. 3. 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 4. No other acute vascular abnormality within the neck. Electronically Signed   By: Jeannine Boga M.D.   On: 08/20/2018 00:44   Mr Brain Wo Contrast  Result Date: 08/20/2018 CLINICAL DATA:  Initial evaluation for acute headache, right eye pain. EXAM: MRI HEAD WITHOUT CONTRAST MRA HEAD WITHOUT CONTRAST MRA  NECK WITHOUT AND WITH CONTRAST TECHNIQUE: Multiplanar, multiecho pulse sequences of the brain and surrounding structures were obtained without intravenous contrast. Angiographic images of the Circle of Willis were obtained using MRA technique without intravenous contrast. Angiographic images of the neck were obtained using MRA technique without and with intravenous contrast. Carotid stenosis measurements (when applicable) are obtained utilizing NASCET criteria, using the distal internal carotid diameter as the denominator. CONTRAST:  8 cc of Gadavist. COMPARISON:  Prior CT from prior the same day as well as previous MRI from 06/03/2018 FINDINGS: MRI HEAD FINDINGS Brain: Cerebral volume within normal limits for patient age. Scattered patchy T2/FLAIR hyperintensity within the periventricular and deep white matter both cerebral hemispheres, most consistent with chronic small vessel ischemic disease, mild in nature and stable from previous. Small chronic left cerebellar infarct noted. Additional chronic lacunar infarct noted at the posterior right periventricular white matter. 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 Few additional scattered chronic micro hemorrhages noted within the bilateral cerebral hemispheres, stable from previous. Findings could be related to amyloid angiopathy and/or prior traumatic brain injury. Patchy small volume acute ischemic nonhemorrhagic infarcts seen at the right occipital lobe (series 5, image 20).  Largest area of infarction measures 15 mm. Additional punctate posterior right temporal infarct (series 5, image 18). No associated mass effect. No other areas of acute or subacute infarction. Left temporal AVM again noted. No other mass lesion, midline shift or mass effect. No hydrocephalus. No extra-axial fluid collection. 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 Midline structures intact. Vascular: Major intracranial vascular flow  voids maintained Skull and upper cervical spine: Craniocervical junction normal. Visualized upper cervical spine within normal limits. Bone marrow signal intensity normal. No scalp soft tissue abnormality. Sinuses/Orbits: Patient status post bilateral ocular lens replacement. Globes and orbital soft tissues demonstrate no acute finding. Paranasal sinuses are clear. No mastoid effusion. Inner ear structures normal. Paranasal sinuses are clear. No mastoid effusion. Inner ear structures normal. Other: None. MRA HEAD FINDINGS ANTERIOR CIRCULATION: Distal cervical segments of the internal carotid arteries are widely patent with symmetric antegrade flow. Atheromatous irregularity with mild multifocal narrowing at the petrous right ICA, stable. Petrous left ICA widely patent. Cavernous and supraclinoid segments patent without stenosis or other abnormality. A1 segments, anterior communicating artery common anterior cerebral arteries widely patent. M1 segments well perfused and patent bilaterally. Distal MCA branches well perfused and symmetric. Wispy tangle of vessels with shunting into adjacent cortical vein at the left temporal lobe compatible with known AVM, stable in appearance. Ninety symmetric approximately 15 mm. No associated hemorrhage. POSTERIOR CIRCULATION: Vertebral arteries patent to the vertebrobasilar junction without stenosis. Posterior inferior cerebral arteries patent proximally. Basilar patent to its distal aspect without stenosis. Superior cerebral arteries patent bilaterally. Both posterior cerebral arteries widely patent to their distal aspects without stenosis. MRA NECK FINDINGS Source images reviewed. Patent and symmetric antegrade flow seen within the carotid and vertebral arteries bilaterally on time-of-flight sequence. Visualized aortic arch of normal caliber with normal 3 vessel morphology. No hemodynamically significant stenosis seen about the origin of the great vessels. Known short-segment  dissection with pseudoaneurysm involving the proximal left subclavian artery measures approximately 14 x 8 mm, not significantly changed from previous. This is positioned immediately prior to the takeoff of the left vertebral artery. Visualized subclavian arteries otherwise widely patent and normal. Right common carotid artery patent from its origin to the bifurcation without stenosis. No significant atheromatous narrowing about the right bifurcation. Right ICA patent from its origin to the skull base without stenosis, dissection, or occlusion. Left common carotid artery patent from its origin to the bifurcation without stenosis. Short-segment atheromatous stenosis at Chi origin of the left ICA measures up to 40% by NASCET criteria. Left ICA otherwise widely patent distally to the skull base without stenosis or occlusion. Both of the vertebral arteries arise from the subclavian arteries. Mild tortuosity with mild multifocal narrowing involving the proximal right V2 segment, similar to previous. Vertebral arteries otherwise patent within the neck without stenosis, occlusion, or findings to suggest dissection. IMPRESSION: MRI HEAD IMPRESSION: 1. 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 No associated mass effect. 2. 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 Again, findings can be seen in the setting of amyloid angiopathy or possibly remote traumatic brain injury. 3. 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 MRA HEAD IMPRESSION: 1. No large vessel occlusion or other acute finding. No hemodynamically significant or correctable stenosis. 2. 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 No associated hemorrhage. MRA NECK IMPRESSION: 1. 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 Otherwise wide  patency of both carotid  artery systems within the neck. 2. Patent vertebral arteries within the neck without hemodynamically significant stenosis. 3. 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 4. No other acute vascular abnormality within the neck. Electronically Signed   By: Jeannine Boga M.D.   On: 08/20/2018 00:44   Mr Jodene Nam Head/brain TJ Cm  Result Date: 08/20/2018 CLINICAL DATA:  Initial evaluation for acute headache, right eye pain. EXAM: MRI HEAD WITHOUT CONTRAST MRA HEAD WITHOUT CONTRAST MRA NECK WITHOUT AND WITH CONTRAST TECHNIQUE: Multiplanar, multiecho pulse sequences of the brain and surrounding structures were obtained without intravenous contrast. Angiographic images of the Circle of Willis were obtained using MRA technique without intravenous contrast. Angiographic images of the neck were obtained using MRA technique without and with intravenous contrast. Carotid stenosis measurements (when applicable) are obtained utilizing NASCET criteria, using the distal internal carotid diameter as the denominator. CONTRAST:  8 cc of Gadavist. COMPARISON:  Prior CT from prior the same day as well as previous MRI from 06/03/2018 FINDINGS: MRI HEAD FINDINGS Brain: Cerebral volume within normal limits for patient age. Scattered patchy T2/FLAIR hyperintensity within the periventricular and deep white matter both cerebral hemispheres, most consistent with chronic small vessel ischemic disease, mild in nature and stable from previous. Small chronic left cerebellar infarct noted. Additional chronic lacunar infarct noted at the posterior right periventricular white matter. 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 Few additional scattered chronic micro hemorrhages noted within the bilateral cerebral hemispheres, stable from previous. Findings could be related to amyloid  angiopathy and/or prior traumatic brain injury. Patchy small volume acute ischemic nonhemorrhagic infarcts seen at the right occipital lobe (series 5, image 20). Largest area of infarction measures 15 mm. Additional punctate posterior right temporal infarct (series 5, image 18). No associated mass effect. No other areas of acute or subacute infarction. Left temporal AVM again noted. No other mass lesion, midline shift or mass effect. No hydrocephalus. No extra-axial fluid collection. 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 Midline structures intact. Vascular: Major intracranial vascular flow voids maintained Skull and upper cervical spine: Craniocervical junction normal. Visualized upper cervical spine within normal limits. Bone marrow signal intensity normal. No scalp soft tissue abnormality. Sinuses/Orbits: Patient status post bilateral ocular lens replacement. Globes and orbital soft tissues demonstrate no acute finding. Paranasal sinuses are clear. No mastoid effusion. Inner ear structures normal. Paranasal sinuses are clear. No mastoid effusion. Inner ear structures normal. Other: None. MRA HEAD FINDINGS ANTERIOR CIRCULATION: Distal cervical segments of the internal carotid arteries are widely patent with symmetric antegrade flow. Atheromatous irregularity with mild multifocal narrowing at the petrous right ICA, stable. Petrous left ICA widely patent. Cavernous and supraclinoid segments patent without stenosis or other abnormality. A1 segments, anterior communicating artery common anterior cerebral arteries widely patent. M1 segments well perfused and patent bilaterally. Distal MCA branches well perfused and symmetric. Wispy tangle of vessels with shunting into adjacent cortical vein at the left temporal lobe compatible with known AVM, stable in appearance. Ninety symmetric approximately 15 mm. No associated hemorrhage. POSTERIOR CIRCULATION: Vertebral arteries patent to the  vertebrobasilar junction without stenosis. Posterior inferior cerebral arteries patent proximally. Basilar patent to its distal aspect without stenosis. Superior cerebral arteries patent bilaterally. Both posterior cerebral arteries widely patent to their distal aspects without stenosis. MRA NECK FINDINGS Source images reviewed. Patent and symmetric antegrade flow seen within  the carotid and vertebral arteries bilaterally on time-of-flight sequence. Visualized aortic arch of normal caliber with normal 3 vessel morphology. No hemodynamically significant stenosis seen about the origin of the great vessels. 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 This is positioned immediately prior to the takeoff of the left vertebral artery. Visualized subclavian arteries otherwise widely patent and normal. Right common carotid artery patent from its origin to the bifurcation without stenosis. No significant atheromatous narrowing about the right bifurcation. Right ICA patent from its origin to the skull base without stenosis, dissection, or occlusion. Left common carotid artery patent from its origin to the bifurcation without stenosis. Short-segment atheromatous stenosis at Chi origin of the left ICA measures up to 40% by NASCET criteria. Left ICA otherwise widely patent distally to the skull base without stenosis or occlusion. Both of the vertebral arteries arise from the subclavian arteries. Mild tortuosity with mild multifocal narrowing involving the proximal right V2 segment, similar to previous. Vertebral arteries otherwise patent within the neck without stenosis, occlusion, or findings to suggest dissection. IMPRESSION: MRI HEAD IMPRESSION: 1. 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 No associated mass effect. 2. 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 Again, findings can be seen in the setting of amyloid angiopathy or possibly remote traumatic brain injury. 3. 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 MRA HEAD IMPRESSION: 1. No large vessel occlusion or other acute finding. No hemodynamically significant or correctable stenosis. 2. 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 No associated hemorrhage. MRA NECK IMPRESSION: 1. 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 Otherwise wide patency of both carotid artery systems within the neck. 2. Patent vertebral arteries within the neck without hemodynamically significant stenosis. 3. 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 4. No other acute vascular abnormality within the neck. Electronically Signed   By: Jeannine Boga M.D.   On: 08/20/2018 00:44    EKG:   Orders placed or performed during the hospital encounter of 08/19/18  . ED EKG  . ED EKG  . EKG 12-Lead  . EKG 12-Lead      Management plans discussed with the patient, family and they are in agreement.  CODE STATUS:     Code Status Orders  (From admission, onward)         Start     Ordered   08/19/18 2209  Full code  Continuous     08/19/18 2208        Code Status History    Date Active Date Inactive Code Status Order ID Comments User Context   06/03/2018 0128 06/05/2018 1757 Full Code [id number]  Lance Coon, MD Inpatient   07/30/2017 1703 08/01/2017 1937 Full Code [id number]  Epifanio Lesches, MD ED      TOTAL TIME TAKING CARE OF THIS PATIENT: 40 minutes.    Avel Peace Salary M.D on 08/21/2018 at 10:02 AM  Between 7am to 6pm - Pager - [phone]  After 6pm go to [email or web address] - password [email or web address] [place]  [facility]  Office  [phone]  CC: Primary care physician; Idelle Crouch, MD   Note: This dictation was prepared with Dragon dictation along with smaller phrase technology. Any transcriptional  errors that result from this process are unintentional.

## 2018-08-21 NOTE — Progress Notes (Signed)
SLP Cancellation Note  Patient Details Name: Jon [name] MRN: [id number] DOB: 1951-04-17   Cancelled treatment:       Reason Eval/Treat Not Completed: SLP screened, no needs identified, will sign off(chart reviewed; consulted NSG re: pt's status then met w/ pt). Pt denied any difficulty swallowing and is currently on a regular diet; tolerates swallowing pills w/ water per NSG. Pt conversed at conversational level w/ OT during session (and followed all commands) w/out deficits noted; pt and NSG denied any speech-language deficits.  No further skilled ST services indicated as pt appears at her baseline. Pt agreed. NSG to reconsult if any change in status while admitted.    Orinda Kenner, MS, CCC-SLP Emaya Preston 08/21/2018, 10:14 AM

## 2018-08-21 NOTE — Plan of Care (Signed)
Problem: Education: Goal: Knowledge of disease or condition will improve 08/21/2018 0012 by Herbie Baltimore, RN Outcome: Progressing 08/21/2018 0012 by Herbie Baltimore, RN Outcome: Progressing Goal: Knowledge of secondary prevention will improve 08/21/2018 0012 by Herbie Baltimore, RN Outcome: Progressing 08/21/2018 0012 by Herbie Baltimore, RN Outcome: Progressing Goal: Knowledge of patient specific risk factors addressed and post discharge goals established will improve 08/21/2018 0012 by Herbie Baltimore, RN Outcome: Progressing 08/21/2018 0012 by Herbie Baltimore, RN Outcome: Progressing   Problem: Coping: Goal: Will verbalize positive feelings about self 08/21/2018 0012 by Herbie Baltimore, RN Outcome: Progressing 08/21/2018 0012 by Herbie Baltimore, RN Outcome: Progressing Goal: Will identify appropriate support needs 08/21/2018 0012 by Herbie Baltimore, RN Outcome: Progressing 08/21/2018 0012 by Herbie Baltimore, RN Outcome: Progressing   Problem: Health Behavior/Discharge Planning: Goal: Ability to manage health-related needs will improve 08/21/2018 0012 by Herbie Baltimore, RN Outcome: Progressing 08/21/2018 0012 by Herbie Baltimore, RN Outcome: Progressing   Problem: Self-Care: Goal: Ability to participate in self-care as condition permits will improve 08/21/2018 0012 by Herbie Baltimore, RN Outcome: Progressing 08/21/2018 0012 by Herbie Baltimore, RN Outcome: Progressing Goal: Verbalization of feelings and concerns over difficulty with self-care will improve 08/21/2018 0012 by Herbie Baltimore, RN Outcome: Progressing 08/21/2018 0012 by Herbie Baltimore, RN Outcome: Progressing Goal: Ability to communicate needs accurately will improve 08/21/2018 0012 by Herbie Baltimore, RN Outcome: Progressing 08/21/2018 0012 by Herbie Baltimore, RN Outcome: Progressing   Problem: Nutrition: Goal: Risk of  aspiration will decrease 08/21/2018 0012 by Herbie Baltimore, RN Outcome: Progressing 08/21/2018 0012 by Herbie Baltimore, RN Outcome: Progressing   Problem: Education: Goal: Knowledge of General Education information will improve Description Including pain rating scale, medication(s)/side effects and non-pharmacologic comfort measures 08/21/2018 0012 by Herbie Baltimore, RN Outcome: Progressing 08/21/2018 0012 by Herbie Baltimore, RN Outcome: Progressing   Problem: Health Behavior/Discharge Planning: Goal: Ability to manage health-related needs will improve 08/21/2018 0012 by Herbie Baltimore, RN Outcome: Progressing 08/21/2018 0012 by Herbie Baltimore, RN Outcome: Progressing   Problem: Clinical Measurements: Goal: Ability to maintain clinical measurements within normal limits will improve 08/21/2018 0012 by Herbie Baltimore, RN Outcome: Progressing 08/21/2018 0012 by Herbie Baltimore, RN Outcome: Progressing Goal: Will remain free from infection 08/21/2018 0012 by Herbie Baltimore, RN Outcome: Progressing 08/21/2018 0012 by Herbie Baltimore, RN Outcome: Progressing Goal: Diagnostic test results will improve 08/21/2018 0012 by Herbie Baltimore, RN Outcome: Progressing 08/21/2018 0012 by Herbie Baltimore, RN Outcome: Progressing Goal: Respiratory complications will improve 08/21/2018 0012 by Herbie Baltimore, RN Outcome: Progressing 08/21/2018 0012 by Herbie Baltimore, RN Outcome: Progressing Goal: Cardiovascular complication will be avoided 08/21/2018 0012 by Herbie Baltimore, RN Outcome: Progressing 08/21/2018 0012 by Herbie Baltimore, RN Outcome: Progressing   Problem: Activity: Goal: Risk for activity intolerance will decrease 08/21/2018 0012 by Herbie Baltimore, RN Outcome: Progressing 08/21/2018 0012 by Herbie Baltimore, RN Outcome: Progressing   Problem: Nutrition: Goal: Adequate nutrition will be  maintained 08/21/2018 0012 by Herbie Baltimore, RN Outcome: Progressing 08/21/2018 0012 by Herbie Baltimore, RN Outcome: Progressing   Problem: Coping: Goal: Level of anxiety will decrease 08/21/2018 0012 by Herbie Baltimore, RN Outcome: Progressing 08/21/2018 0012 by Herbie Baltimore, RN Outcome: Progressing   Problem: Elimination: Goal: Will not experience complications related to bowel motility 08/21/2018 0012 by Herbie Baltimore, RN Outcome: Progressing 08/21/2018  0012 by Herbie Baltimore, RN Outcome: Progressing Goal: Will not experience complications related to urinary retention 08/21/2018 0012 by Herbie Baltimore, RN Outcome: Progressing 08/21/2018 0012 by Herbie Baltimore, RN Outcome: Progressing   Problem: Pain Managment: Goal: General experience of comfort will improve 08/21/2018 0012 by Herbie Baltimore, RN Outcome: Progressing 08/21/2018 0012 by Herbie Baltimore, RN Outcome: Progressing   Problem: Safety: Goal: Ability to remain free from injury will improve 08/21/2018 0012 by Herbie Baltimore, RN Outcome: Progressing 08/21/2018 0012 by Herbie Baltimore, RN Outcome: Progressing   Problem: Skin Integrity: Goal: Risk for impaired skin integrity will decrease 08/21/2018 0012 by Herbie Baltimore, RN Outcome: Progressing 08/21/2018 0012 by Herbie Baltimore, RN Outcome: Progressing

## 2018-08-21 NOTE — Evaluation (Signed)
Occupational Therapy Evaluation Patient Details Name: Jon [name] MRN: [id number] DOB: November 13, 1950 Today's Date: 08/21/2018    History of Present Illness  68 y.o. 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 He describes Homonomous Hemianopia.  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 Pt lives in a 1 story home with 6 steps to enter with handrails, tub/shower, and no AE in the home aside from a 2WW and SPC. Pt was independent with mobility, ADL, and IADL prior to admission but endorses 3-4 falls in the past year. Pt currently presents with L visual field deficits (<50% vision loss on L side in B eyes) which can impact his functional mobility, safety, and ability to perform ADL and IADL tasks at [facility]. Pt educated in symptoms and compensatory strategies for homonymous hemianopsia including visual tracking, visual scanning exercises, safety awareness, falls prevention, navigating the environment, and community mobility. Handout provided. Pt verbalized understanding, but would benefit from further skilled OT for instruction in compensatory strategies for mgt of L visual field loss while in the hospital setting in order to improve safety and functional return.  Pt at supervision level for mobility during assessment, but endorses some concerns over falling 2/2 visual field deficits. Pt with strength and ROM WFL, intact sensation and coordination, and no neglect to L side upon assessment. Baseline independence with ADL tasks. Upon hospital DC, encouraged pt to seek OP OT services should he return home and experience difficulties with his daily routine that limit his functional independence.       Follow Up Recommendations  Outpatient OT    Equipment Recommendations  (TBD)     Recommendations for Other Services       Precautions / Restrictions Precautions Precautions: Fall Restrictions Weight Bearing Restrictions: No      Mobility Bed Mobility Overal bed mobility: Independent             General bed mobility comments: Pt easily gets to EOB w/o assist  Transfers Overall transfer level: Independent Equipment used: None             General transfer comment: Rises to standing with confidence and w/o issue    Balance Overall balance assessment: Mild deficits observed, not formally tested                                         ADL either performed or assessed with clinical judgement   ADL Overall ADL's : Needs assistance/impaired                                       General ADL Comments: Pt endorsed independently dressing, using his room bathroom, etc. during admission. Endorses that visual changes have caused him to move much more slowly than usual and feel as though he could lose balance when making suddem movements/positional changes endorsed concerns over falling with functional mobility.      Vision Baseline Vision/History: Wears glasses Wears Glasses: Reading only Patient Visual Report: Other (comment)(Endorses symptoms consistent with L HH. ) Vision Assessment?: Yes Eye Alignment: Impaired (comment)(Pt states words and pictures  appear "pixelated" at first glance, then in order to see he must turn head to use his R eye. Endorsed the left half of words on his room info board appear incomplete or missing all togehter. ) Ocular Range of Motion: Within Functional Limits Alignment/Gaze Preference: Within Defined Limits Tracking/Visual Pursuits: Able to track stimulus in all quads without difficulty Saccades: Decreased speed of saccadic movement Convergence: Impaired - to be further tested in functional context(Decreased occular motor response noted with convegence. Both eyes remained straight ahead,  did not turn toward midline. ) Visual Fields: Left homonymous hemianopsia Additional Comments: Pt endorses visual field loss improves when he is able to bring stimuli close to himself and turn his head to use R Eye.      Perception     Praxis      Pertinent Vitals/Pain Pain Assessment: 0-10 Pain Score: 6  Pain Location: Headache Pain Descriptors / Indicators: Aching Pain Intervention(s): Limited activity within patient's tolerance;Monitored during session;Premedicated before session     Hand Dominance Right   Extremity/Trunk Assessment Upper Extremity Assessment Upper Extremity Assessment: Overall WFL for tasks assessed   Lower Extremity Assessment Lower Extremity Assessment: Overall WFL for tasks assessed;LLE deficits/detail LLE Deficits / Details: Pt endorses (and demonstrates) slight LLE weakness WFL.        Communication Communication Communication: No difficulties   Cognition Arousal/Alertness: Awake/alert Behavior During Therapy: WFL for tasks assessed/performed Overall Cognitive Status: Within Functional Limits for tasks assessed                                     General Comments  Pt endorses vision may be impacting his balance. Will continue to monitor.     Exercises Other Exercises Other Exercises: Pt educated in compensatory strategies for mgt of left visual field deficits. Handout on homonymous hemianopsia left with pt.  Other Exercises: Pt educated on falls prevention strategies for improved safety and functional mobility upon hospital DC.   Shoulder Instructions      Home Living Family/patient expects to be discharged to:: Private residence Living Arrangements: Spouse/significant other Available Help at Discharge: Family;Available PRN/intermittently Type of Home: House Home Access: Stairs to enter [employer] of Steps: 6 steps + bilat rails from side entrance (primary); 5+1 steps for front entrance Entrance Stairs-Rails:  Right;Left;Can reach both Home Layout: One level     Bathroom Shower/Tub: Tub/shower unit;Curtain   Biochemist, clinical: Standard Bathroom Accessibility: Yes How Accessible: Accessible via walker Home Equipment: [place] - 2 wheels;Cane - single point          Prior Functioning/Environment Level of Independence: Independent        Comments: Pt was independent with all ADLs/IADLs including driving and housework        OT Problem List: Impaired balance (sitting and/or standing);Impaired vision/perception;Decreased safety awareness;Decreased coordination;Decreased activity tolerance;Decreased knowledge of use of DME or AE      OT Treatment/Interventions: Self-care/ADL training;Visual/perceptual remediation/compensation;Therapeutic exercise;Therapeutic activities;Balance training;DME and/or AE instruction    OT Goals(Current goals can be found in the care plan section) Acute Rehab OT Goals Patient Stated Goal: To go home and be independent.  OT Goal Formulation: With patient Time For Goal Achievement: 09/04/18 Potential to Achieve Goals: Good ADL Goals Additional ADL Goal #1: Pt will independently educate a caregiver on compensatory strategies for mgt of L visual field loss for improved safety and functional return to meaningful occupations of daily life upon [place].  Additional ADL Goal #2: Pt will independently verbalize a plan to implement at least 3 learned falls prevention strategies into his daily routines/home environment for improved safety and return to meaningful occupations of daily life upon hospital DC.  OT Frequency: Min 1X/week   Barriers to D/C:            Co-evaluation              AM-PAC OT "6 Clicks" Daily Activity     Outcome Measure Help from another person eating meals?: None Help from another person taking care of personal grooming?: None Help from another person toileting, which includes using toliet, bedpan, or urinal?: None Help from another  person bathing (including washing, rinsing, drying)?: None Help from another person to put on and taking off regular upper body clothing?: None Help from another person to put on and taking off regular lower body clothing?: None 6 Click Score: 24   End of Session Nurse Communication: Other (comment)(Pt refused bed alarm. )  Activity Tolerance: Patient tolerated treatment well Patient left: in bed;with call bell/phone within reach(Pt refused bed alarm. )  OT Visit Diagnosis: History of falling (Z91.81);Other abnormalities of gait and mobility (R26.89)                Time: 1694-5038 OT Time Calculation (min): 33 min Charges:  OT General Charges $OT Visit: 1 Visit OT Evaluation $OT Eval Moderate Complexity: 1 Mod OT Treatments $Self Care/Home Management : 23-37 mins  Shara Blazing, M.S., OTR/L Ascom: [phone] 08/21/18, 11:51 AM

## 2018-08-22 LAB — HEMOGLOBIN A1C
Hgb A1c MFr Bld: 7.5 % — ABNORMAL HIGH (ref 4.8–5.6)
Mean Plasma Glucose: 169 mg/dL

## 2018-09-06 DIAGNOSIS — I618 Other nontraumatic intracerebral hemorrhage: Secondary | ICD-10-CM | POA: Insufficient documentation

## 2018-11-29 DIAGNOSIS — Z8673 Personal history of transient ischemic attack (TIA), and cerebral infarction without residual deficits: Secondary | ICD-10-CM | POA: Insufficient documentation

## 2018-11-29 DIAGNOSIS — Z86010 Personal history of colonic polyps: Secondary | ICD-10-CM | POA: Insufficient documentation

## 2018-12-25 ENCOUNTER — Emergency Department: Payer: Medicare Other

## 2018-12-25 ENCOUNTER — Other Ambulatory Visit: Payer: Self-pay

## 2018-12-25 ENCOUNTER — Emergency Department
Admission: EM | Admit: 2018-12-25 | Discharge: 2018-12-25 | Disposition: A | Discharge status: Home / Self Care | Payer: Medicare Other | Attending: Student in an Organized Health Care Education/Training Program | Admitting: Student in an Organized Health Care Education/Training Program

## 2018-12-25 DIAGNOSIS — R51 Headache: Secondary | ICD-10-CM | POA: Diagnosis not present

## 2018-12-25 DIAGNOSIS — I10 Essential (primary) hypertension: Secondary | ICD-10-CM | POA: Diagnosis not present

## 2018-12-25 DIAGNOSIS — R531 Weakness: Secondary | ICD-10-CM

## 2018-12-25 DIAGNOSIS — Z79899 Other long term (current) drug therapy: Secondary | ICD-10-CM | POA: Diagnosis not present

## 2018-12-25 DIAGNOSIS — Z7984 Long term (current) use of oral hypoglycemic drugs: Secondary | ICD-10-CM | POA: Diagnosis not present

## 2018-12-25 DIAGNOSIS — Z7982 Long term (current) use of aspirin: Secondary | ICD-10-CM | POA: Diagnosis not present

## 2018-12-25 DIAGNOSIS — E119 Type 2 diabetes mellitus without complications: Secondary | ICD-10-CM | POA: Diagnosis not present

## 2018-12-25 DIAGNOSIS — Z20828 Contact with and (suspected) exposure to other viral communicable diseases: Secondary | ICD-10-CM | POA: Diagnosis not present

## 2018-12-25 DIAGNOSIS — R519 Headache, unspecified: Secondary | ICD-10-CM

## 2018-12-25 LAB — BASIC METABOLIC PANEL
Anion gap: 10 (ref 5–15)
BUN: 13 mg/dL (ref 8–23)
CO2: 25 mmol/L (ref 22–32)
Calcium: 8.8 mg/dL — ABNORMAL LOW (ref 8.9–10.3)
Chloride: 99 mmol/L (ref 98–111)
Creatinine, Ser: 1 mg/dL (ref 0.61–1.24)
GFR calc Af Amer: >60 mL/min (ref >60)
GFR calc non Af Amer: >60 mL/min (ref >60)
Glucose, Bld: 218 mg/dL — ABNORMAL HIGH (ref 70–99)
Potassium: 4 mmol/L (ref 3.5–5.1)
Sodium: 134 mmol/L — ABNORMAL LOW (ref 135–145)

## 2018-12-25 LAB — URINALYSIS, COMPLETE (UACMP) WITH MICROSCOPIC
Bilirubin Urine: NEGATIVE
Glucose, UA: NEGATIVE mg/dL
Hgb urine dipstick: NEGATIVE
Ketones, ur: NEGATIVE mg/dL
Leukocytes,Ua: NEGATIVE
Nitrite: NEGATIVE
Protein, ur: NEGATIVE mg/dL
Specific Gravity, Urine: 1.008 (ref 1.005–1.030)
pH: 6 (ref 5.0–8.0)

## 2018-12-25 LAB — CBC
HCT: 42.9 % (ref 39.0–52.0)
Hemoglobin: 14.4 g/dL (ref 13.0–17.0)
MCH: 31.6 pg (ref 26.0–34.0)
MCHC: 33.6 g/dL (ref 30.0–36.0)
MCV: 94.1 fL (ref 80.0–100.0)
Platelets: 197 10*3/uL (ref 150–400)
RBC: 4.56 MIL/uL (ref 4.22–5.81)
RDW: 12.8 % (ref 11.5–15.5)
WBC: 4.7 10*3/uL (ref 4.0–10.5)
nRBC: 0 % (ref 0.0–0.2)

## 2018-12-25 LAB — SARS CORONAVIRUS 2 BY RT PCR (HOSPITAL ORDER, PERFORMED IN ~~LOC~~ HOSPITAL LAB): SARS Coronavirus 2: NEGATIVE

## 2018-12-25 LAB — GLUCOSE, CAPILLARY: Glucose-Capillary: 235 mg/dL — ABNORMAL HIGH (ref 70–99)

## 2018-12-25 MED ORDER — PROCHLORPERAZINE EDISYLATE 10 MG/2ML IJ SOLN: 10.0000 mg | Freq: Once | INTRAMUSCULAR | Status: DC

## 2018-12-25 MED ORDER — PROCHLORPERAZINE EDISYLATE 10 MG/2ML IJ SOLN
5.0000 mg | Freq: Once | INTRAMUSCULAR | Status: AC
  Administered 2018-12-25: 5 mg via INTRAVENOUS
  Filled 2018-12-25: qty 2

## 2018-12-25 MED ORDER — ACETAMINOPHEN 500 MG PO TABS
1000.0000 mg | ORAL_TABLET | Freq: Once | ORAL | Status: AC
  Administered 2018-12-25: 1000 mg via ORAL
  Filled 2018-12-25: qty 2

## 2018-12-25 MED ORDER — SODIUM CHLORIDE 0.9% FLUSH: 3.0000 mL | Freq: Once | INTRAVENOUS | Status: DC

## 2018-12-25 NOTE — ED Provider Notes (Signed)
[facility] Emergency Department Provider Note    First MD Initiated Contact with Patient 12/25/18 1505     (approximate)  I have reviewed the triage vital signs and the nursing notes.   HISTORY  Chief Complaint Weakness    HPI Jon [name] is a 68 y.o. male below listed past medical history as well as a history of hemiplegic migraines presents the ER for evaluation of difficulty walking as well as left-sided headache since Saturday.  States he has also had some generalized weakness and tingling in the left arm.  States has been taking Fioricet at home without any improvement.  Followed up with his PCP today but due to his symptoms was sent to the ER for further evaluation.  Denies any chest pain or shortness of breath.  Does have nausea.    Past Medical History:  Diagnosis Date  . Diabetes mellitus without complication ([place])   . Hypertension   . Pituitary tumor   . TIA (transient ischemic attack)    No family history on file. Past Surgical History:  Procedure Laterality Date  . COLONOSCOPY    . EYE SURGERY    . HERNIA REPAIR     Patient Active Problem List   Diagnosis Date Noted  . Occipital stroke ([place]) 08/19/2018  . Left-sided weakness 06/02/2018  . HTN (hypertension) 06/02/2018  . Diabetes ([place]) 06/02/2018  . GERD (gastroesophageal reflux disease) 06/02/2018  . Headache 07/30/2017      Prior to Admission medications   Medication Sig Start Date End Date Taking? Authorizing Provider  ARIPiprazole (ABILIFY) 2 MG tablet Take 2 mg by mouth daily.    [provider]  aspirin 81 MG chewable tablet Chew 1 tablet (81 mg total) by mouth daily. 08/22/18   Salary, Avel Peace, MD  atorvastatin (LIPITOR) 40 MG tablet Take 1 tablet (40 mg total) by mouth daily at 6 PM. 08/21/18   Salary, Avel Peace, MD  butalbital-acetaminophen-caffeine (FIORICET, ESGIC) 50-325-40 MG tablet Take 1 tablet by mouth every 4 (four) hours as needed for headache or  migraine. 08/21/18   Salary, Avel Peace, MD  carvedilol (COREG) 25 MG tablet Take 25 mg by mouth 2 (two) times daily with a meal.    [provider]  Cholecalciferol (D3-1000) 1000 units tablet Take 1,000 Units by mouth daily.    [provider]  DULoxetine (CYMBALTA) 60 MG capsule Take 60 mg by mouth daily.    [provider]  gabapentin (NEURONTIN) 300 MG capsule Take 600 mg by mouth 2 (two) times daily.     [provider]  glimepiride (AMARYL) 2 MG tablet Take 2 mg by mouth 2 (two) times daily.  06/20/17   [provider]  hydrOXYzine (ATARAX/VISTARIL) 10 MG tablet Take 1 tablet by mouth 3 (three) times daily as needed for itching.  07/01/17   [provider]  ibuprofen (ADVIL,MOTRIN) 200 MG tablet Take 200 mg by mouth every 6 (six) hours as needed for mild pain.    [provider]  losartan-hydrochlorothiazide (HYZAAR) 100-12.5 MG tablet Take 1 tablet by mouth daily.    [provider]  pantoprazole (PROTONIX) 40 MG tablet Take 40 mg by mouth daily. 06/27/17   [provider]  testosterone cypionate (DEPOTESTOSTERONE CYPIONATE) 200 MG/ML injection Inject 200 mg into the muscle every 14 (fourteen) days. 07/29/17   [provider]  traZODone (DESYREL) 50 MG tablet Take 50 mg by mouth at bedtime.     [provider]  vitamin B-12 (CYANOCOBALAMIN) 1000 MCG tablet Take 1,000 mcg by mouth daily.    [provider]    Allergies Beta adrenergic blockers, Topiramate, Aspirin, Benadryl [diphenhydramine], Lisinopril, Nabumetone, and Valproic acid    Social History Social History   Tobacco Use  . Smoking status: Never Smoker  . Smokeless tobacco: Never Used  Substance Use Topics  . Alcohol use: Never    Frequency: Never  . Drug use: Never    Review of Systems Patient denies headaches, rhinorrhea, blurry vision, numbness, shortness of breath, chest pain, edema, cough, abdominal pain, nausea,  vomiting, diarrhea, dysuria, fevers, rashes or hallucinations unless otherwise stated above in HPI. ____________________________________________   PHYSICAL EXAM:  VITAL SIGNS: Vitals:   12/25/18 1730 12/25/18 1852  BP: 131/87 122/84  Pulse: 71 81  Resp: 11 16  Temp:    SpO2: 96% 98%    Constitutional: Alert and oriented.  Eyes: Conjunctivae are normal.  Head: Atraumatic. Nose: No congestion/rhinnorhea. Mouth/Throat: Mucous membranes are moist.   Neck: No stridor. Painless ROM.  Cardiovascular: Normal rate, regular rhythm. Grossly normal heart sounds.  Good peripheral circulation. Respiratory: Normal respiratory effort.  No retractions. Lungs CTAB. Gastrointestinal: Soft and nontender. No distention. No abdominal bruits. No CVA tenderness. Genitourinary:  Musculoskeletal: No lower extremity tenderness nor edema.  No joint effusions. Neurologic:  CN- intact.  No facial droop, Normal FNF.  Normal heel to shin.  Sensation intact bilaterally. Normal speech and language. No gross focal neurologic deficits are appreciated. No gait instability.  Skin:  Skin is warm, dry and intact. No rash noted. Psychiatric: Mood and affect are normal. Speech and behavior are normal.  ____________________________________________   LABS (all labs ordered are listed, but only abnormal results are displayed)  Results for orders placed or performed during the hospital encounter of 12/25/18 (from the past 24 hour(s))  Glucose, capillary     Status: Abnormal   Collection Time: 12/25/18 12:32 PM  Result Value Ref Range   Glucose-Capillary 235 (H) 70 - 99 mg/dL  Basic metabolic panel     Status: Abnormal   Collection Time: 12/25/18 12:38 PM  Result Value Ref Range   Sodium 134 (L) 135 - 145 mmol/L   Potassium 4.0 3.5 - 5.1 mmol/L   Chloride 99 98 - 111 mmol/L   CO2 25 22 - 32 mmol/L   Glucose, Bld 218 (H) 70 - 99 mg/dL   BUN 13 8 - 23 mg/dL   Creatinine, Ser 1.00 0.61 - 1.24 mg/dL   Calcium 8.8  (L) 8.9 - 10.3 mg/dL   GFR calc non Af Amer >60 >60 mL/min   GFR calc Af Amer >60 >60 mL/min   Anion gap 10 5 - 15  CBC     Status: None   Collection Time: 12/25/18 12:38 PM  Result Value Ref Range   WBC 4.7 4.0 - 10.5 K/uL   RBC 4.56 4.22 - 5.81 MIL/uL   Hemoglobin 14.4 13.0 - 17.0 g/dL   HCT 42.9 39.0 - 52.0 %   MCV 94.1 80.0 - 100.0 fL   MCH 31.6 26.0 - 34.0 pg   MCHC 33.6 30.0 - 36.0 g/dL   RDW 12.8 11.5 - 15.5 %   Platelets 197 150 - 400 K/uL   nRBC 0.0 0.0 - 0.2 %  Urinalysis, Complete w Microscopic     Status: Abnormal   Collection Time: 12/25/18  4:25 PM  Result Value Ref Range   Color, Urine YELLOW (A) YELLOW  APPearance CLEAR (A) CLEAR   Specific Gravity, Urine 1.008 1.005 - 1.030   pH 6.0 5.0 - 8.0   Glucose, UA NEGATIVE NEGATIVE mg/dL   Hgb urine dipstick NEGATIVE NEGATIVE   Bilirubin Urine NEGATIVE NEGATIVE   Ketones, ur NEGATIVE NEGATIVE mg/dL   Protein, ur NEGATIVE NEGATIVE mg/dL   Nitrite NEGATIVE NEGATIVE   Leukocytes,Ua NEGATIVE NEGATIVE  SARS Coronavirus 2 [facility] order, Performed in [place] hospital lab) Nasopharyngeal Nasopharyngeal Swab     Status: None   Collection Time: 12/25/18  4:25 PM   Specimen: Nasopharyngeal Swab  Result Value Ref Range   SARS Coronavirus 2 NEGATIVE NEGATIVE   ____________________________________________  EKG My review and personal interpretation at Time: 12:42   Indication: headache  Rate: 90  Rhythm: sinus Axis: normal Other: normal intervals, no stemi ____________________________________________  RADIOLOGY  I personally reviewed all radiographic images ordered to evaluate for the above acute complaints and reviewed radiology reports and findings.  These findings were personally discussed with the patient.  Please see medical record for radiology report.  ____________________________________________   PROCEDURES  Procedure(s) performed:  Procedures    Critical Care performed: no  ____________________________________________   INITIAL IMPRESSION / ASSESSMENT AND PLAN / ED COURSE  Pertinent labs & imaging results that were available during my care of the patient were reviewed by me and considered in my medical decision making (see chart for details).   DDX: Vertigo, migraine, CVA, bleed, dehydration, hemiplegic migraine  Jon [name] is a 68 y.o. who presents to the ED with symptoms as described above.  Patient pleasant nontoxic-appearing.  Does not have any focal deficits at this time.  CT imaging does not show any evidence of acute abnormality I do suspect complex migraine.  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  Given his recurrent headache with new weakness will repeat MRI to evaluate for acute stroke.  Have lower suspicion for bleed.  Will treat for hemiplegic migraine and reassess.   [PR]  1909 Patient feels significantly improved.  No headache.  He is able to ambulate with steady gait.  MRI does not show any evidence of acute stroke.  Does have sequela from previous presentations.  Discussed option for observation in the hospital for neurology consult versus outpatient follow-up and patient prefer outpatient follow-up.  Given symptoms suggesting complex migraine now back to baseline with outpatient follow-up with it that is reasonable.  We discussed signs and symptoms for which he should return to the ER.   [PR]    Clinical Course User Index [PR] Merlyn Lot, MD    The patient was evaluated in Emergency Department today for the symptoms described in the history of present illness. He/she was evaluated in the context of the global COVID-19 pandemic, which necessitated consideration that the patient might be at risk for infection with the SARS-CoV-2 virus that causes COVID-19. Institutional protocols and algorithms that pertain to the  evaluation of patients at risk for COVID-19 are in a state of rapid change based on information released by regulatory bodies including the CDC and federal and state organizations. These policies and algorithms were followed during the patient's care in the ED.  As part of my medical decision making, I reviewed the following data within the [place] notes reviewed and incorporated, Labs reviewed, notes from prior ED visits  and [place] Controlled Substance Database   ____________________________________________   FINAL CLINICAL IMPRESSION(S) / ED DIAGNOSES  Final diagnoses:  Acute nonintractable headache, unspecified headache type  Weakness      NEW MEDICATIONS STARTED DURING THIS VISIT:  New Prescriptions   No medications on file     Note:  This document was prepared using Dragon voice recognition software and may include unintentional dictation errors.    Merlyn Lot, MD 12/25/18 [phone]

## 2018-12-25 NOTE — ED Notes (Signed)
PT given urinal and encouraged to attempt to urinate

## 2018-12-25 NOTE — Discharge Instructions (Signed)

## 2018-12-25 NOTE — ED Notes (Signed)
Pt ambulated in room. Pt intially was unsteady but ambulated well without assistance. Support person in room told this NT that his ambulation was a huge improvement from before. RN made aware.

## 2018-12-25 NOTE — ED Notes (Signed)
PT used restroom while this RN was in another room. PT informed of need of urine sample and reminded of call bell.

## 2018-12-25 NOTE — ED Triage Notes (Signed)
Pt sent from PCP with c/o generalized weakness, difficulty ambulating , left sided HA and left arm tingling since Saturday, pt has a hx of stroke,

## 2019-01-03 ENCOUNTER — Encounter: Payer: Self-pay | Admitting: Emergency Medicine

## 2019-01-03 ENCOUNTER — Emergency Department: Payer: Medicare Other

## 2019-01-03 ENCOUNTER — Other Ambulatory Visit: Payer: Self-pay

## 2019-01-03 ENCOUNTER — Emergency Department
Admission: EM | Admit: 2019-01-03 | Discharge: 2019-01-03 | Disposition: A | Discharge status: Home / Self Care | Payer: Medicare Other | Attending: Emergency Medicine | Admitting: Emergency Medicine

## 2019-01-03 DIAGNOSIS — K219 Gastro-esophageal reflux disease without esophagitis: Secondary | ICD-10-CM | POA: Insufficient documentation

## 2019-01-03 DIAGNOSIS — R0789 Other chest pain: Secondary | ICD-10-CM | POA: Insufficient documentation

## 2019-01-03 DIAGNOSIS — Z8673 Personal history of transient ischemic attack (TIA), and cerebral infarction without residual deficits: Secondary | ICD-10-CM | POA: Insufficient documentation

## 2019-01-03 DIAGNOSIS — Z79899 Other long term (current) drug therapy: Secondary | ICD-10-CM | POA: Insufficient documentation

## 2019-01-03 DIAGNOSIS — E119 Type 2 diabetes mellitus without complications: Secondary | ICD-10-CM | POA: Diagnosis not present

## 2019-01-03 DIAGNOSIS — I10 Essential (primary) hypertension: Secondary | ICD-10-CM | POA: Diagnosis not present

## 2019-01-03 DIAGNOSIS — Z7982 Long term (current) use of aspirin: Secondary | ICD-10-CM | POA: Insufficient documentation

## 2019-01-03 DIAGNOSIS — R05 Cough: Secondary | ICD-10-CM | POA: Diagnosis not present

## 2019-01-03 LAB — BASIC METABOLIC PANEL
Anion gap: 9 (ref 5–15)
BUN: 16 mg/dL (ref 8–23)
CO2: 28 mmol/L (ref 22–32)
Calcium: 9.1 mg/dL (ref 8.9–10.3)
Chloride: 100 mmol/L (ref 98–111)
Creatinine, Ser: 1.05 mg/dL (ref 0.61–1.24)
GFR calc Af Amer: >60 mL/min (ref >60)
GFR calc non Af Amer: >60 mL/min (ref >60)
Glucose, Bld: 146 mg/dL — ABNORMAL HIGH (ref 70–99)
Potassium: 3.9 mmol/L (ref 3.5–5.1)
Sodium: 137 mmol/L (ref 135–145)

## 2019-01-03 LAB — CBC
HCT: 40.5 % (ref 39.0–52.0)
Hemoglobin: 13.5 g/dL (ref 13.0–17.0)
MCH: 31.9 pg (ref 26.0–34.0)
MCHC: 33.3 g/dL (ref 30.0–36.0)
MCV: 95.7 fL (ref 80.0–100.0)
Platelets: 146 10*3/uL — ABNORMAL LOW (ref 150–400)
RBC: 4.23 MIL/uL (ref 4.22–5.81)
RDW: 13.1 % (ref 11.5–15.5)
WBC: 7 10*3/uL (ref 4.0–10.5)
nRBC: 0 % (ref 0.0–0.2)

## 2019-01-03 LAB — TROPONIN I (HIGH SENSITIVITY): Troponin I (High Sensitivity): 7 ng/L (ref <18)

## 2019-01-03 MED ORDER — SODIUM CHLORIDE 0.9% FLUSH: 3.0000 mL | Freq: Once | INTRAVENOUS | Status: DC

## 2019-01-03 NOTE — ED Notes (Signed)
Sent green and purple tubes to lab. 

## 2019-01-03 NOTE — ED Triage Notes (Signed)
Pt here with c/o worsening reflux over the past few days, "lung pain" and sore throat from cough, Denies known covid exposure, neg test result last week, occasionally hard to breathe. In no distress at this time.

## 2019-01-03 NOTE — ED Provider Notes (Signed)
[facility] Emergency Department Provider Note   ____________________________________________   First MD Initiated Contact with Patient 01/03/19 [phone]     (approximate)  I have reviewed the triage vital signs and the nursing notes.   HISTORY  Chief Complaint Gastroesophageal Reflux, Chest Pain, and Cough    HPI Jon [name] is a 68 y.o. male patient reports ever since he started on the pioglitazone and rosuvastatin he has been having trouble with reflux.  He reports he lays down at night and has to wake up in begins coughing.  He takes stomach acid when he wakes up and he coughs up stuff that taste like stomach acid as well.  He has been on Protonix for quite some time and it is always worked in the past.  Of note Protonix pioglitazone and rosuvastatin are not on his med list.   Patient is not having any difficulty exercising is not short of breath having any chest pain.  Running any fever.     Past Medical History:  Diagnosis Date  . Diabetes mellitus without complication ([place])   . Hypertension   . Pituitary tumor   . TIA (transient ischemic attack)     Patient Active Problem List   Diagnosis Date Noted  . Occipital stroke ([place]) 08/19/2018  . Left-sided weakness 06/02/2018  . HTN (hypertension) 06/02/2018  . Diabetes ([place]) 06/02/2018  . GERD (gastroesophageal reflux disease) 06/02/2018  . Headache 07/30/2017    Past Surgical History:  Procedure Laterality Date  . COLONOSCOPY    . EYE SURGERY    . HERNIA REPAIR      Prior to Admission medications   Medication Sig Start Date End Date Taking? Authorizing Provider  ARIPiprazole (ABILIFY) 2 MG tablet Take 2 mg by mouth daily.    [provider]  aspirin 81 MG chewable tablet Chew 1 tablet (81 mg total) by mouth daily. 08/22/18   Salary, Avel Peace, MD  atorvastatin (LIPITOR) 40 MG tablet Take 1 tablet (40 mg total) by mouth daily at 6 PM. 08/21/18   Salary, Avel Peace, MD   butalbital-acetaminophen-caffeine (FIORICET, ESGIC) 50-325-40 MG tablet Take 1 tablet by mouth every 4 (four) hours as needed for headache or migraine. 08/21/18   Salary, Avel Peace, MD  carvedilol (COREG) 25 MG tablet Take 25 mg by mouth 2 (two) times daily with a meal.    [provider]  Cholecalciferol (D3-1000) 1000 units tablet Take 1,000 Units by mouth daily.    [provider]  DULoxetine (CYMBALTA) 60 MG capsule Take 60 mg by mouth daily.    [provider]  gabapentin (NEURONTIN) 300 MG capsule Take 600 mg by mouth 2 (two) times daily.     [provider]  glimepiride (AMARYL) 2 MG tablet Take 2 mg by mouth 2 (two) times daily.  06/20/17   [provider]  hydrOXYzine (ATARAX/VISTARIL) 10 MG tablet Take 1 tablet by mouth 3 (three) times daily as needed for itching.  07/01/17   [provider]  ibuprofen (ADVIL,MOTRIN) 200 MG tablet Take 200 mg by mouth every 6 (six) hours as needed for mild pain.    [provider]  losartan-hydrochlorothiazide (HYZAAR) 100-12.5 MG tablet Take 1 tablet by mouth daily.    [provider]  pantoprazole (PROTONIX) 40 MG tablet Take 40 mg by mouth daily. 06/27/17   [provider]  testosterone cypionate (DEPOTESTOSTERONE CYPIONATE) 200 MG/ML injection Inject 200 mg into the muscle every 14 (fourteen) days. 07/29/17  [provider]  traZODone (DESYREL) 50 MG tablet Take 50 mg by mouth at bedtime.     [provider]  vitamin B-12 (CYANOCOBALAMIN) 1000 MCG tablet Take 1,000 mcg by mouth daily.    [provider]    Allergies Beta adrenergic blockers, Topiramate, Aspirin, Benadryl [diphenhydramine], Lisinopril, Nabumetone, and Valproic acid  No family history on file.  Social History Social History   Tobacco Use  . Smoking status: Never Smoker  . Smokeless tobacco: Never Used  Substance Use Topics  . Alcohol use: Never    Frequency: Never  . Drug  use: Never    Review of Systems  Constitutional: No fever/chills Eyes: No visual changes. ENT: No sore throat. Cardiovascular: Denies chest pain. Respiratory: Denies shortness of breath. Gastrointestinal: No abdominal pain.  No nausea, no vomiting.  No diarrhea.  No constipation. Genitourinary: Negative for dysuria. Musculoskeletal: Negative for back pain. Skin: Negative for rash. Neurological: Negative for headaches, focal weakness   ____________________________________________   PHYSICAL EXAM:  VITAL SIGNS: ED Triage Vitals  Enc Vitals Group     BP 01/03/19 0833 (!) 155/92     Pulse Rate 01/03/19 0833 79     Resp 01/03/19 0833 19     Temp 01/03/19 0833 98.7 F (37.1 C)     Temp Source 01/03/19 0833 Oral     SpO2 01/03/19 0833 97 %     Weight 01/03/19 0832 183 lb (83 kg)     Height 01/03/19 0832 5\' 6"  (1.676 m)     Head Circumference --      Peak Flow --      Pain Score 01/03/19 0845 5     Pain Loc --      Pain Edu? --      Excl. in [place]? --    Constitutional: Alert and oriented. Well appearing and in no acute distress. Eyes: Conjunctivae are normal. Head: Atraumatic. Nose: No congestion/rhinnorhea. Mouth/Throat: Mucous membranes are moist.  Oropharynx non-erythematous. Neck: No stridor.  Cardiovascular: Normal rate, regular rhythm. Grossly normal heart sounds.  Good peripheral circulation. Respiratory: Normal respiratory effort.  No retractions. Lungs CTAB. Gastrointestinal: Soft and nontender. No distention. No abdominal bruits. No CVA tenderness. Musculoskeletal: No lower extremity tenderness nor edema.   Neurologic:  Normal speech and language. No gross focal neurologic deficits are appreciated. Skin:  Skin is warm, dry and intact. No rash noted. Psychiatric: Mood and affect are normal. Speech and behavior are normal.  ____________________________________________   LABS (all labs ordered are listed, but only abnormal results are displayed)  Labs Reviewed   BASIC METABOLIC PANEL - Abnormal; Notable for the following components:      Result Value   Glucose, Bld 146 (*)    All other components within normal limits  CBC - Abnormal; Notable for the following components:   Platelets 146 (*)    All other components within normal limits  TROPONIN I (HIGH SENSITIVITY)  TROPONIN I (HIGH SENSITIVITY)   ____________________________________________  EKG  Read by me shows normal sinus rhythm at a rate of 83 normal axis essentially normal EKG ____________________________________________  RADIOLOGY  ED MD interpretation: Chest x-ray read by radiology reviewed by me shows no active cardiopulmonary disease  Official radiology report(s): Dg Chest 2 View  Result Date: 01/03/2019 CLINICAL DATA:  Pt here with c/o worsening acid reflux over the past few days, "left lung pain" and sore throat from cough. Denies known covid exposure, neg test result last week, occasionally hard to breathe. Hx  of HTN and diabetes. Non-smoker. EXAM: CHEST - 2 VIEW COMPARISON:  Chest radiographs dated 06/02/2018, 08/02/2017 FINDINGS: Stable cardiomediastinal contours with tortuosity of the thoracic aorta. The lungs are clear. No pneumothorax or pleural effusion. Old right-sided rib fractures. Sclerotic changes are again partially visualized in the proximal left humerus. IMPRESSION: No active cardiopulmonary disease. Electronically Signed   By: Audie Pinto M.D.   On: 01/03/2019 09:34    ____________________________________________   PROCEDURES  Procedure(s) performed (including Critical Care):  Procedures   ____________________________________________   INITIAL IMPRESSION / ASSESSMENT AND PLAN / ED COURSE  Patient's history does sound like worsening reflux.  Review of up-to-date does not show that rosuvastatin or pioglitazone cause reflux.  Patient says he takes 40 Protonix.  We will try adding Gaviscon first if that does not work we can increase the Protonix to 40  twice a day.  Patient will also elevate the head of his bed with some cinderblocks.  He is gotten cinderblocks already.  This does not work we will have him follow-up with Dr. Doy Hutching for possible using erythromycin or Reglan.              ____________________________________________   FINAL CLINICAL IMPRESSION(S) / ED DIAGNOSES  Final diagnoses:  Gastroesophageal reflux disease, esophagitis presence not specified     ED Discharge Orders    None       Note:  This document was prepared using Dragon voice recognition software and may include unintentional dictation errors.    Nena Polio, MD 01/03/19 1100

## 2019-01-03 NOTE — ED Notes (Signed)
Pt states the GERD started earlier this month- pt states he usually takes pantoprazole and it helps but this time it is not- states it feels like it is effecting his left lung

## 2019-01-03 NOTE — Discharge Instructions (Addendum)
It does sound like you have reflux.  Please elevate the head of the bed with the cinderblocks like you were going to.  It is important to elevate the whole bed not just use pillows because sleeping on extra pillows can actually bend you in the middle and put more pressure on your stomach making reflux worse.  Get some Gaviscon and try that an hour or so before bed.  That is a foaming and acid that floats on the stomach acid and comes up first and seems to coat everything.  It may help.  Additionally do not eat large meals right before bed.  Eat a smaller meal 4 - 5 hours before bed.  If all of that does not work you can increase the Protonix to 40 mg twice a day.  If that does not work follow-up with Dr. Doy Hutching or return here.  Also return here for any different symptoms or chest discomfort.

## 2019-03-12 ENCOUNTER — Encounter: Payer: Self-pay | Admitting: Urology

## 2019-03-12 ENCOUNTER — Ambulatory Visit (INDEPENDENT_AMBULATORY_CARE_PROVIDER_SITE_OTHER): Payer: Medicare Other | Admitting: Urology

## 2019-03-12 ENCOUNTER — Other Ambulatory Visit: Payer: Self-pay

## 2019-03-12 VITALS — BP 153/77 | HR 88 | Ht 66.0 in | Wt 191.2 lb

## 2019-03-12 DIAGNOSIS — E291 Testicular hypofunction: Secondary | ICD-10-CM | POA: Diagnosis not present

## 2019-03-12 DIAGNOSIS — D352 Benign neoplasm of pituitary gland: Secondary | ICD-10-CM

## 2019-03-12 NOTE — Progress Notes (Signed)
03/12/2019 1:16 PM   Jon [name] 09-01-1950 [id number]  Referring provider: Idelle Crouch, MD [facility] [place],  [place] [address]  Chief Complaint  Patient presents with  . Hypogonadism    HPI: Jon [name] is a 68 y.o. male seen at the request of Dr. Doy Hutching for hypogonadism.  He has been followed by [facility] and was last seen there in December 2019.  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  He was started on TRT at testosterone cypionate 200 mg every 14 days.  On MRI review by his endocrinologist it was felt the microadenoma had been present and unchanged since at least 2014.  His last MRI was in July 2019 and a repeat MRI in 1-2 years was recommended.  They desired to transfer care to [facility].  He ran out of testosterone approximately 2 months ago.  Symptoms include tiredness, fatigue and ED.  He is primarily interested in changing to Testopel implant.  Denies bothersome lower urinary tract symptoms.  He does have sleep apnea and is on CPAP.   PMH: Past Medical History:  Diagnosis Date  . Diabetes mellitus without complication ([place])   . Hypertension   . Pituitary tumor   . TIA (transient ischemic attack)     Surgical History: Past Surgical History:  Procedure Laterality Date  . COLONOSCOPY    . EYE SURGERY    . HERNIA REPAIR      Home Medications:  Allergies as of 03/12/2019      Reactions   Beta Adrenergic Blockers Itching   Topiramate Nausea And Vomiting   Other reaction(s): Dizziness, Hallucination Other Reaction: violence   Aspirin Other (See Comments)   Per pt's PCP due to spontaneous bleeding in his brain.   Benadryl [diphenhydramine] Hives   Lisinopril Itching   Other reaction(s): Other (See Comments) Leg pain   Nabumetone Other (See Comments)   Reaction: unknown   Valproic Acid Other (See Comments)   Reaction: Headache      Medication List       Accurate as of  March 12, 2019  1:16 PM. If you have any questions, ask your nurse or doctor.        acetaminophen 500 MG tablet Commonly known as: TYLENOL Take by mouth.   Aimovig 140 MG/ML Soaj Generic drug: Erenumab-aooe Inject into the skin.   Ajovy 225 MG/1.5ML Soaj Generic drug: Fremanezumab-vfrm   amitriptyline 25 MG tablet Commonly known as: ELAVIL   amLODipine 5 MG tablet Commonly known as: NORVASC Take by mouth.   ARIPiprazole 2 MG tablet Commonly known as: ABILIFY Take 2 mg by mouth daily.   aspirin 81 MG chewable tablet Chew 1 tablet (81 mg total) by mouth daily.   atorvastatin 40 MG tablet Commonly known as: LIPITOR Take 1 tablet (40 mg total) by mouth daily at 6 PM.   butalbital-acetaminophen-caffeine 50-325-40 MG tablet Commonly known as: FIORICET Take by mouth.   carvedilol 25 MG tablet Commonly known as: COREG Take 25 mg by mouth 2 (two) times daily with a meal.   D3-1000 25 MCG (1000 UT) tablet Generic drug: Cholecalciferol Take 1,000 Units by mouth daily.   doxepin 10 MG capsule Commonly known as: SINEQUAN   DULoxetine 60 MG capsule Commonly known as: CYMBALTA Take 60 mg by mouth daily.   gabapentin 300 MG capsule Commonly known as: NEURONTIN Take 600 mg by mouth 2 (two) times daily.   glimepiride 4 MG tablet  Commonly known as: AMARYL Take by mouth.   hydrOXYzine 10 MG tablet Commonly known as: ATARAX/VISTARIL Take 1 tablet by mouth 3 (three) times daily as needed for itching.   ibuprofen 200 MG tablet Commonly known as: ADVIL Take 200 mg by mouth every 6 (six) hours as needed for mild pain.   losartan-hydrochlorothiazide 100-12.5 MG tablet Commonly known as: HYZAAR Take 1 tablet by mouth daily.   pantoprazole 40 MG tablet Commonly known as: PROTONIX Take 40 mg by mouth daily.   pioglitazone 30 MG tablet Commonly known as: ACTOS Take by mouth.   prochlorperazine 10 MG tablet Commonly known as: COMPAZINE TAKE 1 TABLET BY MOUTH  EVERY 6 HOURS AS NEEDED FOR NAUSEA FOR UP TO 7 DAYS   rosuvastatin 10 MG tablet Commonly known as: CRESTOR Take by mouth.   sucralfate 1 GM/10ML suspension Commonly known as: CARAFATE Take by mouth.   testosterone cypionate 200 MG/ML injection Commonly known as: DEPOTESTOSTERONE CYPIONATE Inject 200 mg into the muscle every 14 (fourteen) days.   traZODone 50 MG tablet Commonly known as: DESYREL Take 50 mg by mouth at bedtime.   vitamin B-12 1000 MCG tablet Commonly known as: CYANOCOBALAMIN Take 1,000 mcg by mouth daily.       Allergies:  Allergies  Allergen Reactions  . Beta Adrenergic Blockers Itching  . Topiramate Nausea And Vomiting    Other reaction(s): Dizziness, Hallucination Other Reaction: violence  . Aspirin Other (See Comments)    Per pt's PCP due to spontaneous bleeding in his brain.  . Benadryl [Diphenhydramine] Hives  . Lisinopril Itching    Other reaction(s): Other (See Comments) Leg pain  . Nabumetone Other (See Comments)    Reaction: unknown  . Valproic Acid Other (See Comments)    Reaction: Headache    Family History: No family history on file.  Social History:  reports that he has never smoked. He has never used smokeless tobacco. He reports that he does not drink alcohol or use drugs.  ROS: UROLOGY Frequent Urination?: No Hard to postpone urination?: No Burning/pain with urination?: No Get up at night to urinate?: Yes Leakage of urine?: No Urine stream starts and stops?: No Trouble starting stream?: No Do you have to strain to urinate?: No Blood in urine?: No Urinary tract infection?: No Sexually transmitted disease?: No Injury to kidneys or bladder?: No Painful intercourse?: No Weak stream?: No Erection problems?: Yes Penile pain?: No  Gastrointestinal Nausea?: Yes Vomiting?: No Indigestion/heartburn?: Yes Diarrhea?: No Constipation?: No  Constitutional Fever: No Night sweats?: Yes Weight loss?: No Fatigue?: Yes   Skin Skin rash/lesions?: No Itching?: No  Eyes Blurred vision?: Yes Double vision?: No  Ears/Nose/Throat Sore throat?: No Sinus problems?: No  Hematologic/Lymphatic Swollen glands?: Yes Easy bruising?: No  Cardiovascular Leg swelling?: No Chest pain?: No  Respiratory Cough?: No Shortness of breath?: No  Endocrine Excessive thirst?: No  Musculoskeletal Back pain?: No Joint pain?: No  Neurological Headaches?: Yes Dizziness?: Yes  Psychologic Depression?: Yes Anxiety?: Yes  Physical Exam: BP (!) 153/77 (BP Location: Left Arm, Patient Position: Sitting, Cuff Size: Normal)   Pulse 88   Ht 5\' 6"  (1.676 m)   Wt 191 lb 3.2 oz (86.7 kg)   BMI 30.86 kg/m   Constitutional:  Alert and oriented, No acute distress. HEENT: [place] AT, moist mucus membranes.  Trachea midline, no masses. Cardiovascular: No clubbing, cyanosis, or edema. Respiratory: Normal respiratory effort, no increased work of breathing. Skin: No rashes, bruises or suspicious lesions. Neurologic: Grossly intact, no  focal deficits, moving all 4 extremities. Psychiatric: Normal mood and affect.   Assessment & Plan:    - Hypogonadism Potential side effects of testosterone replacement were discussed including stimulation of benign prostatic growth with lower urinary tract symptoms; erythrocytosis; edema; gynecomastia; worsening sleep apnea; venous thromboembolism; testicular atrophy and infertility. Recent studies suggesting an increased incidence of heart attack and stroke in patients taking testosterone was discussed. He was informed there is conflicting evidence regarding the impact of testosterone therapy on cardiovascular risk. The theoretical risk of growth stimulation of an undetected prostate cancer was also discussed.  He was informed that current evidence does not provide any definitive answers regarding the risks of testosterone therapy on prostate cancer and cardiovascular disease. The need for periodic  monitoring of his testosterone level, PSA, hematocrit and DRE was discussed.  Will check a testosterone level today in he also requested an estradiol level.  They desire to switch to Testopel if covered by insurance.  Repeat pituitary MRI July 2021   Abbie Sons, MD  [facility] [address], [place], [place] [address] [phone]

## 2019-03-13 ENCOUNTER — Encounter: Payer: Self-pay | Admitting: Urology

## 2019-03-13 ENCOUNTER — Telehealth: Payer: Self-pay

## 2019-03-13 DIAGNOSIS — E291 Testicular hypofunction: Secondary | ICD-10-CM | POA: Insufficient documentation

## 2019-03-13 DIAGNOSIS — D352 Benign neoplasm of pituitary gland: Secondary | ICD-10-CM | POA: Insufficient documentation

## 2019-03-13 LAB — TESTOSTERONE: Testosterone: 24 ng/dL — ABNORMAL LOW (ref 264–916)

## 2019-03-13 LAB — ESTRADIOL: Estradiol: <5 pg/mL — ABNORMAL LOW (ref 7.6–42.6)

## 2019-03-13 NOTE — Telephone Encounter (Signed)
-----   Message from Abbie Sons, MD sent at 03/13/2019  9:14 AM EST ----- Testosterone level was very low at 24.  Estradiol was undetectable so he does not need an "estrogen blocker"

## 2019-03-13 NOTE — Telephone Encounter (Signed)
Called pt informed him of the information below. Pt gave verbal understanding.  

## 2019-03-19 NOTE — Progress Notes (Signed)
This is a 68 -year-old male with hypogonadism and he is managed with Testopel. He presents today for Testopel insertion.  Patient is placed on the exam table in the left lateral jackknife position.  Identified upper outer quadrant of hip for insertion; prepped area with Betadine and injected 10 cc's of Lidocaine 1% with Epinephrine to anesthetize superficially and distally along trocar tract.  Made 3 mm incision using 15 blade of scalpel; trocar with sharp ended stylet was inserted into subcutaneous tissue in line with femur. Sharp stylet was withdrawn and 6 pellets were placed into trocar well. Testopel pellets advanced into tissue using blunt ended stylet. Trocar removed and incision closed using 6 Steri-Strips. Cleansed area to remove Betadine and covered Steri-Strips with outer Band-Aid.  Careful inspection of insertion is done and patient informed of post procedure instructions.  Advised patient to apply ice to the site for 20-30 minutes every hour if needed.  Avoid hot tubes, swimming or full water immersion of the insertion site for 72 hours.  Bandage may be removed after one week.    Patient is advised to contact the office if experiencing drainage of the insertion site, excessive redness or swelling of the site, chills and/or fevers > 101.5, nausea or vomiting, dizziness or lightheadedness and excessive tenderness.  Avoid strenuous activity and heavy lifting for 72 hours.     He will return in one month for serum testosterone.

## 2019-03-20 ENCOUNTER — Ambulatory Visit (INDEPENDENT_AMBULATORY_CARE_PROVIDER_SITE_OTHER): Payer: Medicare Other | Admitting: Urology

## 2019-03-20 ENCOUNTER — Other Ambulatory Visit: Payer: Self-pay

## 2019-03-20 ENCOUNTER — Encounter: Payer: Self-pay | Admitting: Urology

## 2019-03-20 VITALS — BP 157/98 | HR 99 | Ht 66.0 in | Wt 190.0 lb

## 2019-03-20 DIAGNOSIS — E291 Testicular hypofunction: Secondary | ICD-10-CM

## 2019-03-20 MED ORDER — TESTOSTERONE 75 MG IL PLLT
450.0000 mg | PELLET | Freq: Once | Status: AC
  Administered 2019-03-20: 450 mg

## 2019-04-16 ENCOUNTER — Other Ambulatory Visit: Payer: Self-pay | Admitting: *Deleted

## 2019-04-16 DIAGNOSIS — E291 Testicular hypofunction: Secondary | ICD-10-CM

## 2019-04-17 ENCOUNTER — Other Ambulatory Visit: Payer: Medicare Other

## 2019-04-20 ENCOUNTER — Other Ambulatory Visit: Payer: Medicare Other

## 2019-04-20 ENCOUNTER — Other Ambulatory Visit: Payer: Self-pay

## 2019-04-20 DIAGNOSIS — E291 Testicular hypofunction: Secondary | ICD-10-CM

## 2019-04-21 LAB — TESTOSTERONE: Testosterone: 463 ng/dL (ref 264–916)

## 2019-04-23 ENCOUNTER — Telehealth: Payer: Self-pay | Admitting: Family Medicine

## 2019-04-23 NOTE — Telephone Encounter (Signed)
Patient notified and voiced understanding.

## 2019-04-23 NOTE — Telephone Encounter (Signed)
-----   Message from Nori Riis, PA-C sent at 04/23/2019  8:36 AM EST ----- Please let Mr. Veeder know that his testosterone level is within the therapeutic range.  We need to have him return in two months for an office visit for an exam.  He will need a PSA, HCT, hemoglobin and testosterone drawn prior to his visit.

## 2019-05-12 ENCOUNTER — Encounter: Payer: Self-pay | Admitting: Radiology

## 2019-05-12 ENCOUNTER — Emergency Department: Payer: Medicare Other

## 2019-05-12 ENCOUNTER — Observation Stay: Payer: Medicare Other

## 2019-05-12 ENCOUNTER — Other Ambulatory Visit: Payer: Self-pay

## 2019-05-12 ENCOUNTER — Inpatient Hospital Stay
Admission: EM | Admit: 2019-05-12 | Discharge: 2019-05-14 | DRG: 065 | Disposition: A | Discharge status: Home Health | Payer: Medicare Other | Attending: Internal Medicine | Admitting: Internal Medicine

## 2019-05-12 DIAGNOSIS — R531 Weakness: Secondary | ICD-10-CM

## 2019-05-12 DIAGNOSIS — R29708 NIHSS score 8: Secondary | ICD-10-CM | POA: Diagnosis present

## 2019-05-12 DIAGNOSIS — I10 Essential (primary) hypertension: Secondary | ICD-10-CM

## 2019-05-12 DIAGNOSIS — E119 Type 2 diabetes mellitus without complications: Secondary | ICD-10-CM

## 2019-05-12 DIAGNOSIS — E1169 Type 2 diabetes mellitus with other specified complication: Secondary | ICD-10-CM

## 2019-05-12 DIAGNOSIS — I639 Cerebral infarction, unspecified: Secondary | ICD-10-CM

## 2019-05-12 DIAGNOSIS — I6381 Other cerebral infarction due to occlusion or stenosis of small artery: Secondary | ICD-10-CM | POA: Diagnosis not present

## 2019-05-12 DIAGNOSIS — Z20822 Contact with and (suspected) exposure to covid-19: Secondary | ICD-10-CM | POA: Diagnosis present

## 2019-05-12 DIAGNOSIS — Z8673 Personal history of transient ischemic attack (TIA), and cerebral infarction without residual deficits: Secondary | ICD-10-CM

## 2019-05-12 DIAGNOSIS — Z833 Family history of diabetes mellitus: Secondary | ICD-10-CM

## 2019-05-12 DIAGNOSIS — Z7984 Long term (current) use of oral hypoglycemic drugs: Secondary | ICD-10-CM

## 2019-05-12 DIAGNOSIS — F32A Depression, unspecified: Secondary | ICD-10-CM | POA: Diagnosis present

## 2019-05-12 DIAGNOSIS — Z7982 Long term (current) use of aspirin: Secondary | ICD-10-CM

## 2019-05-12 DIAGNOSIS — G43909 Migraine, unspecified, not intractable, without status migrainosus: Secondary | ICD-10-CM | POA: Insufficient documentation

## 2019-05-12 DIAGNOSIS — K219 Gastro-esophageal reflux disease without esophagitis: Secondary | ICD-10-CM | POA: Diagnosis not present

## 2019-05-12 DIAGNOSIS — F329 Major depressive disorder, single episode, unspecified: Secondary | ICD-10-CM | POA: Diagnosis present

## 2019-05-12 DIAGNOSIS — E782 Mixed hyperlipidemia: Secondary | ICD-10-CM

## 2019-05-12 DIAGNOSIS — Z79899 Other long term (current) drug therapy: Secondary | ICD-10-CM

## 2019-05-12 DIAGNOSIS — R29898 Other symptoms and signs involving the musculoskeletal system: Secondary | ICD-10-CM | POA: Diagnosis not present

## 2019-05-12 DIAGNOSIS — I69354 Hemiplegia and hemiparesis following cerebral infarction affecting left non-dominant side: Secondary | ICD-10-CM

## 2019-05-12 LAB — DIFFERENTIAL
Abs Immature Granulocytes: 0.02 10*3/uL (ref 0.00–0.07)
Basophils Absolute: 0 10*3/uL (ref 0.0–0.1)
Basophils Relative: 1 %
Eosinophils Absolute: 0.1 10*3/uL (ref 0.0–0.5)
Eosinophils Relative: 3 %
Immature Granulocytes: 0 %
Lymphocytes Relative: 37 %
Lymphs Abs: 2.1 10*3/uL (ref 0.7–4.0)
Monocytes Absolute: 0.4 10*3/uL (ref 0.1–1.0)
Monocytes Relative: 7 %
Neutro Abs: 3 10*3/uL (ref 1.7–7.7)
Neutrophils Relative %: 52 %

## 2019-05-12 LAB — COMPREHENSIVE METABOLIC PANEL
ALT: 39 U/L (ref 0–44)
AST: 41 U/L (ref 15–41)
Albumin: 4.8 g/dL (ref 3.5–5.0)
Alkaline Phosphatase: 73 U/L (ref 38–126)
Anion gap: 14 (ref 5–15)
BUN: 13 mg/dL (ref 8–23)
CO2: 21 mmol/L — ABNORMAL LOW (ref 22–32)
Calcium: 9.3 mg/dL (ref 8.9–10.3)
Chloride: 102 mmol/L (ref 98–111)
Creatinine, Ser: 1.02 mg/dL (ref 0.61–1.24)
GFR calc Af Amer: >60 mL/min (ref >60)
GFR calc non Af Amer: >60 mL/min (ref >60)
Glucose, Bld: 189 mg/dL — ABNORMAL HIGH (ref 70–99)
Potassium: 4 mmol/L (ref 3.5–5.1)
Sodium: 137 mmol/L (ref 135–145)
Total Bilirubin: 1.2 mg/dL (ref 0.3–1.2)
Total Protein: 7.8 g/dL (ref 6.5–8.1)

## 2019-05-12 LAB — CBC
HCT: 44.5 % (ref 39.0–52.0)
Hemoglobin: 15.9 g/dL (ref 13.0–17.0)
MCH: 31.3 pg (ref 26.0–34.0)
MCHC: 35.7 g/dL (ref 30.0–36.0)
MCV: 87.6 fL (ref 80.0–100.0)
Platelets: 201 10*3/uL (ref 150–400)
RBC: 5.08 MIL/uL (ref 4.22–5.81)
RDW: 12.6 % (ref 11.5–15.5)
WBC: 5.7 10*3/uL (ref 4.0–10.5)
nRBC: 0 % (ref 0.0–0.2)

## 2019-05-12 LAB — GLUCOSE, CAPILLARY
Glucose-Capillary: 197 mg/dL — ABNORMAL HIGH (ref 70–99)
Glucose-Capillary: 160 mg/dL — ABNORMAL HIGH (ref 70–99)
Glucose-Capillary: 145 mg/dL — ABNORMAL HIGH (ref 70–99)

## 2019-05-12 LAB — RESPIRATORY PANEL BY RT PCR (FLU A&B, COVID)
Influenza A by PCR: NEGATIVE
Influenza B by PCR: NEGATIVE
SARS Coronavirus 2 by RT PCR: NEGATIVE

## 2019-05-12 LAB — APTT: aPTT: 32 seconds (ref 24–36)

## 2019-05-12 LAB — PROTIME-INR
INR: 0.9 (ref 0.8–1.2)
Prothrombin Time: 12.5 seconds (ref 11.4–15.2)

## 2019-05-12 LAB — ETHANOL: Alcohol, Ethyl (B): <10 mg/dL (ref <10)

## 2019-05-12 MED ORDER — CARBIDOPA-LEVODOPA 25-100 MG PO TABS
1.0000 | ORAL_TABLET | Freq: Three times a day (TID) | ORAL | Status: DC
  Administered 2019-05-12 – 2019-05-13 (×3): 0.5 via ORAL
  Administered 2019-05-13 – 2019-05-14 (×2): 1 via ORAL
  Filled 2019-05-12 (×7): qty 1

## 2019-05-12 MED ORDER — IOHEXOL 350 MG/ML SOLN
75.0000 mL | Freq: Once | INTRAVENOUS | Status: AC | PRN
  Administered 2019-05-12: 75 mL via INTRAVENOUS

## 2019-05-12 MED ORDER — ASPIRIN EC 81 MG PO TBEC
81.0000 mg | DELAYED_RELEASE_TABLET | Freq: Once | ORAL | Status: AC
  Administered 2019-05-12: 81 mg via ORAL
  Filled 2019-05-12: qty 1

## 2019-05-12 MED ORDER — ENOXAPARIN SODIUM 40 MG/0.4ML ~~LOC~~ SOLN
40.0000 mg | SUBCUTANEOUS | Status: DC
  Administered 2019-05-12 – 2019-05-13 (×2): 40 mg via SUBCUTANEOUS
  Filled 2019-05-12 (×2): qty 0.4

## 2019-05-12 MED ORDER — ONDANSETRON HCL 4 MG/2ML IJ SOLN: 4.0000 mg | Freq: Three times a day (TID) | INTRAMUSCULAR | Status: DC | PRN

## 2019-05-12 MED ORDER — ASPIRIN EC 81 MG PO TBEC
81.0000 mg | DELAYED_RELEASE_TABLET | Freq: Every day | ORAL | Status: DC
  Administered 2019-05-13 – 2019-05-14 (×2): 81 mg via ORAL
  Filled 2019-05-12 (×2): qty 1

## 2019-05-12 MED ORDER — INSULIN ASPART 100 UNIT/ML ~~LOC~~ SOLN
0.0000 [IU] | Freq: Three times a day (TID) | SUBCUTANEOUS | Status: DC
  Administered 2019-05-13: 2 [IU] via SUBCUTANEOUS
  Filled 2019-05-12: qty 1

## 2019-05-12 MED ORDER — DULOXETINE HCL 60 MG PO CPEP
60.0000 mg | ORAL_CAPSULE | Freq: Every day | ORAL | Status: DC
  Administered 2019-05-13 – 2019-05-14 (×2): 60 mg via ORAL
  Filled 2019-05-12: qty 1
  Filled 2019-05-12: qty 2

## 2019-05-12 MED ORDER — ROSUVASTATIN CALCIUM 10 MG PO TABS
10.0000 mg | ORAL_TABLET | Freq: Every day | ORAL | Status: DC
  Administered 2019-05-12 – 2019-05-14 (×3): 10 mg via ORAL
  Filled 2019-05-12 (×3): qty 1

## 2019-05-12 MED ORDER — PANTOPRAZOLE SODIUM 40 MG PO TBEC
40.0000 mg | DELAYED_RELEASE_TABLET | Freq: Every day | ORAL | Status: DC
  Administered 2019-05-13 – 2019-05-14 (×2): 40 mg via ORAL
  Filled 2019-05-12 (×2): qty 1

## 2019-05-12 MED ORDER — LOSARTAN POTASSIUM 50 MG PO TABS
100.0000 mg | ORAL_TABLET | Freq: Every day | ORAL | Status: DC
  Administered 2019-05-13 – 2019-05-14 (×2): 100 mg via ORAL
  Filled 2019-05-12 (×2): qty 2

## 2019-05-12 MED ORDER — LOSARTAN POTASSIUM-HCTZ 100-12.5 MG PO TABS: 1.0000 | ORAL_TABLET | Freq: Every day | ORAL | Status: DC

## 2019-05-12 MED ORDER — BUTALBITAL-APAP-CAFFEINE 50-325-40 MG PO TABS
1.0000 | ORAL_TABLET | Freq: Four times a day (QID) | ORAL | Status: DC | PRN
  Administered 2019-05-12: 21:00:00 1 via ORAL
  Filled 2019-05-12: qty 1

## 2019-05-12 MED ORDER — VITAMIN D 25 MCG (1000 UNIT) PO TABS
1000.0000 [IU] | ORAL_TABLET | Freq: Every day | ORAL | Status: DC
  Administered 2019-05-13 – 2019-05-14 (×2): 1000 [IU] via ORAL
  Filled 2019-05-12 (×2): qty 1

## 2019-05-12 MED ORDER — ACETAMINOPHEN 325 MG PO TABS
650.0000 mg | ORAL_TABLET | ORAL | Status: DC | PRN
  Administered 2019-05-13: 18:00:00 650 mg via ORAL
  Filled 2019-05-12: qty 2

## 2019-05-12 MED ORDER — HYDROCHLOROTHIAZIDE 12.5 MG PO CAPS
12.5000 mg | ORAL_CAPSULE | Freq: Every day | ORAL | Status: DC
  Administered 2019-05-14: 12.5 mg via ORAL
  Filled 2019-05-12 (×2): qty 1

## 2019-05-12 MED ORDER — SENNOSIDES-DOCUSATE SODIUM 8.6-50 MG PO TABS: 1.0000 | ORAL_TABLET | Freq: Every evening | ORAL | Status: DC | PRN

## 2019-05-12 MED ORDER — SODIUM CHLORIDE 0.9 % IV SOLN: INTRAVENOUS | Status: DC

## 2019-05-12 MED ORDER — STROKE: EARLY STAGES OF RECOVERY BOOK: Freq: Once | Status: AC

## 2019-05-12 MED ORDER — HYDROXYZINE HCL 10 MG PO TABS
10.0000 mg | ORAL_TABLET | Freq: Three times a day (TID) | ORAL | Status: DC | PRN
  Filled 2019-05-12: qty 1

## 2019-05-12 MED ORDER — DOXEPIN HCL 10 MG PO CAPS
10.0000 mg | ORAL_CAPSULE | Freq: Every day | ORAL | Status: DC
  Administered 2019-05-12 – 2019-05-13 (×2): 10 mg via ORAL
  Filled 2019-05-12 (×3): qty 1

## 2019-05-12 MED ORDER — ACETAMINOPHEN 160 MG/5ML PO SOLN
650.0000 mg | ORAL | Status: DC | PRN
  Filled 2019-05-12: qty 20.3

## 2019-05-12 MED ORDER — AMLODIPINE BESYLATE 5 MG PO TABS
5.0000 mg | ORAL_TABLET | Freq: Every day | ORAL | Status: DC
  Administered 2019-05-13 – 2019-05-14 (×2): 5 mg via ORAL
  Filled 2019-05-12 (×2): qty 1

## 2019-05-12 MED ORDER — ACETAMINOPHEN 650 MG RE SUPP: 650.0000 mg | RECTAL | Status: DC | PRN

## 2019-05-12 MED ORDER — TRAZODONE HCL 50 MG PO TABS
50.0000 mg | ORAL_TABLET | Freq: Every day | ORAL | Status: DC
  Administered 2019-05-12 – 2019-05-13 (×2): 50 mg via ORAL
  Filled 2019-05-12 (×2): qty 1

## 2019-05-12 MED ORDER — INSULIN ASPART 100 UNIT/ML ~~LOC~~ SOLN: 0.0000 [IU] | Freq: Every day | SUBCUTANEOUS | Status: DC

## 2019-05-12 NOTE — H&P (Signed)
History and Physical    Jon [name] [id number] DOB: 03-13-1951 DOA: 05/12/2019  Referring MD/NP/PA:   PCP: Idelle Crouch, MD   Patient coming from:  The patient is coming from home.  At baseline, pt is independent for most of ADL.        Chief Complaint: Worsening left arm weakness  HPI: Jon [name] is a 69 y.o. male with medical history significant of hypertension, hyperlipidemia, diabetes mellitus, TIA, stroke with left-sided weakness, GERD, depression, pituitary tumor, migraine headaches, pituitary tumor (1cm L pit mass as of 06/03/2018 MRI), left AVM and prior cerebral hemorrhage,  Patient states that he has history of stroke with left sided weakness, but his left arm weakness has worsened today at about 12:30.  His left leg weakness has not changed significantly.  No facial droop or slurred speech.  Patient does not have chest pain, shortness of breath, cough.  No nausea vomiting, diarrhea, abdominal, symptoms of UTI. He also reports left-sided headache which he described as "like cotton".  ED Course: pt was found to have WBC 5.8, INR 0.9, PTT 32, negative RVP for COVID-19, electrolytes renal function okay, oxygen saturation 98% on room air, blood pressure 172/97, tachycardia. Head CT with no acute changes.  CTA shows no evidence of LVO. Pt is paced on MedSurg bed for observation. Neurology, Dr. Doy Mince was consulted.  Review of Systems:   General: no fevers, chills, no body weight gain, has fatigue and HA HEENT: no blurry vision, hearing changes or sore throat Respiratory: no dyspnea, coughing, wheezing CV: no chest pain, no palpitations GI: no nausea, vomiting, abdominal pain, diarrhea, constipation GU: no dysuria, burning on urination, increased urinary frequency, hematuria  Ext: no leg edema Neuro: No vision change or hearing loss. Has left-sided weakness Skin: no rash, no skin tear. MSK: No muscle spasm, no deformity, no limitation of range of movement in  spin Heme: No easy bruising.  Travel history: No recent long distant travel.  Allergy:  Allergies  Allergen Reactions  . Beta Adrenergic Blockers Itching  . Topiramate Nausea And Vomiting    Other reaction(s): Dizziness, Hallucination Other Reaction: violence  . Aspirin Other (See Comments)    Per pt's PCP due to spontaneous bleeding in his brain.  . Benadryl [Diphenhydramine] Hives  . Lisinopril Itching    Other reaction(s): Other (See Comments) Leg pain  . Nabumetone Other (See Comments)    Reaction: unknown  . Valproic Acid Other (See Comments)    Reaction: Headache    Past Medical History:  Diagnosis Date  . Diabetes mellitus without complication ([place])   . Hypertension   . Pituitary tumor   . TIA (transient ischemic attack)     Past Surgical History:  Procedure Laterality Date  . COLONOSCOPY    . EYE SURGERY    . HERNIA REPAIR      Social History:  reports that he has never smoked. He has never used smokeless tobacco. He reports that he does not drink alcohol or use drugs.  Family History:  Family History  Problem Relation Age of Onset  . Diabetes Mellitus II Other        Maternal aunt     Prior to Admission medications   Medication Sig Start Date End Date Taking? Authorizing Provider  acetaminophen (TYLENOL) 500 MG tablet Take by mouth.    [provider]  AJOVY 225 MG/1.5ML SOAJ  01/16/19   [provider]  amitriptyline (ELAVIL) 25 MG tablet  02/25/19  [provider]  amLODipine (NORVASC) 5 MG tablet Take by mouth. 09/06/18 09/06/19  [provider]  ARIPiprazole (ABILIFY) 2 MG tablet Take 2 mg by mouth daily.    [provider]  aspirin 81 MG chewable tablet Chew 1 tablet (81 mg total) by mouth daily. 08/22/18   Salary, Avel Peace, MD  atorvastatin (LIPITOR) 40 MG tablet Take 1 tablet (40 mg total) by mouth daily at 6 PM. 08/21/18   Salary, Avel Peace, MD  butalbital-acetaminophen-caffeine (FIORICET) 50-325-40 MG  tablet Take by mouth. 01/12/19   [provider]  carvedilol (COREG) 25 MG tablet Take 25 mg by mouth 2 (two) times daily with a meal.    [provider]  Cholecalciferol (D3-1000) 1000 units tablet Take 1,000 Units by mouth daily.    [provider]  doxepin (SINEQUAN) 10 MG capsule  02/25/19   [provider]  DULoxetine (CYMBALTA) 60 MG capsule Take 60 mg by mouth daily.    [provider]  Erenumab-aooe (AIMOVIG) 140 MG/ML SOAJ Inject into the skin. 02/06/19   [provider]  gabapentin (NEURONTIN) 300 MG capsule Take 600 mg by mouth 2 (two) times daily.     [provider]  glimepiride (AMARYL) 4 MG tablet Take by mouth. 09/06/18 09/06/19  [provider]  hydrOXYzine (ATARAX/VISTARIL) 10 MG tablet Take 1 tablet by mouth 3 (three) times daily as needed for itching.  07/01/17   [provider]  ibuprofen (ADVIL,MOTRIN) 200 MG tablet Take 200 mg by mouth every 6 (six) hours as needed for mild pain.    [provider]  losartan-hydrochlorothiazide (HYZAAR) 100-12.5 MG tablet Take 1 tablet by mouth daily.    [provider]  pantoprazole (PROTONIX) 40 MG tablet Take 40 mg by mouth daily. 06/27/17   [provider]  pioglitazone (ACTOS) 30 MG tablet Take by mouth. 11/08/18 11/08/19  [provider]  prochlorperazine (COMPAZINE) 10 MG tablet TAKE 1 TABLET BY MOUTH EVERY 6 HOURS AS NEEDED FOR NAUSEA FOR UP TO 7 DAYS 02/25/19   [provider]  rosuvastatin (CRESTOR) 10 MG tablet Take by mouth. 11/08/18 11/08/19  [provider]  sucralfate (CARAFATE) 1 GM/10ML suspension Take by mouth. 01/09/19 01/09/20  [provider]  testosterone cypionate (DEPOTESTOSTERONE CYPIONATE) 200 MG/ML injection Inject 200 mg into the muscle every 14 (fourteen) days. 07/29/17   [provider]  traZODone (DESYREL) 50 MG tablet Take 50 mg by mouth at bedtime.     [provider]   vitamin B-12 (CYANOCOBALAMIN) 1000 MCG tablet Take 1,000 mcg by mouth daily.    [provider]    Physical Exam: Vitals:   05/12/19 1615 05/12/19 1645 05/12/19 1715 05/12/19 1745  BP: 126/78 129/78 120/81 123/81  Pulse: (!) 101 98 96 94  Resp: 15 16 15 16   SpO2: 95% 94% 95% 93%  Weight:      Height:       General: Not in acute distress HEENT:       Eyes: PERRL, EOMI, no scleral icterus.       ENT: No discharge from the ears and nose, no pharynx injection, no tonsillar enlargement.        Neck: No JVD, no bruit, no mass felt. Heme: No neck lymph node enlargement. Cardiac: S1/S2, RRR, No murmurs, No gallops or rubs. Respiratory: No rales, wheezing, rhonchi or rubs. GI: Soft, nondistended, nontender, no rebound pain, no organomegaly, BS present. GU: No hematuria Ext: No pitting leg edema  bilaterally. 2+DP/PT pulse bilaterally. Musculoskeletal: No joint deformities, No joint redness or warmth, no limitation of ROM in spin. Skin: No rashes.  Neuro: Alert, oriented X3, cranial nerves II-XII grossly intact, Muscle strength 3/5 in left arm and 4/5 in left leg. 5/5 in right arm and leg. Negative Babinski's sign. sych: Patient is not psychotic, no suicidal or hemocidal ideation.  Labs on Admission: I have personally reviewed following labs and imaging studies  CBC: Recent Labs  Lab 05/12/19 1333  WBC 5.7  NEUTROABS 3.0  HGB 15.9  HCT 44.5  MCV 87.6  PLT 123456   Basic Metabolic Panel: Recent Labs  Lab 05/12/19 1333  NA 137  K 4.0  CL 102  CO2 21*  GLUCOSE 189*  BUN 13  CREATININE 1.02  CALCIUM 9.3   GFR: Estimated Creatinine Clearance: 71.7 mL/min (by C-G formula based on SCr of 1.02 mg/dL). Liver Function Tests: Recent Labs  Lab 05/12/19 1333  AST 41  ALT 39  ALKPHOS 73  BILITOT 1.2  PROT 7.8  ALBUMIN 4.8   No results for input(s): LIPASE, AMYLASE in the last 168 hours. No results for input(s): AMMONIA in the last 168 hours. Coagulation  Profile: Recent Labs  Lab 05/12/19 1333  INR 0.9   Cardiac Enzymes: No results for input(s): CKTOTAL, CKMB, CKMBINDEX, TROPONINI in the last 168 hours. BNP (last 3 results) No results for input(s): PROBNP in the last 8760 hours. HbA1C: No results for input(s): HGBA1C in the last 72 hours. CBG: Recent Labs  Lab 05/12/19 1326  GLUCAP 197*   Lipid Profile: No results for input(s): CHOL, HDL, LDLCALC, TRIG, CHOLHDL, LDLDIRECT in the last 72 hours. Thyroid Function Tests: No results for input(s): TSH, T4TOTAL, FREET4, T3FREE, THYROIDAB in the last 72 hours. Anemia Panel: No results for input(s): VITAMINB12, FOLATE, FERRITIN, TIBC, IRON, RETICCTPCT in the last 72 hours. Urine analysis:    Component Value Date/Time   COLORURINE YELLOW (A) 12/25/2018 1625   APPEARANCEUR CLEAR (A) 12/25/2018 1625   LABSPEC 1.008 12/25/2018 1625   PHURINE 6.0 12/25/2018 1625   GLUCOSEU NEGATIVE 12/25/2018 1625   HGBUR NEGATIVE 12/25/2018 1625   BILIRUBINUR NEGATIVE 12/25/2018 1625   KETONESUR NEGATIVE 12/25/2018 1625   PROTEINUR NEGATIVE 12/25/2018 1625   NITRITE NEGATIVE 12/25/2018 1625   LEUKOCYTESUR NEGATIVE 12/25/2018 1625   Sepsis Labs: @LABRCNTIP (procalcitonin:4,lacticidven:4) ) Recent Results (from the past 240 hour(s))  Respiratory Panel by RT PCR (Flu A&B, Covid) - Nasopharyngeal Swab     Status: None   Collection Time: 05/12/19  1:33 PM   Specimen: Nasopharyngeal Swab  Result Value Ref Range Status   SARS Coronavirus 2 by RT PCR NEGATIVE NEGATIVE Final    Comment: (NOTE) SARS-CoV-2 target nucleic acids are NOT DETECTED. The SARS-CoV-2 RNA is generally detectable in upper respiratoy specimens during the acute phase of infection. The lowest concentration of SARS-CoV-2 viral copies this assay can detect is 131 copies/mL. A negative result does not preclude SARS-Cov-2 infection and should not be used as the sole basis for treatment or other patient management decisions. A negative  result may occur with  improper specimen collection/handling, submission of specimen other than nasopharyngeal swab, presence of viral mutation(s) within the areas targeted by this assay, and inadequate number of viral copies (<131 copies/mL). A negative result must be combined with clinical observations, patient history, and epidemiological information. The expected result is Negative. Fact Sheet for Patients:  PinkCheek.be Fact Sheet for Healthcare Providers:  GravelBags.it This test is not yet ap proved or  cleared by the [place] and  has been authorized for detection and/or diagnosis of SARS-CoV-2 by FDA under an Emergency Use Authorization (EUA). This EUA will remain  in effect (meaning this test can be used) for the duration of the COVID-19 declaration under Section 564(b)(1) of the Act, 21 U.S.C. section 360bbb-3(b)(1), unless the authorization is terminated or revoked sooner.    Influenza A by PCR NEGATIVE NEGATIVE Final   Influenza B by PCR NEGATIVE NEGATIVE Final    Comment: (NOTE) The Xpert Xpress SARS-CoV-2/FLU/RSV assay is intended as an aid in  the diagnosis of influenza from Nasopharyngeal swab specimens and  should not be used as a sole basis for treatment. Nasal washings and  aspirates are unacceptable for Xpert Xpress SARS-CoV-2/FLU/RSV  testing. Fact Sheet for Patients: PinkCheek.be Fact Sheet for Healthcare Providers: GravelBags.it This test is not yet approved or cleared by the [place] FDA and  has been authorized for detection and/or diagnosis of SARS-CoV-2 by  FDA under an Emergency Use Authorization (EUA). This EUA will remain  in effect (meaning this test can be used) for the duration of the  Covid-19 declaration under Section 564(b)(1) of the Act, 21  U.S.C. section 360bbb-3(b)(1), unless the authorization is  terminated or  revoked. Performed at [facility], [address], [place], [place] [address]      Radiological Exams on Admission: CT ANGIO HEAD W OR WO CONTRAST  Result Date: 05/12/2019 CLINICAL DATA:  Stroke code. Left-sided weakness, right-sided headache. EXAM: CT ANGIOGRAPHY HEAD AND NECK TECHNIQUE: Multidetector CT imaging of the head and neck was performed using the standard protocol during bolus administration of intravenous contrast. Multiplanar CT image reconstructions and MIPs were obtained to evaluate the vascular anatomy. Carotid stenosis measurements (when applicable) are obtained utilizing NASCET criteria, using the distal internal carotid diameter as the denominator. CONTRAST:  58mL OMNIPAQUE IOHEXOL 350 MG/ML SOLN COMPARISON:  Noncontrast head CT performed concurrently, MRI/MRA head 12/25/2018, CT angiogram head/neck 08/08/2017 FINDINGS: CTA NECK FINDINGS Aortic arch: Standard aortic branching. Mild mixed plaque within the proximal left subclavian artery. 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 Right carotid system: CCA and ICA patent within the neck without stenosis. Minimal soft plaque within the carotid bulb. Left carotid system: CCA and ICA patent within the neck without measurable stenosis. Mild mixed plaque within the left carotid bulb. Vertebral arteries: Right vertebral artery dominant and patent throughout the neck without significant stenosis. 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 Distal to this, the left vertebral artery is patent throughout the neck without significant stenosis. Skeleton: No acute bony abnormality. Cervical spondylosis greatest at C5-C6 and C6-C7 where there are small posterior disc osteophytes, uncovertebral and facet hypertrophy. Other neck: No neck mass or cervical lymphadenopathy. Upper chest: No consolidation within the  imaged lung apices. Review of the MIP images confirms the above findings CTA HEAD FINDINGS Anterior circulation: The intracranial internal carotid arteries are patent. Calcified plaque within the right paraclinoid segment with no more than mild stenosis. Calcified plaque also present within the left paraclinoid segment with mild/moderate focal stenosis, slightly progressed from prior CT angiogram 08/08/2017. The bilateral middle and anterior cerebral arteries are patent without proximal branch occlusion or high-grade proximal arterial stenosis. No intracranial aneurysm is identified. Posterior circulation: The intracranial vertebral arteries are patent without significant stenosis, as is the basilar artery. The bilateral posterior cerebral arteries are patent without  proximal branch occlusion or high-grade proximal arterial stenosis. Venous sinuses: Within limitations of contrast timing, no convincing thrombus. Anatomic variants: Posterior communicating arteries are poorly delineated and may be hypoplastic or absent bilaterally. Other: Similar to prior examinations, there is a faint blush of contrast enhancement within the posterior left temporal lobe with associated large draining vein, findings consistent with known with AVM. Review of the MIP images confirms the above findings These results were called by telephone at the time of interpretation on 05/12/2019 at 2:11 pm to provider Dr. Jacqualine Code, who verbally acknowledged these results. IMPRESSION: CTA neck: 1. Bilateral common and internal carotid arteries patent within the neck without significant stenosis. Mild plaque within the bilateral carotid bulbs. 2. 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 As before, the left vertebral artery arises from the pseudoaneurysm with moderate origin stenosis. The vertebral arteries are otherwise patent throughout the neck without significant stenosis. CTA head: 1. No emergent intracranial  large vessel occlusion. 2. 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 Mild/moderate focal stenosis of the paraclinoid segment on the left. No more than mild stenosis of the paraclinoid segment on the right. 3. 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 Electronically Signed   By: Kellie Simmering DO   On: 05/12/2019 14:12   CT ANGIO NECK W OR WO CONTRAST  Result Date: 05/12/2019 CLINICAL DATA:  Stroke code. Left-sided weakness, right-sided headache. EXAM: CT ANGIOGRAPHY HEAD AND NECK TECHNIQUE: Multidetector CT imaging of the head and neck was performed using the standard protocol during bolus administration of intravenous contrast. Multiplanar CT image reconstructions and MIPs were obtained to evaluate the vascular anatomy. Carotid stenosis measurements (when applicable) are obtained utilizing NASCET criteria, using the distal internal carotid diameter as the denominator. CONTRAST:  88mL OMNIPAQUE IOHEXOL 350 MG/ML SOLN COMPARISON:  Noncontrast head CT performed concurrently, MRI/MRA head 12/25/2018, CT angiogram head/neck 08/08/2017 FINDINGS: CTA NECK FINDINGS Aortic arch: Standard aortic branching. Mild mixed plaque within the proximal left subclavian artery. 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 Right carotid system: CCA and ICA patent within the neck without stenosis. Minimal soft plaque within the carotid bulb. Left carotid system: CCA and ICA patent within the neck without measurable stenosis. Mild mixed plaque within the left carotid bulb. Vertebral arteries: Right vertebral artery dominant and patent throughout the neck without significant stenosis. 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 Distal to this, the left vertebral artery is patent throughout the neck without significant stenosis. Skeleton: No acute  bony abnormality. Cervical spondylosis greatest at C5-C6 and C6-C7 where there are small posterior disc osteophytes, uncovertebral and facet hypertrophy. Other neck: No neck mass or cervical lymphadenopathy. Upper chest: No consolidation within the imaged lung apices. Review of the MIP images confirms the above findings CTA HEAD FINDINGS Anterior circulation: The intracranial internal carotid arteries are patent. Calcified plaque within the right paraclinoid segment with no more than mild stenosis. Calcified plaque also present within the left paraclinoid segment with mild/moderate focal stenosis, slightly progressed from prior CT angiogram 08/08/2017. The bilateral middle and anterior cerebral arteries are patent without proximal branch occlusion or high-grade proximal arterial stenosis. No intracranial aneurysm is identified. Posterior circulation: The intracranial vertebral arteries are patent without significant stenosis, as is the basilar artery. The bilateral posterior cerebral arteries are patent without proximal branch occlusion or high-grade proximal arterial stenosis. Venous sinuses: Within  limitations of contrast timing, no convincing thrombus. Anatomic variants: Posterior communicating arteries are poorly delineated and may be hypoplastic or absent bilaterally. Other: Similar to prior examinations, there is a faint blush of contrast enhancement within the posterior left temporal lobe with associated large draining vein, findings consistent with known with AVM. Review of the MIP images confirms the above findings These results were called by telephone at the time of interpretation on 05/12/2019 at 2:11 pm to provider Dr. Jacqualine Code, who verbally acknowledged these results. IMPRESSION: CTA neck: 1. Bilateral common and internal carotid arteries patent within the neck without significant stenosis. Mild plaque within the bilateral carotid bulbs. 2. 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 As before, the left vertebral artery arises from the pseudoaneurysm with moderate origin stenosis. The vertebral arteries are otherwise patent throughout the neck without significant stenosis. CTA head: 1. No emergent intracranial large vessel occlusion. 2. 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 Mild/moderate focal stenosis of the paraclinoid segment on the left. No more than mild stenosis of the paraclinoid segment on the right. 3. 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 Electronically Signed   By: Kellie Simmering DO   On: 05/12/2019 14:12   CT HEAD CODE STROKE WO CONTRAST  Result Date: 05/12/2019 CLINICAL DATA:  Code stroke. Neuro deficit, acute, stroke suspected. Patient reports headache with left-sided weakness for 30 minutes. History of brain bleed and AVM EXAM: CT HEAD WITHOUT CONTRAST TECHNIQUE: Contiguous axial images were obtained from the base of the skull through the vertex without intravenous contrast. COMPARISON:  MRI/MRA head 12/25/2018, head CT 12/25/2018 FINDINGS: Brain: No evidence of acute intracranial hemorrhage. No acute demarcated cortical infarction is identified. 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 Also redemonstrated is a small chronic lacunar infarct within the left cerebellum. Stable background generalized parenchymal atrophy and chronic small vessel ischemic disease. Unchanged small focus of hyperdensity corresponding with a known posterior left temporal lobe AVM (series 3, image 16). No evidence of intracranial mass. No midline shift or extra-axial fluid collection. 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 Vascular: Left temporal lobe AVM as described. No hyperdense vessel. Atherosclerotic calcifications. Skull: Normal. Negative for fracture or focal lesion. Sinuses/Orbits: Visualized orbits demonstrate no acute  abnormality. No significant paranasal sinus disease or mastoid effusion at the imaged levels. These results were called by telephone at the time of interpretation on 05/12/2019 at 1:50 pm to provider MARK QUALE , who verbally acknowledged these results. IMPRESSION: 1. No CT evidence of acute intracranial abnormality. 2. 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 3. Stable generalized parenchymal atrophy and chronic small vessel ischemic disease. 4. Small focus of hyperdensity corresponds with a known small left temporal lobe AVM. 5. 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 Electronically Signed   By: Kellie Simmering DO   On: 05/12/2019 13:51     EKG: Independently reviewed.  Not done in ED, will get one.   Assessment/Plan Principal Problem:   Left arm weakness Active Problems:   HTN (hypertension)   GERD (gastroesophageal reflux disease)   History of stroke   Hyperlipidemia, mixed   Hypertension   Diabetes mellitus without complication (HCC)   Depression   Worsening left arm weakness and hx of stroke: Patient has a history of stroke with left-sided weakness, but his left arm weakness has worsened today.  Neurology was consulted, Dr. Doy Mince recommended to  further stroke work-up.  - will place on tele bed for obs - Highly appreciated neurologist's consultation,will follow up recommendations as follows: Plan: 1. HgbA1c, fasting lipid panel 2. MRI of the brain without contrast 3. PT consult, OT consult, Speech consult 4. Echocardiogram 5. Prophylactic therapy-Continue ASA 6. NPO until RN stroke swallow screen 7. Telemetry monitoring 8. Frequent neuro checks 9. EEG  Essential hypertension: -Amlodipine and hyzarr  GERD (gastroesophageal reflux disease): -PPI  Hyperlipidemia, mixed: -Crestor  Diabetes mellitus without complication ([place]): Last A1c 7.5 on 08/20/18, poorly controled. Patient is taking Actos and Amaryl at  home -SSI  Depression: -Cymbalta    DVT ppx: SQ Lovenox Code Status: Full code Family Communication: None at bed side.  Disposition Plan:  Anticipate discharge back to previous home environment Consults called:  none Admission status: Med-surg bed for obs as inpt    Tele bed for obs   as inpt      SDU/inpation       Date of Service 05/12/2019    [facility]   If 7PM-7AM, please contact night-coverage [email or web address] Password [facility] 05/12/2019, 6:42 PM

## 2019-05-12 NOTE — ED Provider Notes (Signed)
[facility] Emergency Department Provider Note   ____________________________________________   First MD Initiated Contact with Patient 05/12/19 1326     (approximate)  I have reviewed the triage vital signs and the nursing notes.   HISTORY  Chief Complaint Code Stroke   EM caveat: Acuity acute neurologic deficit HPI DECARI KOPPLIN is a 69 y.o. male the history of previous stroke, patient with a history of arteriovenous malformation and pituitary adenoma.  Patient reports he suddenly began having a bit of a headache, weakness in his left arm feeling of fatigue.  This started 30 minutes prior to arrival.  Reports he is continued to have difficulty with his left arm.  He feels very weak.  He has a mild headache.  He feels like there is a headache on the right side  He tells me he cannot have strong blood thinners and that he has a previous history of a "AVM"   Of note, patient denies any recent illness no fevers, no known exposure to Covid.  No cough no shortness of breath or chest pain.  Past Medical History:  Diagnosis Date  . Diabetes mellitus without complication ([place])   . Hypertension   . Pituitary tumor   . TIA (transient ischemic attack)     Patient Active Problem List   Diagnosis Date Noted  . Hypogonadism in male 03/13/2019  . Pituitary adenoma ([place]) 03/13/2019  . History of stroke 11/29/2018  . Hx of adenomatous colonic polyps 11/29/2018  . Other nontraumatic intracerebral hemorrhage ([place]) 09/06/2018  . Occipital stroke ([place]) 08/19/2018  . 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 Left-sided weakness 06/02/2018  . HTN (hypertension) 06/02/2018  . Diabetes ([place]) 06/02/2018  . GERD (gastroesophageal reflux disease) 06/02/2018  . Migraine without aura and without status migrainosus, not intractable 01/04/2018  . 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 Headache 07/30/2017  . 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  Chronic pain of left knee 05/05/2017  . 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 TIA (transient ischemic attack) 05/01/2017  . Pseudophakia of left eye 03/14/2017  . OSA (obstructive sleep apnea) 11/02/2016  . 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 Hyperlipidemia, mixed 04/29/2015  . Severe episode of recurrent major depressive disorder, without psychotic features ([place]) 09/25/2014  . Rhytides 01/04/2013  . Gross hematuria 04/11/2012  . Nephrolithiasis 04/11/2012  . Heartburn 11/04/2010    Past Surgical History:  Procedure Laterality Date  . COLONOSCOPY    . EYE SURGERY    . HERNIA REPAIR      Prior to Admission medications   Medication Sig Start Date End Date Taking? Authorizing Provider  acetaminophen (TYLENOL) 500 MG tablet Take by mouth.    [provider]  AJOVY 225 MG/1.5ML SOAJ  01/16/19   [provider]  amitriptyline (ELAVIL) 25 MG tablet  02/25/19   [provider]  amLODipine (NORVASC) 5 MG tablet Take by mouth. 09/06/18 09/06/19  [provider]  ARIPiprazole (ABILIFY) 2 MG tablet Take 2 mg by mouth daily.    [provider]  aspirin 81 MG chewable tablet Chew 1 tablet (81 mg total) by mouth daily. 08/22/18   Salary, Avel Peace, MD  atorvastatin (LIPITOR) 40 MG tablet Take 1 tablet (40 mg total) by mouth daily at 6 PM. 08/21/18   Salary, Avel Peace, MD  butalbital-acetaminophen-caffeine (FIORICET) 50-325-40 MG tablet Take by mouth. 01/12/19   [provider]  carvedilol (COREG) 25 MG tablet Take 25  mg by mouth 2 (two) times daily with a meal.    [provider]  Cholecalciferol (D3-1000) 1000 units tablet Take 1,000 Units by mouth daily.    [provider]  doxepin (SINEQUAN) 10 MG capsule  02/25/19   [provider]  DULoxetine (CYMBALTA) 60 MG capsule Take 60 mg by mouth daily.    [provider]  Erenumab-aooe (AIMOVIG) 140 MG/ML SOAJ Inject into the skin. 02/06/19   [provider]  gabapentin (NEURONTIN) 300 MG capsule Take 600 mg by mouth 2 (two) times daily.     [provider]  glimepiride (AMARYL) 4 MG tablet Take by mouth. 09/06/18 09/06/19  [provider]  hydrOXYzine (ATARAX/VISTARIL) 10 MG tablet Take 1 tablet by mouth 3 (three) times daily as needed for itching.  07/01/17   [provider]  ibuprofen (ADVIL,MOTRIN) 200 MG tablet Take 200 mg by mouth every 6 (six) hours as needed for mild pain.    [provider]  losartan-hydrochlorothiazide (HYZAAR) 100-12.5 MG tablet Take 1 tablet by mouth daily.    [provider]  pantoprazole (PROTONIX) 40 MG tablet Take 40 mg by mouth daily. 06/27/17   [provider]  pioglitazone (ACTOS) 30 MG tablet Take by mouth. 11/08/18 11/08/19  [provider]  prochlorperazine (COMPAZINE) 10 MG tablet TAKE 1 TABLET BY MOUTH EVERY 6 HOURS AS NEEDED FOR NAUSEA FOR UP TO 7 DAYS 02/25/19   [provider]  rosuvastatin (CRESTOR) 10 MG tablet Take by mouth. 11/08/18 11/08/19  [provider]  sucralfate (CARAFATE) 1 GM/10ML suspension Take by mouth. 01/09/19 01/09/20  [provider]  testosterone cypionate (DEPOTESTOSTERONE CYPIONATE) 200 MG/ML injection Inject 200 mg into the muscle every 14 (fourteen) days. 07/29/17   [provider]  traZODone (DESYREL) 50 MG tablet Take 50 mg by mouth at bedtime.     [provider]  vitamin B-12 (CYANOCOBALAMIN) 1000 MCG tablet Take 1,000 mcg by mouth daily.    [provider]    Allergies Beta adrenergic blockers, Topiramate, Aspirin, Benadryl [diphenhydramine], Lisinopril, Nabumetone, and Valproic acid  No family history on file.  Social History Social History   Tobacco Use  . Smoking status: Never Smoker  . Smokeless tobacco: Never Used  Substance Use Topics  . Alcohol use: Never  . Drug use: Never    Review of Systems Constitutional: No fever/chills Eyes: No visual  changes. ENT: No sore throat. Cardiovascular: Denies chest pain. Respiratory: Denies shortness of breath. Gastrointestinal: No abdominal pain.   Genitourinary: Negative for dysuria. Musculoskeletal: Negative for back pain. Skin: Negative for rash. Neurological: Negative for headaches, areas of focal weakness or numbness.    ____________________________________________   PHYSICAL EXAM:  VITAL SIGNS: ED Triage Vitals  Enc Vitals Group     BP      Pulse      Resp      Temp      Temp src      SpO2      Weight      Height      Head Circumference      Peak Flow      Pain Score      Pain Loc      Pain Edu?      Excl. in [place]?     Constitutional: Alert and oriented. Well appearing and in no acute distress. Eyes: Conjunctivae are normal. Head: Atraumatic. Nose: No congestion/rhinnorhea. Mouth/Throat: Mucous membranes are moist. Neck: No stridor.  Cardiovascular:  Normal rate, regular rhythm. Grossly normal heart sounds.  Good peripheral circulation. Respiratory: Normal respiratory effort.  No retractions. Lungs CTAB. Gastrointestinal: Soft and nontender. No distention. Musculoskeletal: No lower extremity tenderness nor edema. Neurologic: Have just slight slurring of speech.  He has difficulty with use of the left arm, appears very weak unable to raise it against gravity.  Right arm is normal..  He is fully alert.  Please see documentation from neurology team for full NIH Skin:  Skin is sweaty, dry and intact. No rash noted. Psychiatric: Mood and affect are normal. Speech and behavior are normal.  ____________________________________________   LABS (all labs ordered are listed, but only abnormal results are displayed)  Labs Reviewed  GLUCOSE, CAPILLARY - Abnormal; Notable for the following components:      Result Value   Glucose-Capillary 197 (*)    All other components within normal limits  COMPREHENSIVE METABOLIC PANEL - Abnormal; Notable for the following components:    CO2 21 (*)    Glucose, Bld 189 (*)    All other components within normal limits  RESPIRATORY PANEL BY RT PCR (FLU A&B, COVID)  ETHANOL  PROTIME-INR  APTT  CBC  DIFFERENTIAL  URINE DRUG SCREEN, QUALITATIVE ([facility] ONLY)  URINALYSIS, ROUTINE W REFLEX MICROSCOPIC   ____________________________________________  EKG  Reviewed entered by me at 1330 Heart rate 110 Cures 110 QTc 480 Sinus tachycardia, no evidence of acute ischemia.  Slight baseline wander ____________________________________________  RADIOLOGY  CT ANGIO HEAD W OR WO CONTRAST  Result Date: 05/12/2019 CLINICAL DATA:  Stroke code. Left-sided weakness, right-sided headache. EXAM: CT ANGIOGRAPHY HEAD AND NECK TECHNIQUE: Multidetector CT imaging of the head and neck was performed using the standard protocol during bolus administration of intravenous contrast. Multiplanar CT image reconstructions and MIPs were obtained to evaluate the vascular anatomy. Carotid stenosis measurements (when applicable) are obtained utilizing NASCET criteria, using the distal internal carotid diameter as the denominator. CONTRAST:  61mL OMNIPAQUE IOHEXOL 350 MG/ML SOLN COMPARISON:  Noncontrast head CT performed concurrently, MRI/MRA head 12/25/2018, CT angiogram head/neck 08/08/2017 FINDINGS: CTA NECK FINDINGS Aortic arch: Standard aortic branching. Mild mixed plaque within the proximal left subclavian artery. 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 Right carotid system: CCA and ICA patent within the neck without stenosis. Minimal soft plaque within the carotid bulb. Left carotid system: CCA and ICA patent within the neck without measurable stenosis. Mild mixed plaque within the left carotid bulb. Vertebral arteries: Right vertebral artery dominant and patent throughout the neck without significant stenosis. The left vertebral artery arises from the left subclavian pseudoaneurysm with  unchanged moderate origin stenosis. Distal to this, the left vertebral artery is patent throughout the neck without significant stenosis. Skeleton: No acute bony abnormality. Cervical spondylosis greatest at C5-C6 and C6-C7 where there are small posterior disc osteophytes, uncovertebral and facet hypertrophy. Other neck: No neck mass or cervical lymphadenopathy. Upper chest: No consolidation within the imaged lung apices. Review of the MIP images confirms the above findings CTA HEAD FINDINGS Anterior circulation: The intracranial internal carotid arteries are patent. Calcified plaque within the right paraclinoid segment with no more than mild stenosis. Calcified plaque also present within the left paraclinoid segment with mild/moderate focal stenosis, slightly progressed from prior CT angiogram 08/08/2017. The bilateral middle and anterior cerebral arteries are patent without proximal branch occlusion or high-grade proximal arterial stenosis. No intracranial aneurysm is identified. Posterior circulation: The intracranial vertebral arteries are patent without significant stenosis,  as is the basilar artery. The bilateral posterior cerebral arteries are patent without proximal branch occlusion or high-grade proximal arterial stenosis. Venous sinuses: Within limitations of contrast timing, no convincing thrombus. Anatomic variants: Posterior communicating arteries are poorly delineated and may be hypoplastic or absent bilaterally. Other: Similar to prior examinations, there is a faint blush of contrast enhancement within the posterior left temporal lobe with associated large draining vein, findings consistent with known with AVM. Review of the MIP images confirms the above findings These results were called by telephone at the time of interpretation on 05/12/2019 at 2:11 pm to provider Dr. Jacqualine Code, who verbally acknowledged these results. IMPRESSION: CTA neck: 1. Bilateral common and internal carotid arteries patent within  the neck without significant stenosis. Mild plaque within the bilateral carotid bulbs. 2. 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 As before, the left vertebral artery arises from the pseudoaneurysm with moderate origin stenosis. The vertebral arteries are otherwise patent throughout the neck without significant stenosis. CTA head: 1. No emergent intracranial large vessel occlusion. 2. 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 Mild/moderate focal stenosis of the paraclinoid segment on the left. No more than mild stenosis of the paraclinoid segment on the right. 3. 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 Electronically Signed   By: Kellie Simmering DO   On: 05/12/2019 14:12   CT ANGIO NECK W OR WO CONTRAST  Result Date: 05/12/2019 CLINICAL DATA:  Stroke code. Left-sided weakness, right-sided headache. EXAM: CT ANGIOGRAPHY HEAD AND NECK TECHNIQUE: Multidetector CT imaging of the head and neck was performed using the standard protocol during bolus administration of intravenous contrast. Multiplanar CT image reconstructions and MIPs were obtained to evaluate the vascular anatomy. Carotid stenosis measurements (when applicable) are obtained utilizing NASCET criteria, using the distal internal carotid diameter as the denominator. CONTRAST:  49mL OMNIPAQUE IOHEXOL 350 MG/ML SOLN COMPARISON:  Noncontrast head CT performed concurrently, MRI/MRA head 12/25/2018, CT angiogram head/neck 08/08/2017 FINDINGS: CTA NECK FINDINGS Aortic arch: Standard aortic branching. Mild mixed plaque within the proximal left subclavian artery. 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 Right carotid system: CCA and ICA patent within the neck without stenosis. Minimal soft plaque within the carotid bulb. Left carotid system: CCA and ICA patent within the neck without measurable  stenosis. Mild mixed plaque within the left carotid bulb. Vertebral arteries: Right vertebral artery dominant and patent throughout the neck without significant stenosis. 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 Distal to this, the left vertebral artery is patent throughout the neck without significant stenosis. Skeleton: No acute bony abnormality. Cervical spondylosis greatest at C5-C6 and C6-C7 where there are small posterior disc osteophytes, uncovertebral and facet hypertrophy. Other neck: No neck mass or cervical lymphadenopathy. Upper chest: No consolidation within the imaged lung apices. Review of the MIP images confirms the above findings CTA HEAD FINDINGS Anterior circulation: The intracranial internal carotid arteries are patent. Calcified plaque within the right paraclinoid segment with no more than mild stenosis. Calcified plaque also present within the left paraclinoid segment with mild/moderate focal stenosis, slightly progressed from prior CT angiogram 08/08/2017. The bilateral middle and anterior cerebral arteries are patent without proximal branch occlusion or high-grade proximal arterial stenosis. No intracranial aneurysm is identified. Posterior circulation: The intracranial vertebral arteries are patent without significant stenosis, as is the basilar artery. The bilateral posterior cerebral arteries are  patent without proximal branch occlusion or high-grade proximal arterial stenosis. Venous sinuses: Within limitations of contrast timing, no convincing thrombus. Anatomic variants: Posterior communicating arteries are poorly delineated and may be hypoplastic or absent bilaterally. Other: Similar to prior examinations, there is a faint blush of contrast enhancement within the posterior left temporal lobe with associated large draining vein, findings consistent with known with AVM. Review of the MIP images confirms the above  findings These results were called by telephone at the time of interpretation on 05/12/2019 at 2:11 pm to provider Dr. Jacqualine Code, who verbally acknowledged these results. IMPRESSION: CTA neck: 1. Bilateral common and internal carotid arteries patent within the neck without significant stenosis. Mild plaque within the bilateral carotid bulbs. 2. 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 As before, the left vertebral artery arises from the pseudoaneurysm with moderate origin stenosis. The vertebral arteries are otherwise patent throughout the neck without significant stenosis. CTA head: 1. No emergent intracranial large vessel occlusion. 2. 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 Mild/moderate focal stenosis of the paraclinoid segment on the left. No more than mild stenosis of the paraclinoid segment on the right. 3. 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 Electronically Signed   By: Kellie Simmering DO   On: 05/12/2019 14:12   CT HEAD CODE STROKE WO CONTRAST  Result Date: 05/12/2019 CLINICAL DATA:  Code stroke. Neuro deficit, acute, stroke suspected. Patient reports headache with left-sided weakness for 30 minutes. History of brain bleed and AVM EXAM: CT HEAD WITHOUT CONTRAST TECHNIQUE: Contiguous axial images were obtained from the base of the skull through the vertex without intravenous contrast. COMPARISON:  MRI/MRA head 12/25/2018, head CT 12/25/2018 FINDINGS: Brain: No evidence of acute intracranial hemorrhage. No acute demarcated cortical infarction is identified. 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 Also redemonstrated is a small chronic lacunar infarct within the left cerebellum. Stable background generalized parenchymal atrophy and chronic small vessel ischemic disease. Unchanged small focus of hyperdensity corresponding with a known posterior left  temporal lobe AVM (series 3, image 16). No evidence of intracranial mass. No midline shift or extra-axial fluid collection. 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 Vascular: Left temporal lobe AVM as described. No hyperdense vessel. Atherosclerotic calcifications. Skull: Normal. Negative for fracture or focal lesion. Sinuses/Orbits: Visualized orbits demonstrate no acute abnormality. No significant paranasal sinus disease or mastoid effusion at the imaged levels. These results were called by telephone at the time of interpretation on 05/12/2019 at 1:50 pm to provider Chavie Kolinski , who verbally acknowledged these results. IMPRESSION: 1. No CT evidence of acute intracranial abnormality. 2. 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 3. Stable generalized parenchymal atrophy and chronic small vessel ischemic disease. 4. Small focus of hyperdensity corresponds with a known small left temporal lobe AVM. 5. 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 Electronically Signed   By: Kellie Simmering DO   On: 05/12/2019 13:51     CT imaging reviewed.  Discussed with radiologist as well as Dr. Doy Mince.  There is not to be evidence of acute process to explain symptoms at this time, and no acute hemorrhage. ____________________________________________   PROCEDURES  Procedure(s) performed: None  Procedures  Critical Care performed: Yes, see critical care note(s)  CRITICAL CARE Performed by: Delman Kitten   Total critical care time: 35 minutes  Critical care time was exclusive of separately billable procedures and  treating other patients.  Critical care was necessary to treat or prevent imminent or life-threatening deterioration.  Critical care was time spent personally by me on the following activities: development of treatment plan with patient and/or surrogate as well as nursing, discussions with consultants, evaluation of patient's  response to treatment, examination of patient, obtaining history from patient or surrogate, ordering and performing treatments and interventions, ordering and review of laboratory studies, ordering and review of radiographic studies, pulse oximetry and re-evaluation of patient's condition.  ____________________________________________   INITIAL IMPRESSION / ASSESSMENT AND PLAN / ED COURSE  Pertinent labs & imaging results that were available during my care of the patient were reviewed by me and considered in my medical decision making (see chart for details).   Patient presents for acute left-sided arm weakness, associated mild right-sided headache.  Reports similar symptoms have occurred several times, does have a history of stroke as well as AVM.  Denies any acute infectious or cardiopulmonary symptoms. Plan to provide broad investigation as to cause but initial focus on potential acute neurologic etiology given acute neurologic symptoms  Clinical Course as of May 11 1445  Sat May 12, 2019  1327 Dr. Doy Mince of the stroke team has been alerted, confirmed she is on her way to see the patient.  The patient tells me he has a history of an AVM, and review of his chart does indicate a previous history of arteriovenous malformation.  Based upon this, I am quite concerned that the patient could be at risk for significant intracranial hemorrhage as cause and he would not be a TPA candidate in my opinion.  Await final recommendations from Dr. Doy Mince   [MQ]    Clinical Course User Index [MQ] Delman Kitten, MD   ----------------------------------------- 2:46 PM on 05/12/2019 -----------------------------------------  Dr. Doy Mince is advises with the patient's previous medical history to start with a baby aspirin, admit patient to the hospital to hospital service for further work-up and follow along with the neurology service as to work-up for left-sided weakness.  Patient is not a TPA candidate given  his past medical history   Jon [name] was evaluated in Emergency Department on 05/12/2019 for the symptoms described in the history of present illness. He was evaluated in the context of the global COVID-19 pandemic, which necessitated consideration that the patient might be at risk for infection with the SARS-CoV-2 virus that causes COVID-19. Institutional protocols and algorithms that pertain to the evaluation of patients at risk for COVID-19 are in a state of rapid change based on information released by regulatory bodies including the CDC and federal and state organizations. These policies and algorithms were followed during the patient's care in the ED.   ____________________________________________   FINAL CLINICAL IMPRESSION(S) / ED DIAGNOSES  Final diagnoses:  Left-sided weakness        Note:  This document was prepared using Dragon voice recognition software and may include unintentional dictation errors       Delman Kitten, MD 05/12/19 1447

## 2019-05-12 NOTE — ED Triage Notes (Signed)
Pt states headache with left sided weakness x 30 min. Hx of brain bleed and avm and is not on blood thinner

## 2019-05-12 NOTE — ED Notes (Signed)
ED TO INPATIENT HANDOFF REPORT  ED Nurse Name and Phone #: Daniah Zaldivar [phone]  S Name/Age/Gender Jon [name] 68 y.o. male Room/Bed: ED17A/ED17A  Code Status   Code Status: Prior  Home/SNF/Other Home Patient oriented to: self, place, time and situation Is this baseline? Yes   Triage Complete: Triage complete  Chief Complaint Left arm weakness [R29.898]  Triage Note Pt states headache with left sided weakness x 30 min. Hx of brain bleed and avm and is not on blood thinner    Allergies Allergies  Allergen Reactions  . Beta Adrenergic Blockers Itching  . Topiramate Nausea And Vomiting    Other reaction(s): Dizziness, Hallucination Other Reaction: violence  . Aspirin Other (See Comments)    Per pt's PCP due to spontaneous bleeding in his brain.  . Benadryl [Diphenhydramine] Hives  . Lisinopril Itching    Other reaction(s): Other (See Comments) Leg pain  . Nabumetone Other (See Comments)    Reaction: unknown  . Valproic Acid Other (See Comments)    Reaction: Headache    Level of Care/Admitting Diagnosis ED Disposition    ED Disposition Condition Comment   [facility]: [place] [[id number]]  Level of Care: Med-Surg [16]  Covid Evaluation: Confirmed COVID Negative  Diagnosis: Left arm weakness [id number]  Admitting Physician: Ivor Costa [place]  Attending Physician: Ivor Costa [[id number]]       B Medical/Surgery History Past Medical History:  Diagnosis Date  . Diabetes mellitus without complication ([place])   . Hypertension   . Pituitary tumor   . TIA (transient ischemic attack)    Past Surgical History:  Procedure Laterality Date  . COLONOSCOPY    . EYE SURGERY    . HERNIA REPAIR       A IV Location/Drains/Wounds Patient Lines/Drains/Airways Status   Active Line/Drains/Airways    Name:   Placement date:   Placement time:   Site:   Days:   Peripheral IV 05/12/19 Left Antecubital   05/12/19    1333    Antecubital   less than 1           Intake/Output Last 24 hours No intake or output data in the 24 hours ending 05/12/19 1759  Labs/Imaging Results for orders placed or performed during the hospital encounter of 05/12/19 (from the past 48 hour(s))  Glucose, capillary     Status: Abnormal   Collection Time: 05/12/19  1:26 PM  Result Value Ref Range   Glucose-Capillary 197 (H) 70 - 99 mg/dL  Ethanol     Status: None   Collection Time: 05/12/19  1:33 PM  Result Value Ref Range   Alcohol, Ethyl (B) <10 <10 mg/dL    Comment: (NOTE) Lowest detectable limit for serum alcohol is 10 mg/dL. For medical purposes only. Performed at [facility], [address] [place], [place] [address]   Protime-INR     Status: None   Collection Time: 05/12/19  1:33 PM  Result Value Ref Range   Prothrombin Time 12.5 11.4 - 15.2 seconds   INR 0.9 0.8 - 1.2    Comment: (NOTE) INR goal varies based on device and disease states. Performed at [facility], [address] [place], [place] [address]   APTT     Status: None   Collection Time: 05/12/19  1:33 PM  Result Value Ref Range   aPTT 32 24 - 36 seconds    Comment: Performed at [facility], [address], [place], [place] [address]  CBC     Status: None   Collection Time: 05/12/19  1:33 PM  Result Value Ref Range   WBC 5.7 4.0 - 10.5 K/uL   RBC 5.08 4.22 - 5.81 MIL/uL   Hemoglobin 15.9 13.0 - 17.0 g/dL   HCT 44.5 39.0 - 52.0 %   MCV 87.6 80.0 - 100.0 fL   MCH 31.3 26.0 - 34.0 pg   MCHC 35.7 30.0 - 36.0 g/dL   RDW 12.6 11.5 - 15.5 %   Platelets 201 150 - 400 K/uL   nRBC 0.0 0.0 - 0.2 %    Comment: Performed at [facility], [address] [place], [place] [address]  Differential     Status: None   Collection Time: 05/12/19  1:33 PM  Result Value Ref Range   Neutrophils Relative % 52 %   Neutro Abs 3.0 1.7 - 7.7 K/uL   Lymphocytes Relative 37 %   Lymphs Abs 2.1 0.7 - 4.0 K/uL   Monocytes Relative 7 %   Monocytes Absolute 0.4  0.1 - 1.0 K/uL   Eosinophils Relative 3 %   Eosinophils Absolute 0.1 0.0 - 0.5 K/uL   Basophils Relative 1 %   Basophils Absolute 0.0 0.0 - 0.1 K/uL   Immature Granulocytes 0 %   Abs Immature Granulocytes 0.02 0.00 - 0.07 K/uL    Comment: Performed at [facility], [address] [place], [place] [address]  Comprehensive metabolic panel     Status: Abnormal   Collection Time: 05/12/19  1:33 PM  Result Value Ref Range   Sodium 137 135 - 145 mmol/L   Potassium 4.0 3.5 - 5.1 mmol/L   Chloride 102 98 - 111 mmol/L   CO2 21 (L) 22 - 32 mmol/L   Glucose, Bld 189 (H) 70 - 99 mg/dL   BUN 13 8 - 23 mg/dL   Creatinine, Ser 1.02 0.61 - 1.24 mg/dL   Calcium 9.3 8.9 - 10.3 mg/dL   Total Protein 7.8 6.5 - 8.1 g/dL   Albumin 4.8 3.5 - 5.0 g/dL   AST 41 15 - 41 U/L   ALT 39 0 - 44 U/L   Alkaline Phosphatase 73 38 - 126 U/L   Total Bilirubin 1.2 0.3 - 1.2 mg/dL   GFR calc non Af Amer >60 >60 mL/min   GFR calc Af Amer >60 >60 mL/min   Anion gap 14 5 - 15    Comment: Performed at [facility], [address], [place], [place] [address]  Respiratory Panel by RT PCR (Flu A&B, Covid) - Nasopharyngeal Swab     Status: None   Collection Time: 05/12/19  1:33 PM   Specimen: Nasopharyngeal Swab  Result Value Ref Range   SARS Coronavirus 2 by RT PCR NEGATIVE NEGATIVE    Comment: (NOTE) SARS-CoV-2 target nucleic acids are NOT DETECTED. The SARS-CoV-2 RNA is generally detectable in upper respiratoy specimens during the acute phase of infection. The lowest concentration of SARS-CoV-2 viral copies this assay can detect is 131 copies/mL. A negative result does not preclude SARS-Cov-2 infection and should not be used as the sole basis for treatment or other patient management decisions. A negative result may occur with  improper specimen collection/handling, submission of specimen other than nasopharyngeal swab, presence of viral mutation(s) within the areas targeted by this assay, and  inadequate number of viral copies (<131 copies/mL). A negative result must be combined with clinical observations, patient history, and epidemiological information. The expected result is Negative. Fact  Sheet for Patients:  PinkCheek.be Fact Sheet for Healthcare Providers:  GravelBags.it This test is not yet ap proved or cleared by the [place] FDA and  has been authorized for detection and/or diagnosis of SARS-CoV-2 by FDA under an Emergency Use Authorization (EUA). This EUA will remain  in effect (meaning this test can be used) for the duration of the COVID-19 declaration under Section 564(b)(1) of the Act, 21 U.S.C. section 360bbb-3(b)(1), unless the authorization is terminated or revoked sooner.    Influenza A by PCR NEGATIVE NEGATIVE   Influenza B by PCR NEGATIVE NEGATIVE    Comment: (NOTE) The Xpert Xpress SARS-CoV-2/FLU/RSV assay is intended as an aid in  the diagnosis of influenza from Nasopharyngeal swab specimens and  should not be used as a sole basis for treatment. Nasal washings and  aspirates are unacceptable for Xpert Xpress SARS-CoV-2/FLU/RSV  testing. Fact Sheet for Patients: PinkCheek.be Fact Sheet for Healthcare Providers: GravelBags.it This test is not yet approved or cleared by the [place] FDA and  has been authorized for detection and/or diagnosis of SARS-CoV-2 by  FDA under an Emergency Use Authorization (EUA). This EUA will remain  in effect (meaning this test can be used) for the duration of the  Covid-19 declaration under Section 564(b)(1) of the Act, 21  U.S.C. section 360bbb-3(b)(1), unless the authorization is  terminated or revoked. Performed at [facility], [address] [place], [place] [address]    CT ANGIO HEAD W OR WO CONTRAST  Result Date: 05/12/2019 CLINICAL DATA:  Stroke code. Left-sided weakness,  right-sided headache. EXAM: CT ANGIOGRAPHY HEAD AND NECK TECHNIQUE: Multidetector CT imaging of the head and neck was performed using the standard protocol during bolus administration of intravenous contrast. Multiplanar CT image reconstructions and MIPs were obtained to evaluate the vascular anatomy. Carotid stenosis measurements (when applicable) are obtained utilizing NASCET criteria, using the distal internal carotid diameter as the denominator. CONTRAST:  57mL OMNIPAQUE IOHEXOL 350 MG/ML SOLN COMPARISON:  Noncontrast head CT performed concurrently, MRI/MRA head 12/25/2018, CT angiogram head/neck 08/08/2017 FINDINGS: CTA NECK FINDINGS Aortic arch: Standard aortic branching. Mild mixed plaque within the proximal left subclavian artery. 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 Right carotid system: CCA and ICA patent within the neck without stenosis. Minimal soft plaque within the carotid bulb. Left carotid system: CCA and ICA patent within the neck without measurable stenosis. Mild mixed plaque within the left carotid bulb. Vertebral arteries: Right vertebral artery dominant and patent throughout the neck without significant stenosis. 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 Distal to this, the left vertebral artery is patent throughout the neck without significant stenosis. Skeleton: No acute bony abnormality. Cervical spondylosis greatest at C5-C6 and C6-C7 where there are small posterior disc osteophytes, uncovertebral and facet hypertrophy. Other neck: No neck mass or cervical lymphadenopathy. Upper chest: No consolidation within the imaged lung apices. Review of the MIP images confirms the above findings CTA HEAD FINDINGS Anterior circulation: The intracranial internal carotid arteries are patent. Calcified plaque within the right paraclinoid segment with no more than  mild stenosis. Calcified plaque also present within the left paraclinoid segment with mild/moderate focal stenosis, slightly progressed from prior CT angiogram 08/08/2017. The bilateral middle and anterior cerebral arteries are patent without proximal branch occlusion or high-grade proximal arterial stenosis. No intracranial aneurysm is identified. Posterior circulation: The intracranial vertebral arteries are patent without significant stenosis, as  is the basilar artery. The bilateral posterior cerebral arteries are patent without proximal branch occlusion or high-grade proximal arterial stenosis. Venous sinuses: Within limitations of contrast timing, no convincing thrombus. Anatomic variants: Posterior communicating arteries are poorly delineated and may be hypoplastic or absent bilaterally. Other: Similar to prior examinations, there is a faint blush of contrast enhancement within the posterior left temporal lobe with associated large draining vein, findings consistent with known with AVM. Review of the MIP images confirms the above findings These results were called by telephone at the time of interpretation on 05/12/2019 at 2:11 pm to provider Dr. Jacqualine Code, who verbally acknowledged these results. IMPRESSION: CTA neck: 1. Bilateral common and internal carotid arteries patent within the neck without significant stenosis. Mild plaque within the bilateral carotid bulbs. 2. 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 As before, the left vertebral artery arises from the pseudoaneurysm with moderate origin stenosis. The vertebral arteries are otherwise patent throughout the neck without significant stenosis. CTA head: 1. No emergent intracranial large vessel occlusion. 2. 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 Mild/moderate focal stenosis of the paraclinoid segment on the left. No more than mild stenosis of the paraclinoid segment on the right. 3.  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 Electronically Signed   By: Kellie Simmering DO   On: 05/12/2019 14:12   CT ANGIO NECK W OR WO CONTRAST  Result Date: 05/12/2019 CLINICAL DATA:  Stroke code. Left-sided weakness, right-sided headache. EXAM: CT ANGIOGRAPHY HEAD AND NECK TECHNIQUE: Multidetector CT imaging of the head and neck was performed using the standard protocol during bolus administration of intravenous contrast. Multiplanar CT image reconstructions and MIPs were obtained to evaluate the vascular anatomy. Carotid stenosis measurements (when applicable) are obtained utilizing NASCET criteria, using the distal internal carotid diameter as the denominator. CONTRAST:  85mL OMNIPAQUE IOHEXOL 350 MG/ML SOLN COMPARISON:  Noncontrast head CT performed concurrently, MRI/MRA head 12/25/2018, CT angiogram head/neck 08/08/2017 FINDINGS: CTA NECK FINDINGS Aortic arch: Standard aortic branching. Mild mixed plaque within the proximal left subclavian artery. 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 Right carotid system: CCA and ICA patent within the neck without stenosis. Minimal soft plaque within the carotid bulb. Left carotid system: CCA and ICA patent within the neck without measurable stenosis. Mild mixed plaque within the left carotid bulb. Vertebral arteries: Right vertebral artery dominant and patent throughout the neck without significant stenosis. 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 Distal to this, the left vertebral artery is patent throughout the neck without significant stenosis. Skeleton: No acute bony abnormality. Cervical spondylosis greatest at C5-C6 and C6-C7 where there are small posterior disc osteophytes, uncovertebral and facet hypertrophy. Other neck: No neck mass or cervical lymphadenopathy. Upper chest: No consolidation within  the imaged lung apices. Review of the MIP images confirms the above findings CTA HEAD FINDINGS Anterior circulation: The intracranial internal carotid arteries are patent. Calcified plaque within the right paraclinoid segment with no more than mild stenosis. Calcified plaque also present within the left paraclinoid segment with mild/moderate focal stenosis, slightly progressed from prior CT angiogram 08/08/2017. The bilateral middle and anterior cerebral arteries are patent without proximal branch occlusion or high-grade proximal arterial stenosis. No intracranial aneurysm is identified. Posterior circulation: The intracranial vertebral arteries are patent without significant stenosis, as is the basilar artery. The bilateral posterior cerebral arteries are patent  without proximal branch occlusion or high-grade proximal arterial stenosis. Venous sinuses: Within limitations of contrast timing, no convincing thrombus. Anatomic variants: Posterior communicating arteries are poorly delineated and may be hypoplastic or absent bilaterally. Other: Similar to prior examinations, there is a faint blush of contrast enhancement within the posterior left temporal lobe with associated large draining vein, findings consistent with known with AVM. Review of the MIP images confirms the above findings These results were called by telephone at the time of interpretation on 05/12/2019 at 2:11 pm to provider Dr. Jacqualine Code, who verbally acknowledged these results. IMPRESSION: CTA neck: 1. Bilateral common and internal carotid arteries patent within the neck without significant stenosis. Mild plaque within the bilateral carotid bulbs. 2. 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 As before, the left vertebral artery arises from the pseudoaneurysm with moderate origin stenosis. The vertebral arteries are otherwise patent throughout the neck without significant stenosis. CTA head: 1. No emergent  intracranial large vessel occlusion. 2. 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 Mild/moderate focal stenosis of the paraclinoid segment on the left. No more than mild stenosis of the paraclinoid segment on the right. 3. 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 Electronically Signed   By: Kellie Simmering DO   On: 05/12/2019 14:12   CT HEAD CODE STROKE WO CONTRAST  Result Date: 05/12/2019 CLINICAL DATA:  Code stroke. Neuro deficit, acute, stroke suspected. Patient reports headache with left-sided weakness for 30 minutes. History of brain bleed and AVM EXAM: CT HEAD WITHOUT CONTRAST TECHNIQUE: Contiguous axial images were obtained from the base of the skull through the vertex without intravenous contrast. COMPARISON:  MRI/MRA head 12/25/2018, head CT 12/25/2018 FINDINGS: Brain: No evidence of acute intracranial hemorrhage. No acute demarcated cortical infarction is identified. 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 Also redemonstrated is a small chronic lacunar infarct within the left cerebellum. Stable background generalized parenchymal atrophy and chronic small vessel ischemic disease. Unchanged small focus of hyperdensity corresponding with a known posterior left temporal lobe AVM (series 3, image 16). No evidence of intracranial mass. No midline shift or extra-axial fluid collection. 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 Vascular: Left temporal lobe AVM as described. No hyperdense vessel. Atherosclerotic calcifications. Skull: Normal. Negative for fracture or focal lesion. Sinuses/Orbits: Visualized orbits demonstrate no acute abnormality. No significant paranasal sinus disease or mastoid effusion at the imaged levels. These results were called by telephone at the time of interpretation on 05/12/2019 at 1:50 pm to provider MARK QUALE , who verbally acknowledged these  results. IMPRESSION: 1. No CT evidence of acute intracranial abnormality. 2. 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 3. Stable generalized parenchymal atrophy and chronic small vessel ischemic disease. 4. Small focus of hyperdensity corresponds with a known small left temporal lobe AVM. 5. 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 Electronically Signed   By: Kellie Simmering DO   On: 05/12/2019 13:51    Pending Labs Unresulted Labs (From admission, onward)    Start     Ordered   05/12/19 1723  Hemoglobin A1c  Once,   STAT    Comments: To assess prior glycemic control    05/12/19 1722   05/12/19 1329  Urine Drug Screen, Qualitative  Once,   STAT     05/12/19 1329   05/12/19 1329  Urinalysis, Routine w reflex microscopic  ONCE - STAT,   STAT  05/12/19 1329   Signed and Held  Hemoglobin A1c  Tomorrow morning,   R     Signed and Held   Signed and Held  Lipid panel  Tomorrow morning,   R    Comments: Fasting    Signed and Held          Vitals/Pain Today's Vitals   05/12/19 1615 05/12/19 1645 05/12/19 1715 05/12/19 1745  BP: 126/78 129/78 120/81 123/81  Pulse: (!) 101 98 96 94  Resp: 15 16 15 16   SpO2: 95% 94% 95% 93%  Weight:      Height:        Isolation Precautions No active isolations  Medications Medications  insulin aspart (novoLOG) injection 0-9 Units (has no administration in time range)  insulin aspart (novoLOG) injection 0-5 Units (has no administration in time range)  iohexol (OMNIPAQUE) 350 MG/ML injection 75 mL (75 mLs Intravenous Contrast Given 05/12/19 1337)  aspirin EC tablet 81 mg (81 mg Oral Given 05/12/19 1449)    Mobility walks Low fall risk   Focused Assessments Neuro Assessment Handoff:  Swallow screen pass? Yes    NIH Stroke Scale ( + Modified Stroke Scale Criteria)  Level of Consciousness (1a.)   : Alert, keenly responsive LOC Questions (1b. )   +: Answers both questions correctly LOC  Commands (1c. )   + : Performs both tasks correctly Best Gaze (2. )  +: Normal Visual (3. )  +: No visual loss Facial Palsy (4. )    : Normal symmetrical movements Motor Arm, Left (5a. )   +: Drift Motor Arm, Right (5b. )   +: No drift Motor Leg, Left (6a. )   +: Drift Motor Leg, Right (6b. )   +: No drift Limb Ataxia (7. ): Absent Sensory (8. )   +: Normal, no sensory loss Best Language (9. )   +: No aphasia Dysarthria (10. ): Mild-to-moderate dysarthria, patient slurs at least some words and, at worst, can be understood with some difficulty Extinction/Inattention (11.)   +: No Abnormality Modified SS Total  +: 2 Complete NIHSS TOTAL: 8 Last date known well: 05/12/19 Last time known well: 1250 Neuro Assessment:   Neuro Checks:      Last Documented NIHSS Modified Score: 2 (05/12/19 1727) Has TPA been given? No If patient is a Neuro Trauma and patient is going to OR before floor call report to [place] nurse: [phone] or [phone]     R Recommendations: See Admitting Provider Note  Report given to:   Additional Notes:

## 2019-05-12 NOTE — Consult Note (Signed)
Referring Physician: Quale    Chief Complaint: Left sided numbness and weakness  HPI: Jon [name] is an 69 y.o. 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  Reports that "it feels like cotton is in the left side of his head".  Also reports the onset of left arm and leg weakness.  Initial NIHSS of 8.     Date last known well: 05/12/2019 Time last known well: Time: 12:50 tPA Given: No: History of hemorrhage  Past Medical History:  Diagnosis Date  . Diabetes mellitus without complication ([place])   . Hypertension   . Pituitary tumor   . TIA (transient ischemic attack)     Past Surgical History:  Procedure Laterality Date  . COLONOSCOPY    . EYE SURGERY    . HERNIA REPAIR      Family history: Brother with HTN and CAD.  Mother with Alzheimer's  Social History:  reports that he has never smoked. He has never used smokeless tobacco. He reports that he does not drink alcohol or use drugs.  Allergies:  Allergies  Allergen Reactions  . Beta Adrenergic Blockers Itching  . Topiramate Nausea And Vomiting    Other reaction(s): Dizziness, Hallucination Other Reaction: violence  . Aspirin Other (See Comments)    Per pt's PCP due to spontaneous bleeding in his brain.  . Benadryl [Diphenhydramine] Hives  . Lisinopril Itching    Other reaction(s): Other (See Comments) Leg pain  . Nabumetone Other (See Comments)    Reaction: unknown  . Valproic Acid Other (See Comments)    Reaction: Headache    Medications: I have reviewed the patient's current medications. Prior to Admission:  Prior to Admission medications   Medication Sig Start Date End Date Taking? Authorizing Provider  acetaminophen (TYLENOL) 500 MG tablet Take by mouth.    [provider]  AJOVY 225  MG/1.5ML SOAJ  01/16/19   [provider]  amitriptyline (ELAVIL) 25 MG tablet  02/25/19   [provider]  amLODipine (NORVASC) 5 MG tablet Take by mouth. 09/06/18 09/06/19  [provider]  ARIPiprazole (ABILIFY) 2 MG tablet Take 2 mg by mouth daily.    [provider]  aspirin 81 MG chewable tablet Chew 1 tablet (81 mg total) by mouth daily. 08/22/18   Salary, Avel Peace, MD  atorvastatin (LIPITOR) 40 MG tablet Take 1 tablet (40 mg total) by mouth daily at 6 PM. 08/21/18   Salary, Avel Peace, MD  butalbital-acetaminophen-caffeine (FIORICET) 50-325-40 MG tablet Take by mouth. 01/12/19   [provider]  carvedilol (COREG) 25 MG tablet Take 25 mg by mouth 2 (two) times daily with a meal.    [provider]  Cholecalciferol (D3-1000) 1000 units tablet Take 1,000 Units by mouth daily.    [provider]  doxepin (SINEQUAN) 10 MG capsule  02/25/19   [provider]  DULoxetine (CYMBALTA) 60 MG capsule Take 60 mg by mouth daily.    [provider]  Erenumab-aooe (AIMOVIG) 140 MG/ML SOAJ Inject into the skin. 02/06/19   [provider]  gabapentin (NEURONTIN) 300 MG capsule Take 600 mg by mouth 2 (two) times daily.     [provider]  glimepiride (AMARYL) 4 MG tablet Take by mouth. 09/06/18 09/06/19  [provider]  hydrOXYzine (ATARAX/VISTARIL) 10 MG tablet Take 1 tablet by mouth 3 (three) times daily as needed for itching.  07/01/17   [provider]  ibuprofen (ADVIL,MOTRIN) 200 MG tablet Take 200 mg by mouth every 6 (six) hours as needed for mild pain.    [provider]  losartan-hydrochlorothiazide (HYZAAR) 100-12.5 MG tablet Take 1 tablet by mouth daily.    [provider]  pantoprazole (PROTONIX) 40 MG tablet Take 40 mg by mouth daily. 06/27/17   [provider]  pioglitazone (ACTOS) 30 MG tablet Take by mouth. 11/08/18 11/08/19  [provider]   prochlorperazine (COMPAZINE) 10 MG tablet TAKE 1 TABLET BY MOUTH EVERY 6 HOURS AS NEEDED FOR NAUSEA FOR UP TO 7 DAYS 02/25/19   [provider]  rosuvastatin (CRESTOR) 10 MG tablet Take by mouth. 11/08/18 11/08/19  [provider]  sucralfate (CARAFATE) 1 GM/10ML suspension Take by mouth. 01/09/19 01/09/20  [provider]  testosterone cypionate (DEPOTESTOSTERONE CYPIONATE) 200 MG/ML injection Inject 200 mg into the muscle every 14 (fourteen) days. 07/29/17   [provider]  traZODone (DESYREL) 50 MG tablet Take 50 mg by mouth at bedtime.     [provider]  vitamin B-12 (CYANOCOBALAMIN) 1000 MCG tablet Take 1,000 mcg by mouth daily.    [provider]    ROS: History obtained from the patient  General ROS: negative for - chills, fatigue, fever, night sweats, weight gain or weight loss Psychological ROS: negative for - behavioral disorder, hallucinations, memory difficulties, mood swings or suicidal ideation Ophthalmic ROS: negative for - blurry vision, double vision, eye pain or loss of vision ENT ROS: negative for - epistaxis, nasal discharge, oral lesions, sore throat, tinnitus or vertigo Allergy and Immunology ROS: negative for - hives or itchy/watery eyes Hematological and Lymphatic ROS: negative for - bleeding problems, bruising or swollen lymph nodes Endocrine ROS: negative for - galactorrhea, hair pattern changes, polydipsia/polyuria or temperature intolerance Respiratory ROS: negative for - cough, hemoptysis, shortness of breath or wheezing Cardiovascular ROS: negative for - chest pain, dyspnea on exertion, edema or irregular heartbeat Gastrointestinal ROS: negative for - abdominal pain, diarrhea, hematemesis, nausea/vomiting or stool incontinence Genito-Urinary ROS: negative for - dysuria, hematuria, incontinence or urinary frequency/urgency Musculoskeletal ROS: negative for - joint swelling or muscular weakness Neurological ROS: as  noted in HPI Dermatological ROS: negative for rash and skin lesion changes  Physical Examination: Blood pressure (!) 154/90, pulse (!) 103, resp. rate 19, height 5\' 8"  (1.727 m), weight 82.9 kg, SpO2 99 %.  HEENT-  Normocephalic, no lesions, without obvious abnormality.  Normal external eye and conjunctiva.  Normal TM's bilaterally.  Normal auditory canals and external ears. Normal external nose, mucus membranes and septum.  Normal pharynx. Cardiovascular- S1, S2 normal, pulses palpable throughout   Lungs- chest clear, no wheezing, rales, normal symmetric air entry Abdomen- soft, non-tender; bowel sounds normal; no masses,  no organomegaly Extremities- no edema Lymph-no adenopathy palpable Musculoskeletal-no joint tenderness, deformity or swelling Skin-warm and dry, no hyperpigmentation, vitiligo, or suspicious lesions  Neurological Examination   Mental Status: Alert, oriented.  Speech fluent but dysarthric.  Able to follow 3 step commands without difficulty. Cranial Nerves: II: Left inferior visual field deficit, pupils equal, round, reactive to light and accommodation III,IV, VI: ptosis not present, extra-ocular motions intact bilaterally V,VII: smile symmetric, facial light touch sensation decreased on the left VIII: hearing normal bilaterally IX,X: gag reflex present XI: bilateral shoulder shrug XII: midline tongue extension Motor: Able  to lift both arms with both drifting, left greater than right.  Left leg unable to be maintained off the bed.  Right leg with drift.   Sensory: Pinprick and light touch decreased on the left upper and lower extremity Deep Tendon Reflexes: Symmetric throughout Plantars: Right: downgoing   Left: downgoing Cerebellar: Normal finger-to-nose testing.  Unable to perform heel-to-shin with either lower extremity Gait: not tested due to safety concerns    Laboratory Studies:  Basic Metabolic Panel: No results for input(s): NA, K, CL, CO2, GLUCOSE,  BUN, CREATININE, CALCIUM, MG, PHOS in the last 168 hours.  Liver Function Tests: No results for input(s): AST, ALT, ALKPHOS, BILITOT, PROT, ALBUMIN in the last 168 hours. No results for input(s): LIPASE, AMYLASE in the last 168 hours. No results for input(s): AMMONIA in the last 168 hours.  CBC: Recent Labs  Lab 05/12/19 1333  WBC 5.7  NEUTROABS 3.0  HGB 15.9  HCT 44.5  MCV 87.6  PLT 201    Cardiac Enzymes: No results for input(s): CKTOTAL, CKMB, CKMBINDEX, TROPONINI in the last 168 hours.  BNP: Invalid input(s): POCBNP  CBG: Recent Labs  Lab 05/12/19 1326  GLUCAP 197*    Microbiology: Results for orders placed or performed during the hospital encounter of 12/25/18  SARS Coronavirus 2 [facility] order, Performed in [facility] hospital lab) Nasopharyngeal Nasopharyngeal Swab     Status: None   Collection Time: 12/25/18  4:25 PM   Specimen: Nasopharyngeal Swab  Result Value Ref Range Status   SARS Coronavirus 2 NEGATIVE NEGATIVE Final    Comment: (NOTE) If result is NEGATIVE SARS-CoV-2 target nucleic acids are NOT DETECTED. The SARS-CoV-2 RNA is generally detectable in upper and lower  respiratory specimens during the acute phase of infection. The lowest  concentration of SARS-CoV-2 viral copies this assay can detect is 250  copies / mL. A negative result does not preclude SARS-CoV-2 infection  and should not be used as the sole basis for treatment or other  patient management decisions.  A negative result may occur with  improper specimen collection / handling, submission of specimen other  than nasopharyngeal swab, presence of viral mutation(s) within the  areas targeted by this assay, and inadequate number of viral copies  (<250 copies / mL). A negative result must be combined with clinical  observations, patient history, and epidemiological information. If result is POSITIVE SARS-CoV-2 target nucleic acids are DETECTED. The SARS-CoV-2 RNA is generally  detectable in upper and lower  respiratory specimens dur ing the acute phase of infection.  Positive  results are indicative of active infection with SARS-CoV-2.  Clinical  correlation with patient history and other diagnostic information is  necessary to determine patient infection status.  Positive results do  not rule out bacterial infection or co-infection with other viruses. If result is PRESUMPTIVE POSTIVE SARS-CoV-2 nucleic acids MAY BE PRESENT.   A presumptive positive result was obtained on the submitted specimen  and confirmed on repeat testing.  While 2019 novel coronavirus  (SARS-CoV-2) nucleic acids may be present in the submitted sample  additional confirmatory testing may be necessary for epidemiological  and / or clinical management purposes  to differentiate between  SARS-CoV-2 and other Sarbecovirus currently known to infect humans.  If clinically indicated additional testing with an alternate test  methodology [phone]) is advised. The SARS-CoV-2 RNA is generally  detectable in upper and lower respiratory sp ecimens during the acute  phase of infection. The expected result is Negative. Fact Sheet for Patients:  StrictlyIdeas.no Fact Sheet for Healthcare Providers: BankingDealers.co.za This test is not yet approved or cleared by the [place] FDA and has been authorized for detection and/or diagnosis of SARS-CoV-2 by FDA under an Emergency Use Authorization (EUA).  This EUA will remain in effect (meaning this test can be used) for the duration of the COVID-19 declaration under Section 564(b)(1) of the Act, 21 U.S.C. section 360bbb-3(b)(1), unless the authorization is terminated or revoked sooner. Performed at [facility], [address] [place],  [address]     Coagulation Studies: No results for input(s): LABPROT, INR in the last 72 hours.  Urinalysis: No results for input(s): COLORURINE,  LABSPEC, PHURINE, GLUCOSEU, HGBUR, BILIRUBINUR, KETONESUR, PROTEINUR, UROBILINOGEN, NITRITE, LEUKOCYTESUR in the last 168 hours.  Invalid input(s): APPERANCEUR  Lipid Panel:    Component Value Date/Time   CHOL 220 (H) 08/20/2018 0401   TRIG 394 (H) 08/20/2018 0401   HDL 27 (L) 08/20/2018 0401   CHOLHDL 8.1 08/20/2018 0401   VLDL 79 (H) 08/20/2018 0401   LDLCALC 114 (H) 08/20/2018 0401    HgbA1C:  Lab Results  Component Value Date   HGBA1C 7.5 (H) 08/20/2018    Urine Drug Screen:      Component Value Date/Time   LABOPIA NONE DETECTED 08/19/2018 1704   COCAINSCRNUR NONE DETECTED 08/19/2018 1704   LABBENZ NONE DETECTED 08/19/2018 1704   AMPHETMU NONE DETECTED 08/19/2018 1704   THCU NONE DETECTED 08/19/2018 1704   LABBARB POSITIVE (A) 08/19/2018 1704    Alcohol Level: No results for input(s): ETH in the last 168 hours.  Other results: EKG: sinus tachycardia at 107 bpm with incomplete LBBB.  Imaging: CT HEAD CODE STROKE WO CONTRAST  Result Date: 05/12/2019 CLINICAL DATA:  Code stroke. Neuro deficit, acute, stroke suspected. Patient reports headache with left-sided weakness for 30 minutes. History of brain bleed and AVM EXAM: CT HEAD WITHOUT CONTRAST TECHNIQUE: Contiguous axial images were obtained from the base of the skull through the vertex without intravenous contrast. COMPARISON:  MRI/MRA head 12/25/2018, head CT 12/25/2018 FINDINGS: Brain: No evidence of acute intracranial hemorrhage. No acute demarcated cortical infarction is identified. 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 Also redemonstrated is a small chronic lacunar infarct within the left cerebellum. Stable background generalized parenchymal atrophy and chronic small vessel ischemic disease. Unchanged small focus of hyperdensity corresponding with a known posterior left temporal lobe AVM (series 3, image 16). No evidence of intracranial mass. No midline shift  or extra-axial fluid collection. 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 Vascular: Left temporal lobe AVM as described. No hyperdense vessel. Atherosclerotic calcifications. Skull: Normal. Negative for fracture or focal lesion. Sinuses/Orbits: Visualized orbits demonstrate no acute abnormality. No significant paranasal sinus disease or mastoid effusion at the imaged levels. These results were called by telephone at the time of interpretation on 05/12/2019 at 1:50 pm to provider MARK QUALE , who verbally acknowledged these results. IMPRESSION: 1. No CT evidence of acute intracranial abnormality. 2. 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 3. Stable generalized parenchymal atrophy and chronic small vessel ischemic disease. 4. Small focus of hyperdensity corresponds with a known small left temporal lobe AVM. 5. 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 Electronically Signed   By: Kellie Simmering DO   On: 05/12/2019 13:51    Assessment: 69 y.o. 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  Patient on statin and ASA prior to admission.  Head CT reviewed and shows no acute changes.  CTA shows no evidence of LVO.  Patient not a candidate for tPA due to history of cerebral hemorrhage in the past.  No interventionable lesion identified.  TO be admitted for further work up and management.    Stroke Risk Factors - diabetes mellitus and hypertension  Plan: 1. HgbA1c, fasting lipid panel 2. MRI of the brain without contrast 3. PT consult, OT consult, Speech consult 4. Echocardiogram 5. Prophylactic therapy-Continue ASA 6. NPO until RN stroke swallow screen 7. Telemetry monitoring 8. Frequent neuro checks 9. EEG  Alexis Goodell, MD Neurology [phone] 05/12/2019, 2:08  PM

## 2019-05-12 NOTE — ED Notes (Signed)
Pt requesting to leave - had dr Jacqualine Code go in and speak to him and he changed his mind. (pt told dr he felt like he was "just taking up room"). Pt was able to raise his left arm now and is speaking much clearer.

## 2019-05-13 ENCOUNTER — Observation Stay: Admission: EM | Admit: 2019-05-13 | Payer: Medicare Other | Source: Home / Self Care | Admitting: Internal Medicine

## 2019-05-13 ENCOUNTER — Inpatient Hospital Stay (HOSPITAL_COMMUNITY)
Admission: EM | Admit: 2019-05-13 | Discharge: 2019-05-13 | Disposition: A | Discharge status: Home / Self Care | Payer: Medicare Other | Source: Home / Self Care | Attending: Internal Medicine | Admitting: Internal Medicine

## 2019-05-13 DIAGNOSIS — I6389 Other cerebral infarction: Secondary | ICD-10-CM | POA: Diagnosis not present

## 2019-05-13 DIAGNOSIS — Z833 Family history of diabetes mellitus: Secondary | ICD-10-CM | POA: Diagnosis not present

## 2019-05-13 DIAGNOSIS — F329 Major depressive disorder, single episode, unspecified: Secondary | ICD-10-CM | POA: Diagnosis present

## 2019-05-13 DIAGNOSIS — Z79899 Other long term (current) drug therapy: Secondary | ICD-10-CM | POA: Diagnosis not present

## 2019-05-13 DIAGNOSIS — R29708 NIHSS score 8: Secondary | ICD-10-CM | POA: Diagnosis present

## 2019-05-13 DIAGNOSIS — Z7982 Long term (current) use of aspirin: Secondary | ICD-10-CM | POA: Diagnosis not present

## 2019-05-13 DIAGNOSIS — I639 Cerebral infarction, unspecified: Secondary | ICD-10-CM | POA: Diagnosis not present

## 2019-05-13 DIAGNOSIS — R29898 Other symptoms and signs involving the musculoskeletal system: Secondary | ICD-10-CM | POA: Diagnosis not present

## 2019-05-13 DIAGNOSIS — I10 Essential (primary) hypertension: Secondary | ICD-10-CM | POA: Diagnosis present

## 2019-05-13 DIAGNOSIS — Z7984 Long term (current) use of oral hypoglycemic drugs: Secondary | ICD-10-CM | POA: Diagnosis not present

## 2019-05-13 DIAGNOSIS — E782 Mixed hyperlipidemia: Secondary | ICD-10-CM | POA: Diagnosis present

## 2019-05-13 DIAGNOSIS — I69354 Hemiplegia and hemiparesis following cerebral infarction affecting left non-dominant side: Secondary | ICD-10-CM | POA: Diagnosis not present

## 2019-05-13 DIAGNOSIS — Z20822 Contact with and (suspected) exposure to covid-19: Secondary | ICD-10-CM | POA: Diagnosis present

## 2019-05-13 DIAGNOSIS — K219 Gastro-esophageal reflux disease without esophagitis: Secondary | ICD-10-CM | POA: Diagnosis present

## 2019-05-13 DIAGNOSIS — R531 Weakness: Secondary | ICD-10-CM | POA: Diagnosis present

## 2019-05-13 DIAGNOSIS — E119 Type 2 diabetes mellitus without complications: Secondary | ICD-10-CM | POA: Diagnosis present

## 2019-05-13 DIAGNOSIS — I6381 Other cerebral infarction due to occlusion or stenosis of small artery: Secondary | ICD-10-CM | POA: Diagnosis present

## 2019-05-13 LAB — HEMOGLOBIN A1C
Hgb A1c MFr Bld: 6.9 % — ABNORMAL HIGH (ref 4.8–5.6)
Mean Plasma Glucose: 151.33 mg/dL

## 2019-05-13 LAB — CBC
HCT: 41.3 % (ref 39.0–52.0)
Hemoglobin: 14 g/dL (ref 13.0–17.0)
MCH: 31.4 pg (ref 26.0–34.0)
MCHC: 33.9 g/dL (ref 30.0–36.0)
MCV: 92.6 fL (ref 80.0–100.0)
Platelets: 178 10*3/uL (ref 150–400)
RBC: 4.46 MIL/uL (ref 4.22–5.81)
RDW: 12.8 % (ref 11.5–15.5)
WBC: 4.2 10*3/uL (ref 4.0–10.5)
nRBC: 0 % (ref 0.0–0.2)

## 2019-05-13 LAB — URINE DRUG SCREEN, QUALITATIVE (ARMC ONLY)
Amphetamines, Ur Screen: NOT DETECTED
Barbiturates, Ur Screen: POSITIVE — AB
Benzodiazepine, Ur Scrn: NOT DETECTED
Cannabinoid 50 Ng, Ur ~~LOC~~: NOT DETECTED
Cocaine Metabolite,Ur ~~LOC~~: NOT DETECTED
MDMA (Ecstasy)Ur Screen: NOT DETECTED
Methadone Scn, Ur: NOT DETECTED
Opiate, Ur Screen: NOT DETECTED
Phencyclidine (PCP) Ur S: NOT DETECTED
Tricyclic, Ur Screen: POSITIVE — AB

## 2019-05-13 LAB — URINALYSIS, ROUTINE W REFLEX MICROSCOPIC
Bilirubin Urine: NEGATIVE
Glucose, UA: NEGATIVE mg/dL
Hgb urine dipstick: NEGATIVE
Ketones, ur: NEGATIVE mg/dL
Leukocytes,Ua: NEGATIVE
Nitrite: NEGATIVE
Protein, ur: NEGATIVE mg/dL
Specific Gravity, Urine: 1.021 (ref 1.005–1.030)
pH: 6 (ref 5.0–8.0)

## 2019-05-13 LAB — BASIC METABOLIC PANEL
Anion gap: 8 (ref 5–15)
BUN: 13 mg/dL (ref 8–23)
CO2: 25 mmol/L (ref 22–32)
Calcium: 8.4 mg/dL — ABNORMAL LOW (ref 8.9–10.3)
Chloride: 103 mmol/L (ref 98–111)
Creatinine, Ser: 0.89 mg/dL (ref 0.61–1.24)
GFR calc Af Amer: >60 mL/min (ref >60)
GFR calc non Af Amer: >60 mL/min (ref >60)
Glucose, Bld: 107 mg/dL — ABNORMAL HIGH (ref 70–99)
Potassium: 3.9 mmol/L (ref 3.5–5.1)
Sodium: 136 mmol/L (ref 135–145)

## 2019-05-13 LAB — GLUCOSE, CAPILLARY
Glucose-Capillary: 102 mg/dL — ABNORMAL HIGH (ref 70–99)
Glucose-Capillary: 160 mg/dL — ABNORMAL HIGH (ref 70–99)
Glucose-Capillary: 98 mg/dL (ref 70–99)
Glucose-Capillary: 137 mg/dL — ABNORMAL HIGH (ref 70–99)

## 2019-05-13 LAB — LIPID PANEL
Cholesterol: 185 mg/dL (ref 0–200)
HDL: 30 mg/dL — ABNORMAL LOW (ref >40)
LDL Cholesterol: 96 mg/dL (ref 0–99)
Total CHOL/HDL Ratio: 6.2 RATIO
Triglycerides: 296 mg/dL — ABNORMAL HIGH (ref <150)
VLDL: 59 mg/dL — ABNORMAL HIGH (ref 0–40)

## 2019-05-13 LAB — ECHOCARDIOGRAM COMPLETE
Height: 68 in
Weight: 2924.8 oz

## 2019-05-13 NOTE — Progress Notes (Signed)
Subjective: Improved today but no yet back to baseline.  Headache resolved.    Objective: Current vital signs: BP (!) 140/99 (BP Location: Right Arm)   Pulse 76   Temp 97.7 F (36.5 C) (Oral)   Resp 18   Ht 5\' 8"  (1.727 m)   Wt 82.9 kg   SpO2 99%   BMI 27.79 kg/m  Vital signs in last 24 hours: Temp:  [97.7 F (36.5 C)-98.5 F (36.9 C)] 97.7 F (36.5 C) (01/03 0813) Pulse Rate:  [70-103] 76 (01/03 0813) Resp:  [10-29] 18 (01/03 0813) BP: (120-172)/(78-99) 140/99 (01/03 0813) SpO2:  [88 %-99 %] 99 % (01/03 0813) Weight:  [82.9 kg] 82.9 kg (01/02 1351)  Intake/Output from previous day: 01/02 0701 - 01/03 0700 In: 627.6 [I.V.:627.6] Out: 550 [Urine:550] Intake/Output this shift: No intake/output data recorded. Nutritional status:  Diet Order            Diet heart healthy/carb modified Room service appropriate? Yes; Fluid consistency: Thin  Diet effective now              Neurologic Exam: Mental Status: Alert, oriented, thought content appropriate.  Speech fluent without evidence of aphasia.  Able to follow 3 step commands without difficulty. Cranial Nerves: II: Left inferior visual field deficit, pupils equal, round, reactive to light and accommodation III,IV, VI: ptosis not present, extra-ocular motions intact bilaterally V,VII: smile symmetric, facial light touch sensation normal bilaterally VIII: hearing normal bilaterally IX,X: gag reflex present XI: bilateral shoulder shrug XII: midline tongue extension Motor: Right : Upper extremity   5/5    Left:     Upper extremity   5/5  Lower extremity   5/5     Lower extremity   5-/5 Tone and bulk:normal tone throughout; no atrophy noted Sensory: Pinprick and light touch decreased in the LUE  Lab Results: Basic Metabolic Panel: Recent Labs  Lab 05/12/19 1333  NA 137  K 4.0  CL 102  CO2 21*  GLUCOSE 189*  BUN 13  CREATININE 1.02  CALCIUM 9.3    Liver Function Tests: Recent Labs  Lab 05/12/19 1333   AST 41  ALT 39  ALKPHOS 73  BILITOT 1.2  PROT 7.8  ALBUMIN 4.8   No results for input(s): LIPASE, AMYLASE in the last 168 hours. No results for input(s): AMMONIA in the last 168 hours.  CBC: Recent Labs  Lab 05/12/19 1333 05/13/19 0506  WBC 5.7 4.2  NEUTROABS 3.0  --   HGB 15.9 14.0  HCT 44.5 41.3  MCV 87.6 92.6  PLT 201 178    Cardiac Enzymes: No results for input(s): CKTOTAL, CKMB, CKMBINDEX, TROPONINI in the last 168 hours.  Lipid Panel: Recent Labs  Lab 05/13/19 0506  CHOL 185  TRIG 296*  HDL 30*  CHOLHDL 6.2  VLDL 59*  LDLCALC 96    CBG: Recent Labs  Lab 05/12/19 1326 05/12/19 1953 05/12/19 2043 05/13/19 0812  GLUCAP 197* 160* 145* 102*    Microbiology: Results for orders placed or performed during the hospital encounter of 05/12/19  Respiratory Panel by RT PCR (Flu A&B, Covid) - Nasopharyngeal Swab     Status: None   Collection Time: 05/12/19  1:33 PM   Specimen: Nasopharyngeal Swab  Result Value Ref Range Status   SARS Coronavirus 2 by RT PCR NEGATIVE NEGATIVE Final    Comment: (NOTE) SARS-CoV-2 target nucleic acids are NOT DETECTED. The SARS-CoV-2 RNA is generally detectable in upper respiratoy specimens during the acute phase of infection.  The lowest concentration of SARS-CoV-2 viral copies this assay can detect is 131 copies/mL. A negative result does not preclude SARS-Cov-2 infection and should not be used as the sole basis for treatment or other patient management decisions. A negative result may occur with  improper specimen collection/handling, submission of specimen other than nasopharyngeal swab, presence of viral mutation(s) within the areas targeted by this assay, and inadequate number of viral copies (<131 copies/mL). A negative result must be combined with clinical observations, patient history, and epidemiological information. The expected result is Negative. Fact Sheet for Patients:   PinkCheek.be Fact Sheet for Healthcare Providers:  GravelBags.it This test is not yet ap proved or cleared by the [place] FDA and  has been authorized for detection and/or diagnosis of SARS-CoV-2 by FDA under an Emergency Use Authorization (EUA). This EUA will remain  in effect (meaning this test can be used) for the duration of the COVID-19 declaration under Section 564(b)(1) of the Act, 21 U.S.C. section 360bbb-3(b)(1), unless the authorization is terminated or revoked sooner.    Influenza A by PCR NEGATIVE NEGATIVE Final   Influenza B by PCR NEGATIVE NEGATIVE Final    Comment: (NOTE) The Xpert Xpress SARS-CoV-2/FLU/RSV assay is intended as an aid in  the diagnosis of influenza from Nasopharyngeal swab specimens and  should not be used as a sole basis for treatment. Nasal washings and  aspirates are unacceptable for Xpert Xpress SARS-CoV-2/FLU/RSV  testing. Fact Sheet for Patients: PinkCheek.be Fact Sheet for Healthcare Providers: GravelBags.it This test is not yet approved or cleared by the [place] FDA and  has been authorized for detection and/or diagnosis of SARS-CoV-2 by  FDA under an Emergency Use Authorization (EUA). This EUA will remain  in effect (meaning this test can be used) for the duration of the  Covid-19 declaration under Section 564(b)(1) of the Act, 21  U.S.C. section 360bbb-3(b)(1), unless the authorization is  terminated or revoked. Performed at [facility], [address] [place], [place] [address]     Coagulation Studies: Recent Labs    05/12/19 1333  LABPROT 12.5  INR 0.9    Imaging: CT ANGIO HEAD W OR WO CONTRAST  Result Date: 05/12/2019 CLINICAL DATA:  Stroke code. Left-sided weakness, right-sided headache. EXAM: CT ANGIOGRAPHY HEAD AND NECK TECHNIQUE: Multidetector CT imaging of the head and neck was  performed using the standard protocol during bolus administration of intravenous contrast. Multiplanar CT image reconstructions and MIPs were obtained to evaluate the vascular anatomy. Carotid stenosis measurements (when applicable) are obtained utilizing NASCET criteria, using the distal internal carotid diameter as the denominator. CONTRAST:  71mL OMNIPAQUE IOHEXOL 350 MG/ML SOLN COMPARISON:  Noncontrast head CT performed concurrently, MRI/MRA head 12/25/2018, CT angiogram head/neck 08/08/2017 FINDINGS: CTA NECK FINDINGS Aortic arch: Standard aortic branching. Mild mixed plaque within the proximal left subclavian artery. 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 Right carotid system: CCA and ICA patent within the neck without stenosis. Minimal soft plaque within the carotid bulb. Left carotid system: CCA and ICA patent within the neck without measurable stenosis. Mild mixed plaque within the left carotid bulb. Vertebral arteries: Right vertebral artery dominant and patent throughout the neck without significant stenosis. 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 Distal to this, the left vertebral artery is patent throughout the neck without significant stenosis. Skeleton: No acute bony abnormality. Cervical spondylosis greatest at C5-C6 and C6-C7 where  there are small posterior disc osteophytes, uncovertebral and facet hypertrophy. Other neck: No neck mass or cervical lymphadenopathy. Upper chest: No consolidation within the imaged lung apices. Review of the MIP images confirms the above findings CTA HEAD FINDINGS Anterior circulation: The intracranial internal carotid arteries are patent. Calcified plaque within the right paraclinoid segment with no more than mild stenosis. Calcified plaque also present within the left paraclinoid segment with mild/moderate focal stenosis,  slightly progressed from prior CT angiogram 08/08/2017. The bilateral middle and anterior cerebral arteries are patent without proximal branch occlusion or high-grade proximal arterial stenosis. No intracranial aneurysm is identified. Posterior circulation: The intracranial vertebral arteries are patent without significant stenosis, as is the basilar artery. The bilateral posterior cerebral arteries are patent without proximal branch occlusion or high-grade proximal arterial stenosis. Venous sinuses: Within limitations of contrast timing, no convincing thrombus. Anatomic variants: Posterior communicating arteries are poorly delineated and may be hypoplastic or absent bilaterally. Other: Similar to prior examinations, there is a faint blush of contrast enhancement within the posterior left temporal lobe with associated large draining vein, findings consistent with known with AVM. Review of the MIP images confirms the above findings These results were called by telephone at the time of interpretation on 05/12/2019 at 2:11 pm to provider Dr. Jacqualine Code, who verbally acknowledged these results. IMPRESSION: CTA neck: 1. Bilateral common and internal carotid arteries patent within the neck without significant stenosis. Mild plaque within the bilateral carotid bulbs. 2. 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 As before, the left vertebral artery arises from the pseudoaneurysm with moderate origin stenosis. The vertebral arteries are otherwise patent throughout the neck without significant stenosis. CTA head: 1. No emergent intracranial large vessel occlusion. 2. 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 Mild/moderate focal stenosis of the paraclinoid segment on the left. No more than mild stenosis of the paraclinoid segment on the right. 3. 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 Electronically Signed   By: Kellie Simmering DO   On:  05/12/2019 14:12   CT ANGIO NECK W OR WO CONTRAST  Result Date: 05/12/2019 CLINICAL DATA:  Stroke code. Left-sided weakness, right-sided headache. EXAM: CT ANGIOGRAPHY HEAD AND NECK TECHNIQUE: Multidetector CT imaging of the head and neck was performed using the standard protocol during bolus administration of intravenous contrast. Multiplanar CT image reconstructions and MIPs were obtained to evaluate the vascular anatomy. Carotid stenosis measurements (when applicable) are obtained utilizing NASCET criteria, using the distal internal carotid diameter as the denominator. CONTRAST:  57mL OMNIPAQUE IOHEXOL 350 MG/ML SOLN COMPARISON:  Noncontrast head CT performed concurrently, MRI/MRA head 12/25/2018, CT angiogram head/neck 08/08/2017 FINDINGS: CTA NECK FINDINGS Aortic arch: Standard aortic branching. Mild mixed plaque within the proximal left subclavian artery. 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 Right carotid system: CCA and ICA patent within the neck without stenosis. Minimal soft plaque within the carotid bulb. Left carotid system: CCA and ICA patent within the neck without measurable stenosis. Mild mixed plaque within the left carotid bulb. Vertebral arteries: Right vertebral artery dominant and patent throughout the neck without significant stenosis. 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 Distal to this, the left vertebral artery is patent throughout the neck without significant stenosis. Skeleton: No acute bony abnormality. Cervical spondylosis greatest at C5-C6 and C6-C7 where there are small posterior disc osteophytes, uncovertebral and facet hypertrophy. Other  neck: No neck mass or cervical lymphadenopathy. Upper chest: No consolidation within the imaged lung apices. Review of the MIP images confirms the above findings CTA HEAD FINDINGS Anterior circulation:  The intracranial internal carotid arteries are patent. Calcified plaque within the right paraclinoid segment with no more than mild stenosis. Calcified plaque also present within the left paraclinoid segment with mild/moderate focal stenosis, slightly progressed from prior CT angiogram 08/08/2017. The bilateral middle and anterior cerebral arteries are patent without proximal branch occlusion or high-grade proximal arterial stenosis. No intracranial aneurysm is identified. Posterior circulation: The intracranial vertebral arteries are patent without significant stenosis, as is the basilar artery. The bilateral posterior cerebral arteries are patent without proximal branch occlusion or high-grade proximal arterial stenosis. Venous sinuses: Within limitations of contrast timing, no convincing thrombus. Anatomic variants: Posterior communicating arteries are poorly delineated and may be hypoplastic or absent bilaterally. Other: Similar to prior examinations, there is a faint blush of contrast enhancement within the posterior left temporal lobe with associated large draining vein, findings consistent with known with AVM. Review of the MIP images confirms the above findings These results were called by telephone at the time of interpretation on 05/12/2019 at 2:11 pm to provider Dr. Jacqualine Code, who verbally acknowledged these results. IMPRESSION: CTA neck: 1. Bilateral common and internal carotid arteries patent within the neck without significant stenosis. Mild plaque within the bilateral carotid bulbs. 2. 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 As before, the left vertebral artery arises from the pseudoaneurysm with moderate origin stenosis. The vertebral arteries are otherwise patent throughout the neck without significant stenosis. CTA head: 1. No emergent intracranial large vessel occlusion. 2. 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  Mild/moderate focal stenosis of the paraclinoid segment on the left. No more than mild stenosis of the paraclinoid segment on the right. 3. 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 Electronically Signed   By: Kellie Simmering DO   On: 05/12/2019 14:12   MR BRAIN WO CONTRAST  Result Date: 05/12/2019 CLINICAL DATA:  Acute presentation with unusual headache. Left arm and leg weakness. History of pituitary tumor. EXAM: MRI HEAD WITHOUT CONTRAST TECHNIQUE: Multiplanar, multiecho pulse sequences of the brain and surrounding structures were obtained without intravenous contrast. COMPARISON:  CT studies same day.  MRI 12/25/2018. FINDINGS: Brain: Diffusion imaging shows a punctate acute infarction in the medial posterior parietal lobe on the left. No other acute or subacute infarction. No brainstem abnormality. Old small vessel infarction in the left cerebellum. Cerebral hemispheres show an old infarction in the left posterior parietal lobe with some hemosiderin deposition. This was recent on the study August. Small old bilateral occipital infarctions. Moderate to marked chronic small-vessel ischemic changes elsewhere throughout the cerebral hemispheric white matter. Old focus of hemosiderin deposition in the right frontal lobe. Scattered other small foci of hemosiderin deposition associated with some of the old infarctions. 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 This is unchanged when compared to a study from March 2019. Vascular: Major vessels at the base of the brain show flow. Skull and upper cervical spine: Negative Sinuses/Orbits: Clear/normal Other: None IMPRESSION: Punctate acute infarction in the medial posterior left parietal lobe. Expected evolutionary changes cortical infarction in the left posterior parietal lobe which was  recent on the study of August. Hemosiderin deposition in that region. Other  small occipital infarctions bilaterally and in the right frontal lobe as seen previously. Extensive chronic small-vessel ischemic change throughout the brain as outlined above, similar to the previous studies. Numerous foci of hemosiderin deposition associated with some of the old insults. No change in a left lateral temporal pial dural arteriovenous malformation without evidence of hemorrhage. No change in a cystic pituitary macro adenoma measuring up to 13 mm. Electronically Signed   By: Nelson Chimes M.D.   On: 05/12/2019 20:42   CT HEAD CODE STROKE WO CONTRAST  Result Date: 05/12/2019 CLINICAL DATA:  Code stroke. Neuro deficit, acute, stroke suspected. Patient reports headache with left-sided weakness for 30 minutes. History of brain bleed and AVM EXAM: CT HEAD WITHOUT CONTRAST TECHNIQUE: Contiguous axial images were obtained from the base of the skull through the vertex without intravenous contrast. COMPARISON:  MRI/MRA head 12/25/2018, head CT 12/25/2018 FINDINGS: Brain: No evidence of acute intracranial hemorrhage. No acute demarcated cortical infarction is identified. 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 Also redemonstrated is a small chronic lacunar infarct within the left cerebellum. Stable background generalized parenchymal atrophy and chronic small vessel ischemic disease. Unchanged small focus of hyperdensity corresponding with a known posterior left temporal lobe AVM (series 3, image 16). No evidence of intracranial mass. No midline shift or extra-axial fluid collection. 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 Vascular: Left temporal lobe AVM as described. No hyperdense vessel. Atherosclerotic calcifications. Skull: Normal. Negative for fracture or focal lesion. Sinuses/Orbits: Visualized orbits demonstrate no acute abnormality. No  significant paranasal sinus disease or mastoid effusion at the imaged levels. These results were called by telephone at the time of interpretation on 05/12/2019 at 1:50 pm to provider MARK QUALE , who verbally acknowledged these results. IMPRESSION: 1. No CT evidence of acute intracranial abnormality. 2. 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 3. Stable generalized parenchymal atrophy and chronic small vessel ischemic disease. 4. Small focus of hyperdensity corresponds with a known small left temporal lobe AVM. 5. 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 Electronically Signed   By: Kellie Simmering DO   On: 05/12/2019 13:51    Medications:  I have reviewed the patient's current medications. Scheduled: .  stroke: mapping our early stages of recovery book   Does not apply Once  . amLODipine  5 mg Oral Daily  . aspirin EC  81 mg Oral Daily  . carbidopa-levodopa  1 tablet Oral TID  . cholecalciferol  1,000 Units Oral Daily  . doxepin  10 mg Oral QHS  . DULoxetine  60 mg Oral Daily  . enoxaparin (LOVENOX) injection  40 mg Subcutaneous Q24H  . losartan  100 mg Oral Daily   And  . hydrochlorothiazide  12.5 mg Oral Daily  . insulin aspart  0-5 Units Subcutaneous QHS  . insulin aspart  0-9 Units Subcutaneous TID WC  . pantoprazole  40 mg Oral Daily  . rosuvastatin  10 mg Oral Daily  . traZODone  50 mg Oral QHS    Assessment/Plan: 69 y.o. 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  Patient on statin and ASA prior to admission.  Head CT reviewed and shows no acute changes.  CTA shows no evidence of LVO.  Patient not a candidate for tPA due to history of cerebral  hemorrhage in the past.  No interventionable lesion identified.   In work up MRI of the brain performed and reviewed.  MRI  reveals a punctate acute medial posterior left parietal lobe infarct. Chronic changes noted as well.  This does not explain the patient's presentation.  Concerning for embolic etiology.  Patient with multiple echos in the past.  No cardiac etiology identified.  Repeat echo pending.  LDL 96.  A1c pending.  Recommendations: 1. A1c pending.  Target is <7.0. 2. Aggressive lipid management with target LDL<70 3. Would continue on ASA 81mg  daily.  Will refrain from [place] at this time with history of hemorrhage (possibly recurrent) in the past. 4. Echocardiogram pending 5. Telemetry 6. Frequent neuro checks 7. If above unremarkable patient to continue follow up with Dr. Manuella Ghazi on an outpatient basis 8. EEG pending.  May be performed as an outpatient if necessary     LOS: 0 days   Alexis Goodell, MD Neurology [phone] 05/13/2019  8:48 AM

## 2019-05-13 NOTE — Progress Notes (Addendum)
BP 121/84. Pt on IVF. Hydrochlorothiazide, norvasc and losartan due.  Notified Dr Jimmye Norman of the above.  Per MD to hold hydrochlorothiazide.

## 2019-05-13 NOTE — Progress Notes (Signed)
*  PRELIMINARY RESULTS* Echocardiogram 2D Echocardiogram has been performed.  Jon [name] Jon [name] 05/13/2019, 3:48 PM

## 2019-05-13 NOTE — Progress Notes (Signed)
PROGRESS NOTE    Jon [name]  [id number] DOB: May 23, 1950 DOA: 05/12/2019 PCP: Idelle Crouch, MD       Assessment & Plan:   Principal Problem:   Left arm weakness Active Problems:   HTN (hypertension)   GERD (gastroesophageal reflux disease)   History of stroke   Hyperlipidemia, mixed   Hypertension   Diabetes mellitus without complication (HCC)   Depression  CVA: punctate acute infarction in the medial posterior left parietal lobe as per MRI brain. Hx of CVA w/ left sided weakness w/ left arm weakness that has worsened. Neuro following & recs. Continue neuro checks. Continue aspirin. Echo, EEG ordered. PT/OT recommending SNF but pt and pt's wife are refusing SNF but agree to home health. Home health orders place  Essential hypertension: continue on amlodipine, losartan, & HCTZ  GERD: continue on PPI  Hyperlipidemia mixed: continue on statin   DM2: Last A1c 7.5 on 08/20/18. Hold home dose of actos and amaryl. Continue on SSI w/ accuchecks   Depression: severity unknown. Continue on home dose of duloxetine    DVT prophylaxis: lovenox Code Status: full  Family Communication: discussed pt's care w/ pt's wife who is at bedside and answered all of her questions. Pt and pt's wife do not want SNF but are ok w/ home health. Home health orders placed Disposition Plan:    Consultants:  Neuro   Procedures: n/a   Antimicrobials: n/a   Subjective: Pt c/o left sided weakness   Objective: Vitals:   05/12/19 2359 05/13/19 0159 05/13/19 0405 05/13/19 0609  BP: 123/83 121/87 125/87 125/88  Pulse: 85 82 86 70  Resp: 16 12 16 16   Temp: 98.2 F (36.8 C) 97.8 F (36.6 C) 98.1 F (36.7 C) 98.4 F (36.9 C)  TempSrc: Oral Oral Oral Oral  SpO2: 97% 97% 97% 97%  Weight:      Height:        Intake/Output Summary (Last 24 hours) at 05/13/2019 0723 Last data filed at 05/13/2019 0617 Gross per 24 hour  Intake 627.62 ml  Output 550 ml  Net 77.62 ml   Filed  Weights   05/12/19 1351  Weight: 82.9 kg    Examination:  General exam: Appears calm and comfortable. Sitting in a chair Respiratory system: Clear to auscultation. Respiratory effort normal. Cardiovascular system: S1 & S2 +. No rubs, gallops or clicks.  Gastrointestinal system: Abdomen is nondistended, soft and nontender. Normal bowel sounds heard. Central nervous system: Alert and oriented. Left UE & LE weakness  Psychiatry: Judgement and insight appear normal. Flat mood and affect.     Data Reviewed: I have personally reviewed following labs and imaging studies  CBC: Recent Labs  Lab 05/12/19 1333  WBC 5.7  NEUTROABS 3.0  HGB 15.9  HCT 44.5  MCV 87.6  PLT 123456   Basic Metabolic Panel: Recent Labs  Lab 05/12/19 1333  NA 137  K 4.0  CL 102  CO2 21*  GLUCOSE 189*  BUN 13  CREATININE 1.02  CALCIUM 9.3   GFR: Estimated Creatinine Clearance: 71.7 mL/min (by C-G formula based on SCr of 1.02 mg/dL). Liver Function Tests: Recent Labs  Lab 05/12/19 1333  AST 41  ALT 39  ALKPHOS 73  BILITOT 1.2  PROT 7.8  ALBUMIN 4.8   No results for input(s): LIPASE, AMYLASE in the last 168 hours. No results for input(s): AMMONIA in the last 168 hours. Coagulation Profile: Recent Labs  Lab 05/12/19 1333  INR 0.9   Cardiac  Enzymes: No results for input(s): CKTOTAL, CKMB, CKMBINDEX, TROPONINI in the last 168 hours. BNP (last 3 results) No results for input(s): PROBNP in the last 8760 hours. HbA1C: No results for input(s): HGBA1C in the last 72 hours. CBG: Recent Labs  Lab 05/12/19 1326 05/12/19 1953 05/12/19 2043  GLUCAP 197* 160* 145*   Lipid Profile: Recent Labs    05/13/19 0506  CHOL 185  HDL 30*  LDLCALC 96  TRIG 296*  CHOLHDL 6.2   Thyroid Function Tests: No results for input(s): TSH, T4TOTAL, FREET4, T3FREE, THYROIDAB in the last 72 hours. Anemia Panel: No results for input(s): VITAMINB12, FOLATE, FERRITIN, TIBC, IRON, RETICCTPCT in the last 72  hours. Sepsis Labs: No results for input(s): PROCALCITON, LATICACIDVEN in the last 168 hours.  Recent Results (from the past 240 hour(s))  Respiratory Panel by RT PCR (Flu A&B, Covid) - Nasopharyngeal Swab     Status: None   Collection Time: 05/12/19  1:33 PM   Specimen: Nasopharyngeal Swab  Result Value Ref Range Status   SARS Coronavirus 2 by RT PCR NEGATIVE NEGATIVE Final    Comment: (NOTE) SARS-CoV-2 target nucleic acids are NOT DETECTED. The SARS-CoV-2 RNA is generally detectable in upper respiratoy specimens during the acute phase of infection. The lowest concentration of SARS-CoV-2 viral copies this assay can detect is 131 copies/mL. A negative result does not preclude SARS-Cov-2 infection and should not be used as the sole basis for treatment or other patient management decisions. A negative result may occur with  improper specimen collection/handling, submission of specimen other than nasopharyngeal swab, presence of viral mutation(s) within the areas targeted by this assay, and inadequate number of viral copies (<131 copies/mL). A negative result must be combined with clinical observations, patient history, and epidemiological information. The expected result is Negative. Fact Sheet for Patients:  PinkCheek.be Fact Sheet for Healthcare Providers:  GravelBags.it This test is not yet ap proved or cleared by the [place] FDA and  has been authorized for detection and/or diagnosis of SARS-CoV-2 by FDA under an Emergency Use Authorization (EUA). This EUA will remain  in effect (meaning this test can be used) for the duration of the COVID-19 declaration under Section 564(b)(1) of the Act, 21 U.S.C. section 360bbb-3(b)(1), unless the authorization is terminated or revoked sooner.    Influenza A by PCR NEGATIVE NEGATIVE Final   Influenza B by PCR NEGATIVE NEGATIVE Final    Comment: (NOTE) The Xpert Xpress  SARS-CoV-2/FLU/RSV assay is intended as an aid in  the diagnosis of influenza from Nasopharyngeal swab specimens and  should not be used as a sole basis for treatment. Nasal washings and  aspirates are unacceptable for Xpert Xpress SARS-CoV-2/FLU/RSV  testing. Fact Sheet for Patients: PinkCheek.be Fact Sheet for Healthcare Providers: GravelBags.it This test is not yet approved or cleared by the [place] FDA and  has been authorized for detection and/or diagnosis of SARS-CoV-2 by  FDA under an Emergency Use Authorization (EUA). This EUA will remain  in effect (meaning this test can be used) for the duration of the  Covid-19 declaration under Section 564(b)(1) of the Act, 21  U.S.C. section 360bbb-3(b)(1), unless the authorization is  terminated or revoked. Performed at [facility], [address] [place], [place] [address]          Radiology Studies: CT ANGIO HEAD W OR WO CONTRAST  Result Date: 05/12/2019 CLINICAL DATA:  Stroke code. Left-sided weakness, right-sided headache. EXAM: CT ANGIOGRAPHY HEAD AND NECK TECHNIQUE: Multidetector CT imaging of  the head and neck was performed using the standard protocol during bolus administration of intravenous contrast. Multiplanar CT image reconstructions and MIPs were obtained to evaluate the vascular anatomy. Carotid stenosis measurements (when applicable) are obtained utilizing NASCET criteria, using the distal internal carotid diameter as the denominator. CONTRAST:  40mL OMNIPAQUE IOHEXOL 350 MG/ML SOLN COMPARISON:  Noncontrast head CT performed concurrently, MRI/MRA head 12/25/2018, CT angiogram head/neck 08/08/2017 FINDINGS: CTA NECK FINDINGS Aortic arch: Standard aortic branching. Mild mixed plaque within the proximal left subclavian artery. 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 Right carotid system:  CCA and ICA patent within the neck without stenosis. Minimal soft plaque within the carotid bulb. Left carotid system: CCA and ICA patent within the neck without measurable stenosis. Mild mixed plaque within the left carotid bulb. Vertebral arteries: Right vertebral artery dominant and patent throughout the neck without significant stenosis. 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 Distal to this, the left vertebral artery is patent throughout the neck without significant stenosis. Skeleton: No acute bony abnormality. Cervical spondylosis greatest at C5-C6 and C6-C7 where there are small posterior disc osteophytes, uncovertebral and facet hypertrophy. Other neck: No neck mass or cervical lymphadenopathy. Upper chest: No consolidation within the imaged lung apices. Review of the MIP images confirms the above findings CTA HEAD FINDINGS Anterior circulation: The intracranial internal carotid arteries are patent. Calcified plaque within the right paraclinoid segment with no more than mild stenosis. Calcified plaque also present within the left paraclinoid segment with mild/moderate focal stenosis, slightly progressed from prior CT angiogram 08/08/2017. The bilateral middle and anterior cerebral arteries are patent without proximal branch occlusion or high-grade proximal arterial stenosis. No intracranial aneurysm is identified. Posterior circulation: The intracranial vertebral arteries are patent without significant stenosis, as is the basilar artery. The bilateral posterior cerebral arteries are patent without proximal branch occlusion or high-grade proximal arterial stenosis. Venous sinuses: Within limitations of contrast timing, no convincing thrombus. Anatomic variants: Posterior communicating arteries are poorly delineated and may be hypoplastic or absent bilaterally. Other: Similar to prior examinations, there is a faint blush of contrast  enhancement within the posterior left temporal lobe with associated large draining vein, findings consistent with known with AVM. Review of the MIP images confirms the above findings These results were called by telephone at the time of interpretation on 05/12/2019 at 2:11 pm to provider Dr. Jacqualine Code, who verbally acknowledged these results. IMPRESSION: CTA neck: 1. Bilateral common and internal carotid arteries patent within the neck without significant stenosis. Mild plaque within the bilateral carotid bulbs. 2. 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 As before, the left vertebral artery arises from the pseudoaneurysm with moderate origin stenosis. The vertebral arteries are otherwise patent throughout the neck without significant stenosis. CTA head: 1. No emergent intracranial large vessel occlusion. 2. 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 Mild/moderate focal stenosis of the paraclinoid segment on the left. No more than mild stenosis of the paraclinoid segment on the right. 3. 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 Electronically Signed   By: Kellie Simmering DO   On: 05/12/2019 14:12   CT ANGIO NECK W OR WO CONTRAST  Result Date: 05/12/2019 CLINICAL DATA:  Stroke code. Left-sided weakness, right-sided headache. EXAM: CT ANGIOGRAPHY HEAD AND NECK TECHNIQUE: Multidetector CT imaging of the head and neck was performed using the standard protocol during  bolus administration of intravenous contrast. Multiplanar CT image reconstructions and MIPs were obtained to evaluate the vascular anatomy. Carotid stenosis measurements (when applicable) are obtained utilizing NASCET criteria, using the distal internal carotid diameter as the denominator. CONTRAST:  52mL OMNIPAQUE IOHEXOL 350 MG/ML SOLN COMPARISON:  Noncontrast head CT performed concurrently, MRI/MRA head 12/25/2018, CT angiogram head/neck 08/08/2017 FINDINGS: CTA  NECK FINDINGS Aortic arch: Standard aortic branching. Mild mixed plaque within the proximal left subclavian artery. 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 Right carotid system: CCA and ICA patent within the neck without stenosis. Minimal soft plaque within the carotid bulb. Left carotid system: CCA and ICA patent within the neck without measurable stenosis. Mild mixed plaque within the left carotid bulb. Vertebral arteries: Right vertebral artery dominant and patent throughout the neck without significant stenosis. 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 Distal to this, the left vertebral artery is patent throughout the neck without significant stenosis. Skeleton: No acute bony abnormality. Cervical spondylosis greatest at C5-C6 and C6-C7 where there are small posterior disc osteophytes, uncovertebral and facet hypertrophy. Other neck: No neck mass or cervical lymphadenopathy. Upper chest: No consolidation within the imaged lung apices. Review of the MIP images confirms the above findings CTA HEAD FINDINGS Anterior circulation: The intracranial internal carotid arteries are patent. Calcified plaque within the right paraclinoid segment with no more than mild stenosis. Calcified plaque also present within the left paraclinoid segment with mild/moderate focal stenosis, slightly progressed from prior CT angiogram 08/08/2017. The bilateral middle and anterior cerebral arteries are patent without proximal branch occlusion or high-grade proximal arterial stenosis. No intracranial aneurysm is identified. Posterior circulation: The intracranial vertebral arteries are patent without significant stenosis, as is the basilar artery. The bilateral posterior cerebral arteries are patent without proximal branch occlusion or high-grade proximal arterial stenosis. Venous sinuses: Within  limitations of contrast timing, no convincing thrombus. Anatomic variants: Posterior communicating arteries are poorly delineated and may be hypoplastic or absent bilaterally. Other: Similar to prior examinations, there is a faint blush of contrast enhancement within the posterior left temporal lobe with associated large draining vein, findings consistent with known with AVM. Review of the MIP images confirms the above findings These results were called by telephone at the time of interpretation on 05/12/2019 at 2:11 pm to provider Dr. Jacqualine Code, who verbally acknowledged these results. IMPRESSION: CTA neck: 1. Bilateral common and internal carotid arteries patent within the neck without significant stenosis. Mild plaque within the bilateral carotid bulbs. 2. 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 As before, the left vertebral artery arises from the pseudoaneurysm with moderate origin stenosis. The vertebral arteries are otherwise patent throughout the neck without significant stenosis. CTA head: 1. No emergent intracranial large vessel occlusion. 2. 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 Mild/moderate focal stenosis of the paraclinoid segment on the left. No more than mild stenosis of the paraclinoid segment on the right. 3. 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 Electronically Signed   By: Kellie Simmering DO   On: 05/12/2019 14:12   MR BRAIN WO CONTRAST  Result Date: 05/12/2019 CLINICAL DATA:  Acute presentation with unusual headache. Left arm and leg weakness. History of pituitary tumor. EXAM: MRI HEAD WITHOUT CONTRAST TECHNIQUE: Multiplanar, multiecho pulse sequences of the brain and surrounding structures were obtained without intravenous contrast. COMPARISON:  CT studies same day.  MRI 12/25/2018. FINDINGS: Brain: Diffusion imaging shows a punctate acute infarction in the medial posterior parietal lobe on the  left. No other acute or subacute infarction. No brainstem abnormality. Old small vessel infarction in the left cerebellum. Cerebral hemispheres show an old infarction in the left posterior parietal lobe with some hemosiderin deposition. This was recent on the study August. Small old bilateral occipital infarctions. Moderate to marked chronic small-vessel ischemic changes elsewhere throughout the cerebral hemispheric white matter. Old focus of hemosiderin deposition in the right frontal lobe. Scattered other small foci of hemosiderin deposition associated with some of the old infarctions. 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 This is unchanged when compared to a study from March 2019. Vascular: Major vessels at the base of the brain show flow. Skull and upper cervical spine: Negative Sinuses/Orbits: Clear/normal Other: None IMPRESSION: Punctate acute infarction in the medial posterior left parietal lobe. Expected evolutionary changes cortical infarction in the left posterior parietal lobe which was recent on the study of August. Hemosiderin deposition in that region. Other small occipital infarctions bilaterally and in the right frontal lobe as seen previously. Extensive chronic small-vessel ischemic change throughout the brain as outlined above, similar to the previous studies. Numerous foci of hemosiderin deposition associated with some of the old insults. No change in a left lateral temporal pial dural arteriovenous malformation without evidence of hemorrhage. No change in a cystic pituitary macro adenoma measuring up to 13 mm. Electronically Signed   By: Nelson Chimes M.D.   On: 05/12/2019 20:42   CT HEAD CODE STROKE WO CONTRAST  Result Date: 05/12/2019 CLINICAL DATA:  Code stroke. Neuro deficit, acute, stroke suspected. Patient reports headache  with left-sided weakness for 30 minutes. History of brain bleed and AVM EXAM: CT HEAD WITHOUT CONTRAST TECHNIQUE: Contiguous axial images were obtained from the base of the skull through the vertex without intravenous contrast. COMPARISON:  MRI/MRA head 12/25/2018, head CT 12/25/2018 FINDINGS: Brain: No evidence of acute intracranial hemorrhage. No acute demarcated cortical infarction is identified. 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 Also redemonstrated is a small chronic lacunar infarct within the left cerebellum. Stable background generalized parenchymal atrophy and chronic small vessel ischemic disease. Unchanged small focus of hyperdensity corresponding with a known posterior left temporal lobe AVM (series 3, image 16). No evidence of intracranial mass. No midline shift or extra-axial fluid collection. 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 Vascular: Left temporal lobe AVM as described. No hyperdense vessel. Atherosclerotic calcifications. Skull: Normal. Negative for fracture or focal lesion. Sinuses/Orbits: Visualized orbits demonstrate no acute abnormality. No significant paranasal sinus disease or mastoid effusion at the imaged levels. These results were called by telephone at the time of interpretation on 05/12/2019 at 1:50 pm to provider MARK QUALE , who verbally acknowledged these results. IMPRESSION: 1. No CT evidence of acute intracranial abnormality. 2. 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 3. Stable generalized parenchymal atrophy and chronic small vessel ischemic disease. 4. Small focus of hyperdensity corresponds with a known small left temporal lobe AVM. 5. 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 Electronically Signed   By: Kellie Simmering DO   On: 05/12/2019 13:51        Scheduled Meds: .  stroke: mapping our early stages of recovery book    Does  not apply Once  . amLODipine  5 mg Oral Daily  . aspirin EC  81 mg Oral Daily  . carbidopa-levodopa  1 tablet Oral TID  . cholecalciferol  1,000 Units Oral Daily  . doxepin  10 mg Oral QHS  . DULoxetine  60 mg Oral Daily  . enoxaparin (LOVENOX) injection  40 mg Subcutaneous Q24H  . losartan  100 mg Oral Daily   And  . hydrochlorothiazide  12.5 mg Oral Daily  . insulin aspart  0-5 Units Subcutaneous QHS  . insulin aspart  0-9 Units Subcutaneous TID WC  . pantoprazole  40 mg Oral Daily  . rosuvastatin  10 mg Oral Daily  . traZODone  50 mg Oral QHS   Continuous Infusions: . sodium chloride 75 mL/hr at 05/13/19 0617     LOS: 0 days    Time spent: 33 mins    Wyvonnia Dusky, MD [facility] Pager [phone]  If 7PM-7AM, please contact night-coverage [email or web address] Password [facility] 05/13/2019, 7:23 AM

## 2019-05-13 NOTE — Plan of Care (Signed)
Pt sensation has improved during the shift. Pt still has a drift in left foot. Will continue to monitor.

## 2019-05-13 NOTE — Evaluation (Signed)
Physical Therapy Evaluation Patient Details Name: Jon [name] MRN: [id number] DOB: [birth year] Today's Date: 05/13/2019   History of Present Illness  Pt is 69 y/o M with PMH stroke, anteriovenous malformation, pituitary adenoma, T2DM, and HTN. Pt presented to [facility] with c/o worsening L sided weakness (some weakness present at baseline d/t remote stroke hx). MR w/o contrast revealed punctate acute infaction in medial posterior L parietal lobe.  Clinical Impression  Pt is a pleasant 69 year old male who was admitted for L arm weakness.  Pt performs bed mobility with supervision, transfers with min A, and ambulation with CGA/min A. Pt requiring CGA/min A during STS transfer secondary to weakness and demonstrating retropulsion with initial attempt; with second attempt pt requiring extra time to achieve fully upright position, utilizing primarily RUE to push off from bed. Pt ambulates 225' with RW (with chair follow), limited in further distance by reports of onset of dizziness; pt demos slight R lean and decreased L hip/knee flexion during swing phase; pt also demos 1 incident of near loss of balance to R. Of note, pt reporting multiple falls in the last year, with most recent 2 weeks ago, stating he often becomes dizzy and sometimes passes out. Pt demonstrates deficits with strength, coordination, balance, and sensation impairing functional mobility. Pt will benefit from continued skilled PT to address above deficits.     Follow Up Recommendations SNF    Equipment Recommendations  Rolling walker with 5" wheels(pt currently with standard RW and SPC)    Recommendations for Other Services       Precautions / Restrictions Precautions Precautions: Fall Restrictions Weight Bearing Restrictions: No      Mobility  Bed Mobility Overal bed mobility: Modified Independent Bed Mobility: Supine to Sit     Supine to sit: Supervision Sit to supine: Min guard      Transfers Overall transfer  level: Needs assistance Equipment used: Rolling walker (2 wheeled) Transfers: Sit to/from Stand Sit to Stand: Min assist         General transfer comment: With initial attempt, demos retropulsion. With second attempt, improvement though requiring extra time to assume upright secondary to weakness, using RUE to assist with pushing off from bed.  Ambulation/Gait Ambulation/Gait assistance: Min guard/min A  Gait Distance (Feet): 225 Feet Assistive device: Rolling walker (2 wheeled) Gait Pattern/deviations: Step-through pattern     General Gait Details: slightly decreased R step length, decreased L hip/knee flexion during swing phase. 1 occurrence of near loss of balance to R side, though able to self-correct with support of RW. Pt reporting onset of dizziness, resulting in discontinuation of gait after ~225'. Chair-follow utilized secondary to pt's reports of frequent falls.  Stairs            Wheelchair Mobility    Modified Rankin (Stroke Patients Only) Modified Rankin (Stroke Patients Only) Modified Rankin: Moderately severe disability     Balance Overall balance assessment: Needs assistance Sitting-balance support: Feet supported Sitting balance-Leahy Scale: Good Sitting balance - Comments: able to sit steady EOB without UE support Postural control: Posterior lean Standing balance support: Bilateral upper extremity supported Standing balance-Leahy Scale: Fair Standing balance comment: Able to perform static standing with CGA; pt requiring CGA/min A during gait with 1 incident of near loss of balance to R.                             Pertinent Vitals/Pain Pain Assessment: 0-10 Pain Score: 4  Pain Location: L arm is "achy" though improved from earlier, per pt; and headache Pain Descriptors / Indicators: Aching Pain Intervention(s): Monitored during session    [place] expects to be discharged to:: Private residence Living Arrangements:  Spouse/significant other Available Help at Discharge: Family;Available PRN/intermittently Type of Home: House Home Access: Stairs to enter Entrance Stairs-Rails: Right;Left;Can reach both Entrance Stairs-Number of Steps: Pt reports 7 or 8 steps to primary home entrance on side with bilateral railing. Wife works and is gone during the day, though son lives nearby and available intermittently. Home Layout: One level Home Equipment: Walker - standard ;Cane - single point      Prior Function Level of Independence: Independent with assistive device(s)         Comments: Pt reports use of SPC with community and household distances; earlier in the year used RW secondary to hx of stroke     Hand Dominance   Dominant Hand: Right    Extremity/Trunk Assessment   Upper Extremity Assessment Upper Extremity Assessment: Defer to OT evaluation;Overall [facility] for tasks assessed RUE Deficits / Details: MMT: shoulder 4-/5, elbow 4/5, grip 4-/5 LUE Deficits / Details: MMT: shoulder 3-/5, elbow 4-/5, grip 3/5    Lower Extremity Assessment Lower Extremity Assessment: RLE deficits/detail;LLE deficits/detail RLE Deficits / Details: Grossly 4/5, normal AROM at hip/knee/ankle RLE Sensation: WNL LLE Deficits / Details: 4-/5 knee flex/ex; normal AROM at hip/knee/ankle though with increased effort to perform heel slide supine; difficulty performing coordination task (heel tap along opposite LE) LLE Sensation: decreased light touch       Communication   Communication: No difficulties  Cognition Arousal/Alertness: Awake/alert Behavior During Therapy: WFL for tasks assessed/performed Overall Cognitive Status: Within Functional Limits for tasks assessed                                 General Comments: At times slightly delayed to respond to questions; A&O x4      General Comments      Exercises Total Joint Exercises Marching in Standing: AROM;Both;20 reps Other Exercises Other  Exercises: OT facilitates education re: role of OT, fall prevention, and d/c recommendations. Pt verbalized understanding. Pt not agreeable to rehab when OT recommends potential need for rehabilitation.   Assessment/Plan    PT Assessment Patient needs continued PT services  PT Problem List Decreased strength;Decreased mobility;Decreased safety awareness;Decreased coordination;Decreased activity tolerance;Decreased balance;Impaired sensation;Pain       PT Treatment Interventions DME instruction;Therapeutic exercise;Gait training;Balance training;Stair training;Neuromuscular re-education;Functional mobility training;Therapeutic activities;Patient/family education    PT Goals (Current goals can be found in the Care Plan section)  Acute Rehab PT Goals Patient Stated Goal: to go home PT Goal Formulation: With patient Time For Goal Achievement: 05/27/19 Potential to Achieve Goals: Good    Frequency Min 2X/week   Barriers to discharge        Co-evaluation               AM-PAC PT "6 Clicks" Mobility  Outcome Measure Help needed turning from your back to your side while in a flat bed without using bedrails?: None Help needed moving from lying on your back to sitting on the side of a flat bed without using bedrails?: None Help needed moving to and from a bed to a chair (including a wheelchair)?: A Little Help needed standing up from a chair using your arms (e.g., wheelchair or bedside chair)?: A Little Help needed to walk  in hospital room?: A Little Help needed climbing 3-5 steps with a railing? : A Lot 6 Click Score: 19    End of Session Equipment Utilized During Treatment: Gait belt(chair follow during ambulation) Activity Tolerance: Patient tolerated treatment well(limited in ambulation dist secondary to dizziness onset) Patient left: in chair;with call bell/phone within reach;with chair alarm set Nurse Communication: Mobility status PT Visit Diagnosis: Unsteadiness on feet  (R26.81);History of falling (Z91.81);Other abnormalities of gait and mobility (R26.89);Repeated falls (R29.6);Muscle weakness (generalized) (M62.81);Difficulty in walking, not elsewhere classified (R26.2)    Time: 1020-1100 PT Time Calculation (min) (ACUTE ONLY): 40 min   Charges:   PT Evaluation $PT Eval Moderate Complexity: 1 Mod PT Treatments $Gait Training: 8-22 mins       Petra Kuba, PT, DPT 05/13/19, 12:22 PM

## 2019-05-13 NOTE — Evaluation (Signed)
Occupational Therapy Evaluation Patient Details Name: Jon [name] MRN: [id number] DOB: 1950/09/23 Today's Date: 05/13/2019    History of Present Illness Pt is 69 y/o M with PMH stroke, anteriovenous malformation, pituitary adenoma, T2DM, and HTN. Pt presented to [facility] with c/o worsening L sided weakness (some weakness present at baseline d/t remote stroke hx). MR w/o contrast revealed punctate acute infaction in medial posterior L parietal lobe.   Clinical Impression   Pt seen for OT evaluation this date. Prior to hospital admission, pt was indep with self care and performed fxl mobility for household distances with [facility].  Pt lives in [facility] with spouse who works during the day and has 7-8 steps to enter.  Currently pt demonstrates impairments in standing balance and tolerance as well as general decreased strength and fxl activity tolerance with L UE weakness significantly increased, requiring MIN A for sit<>stand, MOD A for LB ADLs, and MIN/MOD A with dynamic fxl standing tasks such as clothing mgt over hips.  Pt would benefit from skilled OT to address noted impairments and functional limitations (see below for any additional details) in order to maximize safety and independence while minimizing falls risk and caregiver burden.  Upon hospital discharge, recommend pt discharge to SNF. Will continue to assess for most appropriate d/c disposition.     Follow Up Recommendations  SNF    Equipment Recommendations  3 in 1 bedside commode;Tub/shower seat    Recommendations for Other Services       Precautions / Restrictions Precautions Precautions: Fall Restrictions Weight Bearing Restrictions: No      Mobility Bed Mobility Overal bed mobility: Needs Assistance Bed Mobility: Supine to Sit;Sit to Supine     Supine to sit: Min assist Sit to supine: Min guard      Transfers Overall transfer level: Needs assistance Equipment used: Rolling walker (2 wheeled) Transfers: Sit to/from  Stand Sit to Stand: Min assist              Balance Overall balance assessment: Needs assistance Sitting-balance support: Feet supported Sitting balance-Leahy Scale: Good   Postural control: Posterior lean Standing balance support: Bilateral upper extremity supported Standing balance-Leahy Scale: Fair Standing balance comment: Pt requires MIN A throughout for static standing and with steps. Pt demos some retropulsion during standing, self corrects on 2 o4 of noticable incidences, requires OT assist to correct on 2 of 4 incidences. Pt with weight in heels in standing.                           ADL either performed or assessed with clinical judgement   ADL Overall ADL's : Needs assistance/impaired Eating/Feeding: Set up;Sitting   Grooming: Wash/dry hands;Wash/dry face;Oral care;Set up;Sitting           Upper Body Dressing : Minimal assistance;Sitting   Lower Body Dressing: Moderate assistance;Sit to/from stand   Toilet Transfer: Minimal assistance;Ambulation;RW;BSC   Toileting- Clothing Manipulation and Hygiene: Minimal assistance;Moderate assistance;Sit to/from stand       Functional mobility during ADLs: Minimal assistance;Rolling walker(Pt takes 4-5 side steps to his R at bed side in prep for stand to sit prior to getting back to bed to optimize positioning.)       Vision Patient Visual Report: No change from baseline Additional Comments: pt describes potential 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     Perception     Praxis      Pertinent Vitals/Pain Pain Assessment: No/denies pain  Hand Dominance Right   Extremity/Trunk Assessment Upper Extremity Assessment Upper Extremity Assessment: RUE deficits/detail;LUE deficits/detail RUE Deficits / Details: MMT: shoulder 4-/5, elbow 4/5, grip 4-/5 LUE Deficits / Details: MMT: shoulder 3-/5, elbow 4-/5, grip 3/5   Lower Extremity Assessment Lower Extremity Assessment: Defer to PT  evaluation;Generalized weakness       Communication Communication Communication: No difficulties   Cognition Arousal/Alertness: Awake/alert Behavior During Therapy: WFL for tasks assessed/performed Overall Cognitive Status: Within Functional Limits for tasks assessed                                 General Comments: two instances of delayed word finding. appropraite with all cues. A&O x4   General Comments       Exercises Other Exercises Other Exercises: OT facilitates education re: role of OT, fall prevention, and d/c recommendations. Pt verbalized understanding. Pt not agreeable to rehab when OT recommends potential need for rehabilitation.   Shoulder Instructions      Home Living Family/patient expects to be discharged to:: Private residence Living Arrangements: Spouse/significant other Available Help at Discharge: Family;Available PRN/intermittently(wife works during the day at [employer].) Type of Home: House Home Access: Stairs to enter [employer] of Steps: pt reports 7 or 8 steps to primary home entrance on side with bilateral railing. 6 STE front. Entrance Stairs-Rails: Right;Left;Can reach both Home Layout: One level     Bathroom Shower/Tub: Tub/shower unit;Curtain   Bathroom Toilet: Standard Bathroom Accessibility: Yes How Accessible: Accessible via walker Home Equipment: [place] - 2 wheels;Cane - single point          Prior Functioning/Environment Level of Independence: Independent with assistive device(s)        Comments: Pt reports using SPC for both household and further distances, states he cannot tolerate walking very far. Endorses several falls at home. States he was still able to mow, but states he cannot tolerate many other "heavy lifting" activities. States wife primarily does cooking, Education administrator, Medical sales representative. States he stands in shower, no grab bars. States he has difficulty with socks/shoes on L side-modifies by primarily  wearing slip on shoes.        OT Problem List: Decreased strength;Decreased range of motion;Decreased activity tolerance;Impaired balance (sitting and/or standing);Decreased safety awareness;Decreased knowledge of use of DME or AE      OT Treatment/Interventions: Self-care/ADL training;Therapeutic exercise;Energy conservation;DME and/or AE instruction;Therapeutic activities;Patient/family education;Balance training    OT Goals(Current goals can be found in the care plan section) Acute Rehab OT Goals Patient Stated Goal: to go home OT Goal Formulation: With patient Time For Goal Achievement: 05/27/19 Potential to Achieve Goals: Good  OT Frequency: Min 1X/week   Barriers to D/C: Decreased caregiver support(wife works during the day.)          Co-evaluation              AM-PAC OT "6 Clicks" Daily Activity     Outcome Measure Help from another person eating meals?: None Help from another person taking care of personal grooming?: A Little Help from another person toileting, which includes using toliet, bedpan, or urinal?: A Lot Help from another person bathing (including washing, rinsing, drying)?: A Lot Help from another person to put on and taking off regular upper body clothing?: A Little Help from another person to put on and taking off regular lower body clothing?: A Lot 6 Click Score: 16   End of Session Equipment Utilized During Treatment:  Gait belt  Activity Tolerance: Patient tolerated treatment well Patient left: in bed;with call bell/phone within reach;with bed alarm set  OT Visit Diagnosis: Unsteadiness on feet (R26.81);Repeated falls (R29.6)                Time: [id number] OT Time Calculation (min): 23 min Charges:  OT General Charges $OT Visit: 1 Visit OT Evaluation $OT Eval Moderate Complexity: 1 Mod OT Treatments $Self Care/Home Management : 8-22 mins  Gerrianne Scale, MS, OTR/L ascom [phone] 05/13/19, 11:36 AM

## 2019-05-14 LAB — CBC
HCT: 40.5 % (ref 39.0–52.0)
Hemoglobin: 13.4 g/dL (ref 13.0–17.0)
MCH: 30.7 pg (ref 26.0–34.0)
MCHC: 33.1 g/dL (ref 30.0–36.0)
MCV: 92.7 fL (ref 80.0–100.0)
Platelets: 153 10*3/uL (ref 150–400)
RBC: 4.37 MIL/uL (ref 4.22–5.81)
RDW: 12.5 % (ref 11.5–15.5)
WBC: 3.5 10*3/uL — ABNORMAL LOW (ref 4.0–10.5)
nRBC: 0 % (ref 0.0–0.2)

## 2019-05-14 LAB — BASIC METABOLIC PANEL
Anion gap: 6 (ref 5–15)
BUN: 13 mg/dL (ref 8–23)
CO2: 26 mmol/L (ref 22–32)
Calcium: 8.2 mg/dL — ABNORMAL LOW (ref 8.9–10.3)
Chloride: 104 mmol/L (ref 98–111)
Creatinine, Ser: 0.92 mg/dL (ref 0.61–1.24)
GFR calc Af Amer: >60 mL/min (ref >60)
GFR calc non Af Amer: >60 mL/min (ref >60)
Glucose, Bld: 171 mg/dL — ABNORMAL HIGH (ref 70–99)
Potassium: 3.5 mmol/L (ref 3.5–5.1)
Sodium: 136 mmol/L (ref 135–145)

## 2019-05-14 LAB — GLUCOSE, CAPILLARY
Glucose-Capillary: 101 mg/dL — ABNORMAL HIGH (ref 70–99)
Glucose-Capillary: 113 mg/dL — ABNORMAL HIGH (ref 70–99)

## 2019-05-14 MED ORDER — ASPIRIN 81 MG PO TBEC: 81.0000 mg | DELAYED_RELEASE_TABLET | Freq: Every day | ORAL | 0 refills | Status: AC

## 2019-05-14 NOTE — Progress Notes (Signed)
Occupational Therapy Treatment Patient Details Name: Jon [name] MRN: [id number] DOB: November 20, 1950 Today's Date: 05/14/2019    History of present illness Pt is 69 y/o M with PMH stroke, anteriovenous malformation, pituitary adenoma, T2DM, and HTN. Pt presented to [facility] with c/o worsening L sided weakness (some weakness present at baseline d/t remote stroke hx). MR w/o contrast revealed punctate acute infaction in medial posterior L parietal lobe.   OT comments  Pt seen for OT tx this date. Pt agreeable, endorses continued L sided symptoms and also endorses 5/10 L sided headache near end of session. Pt instructed in falls prevention and minimizing lightheadedness 2/2 positional changes, LUE FMC/ex activities to perform, importance of incorporating L side more into daily activities as therapeutic opportunities; pt verbalizes understanding but requires cues during session to incorporate. CGA to Min A for functional ADL transfers using RW and posterior near LOB during session. Pt attempted to stand at the sink without UE support on RW/counter to prepare toothbrush for grooming task when he experienced a 2nd near LOB posteriorly, requiring Min-Mod A to correct. Pt continues to benefit from skilled OT services to maximize return to PLOF.    Follow Up Recommendations  SNF    Equipment Recommendations  3 in 1 bedside commode;Tub/shower seat    Recommendations for Other Services      Precautions / Restrictions Precautions Precautions: Fall Restrictions Weight Bearing Restrictions: No       Mobility Bed Mobility Overal bed mobility: Modified Independent Bed Mobility: Supine to Sit;Sit to Supine     Supine to sit: Supervision Sit to supine: Supervision   General bed mobility comments: additional time/effort to perform  Transfers Overall transfer level: Needs assistance Equipment used: Rolling walker (2 wheeled) Transfers: Sit to/from Stand Sit to Stand: Min guard;Min assist         General transfer comment: initially very unsteady with posterior LOB requiring Min-Mod A to correct and standing back on heels (pt endorses doing this often); pt instructed in weight shifting anteriorly to bring toes onto the floor to improve balance    Balance Overall balance assessment: Needs assistance Sitting-balance support: Feet supported Sitting balance-Leahy Scale: Good   Postural control: Posterior lean Standing balance support: Bilateral upper extremity supported Standing balance-Leahy Scale: Fair Standing balance comment: fair- with initial near LOB posteriorly upon initial stand and when standing at sink with attempts to decrease BUE/UE support on RW                           ADL either performed or assessed with clinical judgement   ADL Overall ADL's : Needs assistance/impaired     Grooming: Wash/dry hands;Wash/dry face;Oral care;Set up;Sitting Grooming Details (indicate cue type and reason): Initially attempted standing at sink to perform with pt unsteady with slight LOB posteriorly, requiring Min A to correct; pt then endorsed feeling lightheaded. Once back in sitting, pt reported lightheadedness improving somewhat and able to perform grooming tasks with set up and encouragement to incorporate LUE.                                     Vision Patient Visual Report: No change from baseline     Perception     Praxis      Cognition Arousal/Alertness: Awake/alert Behavior During Therapy: WFL for tasks assessed/performed Overall Cognitive Status: Within Functional Limits for tasks assessed  General Comments: At times slightly delayed to respond to questions; A&O x4        Exercises Other Exercises Other Exercises: Pt instructed in falls prevention and minimizing lightheadedness 2/2 positional changes, LUE FMC/ex activities to perform, importance of incorporating L side more into daily activities  as therapeutic opportunities; pt verbalizes understanding but requires cues during session to incorporate   Shoulder Instructions       General Comments      Pertinent Vitals/ Pain       Pain Assessment: 0-10 Pain Score: 5  Pain Location: L sided headache Pain Descriptors / Indicators: Aching Pain Intervention(s): Limited activity within patient's tolerance;Monitored during session;Repositioned  Home Living                                          Prior Functioning/Environment              Frequency  Min 1X/week        Progress Toward Goals  OT Goals(current goals can now be found in the care plan section)  Progress towards OT goals: Progressing toward goals  Acute Rehab OT Goals Patient Stated Goal: to go home OT Goal Formulation: With patient Time For Goal Achievement: 05/27/19 Potential to Achieve Goals: Good  Plan Discharge plan remains appropriate;Frequency remains appropriate    Co-evaluation                 AM-PAC OT "6 Clicks" Daily Activity     Outcome Measure   Help from another person eating meals?: None Help from another person taking care of personal grooming?: A Little Help from another person toileting, which includes using toliet, bedpan, or urinal?: A Lot Help from another person bathing (including washing, rinsing, drying)?: A Lot Help from another person to put on and taking off regular upper body clothing?: A Little Help from another person to put on and taking off regular lower body clothing?: A Lot 6 Click Score: 16    End of Session Equipment Utilized During Treatment: Gait belt;Rolling walker  OT Visit Diagnosis: Unsteadiness on feet (R26.81);Repeated falls (R29.6)   Activity Tolerance Patient tolerated treatment well   Patient Left in bed;with call bell/phone within reach;with bed alarm set;with nursing/sitter in room   Nurse Communication          Time: [id number] OT Time Calculation (min): 33  min  Charges: OT General Charges $OT Visit: 1 Visit OT Treatments $Self Care/Home Management : 8-22 mins $Therapeutic Activity: 8-22 mins  Jeni Salles, MPH, MS, OTR/L ascom [phone] 05/14/19, 10:19 AM

## 2019-05-14 NOTE — Progress Notes (Addendum)
SLP Cancellation Note  Patient Details Name: Jon [name] MRN: [id number] DOB: Nov 14, 1950   Cancelled treatment:       Reason Eval/Treat Not Completed: SLP screened, no needs identified, will sign off(chart reviewed; consulted NSG then met pt/Wife in room). Pt denied any difficulty swallowing and is currently on a regular diet; tolerates swallowing pills w/ water per NSG. Pt conversed at conversational level w/ SLP and Wife w/out overt deficits noted; pt and Wife denied any speech-language deficits.  No further skilled ST services indicated as pt appears at his baseline. Pt and Wife agreed to f/u w/ PCP if any changes noted post d/c. NSG to reconsult if any change in status while admitted.    Jon Kenner, MS, CCC-SLP Jon [name] 05/14/2019, 10:39 AM

## 2019-05-14 NOTE — TOC Initial Note (Signed)
Transition of Care [facility]) - Initial/Assessment Note    Patient Details  Name: Jon [name] MRN: [id number] Date of Birth: [birth year]  Transition of Care [facility] CM/SW Contact:    Shelbie Ammons, RN Phone Number: 05/14/2019, 3:50 PM  Clinical Narrative:               RNCM assessment completed at patient's bedside. Patient is lying in bed eating on arrival with NAD noted. Introductions were made and informed patient that CM was there to assist with discharge planning and any needs after discharge. Discussed PT recommendation for SNF however patient reports he does not want to go anywhere and would prefer to get rehab at home patient further reports that his wife would know who he had before. Called wife from cell phone while at bedside and did discuss all these things for which she was in agreement. Wife reports that they have used [facility] in the past and would like to use them again.  Called Brad and arranged 4 wheel walker. Placed call to Bradley with [facility] and she said they could accept patient back.       Expected Discharge Plan: [place] Barriers to Discharge: Barriers Resolved   Patient Goals and CMS Choice Patient states their goals for this hospitalization and ongoing recovery are:: to get home and get my recovery started      Expected Discharge Plan and Services Expected Discharge Plan: [place]   Discharge Planning Services: CM Consult Post Acute Care Choice: [place] arrangements for the past 2 months: Single Family Home Expected Discharge Date: 05/14/19               DME Arranged: Gilford Rile rolling DME Agency: [employer] Date DME Agency Contacted: 05/14/19 Time DME Agency Contacted: 0930 Representative spoke with at DME Agency: [place] Arranged: RN, PT, OT [place] Agency: [facility] Date [place]: 05/14/19 Time HH Agency Contacted: 0930 Representative spoke with at [place]: [place]  Prior Living  Arrangements/Services Living arrangements for the past 2 months: [place] with:: Spouse Patient language and need for interpreter reviewed:: Yes Do you feel safe going back to the place where you live?: Yes      Need for Family Participation in Patient Care: Yes (Comment) Care giver support system in place?: Yes (comment)   Criminal Activity/Legal Involvement Pertinent to Current Situation/Hospitalization: No - Comment as needed  Activities of Daily Living Home Assistive Devices/Equipment: Cane (specify quad or straight) ADL Screening (condition at time of admission) Patient's cognitive ability adequate to safely complete daily activities?: Yes Is the patient deaf or have difficulty hearing?: No Does the patient have difficulty seeing, even when wearing glasses/contacts?: No Does the patient have difficulty concentrating, remembering, or making decisions?: No Patient able to express need for assistance with ADLs?: Yes Does the patient have difficulty dressing or bathing?: Yes Independently performs ADLs?: Yes (appropriate for developmental age) Does the patient have difficulty walking or climbing stairs?: Yes Weakness of Legs: Left Weakness of Arms/Hands: Left  Permission Sought/Granted                  Emotional Assessment Appearance:: Appears older than stated age Attitude/Demeanor/Rapport: Engaged Affect (typically observed): Appropriate Orientation: : Oriented to Self, Oriented to Place, Oriented to  Time, Oriented to Situation Alcohol / Substance Use: Never Used Psych Involvement: No (comment)  Admission diagnosis:  Left arm weakness [R29.898] Left-sided weakness [R53.1] CVA (cerebral vascular accident) ([place]) [  I63.9] Patient Active Problem List   Diagnosis Date Noted  . CVA (cerebral vascular accident) ([place]) 05/13/2019  . Migraine 05/12/2019  . Depression 05/12/2019  . Hypertension   . Diabetes mellitus without complication ([place])   . Left arm  weakness   . Hypogonadism in male 03/13/2019  . Pituitary adenoma ([place]) 03/13/2019  . History of stroke 11/29/2018  . Hx of adenomatous colonic polyps 11/29/2018  . Other nontraumatic intracerebral hemorrhage ([place]) 09/06/2018  . Occipital stroke ([place]) 08/19/2018  . 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 Left-sided weakness 06/02/2018  . HTN (hypertension) 06/02/2018  . Diabetes ([place]) 06/02/2018  . GERD (gastroesophageal reflux disease) 06/02/2018  . Migraine without aura and without status migrainosus, not intractable 01/04/2018  . 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 Headache 07/30/2017  . 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 Chronic pain of left knee 05/05/2017  . 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 TIA (transient ischemic attack) 05/01/2017  . Pseudophakia of left eye 03/14/2017  . OSA (obstructive sleep apnea) 11/02/2016  . 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 Hyperlipidemia, mixed 04/29/2015  . 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 [place] 01/04/2013  . Gross hematuria 04/11/2012  . Nephrolithiasis 04/11/2012  . Heartburn 11/04/2010   PCP:  Idelle Crouch, MD Pharmacy:   [facility] [address], [place] - [place] [address] [place] [address] Phone: [phone] Fax: [phone]     Social Determinants of Health (SDOH) Interventions    Readmission Risk Interventions No flowsheet data found.

## 2019-05-14 NOTE — Discharge Summary (Signed)
Physician Discharge Summary  JAYA FRANA [id number] DOB: September 05, 1950 DOA: 05/12/2019  PCP: Idelle Crouch, MD  Admit date: 05/12/2019 Discharge date: 05/14/2019  Admitted From: home Disposition:  Home w/ home health   Recommendations for Outpatient Follow-up:  1. Follow up with PCP in 1-2 weeks 2. F/u w/ neuro in 1 week, will also need outpatient EEG  Home Health: yes Equipment/Devices: walker  Discharge Condition:stable CODE STATUS: full Diet recommendation: Heart Healthy/ carb modified   Brief/Interim Summary: HPI taken from Dr. Blaine Hamper:  Rolfe Bensinger Umana is a 69 y.o. male with medical history significant of hypertension, hyperlipidemia, diabetes mellitus, TIA, stroke with left-sided weakness, GERD, depression, pituitary tumor, migraine headaches, pituitary tumor (1cm L pit mass as of 06/03/2018 MRI), left AVM and prior cerebral hemorrhage,  Patient states that he has history of stroke with left sided weakness, but his left arm weakness has worsened today at about 12:30.  His left leg weakness has not changed significantly.  No facial droop or slurred speech.  Patient does not have chest pain, shortness of breath, cough.  No nausea vomiting, diarrhea, abdominal, symptoms of UTI. He also reports left-sided headache which he described as "like cotton".  ED Course: pt was found to have WBC 5.8, INR 0.9, PTT 32, negative RVP for COVID-19, electrolytes renal function okay, oxygen saturation 98% on room air, blood pressure 172/97, tachycardia. Head CT with no acute changes. CTA shows no evidence of LVO. Pt is paced on MedSurg bed for observation. Neurology, Dr. Doy Mince was consulted.  Hospital Course from Dr. Lenise Herald 1/3/-05/14/19: Pt was found to have a CVA in the medial posterior left parietal lobe as per MRI brain. Carotid US did not show any significant stenosis & echo shows EF 123456, grade I diastolic dysfunction but no PFO, ASD, or VSD. Neuro recommended pt continue on aspirin  only as pt has hx of hemorrhage. Pt will have an EEG done as an outpatient as per neuro. PT/OT recommended SNF but pt and pt's wife declined but agreed to home health. Home health was set up prior to d/c by CM.   Discharge Diagnoses:  Principal Problem:   Left arm weakness Active Problems:   HTN (hypertension)   GERD (gastroesophageal reflux disease)   History of stroke   Hyperlipidemia, mixed   Hypertension   Diabetes mellitus without complication (HCC)   Depression   CVA (cerebral vascular accident) ([place])  CVA: punctate acute infarction in the medial posterior left parietal lobe as per MRI brain. Hx of CVA w/ left sided weakness w/ left arm weakness that has worsened. Neuro following & recs. Continue neuro checks. Continue aspirin. Echo shows EF 123456, grade I diastolic dysfunction, no ASD, VSD, or PFO.  EEG ordered but can be done as an outpatient as per neuro. PT/OT recommending SNF but pt and pt's wife are refusing SNF but agree to home health. Home health orders placed  Essential hypertension: continue on amlodipine, losartan, & HCTZ  GERD: continue on PPI  Hyperlipidemia mixed: continue on statin   DM2: Last A1c 7.5 on 08/20/18. Hold home dose of actos and amaryl. Continue on SSI w/ accuchecks   Depression: severity unknown. Continue on home dose of duloxetine    Discharge Instructions  Discharge Instructions    Diet - low sodium heart healthy   Complete by: As directed    Diet Carb Modified   Complete by: As directed    Discharge instructions   Complete by: As directed    F/u  PCP in 1-2 weeks; F/u neuro in 1 week & will need EEG as an outpatient   Increase activity slowly   Complete by: As directed      Allergies as of 05/14/2019      Reactions   Beta Adrenergic Blockers Itching   Topiramate Nausea And Vomiting   Other reaction(s): Dizziness, Hallucination Other Reaction: violence   Aspirin Other (See Comments)   Per pt's PCP due to spontaneous bleeding in  his brain.   Benadryl [diphenhydramine] Hives   Lisinopril Itching   Other reaction(s): Other (See Comments) Leg pain   Nabumetone Other (See Comments)   Reaction: unknown   Valproic Acid Other (See Comments)   Reaction: Headache      Medication List    TAKE these medications   acetaminophen 500 MG tablet Commonly known as: TYLENOL Take 1,500 mg by mouth every 6 (six) hours as needed for moderate pain.   amLODipine 5 MG tablet Commonly known as: NORVASC Take 5 mg by mouth daily.   aspirin 81 MG EC tablet Take 1 tablet (81 mg total) by mouth daily.   butalbital-acetaminophen-caffeine 50-325-40-30 MG capsule Commonly known as: FIORICET WITH CODEINE Take 1 capsule by mouth every 4 (four) hours as needed for headache.   carbidopa-levodopa 25-100 MG tablet Commonly known as: SINEMET IR Take 1 tablet by mouth 3 (three) times daily.   D3-1000 25 MCG (1000 UT) tablet Generic drug: Cholecalciferol Take 1,000 Units by mouth daily.   doxepin 10 MG capsule Commonly known as: SINEQUAN Take 10 mg by mouth at bedtime.   DULoxetine 60 MG capsule Commonly known as: CYMBALTA Take 60 mg by mouth daily.   glimepiride 4 MG tablet Commonly known as: AMARYL Take 4 mg by mouth 2 (two) times daily.   hydrOXYzine 10 MG tablet Commonly known as: ATARAX/VISTARIL Take 10 mg by mouth 3 (three) times daily as needed for itching.   ibuprofen 200 MG tablet Commonly known as: ADVIL Take 400-800 mg by mouth every 6 (six) hours as needed for headache or mild pain.   losartan-hydrochlorothiazide 100-12.5 MG tablet Commonly known as: HYZAAR Take 1 tablet by mouth daily.   pantoprazole 40 MG tablet Commonly known as: PROTONIX Take 40 mg by mouth daily.   pioglitazone 30 MG tablet Commonly known as: ACTOS Take 30 mg by mouth daily.   rosuvastatin 10 MG tablet Commonly known as: CRESTOR Take 10 mg by mouth daily.   traZODone 50 MG tablet Commonly known as: DESYREL Take 50 mg by  mouth at bedtime.       Allergies  Allergen Reactions  . Beta Adrenergic Blockers Itching  . Topiramate Nausea And Vomiting    Other reaction(s): Dizziness, Hallucination Other Reaction: violence  . Aspirin Other (See Comments)    Per pt's PCP due to spontaneous bleeding in his brain.  . Benadryl [Diphenhydramine] Hives  . Lisinopril Itching    Other reaction(s): Other (See Comments) Leg pain  . Nabumetone Other (See Comments)    Reaction: unknown  . Valproic Acid Other (See Comments)    Reaction: Headache    Consultations:  neuro   Procedures/Studies: CT ANGIO HEAD W OR WO CONTRAST  Result Date: 05/12/2019 CLINICAL DATA:  Stroke code. Left-sided weakness, right-sided headache. EXAM: CT ANGIOGRAPHY HEAD AND NECK TECHNIQUE: Multidetector CT imaging of the head and neck was performed using the standard protocol during bolus administration of intravenous contrast. Multiplanar CT image reconstructions and MIPs were obtained to evaluate the vascular anatomy. Carotid stenosis measurements (  when applicable) are obtained utilizing NASCET criteria, using the distal internal carotid diameter as the denominator. CONTRAST:  24mL OMNIPAQUE IOHEXOL 350 MG/ML SOLN COMPARISON:  Noncontrast head CT performed concurrently, MRI/MRA head 12/25/2018, CT angiogram head/neck 08/08/2017 FINDINGS: CTA NECK FINDINGS Aortic arch: Standard aortic branching. Mild mixed plaque within the proximal left subclavian artery. 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 Right carotid system: CCA and ICA patent within the neck without stenosis. Minimal soft plaque within the carotid bulb. Left carotid system: CCA and ICA patent within the neck without measurable stenosis. Mild mixed plaque within the left carotid bulb. Vertebral arteries: Right vertebral artery dominant and patent throughout the neck without significant stenosis. 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 Distal to this, the left vertebral artery is patent throughout the neck without significant stenosis. Skeleton: No acute bony abnormality. Cervical spondylosis greatest at C5-C6 and C6-C7 where there are small posterior disc osteophytes, uncovertebral and facet hypertrophy. Other neck: No neck mass or cervical lymphadenopathy. Upper chest: No consolidation within the imaged lung apices. Review of the MIP images confirms the above findings CTA HEAD FINDINGS Anterior circulation: The intracranial internal carotid arteries are patent. Calcified plaque within the right paraclinoid segment with no more than mild stenosis. Calcified plaque also present within the left paraclinoid segment with mild/moderate focal stenosis, slightly progressed from prior CT angiogram 08/08/2017. The bilateral middle and anterior cerebral arteries are patent without proximal branch occlusion or high-grade proximal arterial stenosis. No intracranial aneurysm is identified. Posterior circulation: The intracranial vertebral arteries are patent without significant stenosis, as is the basilar artery. The bilateral posterior cerebral arteries are patent without proximal branch occlusion or high-grade proximal arterial stenosis. Venous sinuses: Within limitations of contrast timing, no convincing thrombus. Anatomic variants: Posterior communicating arteries are poorly delineated and may be hypoplastic or absent bilaterally. Other: Similar to prior examinations, there is a faint blush of contrast enhancement within the posterior left temporal lobe with associated large draining vein, findings consistent with known with AVM. Review of the MIP images confirms the above findings These results were called by telephone at the time of interpretation on 05/12/2019 at 2:11 pm to provider Dr. Jacqualine Code, who verbally acknowledged these results. IMPRESSION: CTA neck: 1. Bilateral  common and internal carotid arteries patent within the neck without significant stenosis. Mild plaque within the bilateral carotid bulbs. 2. 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 As before, the left vertebral artery arises from the pseudoaneurysm with moderate origin stenosis. The vertebral arteries are otherwise patent throughout the neck without significant stenosis. CTA head: 1. No emergent intracranial large vessel occlusion. 2. 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 Mild/moderate focal stenosis of the paraclinoid segment on the left. No more than mild stenosis of the paraclinoid segment on the right. 3. 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 Electronically Signed   By: Kellie Simmering DO   On: 05/12/2019 14:12   CT ANGIO NECK W OR WO CONTRAST  Result Date: 05/12/2019 CLINICAL DATA:  Stroke code. Left-sided weakness, right-sided headache. EXAM: CT ANGIOGRAPHY HEAD AND NECK TECHNIQUE: Multidetector CT imaging of the head and neck was performed using the standard protocol during bolus administration of intravenous contrast. Multiplanar CT image reconstructions and MIPs were obtained to evaluate the vascular anatomy. Carotid stenosis measurements (when applicable) are obtained utilizing NASCET criteria, using the distal internal  carotid diameter as the denominator. CONTRAST:  87mL OMNIPAQUE IOHEXOL 350 MG/ML SOLN COMPARISON:  Noncontrast head CT performed concurrently, MRI/MRA head 12/25/2018, CT angiogram head/neck 08/08/2017 FINDINGS: CTA NECK FINDINGS Aortic arch: Standard aortic branching. Mild mixed plaque within the proximal left subclavian artery. 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 Right carotid system: CCA and ICA patent within the neck without stenosis. Minimal soft plaque within the carotid bulb. Left carotid system: CCA and  ICA patent within the neck without measurable stenosis. Mild mixed plaque within the left carotid bulb. Vertebral arteries: Right vertebral artery dominant and patent throughout the neck without significant stenosis. 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 Distal to this, the left vertebral artery is patent throughout the neck without significant stenosis. Skeleton: No acute bony abnormality. Cervical spondylosis greatest at C5-C6 and C6-C7 where there are small posterior disc osteophytes, uncovertebral and facet hypertrophy. Other neck: No neck mass or cervical lymphadenopathy. Upper chest: No consolidation within the imaged lung apices. Review of the MIP images confirms the above findings CTA HEAD FINDINGS Anterior circulation: The intracranial internal carotid arteries are patent. Calcified plaque within the right paraclinoid segment with no more than mild stenosis. Calcified plaque also present within the left paraclinoid segment with mild/moderate focal stenosis, slightly progressed from prior CT angiogram 08/08/2017. The bilateral middle and anterior cerebral arteries are patent without proximal branch occlusion or high-grade proximal arterial stenosis. No intracranial aneurysm is identified. Posterior circulation: The intracranial vertebral arteries are patent without significant stenosis, as is the basilar artery. The bilateral posterior cerebral arteries are patent without proximal branch occlusion or high-grade proximal arterial stenosis. Venous sinuses: Within limitations of contrast timing, no convincing thrombus. Anatomic variants: Posterior communicating arteries are poorly delineated and may be hypoplastic or absent bilaterally. Other: Similar to prior examinations, there is a faint blush of contrast enhancement within the posterior left temporal lobe with associated large draining vein, findings consistent with known with AVM.  Review of the MIP images confirms the above findings These results were called by telephone at the time of interpretation on 05/12/2019 at 2:11 pm to provider Dr. Jacqualine Code, who verbally acknowledged these results. IMPRESSION: CTA neck: 1. Bilateral common and internal carotid arteries patent within the neck without significant stenosis. Mild plaque within the bilateral carotid bulbs. 2. 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 As before, the left vertebral artery arises from the pseudoaneurysm with moderate origin stenosis. The vertebral arteries are otherwise patent throughout the neck without significant stenosis. CTA head: 1. No emergent intracranial large vessel occlusion. 2. 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 Mild/moderate focal stenosis of the paraclinoid segment on the left. No more than mild stenosis of the paraclinoid segment on the right. 3. 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 Electronically Signed   By: Kellie Simmering DO   On: 05/12/2019 14:12   MR BRAIN WO CONTRAST  Result Date: 05/12/2019 CLINICAL DATA:  Acute presentation with unusual headache. Left arm and leg weakness. History of pituitary tumor. EXAM: MRI HEAD WITHOUT CONTRAST TECHNIQUE: Multiplanar, multiecho pulse sequences of the brain and surrounding structures were obtained without intravenous contrast. COMPARISON:  CT studies same day.  MRI 12/25/2018. FINDINGS: Brain: Diffusion imaging shows a punctate acute infarction in the medial posterior parietal lobe on the left. No other acute or subacute infarction. No brainstem abnormality. Old small vessel  infarction in the left cerebellum. Cerebral hemispheres show an old infarction in the left posterior parietal lobe with some hemosiderin deposition. This was recent on the study August. Small old bilateral occipital infarctions. Moderate to marked chronic small-vessel ischemic changes  elsewhere throughout the cerebral hemispheric white matter. Old focus of hemosiderin deposition in the right frontal lobe. Scattered other small foci of hemosiderin deposition associated with some of the old infarctions. 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 This is unchanged when compared to a study from March 2019. Vascular: Major vessels at the base of the brain show flow. Skull and upper cervical spine: Negative Sinuses/Orbits: Clear/normal Other: None IMPRESSION: Punctate acute infarction in the medial posterior left parietal lobe. Expected evolutionary changes cortical infarction in the left posterior parietal lobe which was recent on the study of August. Hemosiderin deposition in that region. Other small occipital infarctions bilaterally and in the right frontal lobe as seen previously. Extensive chronic small-vessel ischemic change throughout the brain as outlined above, similar to the previous studies. Numerous foci of hemosiderin deposition associated with some of the old insults. No change in a left lateral temporal pial dural arteriovenous malformation without evidence of hemorrhage. No change in a cystic pituitary macro adenoma measuring up to 13 mm. Electronically Signed   By: Nelson Chimes M.D.   On: 05/12/2019 20:42   ECHOCARDIOGRAM COMPLETE  Result Date: 05/13/2019   ECHOCARDIOGRAM REPORT   Patient Name:   TYSUN ENGEBRETSEN Date of Exam: 05/13/2019 Medical Rec #:  [id number]       Height:       68.0 in Accession #:    [id number]      Weight:       182.8 lb Date of Birth:  06-26-1950      BSA:          1.97 m Patient Age:    2 years        BP:           125/88 mmHg Patient Gender: M               HR:           84 bpm. Exam Location:  [facility] Procedure: 2D Echo Indications:     Stroke 434.91/ I163.9  History:         Patient has prior  history of Echocardiogram examinations, most                  recent 08/20/2018.  Sonographer:     Arville Go RDCS Referring Phys:  [place] Diagnosing Phys: Ida Rogue MD IMPRESSIONS  1. Left ventricular ejection fraction, by visual estimation, is 60 to 65%. The left ventricle has normal function. There is no left ventricular hypertrophy.  2. Left ventricular diastolic parameters are consistent with Grade I diastolic dysfunction (impaired relaxation).  3. The left ventricle has no regional wall motion abnormalities.  4. Global right ventricle has normal systolic function.The right ventricular size is normal. No increase in right ventricular wall thickness.  5. Left atrial size was normal  6. TR signal is inadequate for assessing pulmonary artery systolic pressure. FINDINGS  Left Ventricle: Left ventricular ejection fraction, by visual estimation, is 60 to 65%. The left ventricle has normal function. The left ventricle has no regional wall motion abnormalities. There is no left ventricular hypertrophy. Left ventricular  diastolic parameters are consistent with Grade I diastolic dysfunction (impaired relaxation). Normal left atrial pressure. Right Ventricle: The right ventricular size is normal. No increase in right ventricular wall thickness. Global RV systolic function is has normal systolic function. Left Atrium: Left atrial size was normal in size. Right Atrium: Right atrial size was normal in size Pericardium: There is no evidence of pericardial effusion. Mitral Valve: The mitral valve is normal in structure. No evidence of mitral valve regurgitation. No evidence of mitral valve stenosis by observation. Tricuspid Valve: The tricuspid valve is normal in structure. Tricuspid valve regurgitation is not demonstrated. Aortic Valve: The aortic valve is normal in structure. Aortic valve regurgitation is not visualized. The aortic valve is structurally normal, with no evidence of sclerosis or stenosis.  Aortic valve peak gradient measures 5.3 mmHg. Pulmonic Valve: The pulmonic valve was normal in structure. Pulmonic valve regurgitation is not visualized. Pulmonic regurgitation is not visualized. Aorta: The aortic root, ascending aorta and aortic arch are all structurally normal, with no evidence of dilitation or obstruction. Venous: The inferior vena cava is normal in size with greater than 50% respiratory variability, suggesting right atrial pressure of 3 mmHg. IAS/Shunts: No atrial level shunt detected by color flow Doppler. There is no evidence of a patent foramen ovale. No ventricular septal defect is seen or detected. There is no evidence of an atrial septal defect.  LEFT VENTRICLE PLAX 2D LVIDd:         4.17 cm  Diastology LVIDs:         2.87 cm  LV e' lateral:   10.10 cm/s LV PW:         1.34 cm  LV E/e' lateral: 8.5 LV IVS:        1.11 cm  LV e' medial:    6.31 cm/s LVOT diam:     2.00 cm  LV E/e' medial:  13.5 LV SV:         46 ml LV SV Index:   22.79 LVOT Area:     3.14 cm  RIGHT VENTRICLE RV Basal diam:  1.89 cm RV S prime:     11.90 cm/s TAPSE (M-mode): 1.6 cm LEFT ATRIUM             Index       RIGHT ATRIUM           Index LA diam:        3.10 cm 1.58 cm/m  RA Area:     13.10 cm LA Vol (A2C):   43.8 ml 22.27 ml/m RA Volume:   28.50 ml  14.49 ml/m LA Vol (A4C):   29.2 ml 14.84 ml/m LA Biplane Vol: 35.8 ml 18.20 ml/m  AORTIC VALVE                PULMONIC VALVE AV Area (Vmax): 2.32 cm    PV Vmax:       0.96 m/s AV Vmax:        115.00 cm/s PV Peak grad:  3.7 mmHg AV Peak Grad:   5.3 mmHg LVOT Vmax:      85.00 cm/s LVOT Vmean:     55.500 cm/s LVOT VTI:       0.155 m  AORTA Ao Root diam: 3.50 cm Ao Asc diam:  3.20 cm MITRAL VALVE MV Area (PHT): 3.48 cm             SHUNTS MV PHT:        63.22 msec  Systemic VTI:  0.16 m MV Decel Time: 218 msec             Systemic Diam: 2.00 cm MV E velocity: 85.40 cm/s 103 cm/s MV A velocity: 83.90 cm/s 70.3 cm/s MV E/A ratio:  1.02       1.5  Ida Rogue MD Electronically signed by Ida Rogue MD Signature Date/Time: 05/13/2019/4:55:52 PM    Final    CT HEAD CODE STROKE WO CONTRAST  Result Date: 05/12/2019 CLINICAL DATA:  Code stroke. Neuro deficit, acute, stroke suspected. Patient reports headache with left-sided weakness for 30 minutes. History of brain bleed and AVM EXAM: CT HEAD WITHOUT CONTRAST TECHNIQUE: Contiguous axial images were obtained from the base of the skull through the vertex without intravenous contrast. COMPARISON:  MRI/MRA head 12/25/2018, head CT 12/25/2018 FINDINGS: Brain: No evidence of acute intracranial hemorrhage. No acute demarcated cortical infarction is identified. 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 Also redemonstrated is a small chronic lacunar infarct within the left cerebellum. Stable background generalized parenchymal atrophy and chronic small vessel ischemic disease. Unchanged small focus of hyperdensity corresponding with a known posterior left temporal lobe AVM (series 3, image 16). No evidence of intracranial mass. No midline shift or extra-axial fluid collection. 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 Vascular: Left temporal lobe AVM as described. No hyperdense vessel. Atherosclerotic calcifications. Skull: Normal. Negative for fracture or focal lesion. Sinuses/Orbits: Visualized orbits demonstrate no acute abnormality. No significant paranasal sinus disease or mastoid effusion at the imaged levels. These results were called by telephone at the time of interpretation on 05/12/2019 at 1:50 pm to provider MARK QUALE , who verbally acknowledged these results. IMPRESSION: 1. No CT evidence of acute intracranial abnormality. 2. 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 3. Stable generalized parenchymal atrophy and chronic small vessel ischemic disease. 4. Small focus of hyperdensity  corresponds with a known small left temporal lobe AVM. 5. 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 Electronically Signed   By: Kellie Simmering DO   On: 05/12/2019 13:51       Subjective: Pt c/o fatigue  Discharge Exam: Vitals:   05/14/19 0635 05/14/19 0823  BP: (!) 132/91 131/86  Pulse: 83 71  Resp:  17  Temp:  97.8 F (36.6 C)  SpO2: 97% 98%   Vitals:   05/14/19 0015 05/14/19 0304 05/14/19 0635 05/14/19 0823  BP: 110/80 123/81 (!) 132/91 131/86  Pulse: 82 87 83 71  Resp: 16 16  17   Temp: 98 F (36.7 C)   97.8 F (36.6 C)  TempSrc: Oral     SpO2: 96% 97% 97% 98%  Weight:      Height:        General: Pt is alert, awake, not in acute distress Cardiovascular: S1/S2 +, no rubs, no gallops Respiratory: CTA bilaterally, no wheezing, no rales Abdominal: Soft, NT, ND, bowel sounds + Extremities: no edema, no cyanosis    The results of significant diagnostics from this hospitalization (including imaging, microbiology, ancillary and laboratory) are listed below for reference.     Microbiology: Recent Results (from the past 240 hour(s))  Respiratory Panel by RT PCR (Flu A&B, Covid) - Nasopharyngeal Swab     Status: None   Collection Time: 05/12/19  1:33 PM   Specimen: Nasopharyngeal Swab  Result Value Ref Range Status   SARS Coronavirus 2 by RT PCR NEGATIVE NEGATIVE Final    Comment: (  NOTE) SARS-CoV-2 target nucleic acids are NOT DETECTED. The SARS-CoV-2 RNA is generally detectable in upper respiratoy specimens during the acute phase of infection. The lowest concentration of SARS-CoV-2 viral copies this assay can detect is 131 copies/mL. A negative result does not preclude SARS-Cov-2 infection and should not be used as the sole basis for treatment or other patient management decisions. A negative result may occur with  improper specimen collection/handling, submission of specimen other than nasopharyngeal swab, presence of viral mutation(s) within the areas  targeted by this assay, and inadequate number of viral copies (<131 copies/mL). A negative result must be combined with clinical observations, patient history, and epidemiological information. The expected result is Negative. Fact Sheet for Patients:  PinkCheek.be Fact Sheet for Healthcare Providers:  GravelBags.it This test is not yet ap proved or cleared by the [place] FDA and  has been authorized for detection and/or diagnosis of SARS-CoV-2 by FDA under an Emergency Use Authorization (EUA). This EUA will remain  in effect (meaning this test can be used) for the duration of the COVID-19 declaration under Section 564(b)(1) of the Act, 21 U.S.C. section 360bbb-3(b)(1), unless the authorization is terminated or revoked sooner.    Influenza A by PCR NEGATIVE NEGATIVE Final   Influenza B by PCR NEGATIVE NEGATIVE Final    Comment: (NOTE) The Xpert Xpress SARS-CoV-2/FLU/RSV assay is intended as an aid in  the diagnosis of influenza from Nasopharyngeal swab specimens and  should not be used as a sole basis for treatment. Nasal washings and  aspirates are unacceptable for Xpert Xpress SARS-CoV-2/FLU/RSV  testing. Fact Sheet for Patients: PinkCheek.be Fact Sheet for Healthcare Providers: GravelBags.it This test is not yet approved or cleared by the [place] FDA and  has been authorized for detection and/or diagnosis of SARS-CoV-2 by  FDA under an Emergency Use Authorization (EUA). This EUA will remain  in effect (meaning this test can be used) for the duration of the  Covid-19 declaration under Section 564(b)(1) of the Act, 21  U.S.C. section 360bbb-3(b)(1), unless the authorization is  terminated or revoked. Performed at [facility], [address] [place], [place] [address]      Labs: BNP (last 3 results) No results for input(s): BNP in the last  8760 hours. Basic Metabolic Panel: Recent Labs  Lab 05/12/19 1333 05/13/19 0506 05/14/19 0351  NA 137 136 136  K 4.0 3.9 3.5  CL 102 103 104  CO2 21* 25 26  GLUCOSE 189* 107* 171*  BUN 13 13 13   CREATININE 1.02 0.89 0.92  CALCIUM 9.3 8.4* 8.2*   Liver Function Tests: Recent Labs  Lab 05/12/19 1333  AST 41  ALT 39  ALKPHOS 73  BILITOT 1.2  PROT 7.8  ALBUMIN 4.8   No results for input(s): LIPASE, AMYLASE in the last 168 hours. No results for input(s): AMMONIA in the last 168 hours. CBC: Recent Labs  Lab 05/12/19 1333 05/13/19 0506 05/14/19 0351  WBC 5.7 4.2 3.5*  NEUTROABS 3.0  --   --   HGB 15.9 14.0 13.4  HCT 44.5 41.3 40.5  MCV 87.6 92.6 92.7  PLT 201 178 153   Cardiac Enzymes: No results for input(s): CKTOTAL, CKMB, CKMBINDEX, TROPONINI in the last 168 hours. BNP: Invalid input(s): POCBNP CBG: Recent Labs  Lab 05/13/19 0812 05/13/19 1128 05/13/19 1634 05/13/19 2108 05/14/19 0825  GLUCAP 102* 160* 98 137* 101*   D-Dimer No results for input(s): DDIMER in the last 72 hours. Hgb A1c Recent Labs  05/13/19 0506  HGBA1C 6.9*   Lipid Profile Recent Labs    05/13/19 0506  CHOL 185  HDL 30*  LDLCALC 96  TRIG 296*  CHOLHDL 6.2   Thyroid function studies No results for input(s): TSH, T4TOTAL, T3FREE, THYROIDAB in the last 72 hours.  Invalid input(s): FREET3 Anemia work up No results for input(s): VITAMINB12, FOLATE, FERRITIN, TIBC, IRON, RETICCTPCT in the last 72 hours. Urinalysis    Component Value Date/Time   COLORURINE YELLOW (A) 05/13/2019 0616   APPEARANCEUR CLEAR (A) 05/13/2019 0616   LABSPEC 1.021 05/13/2019 0616   PHURINE 6.0 05/13/2019 0616   GLUCOSEU NEGATIVE 05/13/2019 0616   HGBUR NEGATIVE 05/13/2019 0616   BILIRUBINUR NEGATIVE 05/13/2019 0616   KETONESUR NEGATIVE 05/13/2019 0616   PROTEINUR NEGATIVE 05/13/2019 0616   NITRITE NEGATIVE 05/13/2019 0616   LEUKOCYTESUR NEGATIVE 05/13/2019 0616   Sepsis Labs Invalid  input(s): PROCALCITONIN,  WBC,  LACTICIDVEN Microbiology Recent Results (from the past 240 hour(s))  Respiratory Panel by RT PCR (Flu A&B, Covid) - Nasopharyngeal Swab     Status: None   Collection Time: 05/12/19  1:33 PM   Specimen: Nasopharyngeal Swab  Result Value Ref Range Status   SARS Coronavirus 2 by RT PCR NEGATIVE NEGATIVE Final    Comment: (NOTE) SARS-CoV-2 target nucleic acids are NOT DETECTED. The SARS-CoV-2 RNA is generally detectable in upper respiratoy specimens during the acute phase of infection. The lowest concentration of SARS-CoV-2 viral copies this assay can detect is 131 copies/mL. A negative result does not preclude SARS-Cov-2 infection and should not be used as the sole basis for treatment or other patient management decisions. A negative result may occur with  improper specimen collection/handling, submission of specimen other than nasopharyngeal swab, presence of viral mutation(s) within the areas targeted by this assay, and inadequate number of viral copies (<131 copies/mL). A negative result must be combined with clinical observations, patient history, and epidemiological information. The expected result is Negative. Fact Sheet for Patients:  PinkCheek.be Fact Sheet for Healthcare Providers:  GravelBags.it This test is not yet ap proved or cleared by the [place] FDA and  has been authorized for detection and/or diagnosis of SARS-CoV-2 by FDA under an Emergency Use Authorization (EUA). This EUA will remain  in effect (meaning this test can be used) for the duration of the COVID-19 declaration under Section 564(b)(1) of the Act, 21 U.S.C. section 360bbb-3(b)(1), unless the authorization is terminated or revoked sooner.    Influenza A by PCR NEGATIVE NEGATIVE Final   Influenza B by PCR NEGATIVE NEGATIVE Final    Comment: (NOTE) The Xpert Xpress SARS-CoV-2/FLU/RSV assay is intended as an aid in   the diagnosis of influenza from Nasopharyngeal swab specimens and  should not be used as a sole basis for treatment. Nasal washings and  aspirates are unacceptable for Xpert Xpress SARS-CoV-2/FLU/RSV  testing. Fact Sheet for Patients: PinkCheek.be Fact Sheet for Healthcare Providers: GravelBags.it This test is not yet approved or cleared by the [place] FDA and  has been authorized for detection and/or diagnosis of SARS-CoV-2 by  FDA under an Emergency Use Authorization (EUA). This EUA will remain  in effect (meaning this test can be used) for the duration of the  Covid-19 declaration under Section 564(b)(1) of the Act, 21  U.S.C. section 360bbb-3(b)(1), unless the authorization is  terminated or revoked. Performed at [facility], [address] [place], [place] [address]      Time coordinating discharge: Over 30 minutes  SIGNED:  Wyvonnia Dusky, MD  [facility] 05/14/2019, 10:02 AM Pager   If 7PM-7AM, please contact night-coverage [email or web address] Password [email or web address]

## 2019-05-14 NOTE — Progress Notes (Signed)
IV removed before discharge. Educated patient and patients wife on discharge instructions and medications. They both agreed that they understood and did not have any questions.

## 2019-05-25 ENCOUNTER — Emergency Department: Payer: Medicare Other

## 2019-05-25 ENCOUNTER — Other Ambulatory Visit: Payer: Self-pay

## 2019-05-25 ENCOUNTER — Encounter: Payer: Self-pay | Admitting: Emergency Medicine

## 2019-05-25 ENCOUNTER — Emergency Department
Admission: EM | Admit: 2019-05-25 | Discharge: 2019-05-25 | Disposition: A | Discharge status: Home / Self Care | Payer: Medicare Other | Attending: Emergency Medicine | Admitting: Emergency Medicine

## 2019-05-25 DIAGNOSIS — Z8673 Personal history of transient ischemic attack (TIA), and cerebral infarction without residual deficits: Secondary | ICD-10-CM | POA: Insufficient documentation

## 2019-05-25 DIAGNOSIS — E119 Type 2 diabetes mellitus without complications: Secondary | ICD-10-CM | POA: Diagnosis not present

## 2019-05-25 DIAGNOSIS — M79602 Pain in left arm: Secondary | ICD-10-CM | POA: Diagnosis present

## 2019-05-25 DIAGNOSIS — I808 Phlebitis and thrombophlebitis of other sites: Secondary | ICD-10-CM

## 2019-05-25 DIAGNOSIS — Z79899 Other long term (current) drug therapy: Secondary | ICD-10-CM | POA: Insufficient documentation

## 2019-05-25 DIAGNOSIS — I10 Essential (primary) hypertension: Secondary | ICD-10-CM | POA: Diagnosis not present

## 2019-05-25 DIAGNOSIS — Z7984 Long term (current) use of oral hypoglycemic drugs: Secondary | ICD-10-CM | POA: Insufficient documentation

## 2019-05-25 DIAGNOSIS — Z7982 Long term (current) use of aspirin: Secondary | ICD-10-CM | POA: Insufficient documentation

## 2019-05-25 MED ORDER — NAPROXEN 500 MG PO TABS: 500.0000 mg | ORAL_TABLET | Freq: Two times a day (BID) | ORAL | 0 refills | Status: DC

## 2019-05-25 NOTE — Discharge Instructions (Addendum)
Follow-up with your primary care provider if any continued problems or concerns.  You may return to the emergency department over the weekend if there is any worsening of your symptoms or urgent concerns.  Begin taking naproxen 500 mg twice daily with food.  Also use warm compresses as we discussed frequently throughout the day.

## 2019-05-25 NOTE — ED Notes (Signed)
See triage note. Pt reports sensation that there may be a piece of IV catheter in his arm x2 wks since being discharged from hospital. Site in below L AC medially. Small scab noted to insertion site. Unable to palpate any obvious mass. C/o pain to site.

## 2019-05-25 NOTE — ED Notes (Signed)

## 2019-05-25 NOTE — ED Provider Notes (Signed)
[facility] Emergency Department Provider Note  ____________________________________________   None    (approximate)  I have reviewed the triage vital signs and the nursing notes.   HISTORY  Chief Complaint Foreign Body in Skin   HPI Jon [name] is a 69 y.o. male presents to the ED of the sensation that a piece of his IV catheter is still in his arm.  Patient states scissors area in his left arm that feels hard and from time to time swells.  Patient is still able to use his arm and flexing stand without any difficulty.  Patient was hospitalized on 05/12/2019 and had an IV in his left arm.  Patient denies any fever, chills, nausea or vomiting.  He rates his pain as a 2 out of 10.      Past Medical History:  Diagnosis Date  . Diabetes mellitus without complication ([place])   . Hypertension   . Pituitary tumor   . TIA (transient ischemic attack)     Patient Active Problem List   Diagnosis Date Noted  . CVA (cerebral vascular accident) ([place]) 05/13/2019  . Migraine 05/12/2019  . Depression 05/12/2019  . Hypertension   . Diabetes mellitus without complication ([place])   . Left arm weakness   . Hypogonadism in male 03/13/2019  . Pituitary adenoma ([place]) 03/13/2019  . History of stroke 11/29/2018  . Hx of adenomatous colonic polyps 11/29/2018  . Other nontraumatic intracerebral hemorrhage ([place]) 09/06/2018  . Occipital stroke ([place]) 08/19/2018  . 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 Left-sided weakness 06/02/2018  . HTN (hypertension) 06/02/2018  . Diabetes ([place]) 06/02/2018  . GERD (gastroesophageal reflux disease) 06/02/2018  . Migraine without aura and without status migrainosus, not intractable 01/04/2018  . 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 Headache 07/30/2017  . 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 Chronic pain of left knee 05/05/2017  . 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 TIA (transient ischemic attack) 05/01/2017    . Pseudophakia of left eye 03/14/2017  . OSA (obstructive sleep apnea) 11/02/2016  . 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 Hyperlipidemia, mixed 04/29/2015  . 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 Rhytides 01/04/2013  . Gross hematuria 04/11/2012  . Nephrolithiasis 04/11/2012  . Heartburn 11/04/2010    Past Surgical History:  Procedure Laterality Date  . COLONOSCOPY    . EYE SURGERY    . HERNIA REPAIR      Prior to Admission medications   Medication Sig Start Date End Date Taking? Authorizing Provider  acetaminophen (TYLENOL) 500 MG tablet Take 1,500 mg by mouth every 6 (six) hours as needed for moderate pain.     [provider]  amLODipine (NORVASC) 5 MG tablet Take 5 mg by mouth daily.  09/06/18 09/06/19  [provider]  aspirin EC 81 MG EC tablet Take 1 tablet (81 mg total) by mouth daily. 05/14/19 06/13/19  Wyvonnia Dusky, MD  butalbital-acetaminophen-caffeine (FIORICET WITH CODEINE) [phone] MG capsule Take 1 capsule by mouth every 4 (four) hours as needed for headache.    [provider]  carbidopa-levodopa (SINEMET IR) 25-100 MG tablet Take 1 tablet by mouth 3 (three) times daily. 05/09/19   [provider]  Cholecalciferol (D3-1000) 1000 units tablet Take 1,000 Units by mouth daily.    [provider]  doxepin (SINEQUAN) 10 MG capsule Take 10 mg by mouth at bedtime.  02/25/19   [provider]  DULoxetine (CYMBALTA) 60 MG capsule Take 60 mg by mouth daily.    [provider]  glimepiride (AMARYL) 4 MG tablet Take 4 mg by mouth 2 (two) times daily.    [provider]  hydrOXYzine (ATARAX/VISTARIL) 10 MG tablet Take 10 mg by mouth 3 (three) times daily as needed for itching.     [provider]  ibuprofen (ADVIL,MOTRIN) 200 MG tablet Take 400-800 mg by mouth every 6 (six) hours as needed for headache or mild  pain.     [provider]  losartan-hydrochlorothiazide (HYZAAR) 100-12.5 MG tablet Take 1 tablet by mouth daily.    [provider]  naproxen (NAPROSYN) 500 MG tablet Take 1 tablet (500 mg total) by mouth 2 (two) times daily with a meal. 05/25/19   Letitia Neri L, PA-C  pantoprazole (PROTONIX) 40 MG tablet Take 40 mg by mouth daily. 06/27/17   [provider]  pioglitazone (ACTOS) 30 MG tablet Take 30 mg by mouth daily.  11/08/18 11/08/19  [provider]  rosuvastatin (CRESTOR) 10 MG tablet Take 10 mg by mouth daily.  11/08/18 11/08/19  [provider]  traZODone (DESYREL) 50 MG tablet Take 50 mg by mouth at bedtime.     [provider]    Allergies Beta adrenergic blockers, Topiramate, Aspirin, Benadryl [diphenhydramine], Lisinopril, Nabumetone, and Valproic acid  Family History  Problem Relation Age of Onset  . Diabetes Mellitus II Other        Maternal aunt    Social History Social History   Tobacco Use  . Smoking status: Never Smoker  . Smokeless tobacco: Never Used  Substance Use Topics  . Alcohol use: Never  . Drug use: Never    Review of Systems Constitutional: No fever/chills Cardiovascular: Denies chest pain. Respiratory: Denies shortness of breath. Gastrointestinal: No abdominal pain.  No nausea, no vomiting.   Musculoskeletal: Pain left forearm. Skin: Negative for rash. Neurological: Negative for headaches, focal weakness or numbness. ____________________________________________   PHYSICAL EXAM:  VITAL SIGNS: ED Triage Vitals  Enc Vitals Group     BP 05/25/19 0745 (!) 141/93     Pulse Rate 05/25/19 0745 82     Resp 05/25/19 0745 18     Temp 05/25/19 0745 98.5 F (36.9 C)     Temp Source 05/25/19 0745 Oral     SpO2 05/25/19 0745 98 %     Weight 05/25/19 0750 180 lb (81.6 kg)     Height 05/25/19 0750 5\' 6"  (1.676 m)     Head Circumference --      Peak Flow --      Pain Score 05/25/19 0750 2     Pain Loc  --      Pain Edu? --      Excl. in [place]? --    Constitutional: Alert and oriented. Well appearing and in no acute distress. Eyes: Conjunctivae are normal.  Head: Atraumatic. HENT: Left EAC is partially occluded with cerumen but TM is visible and nonerythematous.  Right EAC is completely occluded with cerumen and TM is not visible. Neck: No stridor.   Cardiovascular: Normal rate, regular rhythm. Grossly normal heart sounds.  Good peripheral circulation. Respiratory: Normal respiratory effort.  No retractions. Lungs CTAB. Musculoskeletal: Is able move upper and lower extremities that any difficulty.  Patient does walk with the assistance of a cane.  The left upper arm patient is able to fully extend and flex without any difficulty. Neurologic:  Normal speech and language.  No gross focal neurologic deficits are appreciated. No gait instability. Skin:  Skin is warm, dry and intact.  Left forearm just below the antecubital space and area where he had his IV there is a very firm ropelike area measuring approximately 1.5 cm.  No erythema in the area.  Pulses present distal to this area. Psychiatric: Mood and affect are normal. Speech and behavior are normal.  ____________________________________________   LABS (all labs ordered are listed, but only abnormal results are displayed)  Labs Reviewed - No data to display  RADIOLOGY  Official radiology report(s): US Venous Img Upper Uni Left  Result Date: 05/25/2019 CLINICAL DATA:  Left upper extremity pain and edema. Recent admission with IV placement. Evaluate for DVT. EXAM: LEFT UPPER EXTREMITY VENOUS DOPPLER ULTRASOUND TECHNIQUE: Gray-scale sonography with graded compression, as well as color Doppler and duplex ultrasound were performed to evaluate the upper extremity deep venous system from the level of the subclavian vein and including the jugular, axillary, basilic, radial, ulnar and upper cephalic vein. Spectral Doppler was utilized to evaluate  flow at rest and with distal augmentation maneuvers. COMPARISON:  None. FINDINGS: Contralateral Subclavian Vein: Respiratory phasicity is normal and symmetric with the symptomatic side. No evidence of thrombus. Normal compressibility. Internal Jugular Vein: No evidence of thrombus. Normal compressibility, respiratory phasicity and response to augmentation. Subclavian Vein: No evidence of thrombus. Normal compressibility, respiratory phasicity and response to augmentation. Axillary Vein: No evidence of thrombus. Normal compressibility, respiratory phasicity and response to augmentation. Cephalic Vein: No evidence of thrombus. Normal compressibility, respiratory phasicity and response to augmentation. Basilic Vein: There is hypoechoic occlusive thrombus within the left basilic vein at the level of the antecubital fossa, correlating with the patient's palpable area of concern (images 23 through 25. The basilic vein appears patent centrally (image 23). Brachial Veins: No evidence of thrombus. Normal compressibility, respiratory phasicity and response to augmentation. Radial Veins: No evidence of thrombus. Normal compressibility, respiratory phasicity and response to augmentation. Ulnar Veins: No evidence of thrombus. Normal compressibility, respiratory phasicity and response to augmentation. Venous Reflux:  None visualized. Other Findings:  None visualized. IMPRESSION: 1. No evidence of DVT within the left upper extremity. 2. Examination is positive for occlusive superficial thrombophlebitis involving the left basilic vein at the level of the antecubital fossa. The more central aspect of the basilic vein appears patent and there is no extension of this occlusive SVT to the deep venous system of the left lower extremity. Electronically Signed   By: Sandi Mariscal M.D.   On: 05/25/2019 10:52    ____________________________________________   PROCEDURES  Procedure(s) performed (including Critical  Care):  Procedures   ____________________________________________   INITIAL IMPRESSION / ASSESSMENT AND PLAN / ED COURSE  As part of my medical decision making, I reviewed the following data within the electronic MEDICAL RECORD NUMBER Notes from prior ED visits and [place] Controlled Substance Database  69 year old male presents to the ED with complaint of left arm foreign body sensation after being in admitted and having an IV earlier this month.  Area is suspicious for thrombophlebitis.  Patient has full range of motion in his left upper extremity.  There is no fever chills and no erythema to the area.  Pulses present distal to the area and capillary refill is less than 3 seconds.  Ultrasound does show a superficial thrombophlebitis of the left upper extremity.  Patient was made aware.  He was given a prescription for naproxen 500 mg twice daily and told to use warm compresses  frequently to this area.  He is to follow-up with his PCP and instructed to call make an appointment for follow-up.  If he develops any worsening of his symptoms or urgent concerns he is instructed to return to the emergency department.  ____________________________________________   FINAL CLINICAL IMPRESSION(S) / ED DIAGNOSES  Final diagnoses:  Thrombophlebitis of left upper extremity     ED Discharge Orders         Ordered    naproxen (NAPROSYN) 500 MG tablet  2 times daily with meals     05/25/19 1118           Note:  This document was prepared using Dragon voice recognition software and may include unintentional dictation errors.    Johnn Hai, PA-C 05/25/19 1143    Harvest Dark, MD 05/25/19 1415

## 2019-05-25 NOTE — ED Triage Notes (Signed)
Discharged x2 weeks from admission , pt complaining of left arm pain at insertion site of previous IV catheter. Pt feels that there is something still in his arm from the catheter

## 2019-06-15 ENCOUNTER — Other Ambulatory Visit: Payer: Self-pay | Admitting: Family Medicine

## 2019-06-15 DIAGNOSIS — E291 Testicular hypofunction: Secondary | ICD-10-CM

## 2019-06-18 ENCOUNTER — Other Ambulatory Visit: Payer: Self-pay

## 2019-06-20 ENCOUNTER — Other Ambulatory Visit: Payer: Self-pay

## 2019-06-20 ENCOUNTER — Other Ambulatory Visit: Payer: Medicare Other

## 2019-06-20 DIAGNOSIS — E291 Testicular hypofunction: Secondary | ICD-10-CM

## 2019-06-20 DIAGNOSIS — Z125 Encounter for screening for malignant neoplasm of prostate: Secondary | ICD-10-CM

## 2019-06-21 ENCOUNTER — Ambulatory Visit: Payer: Self-pay | Admitting: Urology

## 2019-06-21 ENCOUNTER — Ambulatory Visit (INDEPENDENT_AMBULATORY_CARE_PROVIDER_SITE_OTHER): Payer: Medicare Other | Admitting: Urology

## 2019-06-21 ENCOUNTER — Encounter: Payer: Self-pay | Admitting: Urology

## 2019-06-21 VITALS — BP 132/79 | HR 84 | Ht 66.0 in | Wt 190.4 lb

## 2019-06-21 DIAGNOSIS — E349 Endocrine disorder, unspecified: Secondary | ICD-10-CM

## 2019-06-21 LAB — HEMOGLOBIN: Hemoglobin: 14.3 g/dL (ref 13.0–17.7)

## 2019-06-21 LAB — TESTOSTERONE: Testosterone: 218 ng/dL — ABNORMAL LOW (ref 264–916)

## 2019-06-21 LAB — HEMATOCRIT: Hematocrit: 41.2 % (ref 37.5–51.0)

## 2019-06-21 LAB — PSA: Prostate Specific Ag, Serum: 0.9 ng/mL (ref 0.0–4.0)

## 2019-06-21 NOTE — Progress Notes (Signed)
06/21/2019 10:00 AM   Jon [name] June 19, 1950 [id number]  Referring provider: Idelle Crouch, MD [place] [facility] [place],  [place] [address]  Chief Complaint  Patient presents with  . 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    He has not followed up with his neurologist as instructed after discharge.    He was last seen in our office on 03/20/2019 for a Testopel insertion.  He states he is feeling fatigued and feels much better after he has received testosterone therapy.    Component     Latest Ref Rng & Units 06/20/2019  Testosterone     264 - 916 ng/dL 218 (L)   He does not complain of any urinary issues.  Patient denies any modifying or aggravating factors.  Patient denies any gross hematuria, dysuria or suprapubic/flank pain.  Patient denies any fevers, chills, nausea or vomiting.   He states he is no longer having spontaneous erections.   PMH: Past Medical History:  Diagnosis Date  . Diabetes mellitus without complication ([place])   . Hypertension   . Pituitary tumor   . TIA (transient ischemic attack)     Surgical History: Past Surgical History:  Procedure Laterality Date  . COLONOSCOPY    . EYE SURGERY    . HERNIA REPAIR      Home Medications:  Allergies as of 06/21/2019      Reactions   Beta Adrenergic Blockers Itching   Topiramate Nausea And Vomiting   Other reaction(s): Dizziness, Hallucination Other Reaction: violence   Aspirin Other (See Comments)   Per pt's PCP due to spontaneous bleeding in his brain.   Benadryl [diphenhydramine] Hives   Lisinopril Itching   Other reaction(s): Other (See Comments) Leg pain   Nabumetone Other (See Comments)   Reaction: unknown   Valproic Acid Other (See Comments)   Reaction: Headache      Medication List       Accurate as of June 21, 2019 10:00 AM. If you have any questions, ask your nurse or doctor.        STOP taking these medications   naproxen 500 MG tablet Commonly known as: Naprosyn Stopped by: Jashun Puertas, PA-C   pioglitazone 30 MG tablet Commonly known as: ACTOS Stopped by: Onur Mori, PA-C     TAKE these medications   acetaminophen 500 MG tablet Commonly known as: TYLENOL Take 1,500 mg by mouth every 6 (six) hours as needed for moderate pain.   amLODipine 5 MG tablet Commonly known as: NORVASC Take 5 mg by mouth daily.   butalbital-acetaminophen-caffeine 50-325-40-30 MG capsule Commonly known as: FIORICET WITH CODEINE Take 1 capsule by mouth every 4 (four) hours as needed for headache.   carbidopa-levodopa 25-100 MG tablet Commonly known as: SINEMET IR Take 1 tablet by mouth 3 (three) times daily.   D3-1000 25 MCG (1000 UT) tablet Generic drug: Cholecalciferol Take 1,000 Units by mouth daily.   doxepin 10 MG capsule Commonly known as: SINEQUAN Take 10 mg by mouth at bedtime.   DULoxetine 60 MG capsule Commonly known as: CYMBALTA Take 60 mg by mouth daily.   glimepiride 4 MG tablet Commonly known as: AMARYL Take 4 mg by mouth 2 (two) times daily.   hydrOXYzine 10 MG tablet Commonly known as: ATARAX/VISTARIL Take  10 mg by mouth 3 (three) times daily as needed for itching.   ibuprofen 200 MG tablet Commonly known as: ADVIL Take 400-800 mg by mouth every 6 (six) hours as needed for headache or mild pain.   losartan-hydrochlorothiazide 100-12.5 MG tablet Commonly known as: HYZAAR Take 1 tablet by mouth daily.   pantoprazole 40 MG tablet Commonly known as: PROTONIX Take 40 mg by mouth daily.   rosuvastatin 10 MG tablet Commonly known as: CRESTOR Take 10 mg by mouth daily.   traZODone 50 MG tablet Commonly known as: DESYREL Take 50 mg by mouth at bedtime.       Allergies:  Allergies  Allergen Reactions  . Beta Adrenergic  Blockers Itching  . Topiramate Nausea And Vomiting    Other reaction(s): Dizziness, Hallucination Other Reaction: violence  . Aspirin Other (See Comments)    Per pt's PCP due to spontaneous bleeding in his brain.  . Benadryl [Diphenhydramine] Hives  . Lisinopril Itching    Other reaction(s): Other (See Comments) Leg pain  . Nabumetone Other (See Comments)    Reaction: unknown  . Valproic Acid Other (See Comments)    Reaction: Headache    Family History: Family History  Problem Relation Age of Onset  . Diabetes Mellitus II Other        Maternal aunt    Social History:  reports that he has never smoked. He has never used smokeless tobacco. He reports that he does not drink alcohol or use drugs.  ROS: Pertinent ROS in HPI  Physical Exam: BP 132/79   Pulse 84   Ht 5\' 6"  (1.676 m)   Wt 190 lb 6.4 oz (86.4 kg)   BMI 30.73 kg/m   Constitutional:  Well nourished. Alert and oriented, No acute distress. HEENT: [place] AT, mask in place.  Trachea midline, no masses. Cardiovascular: No clubbing, cyanosis, or edema. Respiratory: Normal respiratory effort, no increased work of breathing. GU: No CVA tenderness.  No bladder fullness or masses.  Patient with circumcised phallus.  Urethral meatus is patent.  No penile discharge. No penile lesions or rashes. Scrotum without lesions, cysts, rashes and/or edema.  Testicles are located scrotally bilaterally. No masses are appreciated in the testicles. Left and right epididymis are normal. Rectal: Patient with  normal sphincter tone. Anus and perineum without scarring or rashes. No rectal masses are appreciated. Prostate is approximately 50 grams, no nodules are appreciated. Seminal vesicles could not be palpated.   Skin: No rashes, bruises or suspicious lesions. Lymph: No inguinal adenopathy. Neurologic: Grossly intact, no focal deficits, moving all 4 extremities. Psychiatric: Normal mood and affect.  Laboratory Data: Lab Results  Component  Value Date   WBC 3.5 (L) 05/14/2019   HGB 14.3 06/20/2019   HCT 41.2 06/20/2019   MCV 92.7 05/14/2019   PLT 153 05/14/2019    Lab Results  Component Value Date   CREATININE 0.92 05/14/2019   Component     Latest Ref Rng & Units 06/20/2019  Prostate Specific Ag, Serum     0.0 - 4.0 ng/mL 0.9     Lab Results  Component Value Date   TESTOSTERONE 218 (L) 06/20/2019    Lab Results  Component Value Date   HGBA1C 6.9 (H) 05/13/2019       Component Value Date/Time   CHOL 185 05/13/2019 0506   HDL 30 (L) 05/13/2019 0506   CHOLHDL 6.2 05/13/2019 0506   VLDL 59 (H) 05/13/2019 0506   LDLCALC 96 05/13/2019 0506    Lab  Results  Component Value Date   AST 41 05/12/2019   Lab Results  Component Value Date   ALT 39 05/12/2019    Urinalysis    Component Value Date/Time   COLORURINE YELLOW (A) 05/13/2019 0616   APPEARANCEUR CLEAR (A) 05/13/2019 0616   LABSPEC 1.021 05/13/2019 0616   PHURINE 6.0 05/13/2019 0616   GLUCOSEU NEGATIVE 05/13/2019 0616   HGBUR NEGATIVE 05/13/2019 0616   BILIRUBINUR NEGATIVE 05/13/2019 0616   KETONESUR NEGATIVE 05/13/2019 0616   PROTEINUR NEGATIVE 05/13/2019 0616   NITRITE NEGATIVE 05/13/2019 0616   LEUKOCYTESUR NEGATIVE 05/13/2019 0616   I have reviewed the labs.   Assessment & Plan:    1. Testosterone deficiency Explained to patient and his wife that he will need clearance from neurology in order to continue testosterone therapy He will make an appointment ASAP with Dr. Manuella Ghazi and return if cleared for Testopel insertion  Return for pending neurology .  These notes generated with voice recognition software. I apologize for typographical errors.  Zara Council, PA-C  [facility] [address]  [place], [place] [address] [phone]

## 2019-08-16 ENCOUNTER — Encounter: Payer: Self-pay | Admitting: Urology

## 2019-08-16 ENCOUNTER — Other Ambulatory Visit: Payer: Self-pay

## 2019-08-16 ENCOUNTER — Ambulatory Visit (INDEPENDENT_AMBULATORY_CARE_PROVIDER_SITE_OTHER): Payer: Medicare Other | Admitting: Urology

## 2019-08-16 VITALS — BP 154/89 | HR 85 | Ht 66.0 in | Wt 190.0 lb

## 2019-08-16 DIAGNOSIS — E291 Testicular hypofunction: Secondary | ICD-10-CM | POA: Diagnosis not present

## 2019-08-16 DIAGNOSIS — E349 Endocrine disorder, unspecified: Secondary | ICD-10-CM | POA: Diagnosis not present

## 2019-08-16 MED ORDER — TESTOSTERONE 75 MG IL PLLT
75.0000 mg | PELLET | Freq: Once | Status: AC
  Administered 2019-08-16: 75 mg

## 2019-08-16 NOTE — Patient Instructions (Addendum)
Apply ice to the site for 20-30 minutes every hour if needed.  Avoid hot tubes, swimming or full water immersion of the insertion site for 72 hours.  Bandage may be removed after one week.    Please contact the office or seek treatment in the ED,  if experiencing drainage of the insertion site, excessive redness or swelling of the site, chills and/or fevers > 101.5, nausea or vomiting, dizziness or lightheadedness and excessive tenderness.  Avoid strenuous activity and heavy lifting for 72 hours.

## 2019-08-16 NOTE — Progress Notes (Signed)
This is a 69 -year-old male with hypogonadism and he is managed with Testopel. He presents today for Testopel insertion.  Patient is placed on the exam table in the right lateral jackknife position.  Identified upper outer quadrant of hip for insertion; prepped area with Betadine and injected 10 cc's of Lidocaine 1% with Epinephrine to anesthetize superficially and distally along trocar tract.  Made 3 mm incision using 14 blade of scalpel; trocar with sharp ended stylet was inserted into subcutaneous tissue in line with femur. Sharp stylet was withdrawn and 6 pellets were placed into trocar well. Testopel pellets advanced into tissue using blunt ended stylet. Trocar removed and incision closed using 6 Steri-Strips. Cleansed area to remove Betadine and covered Steri-Strips with outer Band-Aid.  Careful inspection of insertion is done and patient informed of post procedure instructions.  Advised patient to apply ice to the site for 20-30 minutes every hour if needed.  Avoid hot tubes, swimming or full water immersion of the insertion site for 72 hours.  Bandage may be removed after one week.    Patient is advised to contact the office if experiencing drainage of the insertion site, excessive redness or swelling of the site, chills and/or fevers > 101.5, nausea or vomiting, dizziness or lightheadedness and excessive tenderness.  Avoid strenuous activity and heavy lifting for 72 hours.     He will return in one month for serum testosterone.

## 2019-09-14 ENCOUNTER — Other Ambulatory Visit: Payer: Self-pay | Admitting: Family Medicine

## 2019-09-14 DIAGNOSIS — E349 Endocrine disorder, unspecified: Secondary | ICD-10-CM

## 2019-09-17 ENCOUNTER — Other Ambulatory Visit: Payer: Medicare Other

## 2019-09-17 ENCOUNTER — Other Ambulatory Visit: Payer: Self-pay

## 2019-09-17 DIAGNOSIS — E349 Endocrine disorder, unspecified: Secondary | ICD-10-CM

## 2019-09-18 ENCOUNTER — Telehealth: Payer: Self-pay | Admitting: Family Medicine

## 2019-09-18 DIAGNOSIS — E349 Endocrine disorder, unspecified: Secondary | ICD-10-CM

## 2019-09-18 LAB — TESTOSTERONE: Testosterone: 451 ng/dL (ref 264–916)

## 2019-09-18 NOTE — Telephone Encounter (Signed)
-----   Message from Nori Riis, PA-C sent at 09/18/2019  8:12 AM EDT ----- Please let Mr. Frede know that his testosterone level is within therapeutic range.  We need to recheck his testosterone level in two month prior to the next Testopel insertion.

## 2019-09-18 NOTE — Telephone Encounter (Signed)
LMOM for patient to return call.

## 2019-09-20 NOTE — Telephone Encounter (Signed)
Patient returned call, scheduled lab appointment

## 2019-09-20 NOTE — Telephone Encounter (Signed)
LMOM for patient to return call to schedule another Lab appointment.

## 2019-11-20 ENCOUNTER — Other Ambulatory Visit: Payer: Self-pay

## 2019-11-20 ENCOUNTER — Other Ambulatory Visit: Payer: Medicare Other

## 2019-11-20 DIAGNOSIS — E349 Endocrine disorder, unspecified: Secondary | ICD-10-CM

## 2019-11-21 LAB — TESTOSTERONE: Testosterone: 150 ng/dL — ABNORMAL LOW (ref 264–916)

## 2019-11-22 ENCOUNTER — Telehealth: Payer: Self-pay

## 2019-11-22 NOTE — Telephone Encounter (Signed)
-----   Message from [place], RN sent at 11/21/2019  8:14 AM EDT ----- Regarding: Testopel needs to be scheduled & get prior auth  ----- Message ----- From: Nori Riis, PA-C Sent: 11/21/2019   8:04 AM EDT To: Ranell Patrick, RN  Please let Mr. Regis know that his testosterone level is sub-therapeutic.  We need to get him scheduled for his next Testopel insertion.

## 2019-11-22 NOTE — Telephone Encounter (Signed)
Patient notified and scheduled for Testopel. No PA required patient has medicare unchanged from last insertion.

## 2019-12-11 ENCOUNTER — Encounter: Payer: Self-pay | Admitting: Urology

## 2019-12-11 ENCOUNTER — Ambulatory Visit (INDEPENDENT_AMBULATORY_CARE_PROVIDER_SITE_OTHER): Payer: Medicare Other | Admitting: Urology

## 2019-12-11 ENCOUNTER — Other Ambulatory Visit: Payer: Self-pay

## 2019-12-11 VITALS — BP 149/84 | HR 89 | Ht 66.0 in | Wt 177.0 lb

## 2019-12-11 DIAGNOSIS — E349 Endocrine disorder, unspecified: Secondary | ICD-10-CM

## 2019-12-11 DIAGNOSIS — E291 Testicular hypofunction: Secondary | ICD-10-CM | POA: Diagnosis not present

## 2019-12-11 MED ORDER — TESTOSTERONE 75 MG IL PLLT
75.0000 mg | PELLET | Freq: Once | Status: AC
  Administered 2019-12-11: 75 mg

## 2019-12-11 NOTE — Progress Notes (Signed)
This is a 69 -year-old male with testosterone deficiency and he is managed with Testopel. He presents today for Testopel insertion.  Patient is placed on the exam table in the left lateral jackknife position.  Identified upper outer quadrant of hip for insertion; prepped area with Betadine and injected 8 cc's of Lidocaine 1%  to anesthetize superficially and distally along trocar tract.  Made 3 mm incision using 11 blade of scalpel; trocar with sharp ended stylet was inserted into subcutaneous tissue in line with femur. Sharp stylet was withdrawn and 6 pellets were placed into trocar well. Testopel pellets advanced into tissue using blunt ended stylet. Trocar removed and incision closed using 6 Steri-Strips. Cleansed area to remove Betadine and covered Steri-Strips with outer Band-Aid.  Careful inspection of insertion is done and patient informed of post procedure instructions.  Advised patient to apply ice to the site for 20-30 minutes every hour if needed.  Avoid hot tubes, swimming or full water immersion of the insertion site for 72 hours.  Bandage may be removed after one week.    Patient is advised to contact the office if experiencing drainage of the insertion site, excessive redness or swelling of the site, chills and/or fevers > 101.5, nausea or vomiting, dizziness or lightheadedness and excessive tenderness.  Avoid strenuous activity and heavy lifting for 72 hours.     He will return in one month for serum testosterone.

## 2020-01-15 ENCOUNTER — Other Ambulatory Visit: Payer: Self-pay | Admitting: Family Medicine

## 2020-01-15 DIAGNOSIS — E349 Endocrine disorder, unspecified: Secondary | ICD-10-CM

## 2020-01-16 ENCOUNTER — Other Ambulatory Visit: Payer: Medicare Other

## 2020-01-16 ENCOUNTER — Other Ambulatory Visit: Payer: Self-pay

## 2020-01-16 DIAGNOSIS — E349 Endocrine disorder, unspecified: Secondary | ICD-10-CM

## 2020-01-17 ENCOUNTER — Telehealth: Payer: Self-pay | Admitting: Family Medicine

## 2020-01-17 LAB — TESTOSTERONE: Testosterone: 657 ng/dL (ref 264–916)

## 2020-01-17 NOTE — Telephone Encounter (Signed)
-----   Message from Nori Riis, PA-C sent at 01/17/2020  8:04 AM EDT ----- Please let Jon [name] know that his testosterone is at therapeutic levels and I would like to see him in two months for office visit (SHIM, I PSS and exam) and blood work (testosterone level, HCT, Hbg and PSA)

## 2020-01-17 NOTE — Telephone Encounter (Signed)
LMOM for patient to return call.

## 2020-01-18 NOTE — Telephone Encounter (Signed)
Patient notified and voiced understanding. Appointments have been made.  

## 2020-03-19 ENCOUNTER — Other Ambulatory Visit: Payer: Self-pay | Admitting: *Deleted

## 2020-03-19 ENCOUNTER — Other Ambulatory Visit: Payer: Medicare Other

## 2020-03-19 ENCOUNTER — Other Ambulatory Visit: Payer: Self-pay

## 2020-03-19 DIAGNOSIS — E291 Testicular hypofunction: Secondary | ICD-10-CM

## 2020-03-19 DIAGNOSIS — E349 Endocrine disorder, unspecified: Secondary | ICD-10-CM

## 2020-03-20 ENCOUNTER — Other Ambulatory Visit: Payer: Self-pay

## 2020-03-20 LAB — HEMOGLOBIN AND HEMATOCRIT, BLOOD
Hematocrit: 46.7 % (ref 37.5–51.0)
Hemoglobin: 15.8 g/dL (ref 13.0–17.7)

## 2020-03-20 LAB — TESTOSTERONE: Testosterone: 72 ng/dL — ABNORMAL LOW (ref 264–916)

## 2020-03-20 LAB — PSA: Prostate Specific Ag, Serum: 1.1 ng/mL (ref 0.0–4.0)

## 2020-03-24 NOTE — Progress Notes (Signed)
03/25/2020 8:45 AM   Jon [name] [birth year] [id number]  Referring provider: Idelle Crouch, MD [facility] [place],  [name] [id number]  Chief Complaint  Patient presents with  . Hypogonadism    HPI: Urological history  - testosterone deficiency  Testosterone deficiency No longer having spontaneous erections at night.   He has sleep apnea and is not sleeping with a CPAP machine.   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 This is unchanged when compared to a study from March 2019.  Component     Latest Ref Rng & Units 03/19/2020  Testosterone     264 - 916 ng/dL 72 (L)   Component     Latest Ref Rng & Units 03/19/2020  Hemoglobin     13.0 - 17.7 g/dL 15.8  HCT     37.5 - 51.0 % 46.7    BPH WITH LUTS  (prostate and/or bladder) IPSS score: 7/1    Major complaint(s):  None.  Patient denies any modifying or aggravating factors.  Patient denies any gross hematuria, dysuria or suprapubic/flank pain.  Patient denies any fevers, chills, nausea or vomiting.    IPSS    Row Name 03/25/20 0800         International Prostate Symptom Score   How often have you had the sensation of not emptying your bladder? Less than 1 in 5     How often have you had to urinate less than every two hours? Less than 1 in 5 times     How often have you found you stopped and started again several times when you urinated? Less than 1 in 5 times     How often have you found it difficult to postpone urination? Less than 1 in 5 times     How often have you had a weak urinary stream? Less than 1 in 5 times     How often have you had to strain to start urination? Less than 1 in 5 times     How many times did you typically get up at night to urinate? 1 Time     Total IPSS Score 7       Quality of Life due to  urinary symptoms   If you were to spend the rest of your life with your urinary condition just the way it is now how would you feel about that? Pleased            Score:  1-7 Mild 8-19 Moderate 20-35 Severe   Erectile dysfunction SHIM score: 5   Risk factors:  age, BPH, stroke, testosterone deficiency, DM, HTN, HLD and sleep apnea  No  painful erections or curvatures with his erections.    No longer having spontaneous erections.    SHIM    Row Name 03/25/20 0827         SHIM: Over the last 6 months:   How do you rate your confidence that you could get and keep an erection? Very Low     When you had erections with sexual stimulation, how often were your erections hard enough for penetration (entering your partner)? Almost Never or Never     During sexual intercourse, how often were you able to maintain your erection after you had penetrated (entered) your partner? Almost Never or Never  During sexual intercourse, how difficult was it to maintain your erection to completion of intercourse? Extremely Difficult     When you attempted sexual intercourse, how often was it satisfactory for you? Almost Never or Never       SHIM Total Score   SHIM 5            Score: 1-7 Severe ED 8-11 Moderate ED 12-16 Mild-Moderate ED 17-21 Mild ED 22-25 No ED  PMH: Past Medical History:  Diagnosis Date  . Diabetes mellitus without complication ([place])   . Hypertension   . Pituitary tumor   . TIA (transient ischemic attack)     Surgical History: Past Surgical History:  Procedure Laterality Date  . COLONOSCOPY    . EYE SURGERY    . HERNIA REPAIR      Home Medications:  Allergies as of 03/25/2020      Reactions   Beta Adrenergic Blockers Itching   Topiramate Nausea And Vomiting   Other reaction(s): Dizziness, Hallucination Other Reaction: violence   Aspirin Other (See Comments)   Per pt's PCP due to spontaneous bleeding in his brain.   Benadryl [diphenhydramine] Hives    Lisinopril Itching   Other reaction(s): Other (See Comments) Leg pain   Nabumetone Other (See Comments)   Reaction: unknown   Valproic Acid Other (See Comments)   Reaction: Headache      Medication List       Accurate as of March 25, 2020  8:45 AM. If you have any questions, ask your nurse or doctor.        acetaminophen 500 MG tablet Commonly known as: TYLENOL Take 1,500 mg by mouth every 6 (six) hours as needed for moderate pain.   amLODipine 5 MG tablet Commonly known as: NORVASC Take 5 mg by mouth daily.   butalbital-acetaminophen-caffeine 50-325-40-30 MG capsule Commonly known as: FIORICET WITH CODEINE Take 1 capsule by mouth every 4 (four) hours as needed for headache.   carbidopa-levodopa 25-100 MG tablet Commonly known as: SINEMET IR Take 1 tablet by mouth 3 (three) times daily.   D3-1000 25 MCG (1000 UT) tablet Generic drug: Cholecalciferol Take 1,000 Units by mouth daily.   doxepin 10 MG capsule Commonly known as: SINEQUAN Take 10 mg by mouth at bedtime.   DULoxetine 60 MG capsule Commonly known as: CYMBALTA Take 60 mg by mouth daily.   glimepiride 4 MG tablet Commonly known as: AMARYL Take 4 mg by mouth 2 (two) times daily.   hydrOXYzine 10 MG tablet Commonly known as: ATARAX/VISTARIL Take 10 mg by mouth 3 (three) times daily as needed for itching.   ibuprofen 200 MG tablet Commonly known as: ADVIL Take 400-800 mg by mouth every 6 (six) hours as needed for headache or mild pain.   losartan-hydrochlorothiazide 100-12.5 MG tablet Commonly known as: HYZAAR Take 1 tablet by mouth daily.   pantoprazole 40 MG tablet Commonly known as: PROTONIX Take 40 mg by mouth daily.   rosuvastatin 10 MG tablet Commonly known as: CRESTOR Take 10 mg by mouth daily.   traZODone 50 MG tablet Commonly known as: DESYREL Take 50 mg by mouth at bedtime.       Allergies:  Allergies  Allergen Reactions  . Beta Adrenergic Blockers Itching  . Topiramate  Nausea And Vomiting    Other reaction(s): Dizziness, Hallucination Other Reaction: violence  . Aspirin Other (See Comments)    Per pt's PCP due to spontaneous bleeding in his brain.  . Benadryl [Diphenhydramine] Hives  . Lisinopril  Itching    Other reaction(s): Other (See Comments) Leg pain  . Nabumetone Other (See Comments)    Reaction: unknown  . Valproic Acid Other (See Comments)    Reaction: Headache    Family History: Family History  Problem Relation Age of Onset  . Diabetes Mellitus II Other        Maternal aunt    Social History:  reports that he has never smoked. He has never used smokeless tobacco. He reports that he does not drink alcohol and does not use drugs.  ROS: Pertinent ROS in HPI  Physical Exam: BP (!) 157/85   Pulse 81   Ht 5\' 3"  (1.6 m)   Wt 172 lb (78 kg)   BMI 30.47 kg/m   Constitutional:  Well nourished. Alert and oriented, No acute distress. HEENT: [place] AT, mask in place.  Trachea midline, no masses. Cardiovascular: No clubbing, cyanosis, or edema. Respiratory: Normal respiratory effort, no increased work of breathing. GU: No CVA tenderness.  No bladder fullness or masses.  Patient with circumcised phallus.  Urethral meatus is patent.  No penile discharge. No penile lesions or rashes. Scrotum without lesions, cysts, rashes and/or edema.  Testicles are located scrotally bilaterally. No masses are appreciated in the testicles. Left and right epididymis are normal. Rectal: Patient with  normal sphincter tone. Anus and perineum without scarring or rashes. No rectal masses are appreciated. Prostate is approximately 45 grams, no nodules are appreciated. Seminal vesicles are normal. Neurologic: Grossly intact, no focal deficits, moving all 4 extremities. Psychiatric: Normal mood and affect.  Laboratory Data: Lab Results  Component Value Date   WBC 3.5 (L) 05/14/2019   HGB 15.8 03/19/2020   HCT 46.7 03/19/2020   MCV 92.7 05/14/2019   PLT 153 05/14/2019     Lab Results  Component Value Date   CREATININE 0.92 05/14/2019    Lab Results  Component Value Date   TESTOSTERONE 72 (L) 03/19/2020    Lab Results  Component Value Date   HGBA1C 6.9 (H) 05/13/2019       Component Value Date/Time   CHOL 185 05/13/2019 0506   HDL 30 (L) 05/13/2019 0506   CHOLHDL 6.2 05/13/2019 0506   VLDL 59 (H) 05/13/2019 0506   LDLCALC 96 05/13/2019 0506    Lab Results  Component Value Date   AST 41 05/12/2019   Lab Results  Component Value Date   ALT 39 05/12/2019    Urinalysis    Component Value Date/Time   COLORURINE YELLOW (A) 05/13/2019 0616   APPEARANCEUR CLEAR (A) 05/13/2019 0616   LABSPEC 1.021 05/13/2019 0616   PHURINE 6.0 05/13/2019 0616   GLUCOSEU NEGATIVE 05/13/2019 0616   HGBUR NEGATIVE 05/13/2019 0616   BILIRUBINUR NEGATIVE 05/13/2019 0616   KETONESUR NEGATIVE 05/13/2019 0616   PROTEINUR NEGATIVE 05/13/2019 0616   NITRITE NEGATIVE 05/13/2019 0616   LEUKOCYTESUR NEGATIVE 05/13/2019 0616   Component     Latest Ref Rng & Units 06/20/2019 03/19/2020  Prostate Specific Ag, Serum     0.0 - 4.0 ng/mL 0.9 1.1    I have reviewed the labs.   Pertinent Imaging: No recent urological imaging  Assessment & Plan:    1. Testosterone deficiency  Testosterone level not at therapeutic levels Schedule Testopel insertion RTC in one week for insertion   2. BPH with LUTS IPSS score is 7/1 Continue conservative management, avoiding bladder irritants and timed voiding's RTC in 6 months for IPSS, PSA, PVR and exam, as testosterone therapy can cause prostate  enlargement and worsen LUTS  3. Erectile dysfunction:    SHIM score is 5 He is not interested in treatment RTC in 6 months for SHIM score and exam, as testosterone therapy can affect erections   Return for Schedule Testopel .  These notes generated with voice recognition software. I apologize for typographical errors.  Zara Council, PA-C  [facility] [address]  [place], [place] [address] [phone]

## 2020-03-25 ENCOUNTER — Other Ambulatory Visit: Payer: Self-pay

## 2020-03-25 ENCOUNTER — Encounter: Payer: Self-pay | Admitting: Urology

## 2020-03-25 ENCOUNTER — Ambulatory Visit (INDEPENDENT_AMBULATORY_CARE_PROVIDER_SITE_OTHER): Payer: Medicare Other | Admitting: Urology

## 2020-03-25 VITALS — BP 157/85 | HR 81 | Ht 63.0 in | Wt 172.0 lb

## 2020-03-25 DIAGNOSIS — E349 Endocrine disorder, unspecified: Secondary | ICD-10-CM

## 2020-03-25 DIAGNOSIS — N401 Enlarged prostate with lower urinary tract symptoms: Secondary | ICD-10-CM | POA: Diagnosis not present

## 2020-03-25 DIAGNOSIS — N138 Other obstructive and reflux uropathy: Secondary | ICD-10-CM | POA: Diagnosis not present

## 2020-03-25 DIAGNOSIS — N529 Male erectile dysfunction, unspecified: Secondary | ICD-10-CM

## 2020-04-07 NOTE — Progress Notes (Signed)
This is a 69 -year-old male with hypogonadism and he is managed with Testopel. He presents today for Testopel insertion.  Patient is placed on the exam table in the right lateral jackknife position.  Identified upper outer quadrant of hip for insertion; prepped area with Betadine and injected 10 cc's of Lidocaine 1% to anesthetize superficially and distally along trocar tract.  Made 3 mm incision using 11 blade of scalpel; trocar with sharp ended stylet was inserted into subcutaneous tissue in line with femur. Sharp stylet was withdrawn and 6 pellets were placed into trocar well. Testopel pellets advanced into tissue using blunt ended stylet. Trocar removed and incision closed using 6 Steri-Strips. Cleansed area to remove Betadine and covered Steri-Strips with outer Band-Aid.  Careful inspection of insertion is done and patient informed of post procedure instructions.  Advised patient to apply ice to the site for 20-30 minutes every hour if needed.  Avoid hot tubes, swimming or full water immersion of the insertion site for 72 hours.  Bandage may be removed after one week.    Patient is advised to contact the office if experiencing drainage of the insertion site, excessive redness or swelling of the site, chills and/or fevers > 101.5, nausea or vomiting, dizziness or lightheadedness and excessive tenderness.  Avoid strenuous activity and heavy lifting for 72 hours.     He will return in one month for serum testosterone.

## 2020-04-08 ENCOUNTER — Other Ambulatory Visit: Payer: Self-pay

## 2020-04-08 ENCOUNTER — Ambulatory Visit (INDEPENDENT_AMBULATORY_CARE_PROVIDER_SITE_OTHER): Payer: Medicare Other | Admitting: Urology

## 2020-04-08 ENCOUNTER — Encounter: Payer: Self-pay | Admitting: Urology

## 2020-04-08 VITALS — BP 176/83 | HR 69 | Ht 63.0 in | Wt 172.0 lb

## 2020-04-08 DIAGNOSIS — E349 Endocrine disorder, unspecified: Secondary | ICD-10-CM

## 2020-04-08 DIAGNOSIS — E230 Hypopituitarism: Secondary | ICD-10-CM | POA: Diagnosis not present

## 2020-04-08 MED ORDER — TESTOSTERONE 75 MG IL PLLT
75.0000 mg | PELLET | Freq: Once | Status: AC
  Administered 2020-04-08: 75 mg

## 2020-05-05 ENCOUNTER — Other Ambulatory Visit: Payer: Self-pay | Admitting: Family Medicine

## 2020-05-05 DIAGNOSIS — E349 Endocrine disorder, unspecified: Secondary | ICD-10-CM

## 2020-05-08 ENCOUNTER — Other Ambulatory Visit: Payer: Self-pay

## 2020-05-08 ENCOUNTER — Other Ambulatory Visit: Payer: Medicare Other

## 2020-05-08 DIAGNOSIS — E349 Endocrine disorder, unspecified: Secondary | ICD-10-CM

## 2020-05-09 LAB — TESTOSTERONE: Testosterone: 628 ng/dL (ref 264–916)

## 2020-05-12 ENCOUNTER — Telehealth: Payer: Self-pay | Admitting: Family Medicine

## 2020-05-12 NOTE — Telephone Encounter (Signed)
LMOM for patient to call and schedule lab and Testopel appointments.

## 2020-05-12 NOTE — Telephone Encounter (Signed)
-----   Message from Harle Battiest, PA-C sent at 05/12/2020  8:00 AM EST ----- Please let Mr. Mccarry know that his testosterone is at a therapeutic level.  We need to check it again a week prior to his Testopel insertion in two months, which needs to be scheduled.

## 2020-07-12 ENCOUNTER — Other Ambulatory Visit: Payer: Self-pay

## 2020-07-12 ENCOUNTER — Emergency Department
Admission: EM | Admit: 2020-07-12 | Discharge: 2020-07-12 | Disposition: A | Discharge status: Home / Self Care | Payer: Medicare Other | Attending: Emergency Medicine | Admitting: Emergency Medicine

## 2020-07-12 ENCOUNTER — Emergency Department: Payer: Medicare Other

## 2020-07-12 DIAGNOSIS — N200 Calculus of kidney: Secondary | ICD-10-CM

## 2020-07-12 DIAGNOSIS — Z8673 Personal history of transient ischemic attack (TIA), and cerebral infarction without residual deficits: Secondary | ICD-10-CM | POA: Insufficient documentation

## 2020-07-12 DIAGNOSIS — Z79899 Other long term (current) drug therapy: Secondary | ICD-10-CM | POA: Diagnosis not present

## 2020-07-12 DIAGNOSIS — Z7984 Long term (current) use of oral hypoglycemic drugs: Secondary | ICD-10-CM | POA: Insufficient documentation

## 2020-07-12 DIAGNOSIS — K219 Gastro-esophageal reflux disease without esophagitis: Secondary | ICD-10-CM | POA: Insufficient documentation

## 2020-07-12 DIAGNOSIS — I10 Essential (primary) hypertension: Secondary | ICD-10-CM | POA: Insufficient documentation

## 2020-07-12 DIAGNOSIS — E119 Type 2 diabetes mellitus without complications: Secondary | ICD-10-CM | POA: Diagnosis not present

## 2020-07-12 DIAGNOSIS — R1031 Right lower quadrant pain: Secondary | ICD-10-CM | POA: Diagnosis present

## 2020-07-12 LAB — COMPREHENSIVE METABOLIC PANEL
ALT: 16 U/L (ref 0–44)
AST: 16 U/L (ref 15–41)
Albumin: 4.6 g/dL (ref 3.5–5.0)
Alkaline Phosphatase: 86 U/L (ref 38–126)
Anion gap: 11 (ref 5–15)
BUN: 22 mg/dL (ref 8–23)
CO2: 22 mmol/L (ref 22–32)
Calcium: 9.4 mg/dL (ref 8.9–10.3)
Chloride: 101 mmol/L (ref 98–111)
Creatinine, Ser: 0.97 mg/dL (ref 0.61–1.24)
GFR, Estimated: >60 mL/min (ref >60)
Glucose, Bld: 135 mg/dL — ABNORMAL HIGH (ref 70–99)
Potassium: 4.2 mmol/L (ref 3.5–5.1)
Sodium: 134 mmol/L — ABNORMAL LOW (ref 135–145)
Total Bilirubin: 1 mg/dL (ref 0.3–1.2)
Total Protein: 7.4 g/dL (ref 6.5–8.1)

## 2020-07-12 LAB — URINALYSIS, COMPLETE (UACMP) WITH MICROSCOPIC
Bacteria, UA: NONE SEEN
Bilirubin Urine: NEGATIVE
Glucose, UA: NEGATIVE mg/dL
Ketones, ur: 5 mg/dL — AB
Leukocytes,Ua: NEGATIVE
Nitrite: NEGATIVE
Protein, ur: 100 mg/dL — AB
RBC / HPF: >50 RBC/hpf — ABNORMAL HIGH (ref 0–5)
Specific Gravity, Urine: 1.024 (ref 1.005–1.030)
Squamous Epithelial / HPF: NONE SEEN (ref 0–5)
pH: 5 (ref 5.0–8.0)

## 2020-07-12 LAB — CBC
HCT: 47 % (ref 39.0–52.0)
Hemoglobin: 16.1 g/dL (ref 13.0–17.0)
MCH: 31.5 pg (ref 26.0–34.0)
MCHC: 34.3 g/dL (ref 30.0–36.0)
MCV: 92 fL (ref 80.0–100.0)
Platelets: 229 10*3/uL (ref 150–400)
RBC: 5.11 MIL/uL (ref 4.22–5.81)
RDW: 12.2 % (ref 11.5–15.5)
WBC: 8.3 10*3/uL (ref 4.0–10.5)
nRBC: 0 % (ref 0.0–0.2)

## 2020-07-12 LAB — LIPASE, BLOOD: Lipase: 28 U/L (ref 11–51)

## 2020-07-12 MED ORDER — CEPHALEXIN 500 MG PO CAPS: 500.0000 mg | ORAL_CAPSULE | Freq: Two times a day (BID) | ORAL | 0 refills | Status: DC

## 2020-07-12 MED ORDER — HYDROCODONE-ACETAMINOPHEN 5-325 MG PO TABS: 1.0000 | ORAL_TABLET | ORAL | 0 refills | Status: DC | PRN

## 2020-07-12 MED ORDER — CEPHALEXIN 500 MG PO CAPS
500.0000 mg | ORAL_CAPSULE | Freq: Once | ORAL | Status: AC
  Administered 2020-07-12: 500 mg via ORAL
  Filled 2020-07-12: qty 1

## 2020-07-12 NOTE — ED Triage Notes (Signed)
Pt via POV from home. Pt c/o RLQ abdominal pain that started 3 days ago. Pt states his urine has been pink-tinged all week and starting noticing blood clots in his urine today. Pt is A&Ox4 and NAD. Denies any abdominal surgeries. Pt is A&Ox4 and NAD.

## 2020-07-12 NOTE — ED Provider Notes (Signed)
[facility] Emergency Department Provider Note  Time seen: 7:57 PM  I have reviewed the triage vital signs and the nursing notes.   HISTORY  Chief Complaint Abdominal Pain and Hematuria   HPI KAYDYN SAYAS is a 70 y.o. male with a past medical history of diabetes, hypertension, prior kidney stone, presents to the emergency department for right flank pain.  According to the patient for the past 3 days he has been experiencing intermittent sharp pain to the right flank/right lower quadrant radiating into his right testicle.  Patient states he is also been experiencing hematuria with small clots in his urine today.  No dysuria.  No fever.  Largely negative review of systems otherwise.  Patient states he has had a kidney stone previously about 8 or 10 years ago but this feels somewhat different per patient.   Past Medical History:  Diagnosis Date  . Diabetes mellitus without complication ([place])   . Hypertension   . Pituitary tumor   . TIA (transient ischemic attack)     Patient Active Problem List   Diagnosis Date Noted  . CVA (cerebral vascular accident) ([place]) 05/13/2019  . Migraine 05/12/2019  . Depression 05/12/2019  . Hypertension   . Diabetes mellitus without complication ([place])   . Left arm weakness   . Hypogonadism in male 03/13/2019  . Pituitary adenoma ([place]) 03/13/2019  . History of stroke 11/29/2018  . Hx of adenomatous colonic polyps 11/29/2018  . Other nontraumatic intracerebral hemorrhage ([place]) 09/06/2018  . Occipital stroke ([place]) 08/19/2018  . 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 Left-sided weakness 06/02/2018  . HTN (hypertension) 06/02/2018  . Diabetes ([employer]) 06/02/2018  . GERD (gastroesophageal reflux disease) 06/02/2018  . Migraine without aura and without status migrainosus, not intractable 01/04/2018  . 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 Headache 07/30/2017  . 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  Chronic pain of left knee 05/05/2017  . 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 TIA (transient ischemic attack) 05/01/2017  . Pseudophakia of left eye 03/14/2017  . OSA (obstructive sleep apnea) 11/02/2016  . 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 Hyperlipidemia, mixed 04/29/2015  . 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 Rhytides 01/04/2013  . Gross hematuria 04/11/2012  . Nephrolithiasis 04/11/2012  . Heartburn 11/04/2010    Past Surgical History:  Procedure Laterality Date  . COLONOSCOPY    . EYE SURGERY    . HERNIA REPAIR      Prior to Admission medications   Medication Sig Start Date End Date Taking? Authorizing Provider  acetaminophen (TYLENOL) 500 MG tablet Take 1,500 mg by mouth every 6 (six) hours as needed for moderate pain.     [provider]  amLODipine (NORVASC) 5 MG tablet Take 5 mg by mouth daily.  09/06/18 09/06/19  [provider]  butalbital-acetaminophen-caffeine (FIORICET WITH CODEINE) 50-325-40-30 MG capsule Take 1 capsule by mouth every 4 (four) hours as needed for headache.    [provider]  carbidopa-levodopa (SINEMET IR) 25-100 MG tablet Take 1 tablet by mouth 3 (three) times daily. 05/09/19   [provider]  Cholecalciferol (D3-1000) 1000 units tablet Take 1,000 Units by mouth daily.    [provider]  doxepin (SINEQUAN) 10 MG capsule Take 10 mg by mouth at bedtime.  02/25/19   [provider]  DULoxetine (CYMBALTA) 60 MG capsule Take 60 mg by mouth daily.    [provider]  glimepiride (AMARYL) 4 MG tablet Take 4 mg by mouth 2 (two) times daily.    [provider]  hydrOXYzine (ATARAX/VISTARIL) 10 MG tablet Take 10 mg by mouth 3 (three) times daily as needed for itching.     [provider]  ibuprofen (ADVIL,MOTRIN) 200 MG tablet Take 400-800 mg by mouth every 6 (six) hours as needed for headache or  mild pain.     [provider]  losartan-hydrochlorothiazide (HYZAAR) 100-12.5 MG tablet Take 1 tablet by mouth daily.    [provider]  pantoprazole (PROTONIX) 40 MG tablet Take 40 mg by mouth daily. 06/27/17   [provider]  rosuvastatin (CRESTOR) 10 MG tablet Take 10 mg by mouth daily.  11/08/18 11/08/19  [provider]  traZODone (DESYREL) 50 MG tablet Take 50 mg by mouth at bedtime.     [provider]    Allergies  Allergen Reactions  . Beta Adrenergic Blockers Itching  . Topiramate Nausea And Vomiting    Other reaction(s): Dizziness, Hallucination Other Reaction: violence  . Aspirin Other (See Comments)    Per pt's PCP due to spontaneous bleeding in his brain.  . Benadryl [Diphenhydramine] Hives  . Lisinopril Itching    Other reaction(s): Other (See Comments) Leg pain  . Nabumetone Other (See Comments)    Reaction: unknown  . Valproic Acid Other (See Comments)    Reaction: Headache    Family History  Problem Relation Age of Onset  . Diabetes Mellitus II Other        Maternal aunt    Social History Social History   Tobacco Use  . Smoking status: Never Smoker  . Smokeless tobacco: Never Used  Vaping Use  . Vaping Use: Never used  Substance Use Topics  . Alcohol use: Never  . Drug use: Never    Review of Systems Constitutional: Negative for fever. Cardiovascular: Negative for chest pain. Respiratory: Negative for shortness of breath. Gastrointestinal: 6/10 right lower quadrant abdominal pain.  Sharp. Genitourinary: Hematuria. Musculoskeletal: Negative for musculoskeletal complaints Neurological: Negative for headache All other ROS negative  ____________________________________________   PHYSICAL EXAM:  VITAL SIGNS: ED Triage Vitals  Enc Vitals Group     BP 07/12/20 1734 (!) 181/97     Pulse Rate 07/12/20 1734 67     Resp 07/12/20 1734 20     Temp 07/12/20 1734 98.5 F (36.9 C)     Temp Source 07/12/20  1734 Oral     SpO2 07/12/20 1734 100 %     Weight 07/12/20 1735 180 lb (81.6 kg)     Height 07/12/20 1735 5\' 6"  (1.676 m)     Head Circumference --      Peak Flow --      Pain Score 07/12/20 1735 8     Pain Loc --      Pain Edu? --      Excl. in [place]? --    Constitutional: Alert and oriented. Well appearing and in no distress. Eyes: Normal exam ENT      Head: Normocephalic and atraumatic.      Mouth/Throat: Mucous membranes are moist. Cardiovascular: Normal rate, regular rhythm. Respiratory: Normal respiratory effort without tachypnea nor retractions. Breath sounds are clear Gastrointestinal: Soft, mild right lower quadrant tenderness palpation without rebound guarding or distention.  Abdomen otherwise benign. Musculoskeletal: Nontender with normal range of motion in all extremities. Neurologic:  Normal speech and language. No gross focal neurologic deficits  Skin:  Skin is warm,  dry and intact.  Psychiatric: Mood and affect are normal.   ____________________________________________    RADIOLOGY  CT shows obstructing 3 mm UVJ stone.  ____________________________________________   INITIAL IMPRESSION / ASSESSMENT AND PLAN / ED COURSE  Pertinent labs & imaging results that were available during my care of the patient were reviewed by me and considered in my medical decision making (see chart for details).   Patient presents emergency department for right lower quadrant/right flank pain described as sharp intermittent over the past 3 days now with hematuria.  Differential would include UTI, pyelonephritis, ureterolithiasis.  We will check labs and proceed with CT renal scan to further evaluate.  Patient's lab work is largely within normal limits, urinalysis does show greater than 50 red cells no bacteria.  Urine culture has been added as a precaution.  CT pending.  CT confirms 3 mm right UVJ stone.  Given the patient's equivocal urinalysis we will cover with antibiotics as a  precaution and send urine culture.  Will discharge with a short course of pain medication and urology follow-up.  Patient agreeable to plan of care.  Discussed my typical kidney stone return precautions.  Jc Veron Sannes was evaluated in Emergency Department on 07/12/2020 for the symptoms described in the history of present illness. He was evaluated in the context of the global COVID-19 pandemic, which necessitated consideration that the patient might be at risk for infection with the SARS-CoV-2 virus that causes COVID-19. Institutional protocols and algorithms that pertain to the evaluation of patients at risk for COVID-19 are in a state of rapid change based on information released by regulatory bodies including the CDC and federal and state organizations. These policies and algorithms were followed during the patient's care in the ED.  ____________________________________________   FINAL CLINICAL IMPRESSION(S) / ED DIAGNOSES  Right flank pain Kidney stone   Harvest Dark, MD 07/12/20 2119

## 2020-07-14 LAB — URINE CULTURE: Culture: <10000 — AB

## 2020-07-17 ENCOUNTER — Telehealth: Payer: Self-pay | Admitting: Urology

## 2020-07-17 NOTE — Telephone Encounter (Signed)
Left message to call back to schedule.

## 2020-07-17 NOTE — Telephone Encounter (Signed)
-----   Message from Abbie Sons, MD sent at 07/17/2020  9:08 AM EST ----- Regarding: Appointment Recently seen in ED for stone.  Please schedule follow-up appointment with me.  Needs KUB prior

## 2020-07-23 ENCOUNTER — Emergency Department
Admission: EM | Admit: 2020-07-23 | Discharge: 2020-07-23 | Disposition: A | Discharge status: Home / Self Care | Payer: Medicare Other | Attending: Emergency Medicine | Admitting: Emergency Medicine

## 2020-07-23 ENCOUNTER — Other Ambulatory Visit: Payer: Self-pay

## 2020-07-23 ENCOUNTER — Emergency Department: Payer: Medicare Other

## 2020-07-23 DIAGNOSIS — N200 Calculus of kidney: Secondary | ICD-10-CM | POA: Diagnosis not present

## 2020-07-23 DIAGNOSIS — E119 Type 2 diabetes mellitus without complications: Secondary | ICD-10-CM | POA: Insufficient documentation

## 2020-07-23 DIAGNOSIS — Z7984 Long term (current) use of oral hypoglycemic drugs: Secondary | ICD-10-CM | POA: Diagnosis not present

## 2020-07-23 DIAGNOSIS — Z79899 Other long term (current) drug therapy: Secondary | ICD-10-CM | POA: Insufficient documentation

## 2020-07-23 DIAGNOSIS — R1032 Left lower quadrant pain: Secondary | ICD-10-CM | POA: Diagnosis present

## 2020-07-23 DIAGNOSIS — I10 Essential (primary) hypertension: Secondary | ICD-10-CM | POA: Insufficient documentation

## 2020-07-23 LAB — URINALYSIS, ROUTINE W REFLEX MICROSCOPIC
Bacteria, UA: NONE SEEN
Bilirubin Urine: NEGATIVE
Glucose, UA: 50 mg/dL — AB
Ketones, ur: NEGATIVE mg/dL
Leukocytes,Ua: NEGATIVE
Nitrite: NEGATIVE
Protein, ur: 30 mg/dL — AB
RBC / HPF: >50 RBC/hpf — ABNORMAL HIGH (ref 0–5)
Specific Gravity, Urine: 1.041 — ABNORMAL HIGH (ref 1.005–1.030)
pH: 5 (ref 5.0–8.0)

## 2020-07-23 LAB — CBC WITH DIFFERENTIAL/PLATELET
Abs Immature Granulocytes: 0.04 10*3/uL (ref 0.00–0.07)
Basophils Absolute: 0 10*3/uL (ref 0.0–0.1)
Basophils Relative: 1 %
Eosinophils Absolute: 0.1 10*3/uL (ref 0.0–0.5)
Eosinophils Relative: 2 %
HCT: 44.3 % (ref 39.0–52.0)
Hemoglobin: 15.4 g/dL (ref 13.0–17.0)
Immature Granulocytes: 1 %
Lymphocytes Relative: 35 %
Lymphs Abs: 2.1 10*3/uL (ref 0.7–4.0)
MCH: 31.4 pg (ref 26.0–34.0)
MCHC: 34.8 g/dL (ref 30.0–36.0)
MCV: 90.2 fL (ref 80.0–100.0)
Monocytes Absolute: 0.5 10*3/uL (ref 0.1–1.0)
Monocytes Relative: 8 %
Neutro Abs: 3.3 10*3/uL (ref 1.7–7.7)
Neutrophils Relative %: 53 %
Platelets: 244 10*3/uL (ref 150–400)
RBC: 4.91 MIL/uL (ref 4.22–5.81)
RDW: 12.1 % (ref 11.5–15.5)
WBC: 6.1 10*3/uL (ref 4.0–10.5)
nRBC: 0 % (ref 0.0–0.2)

## 2020-07-23 LAB — BASIC METABOLIC PANEL
Anion gap: 10 (ref 5–15)
BUN: 16 mg/dL (ref 8–23)
CO2: 24 mmol/L (ref 22–32)
Calcium: 9.4 mg/dL (ref 8.9–10.3)
Chloride: 105 mmol/L (ref 98–111)
Creatinine, Ser: 1.12 mg/dL (ref 0.61–1.24)
GFR, Estimated: >60 mL/min (ref >60)
Glucose, Bld: 160 mg/dL — ABNORMAL HIGH (ref 70–99)
Potassium: 4.4 mmol/L (ref 3.5–5.1)
Sodium: 139 mmol/L (ref 135–145)

## 2020-07-23 LAB — HEPATIC FUNCTION PANEL
ALT: 17 U/L (ref 0–44)
AST: 24 U/L (ref 15–41)
Albumin: 4.6 g/dL (ref 3.5–5.0)
Alkaline Phosphatase: 77 U/L (ref 38–126)
Bilirubin, Direct: 0.2 mg/dL (ref 0.0–0.2)
Indirect Bilirubin: 0.6 mg/dL (ref 0.3–0.9)
Total Bilirubin: 0.8 mg/dL (ref 0.3–1.2)
Total Protein: 7.6 g/dL (ref 6.5–8.1)

## 2020-07-23 LAB — LIPASE, BLOOD: Lipase: 35 U/L (ref 11–51)

## 2020-07-23 MED ORDER — FENTANYL CITRATE (PF) 100 MCG/2ML IJ SOLN
50.0000 ug | Freq: Once | INTRAMUSCULAR | Status: AC
  Administered 2020-07-23: 50 ug via INTRAVENOUS
  Filled 2020-07-23: qty 2

## 2020-07-23 MED ORDER — ONDANSETRON HCL 4 MG/2ML IJ SOLN
4.0000 mg | Freq: Once | INTRAMUSCULAR | Status: AC
  Administered 2020-07-23: 4 mg via INTRAVENOUS
  Filled 2020-07-23: qty 2

## 2020-07-23 MED ORDER — ONDANSETRON 4 MG PO TBDP: 4.0000 mg | ORAL_TABLET | Freq: Four times a day (QID) | ORAL | 0 refills | Status: DC | PRN

## 2020-07-23 MED ORDER — HYDROCODONE-ACETAMINOPHEN 5-325 MG PO TABS: 2.0000 | ORAL_TABLET | Freq: Four times a day (QID) | ORAL | 0 refills | Status: DC | PRN

## 2020-07-23 MED ORDER — IOHEXOL 300 MG/ML  SOLN
100.0000 mL | Freq: Once | INTRAMUSCULAR | Status: AC | PRN
  Administered 2020-07-23: 100 mL via INTRAVENOUS

## 2020-07-23 MED ORDER — MORPHINE SULFATE (PF) 4 MG/ML IV SOLN
4.0000 mg | Freq: Once | INTRAVENOUS | Status: AC
  Administered 2020-07-23: 4 mg via INTRAVENOUS
  Filled 2020-07-23: qty 1

## 2020-07-23 MED ORDER — HYDROMORPHONE HCL 1 MG/ML IJ SOLN
1.0000 mg | Freq: Once | INTRAMUSCULAR | Status: AC
  Administered 2020-07-23: 1 mg via INTRAVENOUS
  Filled 2020-07-23: qty 1

## 2020-07-23 MED ORDER — SODIUM CHLORIDE 0.9 % IV SOLN: INTRAVENOUS | Status: DC

## 2020-07-23 MED ORDER — PANTOPRAZOLE SODIUM 40 MG IV SOLR
40.0000 mg | Freq: Once | INTRAVENOUS | Status: AC
  Administered 2020-07-23: 40 mg via INTRAVENOUS
  Filled 2020-07-23: qty 40

## 2020-07-23 MED ORDER — TAMSULOSIN HCL 0.4 MG PO CAPS: 0.4000 mg | ORAL_CAPSULE | Freq: Every day | ORAL | 0 refills | Status: DC

## 2020-07-23 NOTE — ED Notes (Signed)
This RN went to assess pt and administer medications. Pt noted to not be very talkative or descriptive when asked to describe the pain he was feeling.

## 2020-07-23 NOTE — ED Provider Notes (Signed)
[facility] Emergency Department Provider Note  ____________________________________________   Event Date/Time   First MD Initiated Contact with Patient 07/23/20 0259     (approximate)  I have reviewed the triage vital signs and the nursing notes.   HISTORY  Chief Complaint Abdominal Pain    HPI RAMESES OU is a 70 y.o. male with history of hypertension, diabetes, TIA, kidney stones who presents to the emergency department with complaints of left lower quadrant sharp, severe abdominal pain without radiation that started today.  States that he felt a little bit of pain earlier today and has worsened since about 1030 last night.  He denies any radiation of pain.  States it does not feel like his previous kidney stones.  He has had nausea and heartburn but no vomiting, diarrhea.  No chest pain or shortness of breath.  No dysuria or hematuria.  He was seen in the emergency department on 07/12/2020 and diagnosed with a right-sided 3 mm ureteral stone which he has passed.  At that time CT imaging did show a left 3 mm stone within the kidney.  Denies previous abdominal surgery.        Past Medical History:  Diagnosis Date  . Diabetes mellitus without complication ([name])   . Hypertension   . Pituitary tumor   . TIA (transient ischemic attack)     Patient Active Problem List   Diagnosis Date Noted  . CVA (cerebral vascular accident) ([name]) 05/13/2019  . Migraine 05/12/2019  . Depression 05/12/2019  . Hypertension   . Diabetes mellitus without complication ([name])   . Left arm weakness   . Hypogonadism in male 03/13/2019  . Pituitary adenoma ([name]) 03/13/2019  . History of stroke 11/29/2018  . Hx of adenomatous colonic polyps 11/29/2018  . Other nontraumatic intracerebral hemorrhage ([name]) 09/06/2018  . Occipital stroke ([name]) 08/19/2018  . 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 Left-sided weakness 06/02/2018  . HTN (hypertension) 06/02/2018  .  Diabetes ([name]) 06/02/2018  . GERD (gastroesophageal reflux disease) 06/02/2018  . Migraine without aura and without status migrainosus, not intractable 01/04/2018  . 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 Headache 07/30/2017  . 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 Chronic pain of left knee 05/05/2017  . 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 TIA (transient ischemic attack) 05/01/2017  . Pseudophakia of left eye 03/14/2017  . OSA (obstructive sleep apnea) 11/02/2016  . 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 Hyperlipidemia, mixed 04/29/2015  . 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 Rhytides 01/04/2013  . Gross hematuria 04/11/2012  . Nephrolithiasis 04/11/2012  . Heartburn 11/04/2010    Past Surgical History:  Procedure Laterality Date  . COLONOSCOPY    . EYE SURGERY    . HERNIA REPAIR      Prior to Admission medications   Medication Sig Start Date End Date Taking? Authorizing Provider  HYDROcodone-acetaminophen (NORCO/VICODIN) 5-325 MG tablet Take 2 tablets by mouth every 6 (six) hours as needed. 07/23/20  Yes Godson Pollan N, DO  ondansetron (ZOFRAN ODT) 4 MG disintegrating tablet Take 1 tablet (4 mg total) by mouth every 6 (six) hours as needed for nausea or vomiting. 07/23/20  Yes Jessice Madill, Delice Bison, DO  tamsulosin (FLOMAX) 0.4 MG CAPS capsule Take 1 capsule (0.4 mg total) by mouth daily. 07/23/20  Yes Clemmie Marxen, Delice Bison, DO  acetaminophen (TYLENOL) 500 MG tablet Take 1,500 mg by mouth every 6 (six)  hours as needed for moderate pain.     [provider]  amLODipine (NORVASC) 5 MG tablet Take 5 mg by mouth daily.  09/06/18 09/06/19  [provider]  butalbital-acetaminophen-caffeine (FIORICET WITH CODEINE) 50-325-40-30 MG capsule Take 1 capsule by mouth every 4 (four) hours as needed for headache.    [provider]  carbidopa-levodopa (SINEMET IR) 25-100 MG tablet Take  1 tablet by mouth 3 (three) times daily. 05/09/19   [provider]  cephALEXin (KEFLEX) 500 MG capsule Take 1 capsule (500 mg total) by mouth 2 (two) times daily. 07/12/20   Harvest Dark, MD  Cholecalciferol (D3-1000) 1000 units tablet Take 1,000 Units by mouth daily.    [provider]  doxepin (SINEQUAN) 10 MG capsule Take 10 mg by mouth at bedtime.  02/25/19   [provider]  DULoxetine (CYMBALTA) 60 MG capsule Take 60 mg by mouth daily.    [provider]  glimepiride (AMARYL) 4 MG tablet Take 4 mg by mouth 2 (two) times daily.    [provider]  hydrOXYzine (ATARAX/VISTARIL) 10 MG tablet Take 10 mg by mouth 3 (three) times daily as needed for itching.     [provider]  ibuprofen (ADVIL,MOTRIN) 200 MG tablet Take 400-800 mg by mouth every 6 (six) hours as needed for headache or mild pain.     [provider]  losartan-hydrochlorothiazide (HYZAAR) 100-12.5 MG tablet Take 1 tablet by mouth daily.    [provider]  pantoprazole (PROTONIX) 40 MG tablet Take 40 mg by mouth daily. 06/27/17   [provider]  rosuvastatin (CRESTOR) 10 MG tablet Take 10 mg by mouth daily.  11/08/18 11/08/19  [provider]  traZODone (DESYREL) 50 MG tablet Take 50 mg by mouth at bedtime.     [provider]    Allergies Beta adrenergic blockers, Topiramate, Aspirin, Benadryl [diphenhydramine], Lisinopril, Nabumetone, and Valproic acid  Family History  Problem Relation Age of Onset  . Diabetes Mellitus II Other        Maternal aunt    Social History Social History   Tobacco Use  . Smoking status: Never Smoker  . Smokeless tobacco: Never Used  Vaping Use  . Vaping Use: Never used  Substance Use Topics  . Alcohol use: Never  . Drug use: Never    Review of Systems Constitutional: No fever. Eyes: No visual changes. ENT: No sore throat. Cardiovascular: Denies chest pain. Respiratory: Denies  shortness of breath. Gastrointestinal: No vomiting, diarrhea. Genitourinary: Negative for dysuria. Musculoskeletal: Negative for back pain. Skin: Negative for rash. Neurological: Negative for focal weakness or numbness.  ____________________________________________   PHYSICAL EXAM:  VITAL SIGNS: ED Triage Vitals  Enc Vitals Group     BP 07/23/20 0257 (!) 166/90     Pulse Rate 07/23/20 0257 97     Resp 07/23/20 0257 20     Temp 07/23/20 0257 98.2 F (36.8 C)     Temp src --      SpO2 07/23/20 0257 99 %     Weight 07/23/20 0256 180 lb (81.6 kg)     Height 07/23/20 0256 5\' 6"  (1.676 m)     Head Circumference --      Peak Flow --      Pain Score 07/23/20 0255 10     Pain Loc --      Pain Edu? --      Excl. in [place]? --    CONSTITUTIONAL: Alert and oriented and responds  appropriately to questions.  Elderly, appears uncomfortable HEAD: Normocephalic EYES: Conjunctivae clear, pupils appear equal, EOM appear intact ENT: normal nose; moist mucous membranes NECK: Supple, normal ROM CARD: RRR; S1 and S2 appreciated; no murmurs, no clicks, no rubs, no gallops RESP: Normal chest excursion without splinting or tachypnea; breath sounds clear and equal bilaterally; no wheezes, no rhonchi, no rales, no hypoxia or respiratory distress, speaking full sentences ABD/GI: Normal bowel sounds; non-distended; soft, palpation of the left lower quadrant with voluntary guarding, no rebound BACK: The back appears normal EXT: Normal ROM in all joints; no deformity noted, no edema; no cyanosis SKIN: Normal color for age and race; warm; no rash on exposed skin NEURO: Moves all extremities equally PSYCH: The patient's mood and manner are appropriate.  ____________________________________________   LABS (all labs ordered are listed, but only abnormal results are displayed)  Labs Reviewed  BASIC METABOLIC PANEL - Abnormal; Notable for the following components:      Result Value   Glucose, Bld 160 (*)     All other components within normal limits  URINALYSIS, ROUTINE W REFLEX MICROSCOPIC - Abnormal; Notable for the following components:   Color, Urine YELLOW (*)    APPearance HAZY (*)    Specific Gravity, Urine 1.041 (*)    Glucose, UA 50 (*)    Hgb urine dipstick LARGE (*)    Protein, ur 30 (*)    RBC / HPF >50 (*)    All other components within normal limits  CBC WITH DIFFERENTIAL/PLATELET  HEPATIC FUNCTION PANEL  LIPASE, BLOOD   ____________________________________________  EKG  none ____________________________________________  RADIOLOGY I, Barret Esquivel, personally viewed and evaluated these images (plain radiographs) as part of my medical decision making, as well as reviewing the written report by the radiologist.  ED MD interpretation: 3 mm left UPJ stone.  Official radiology report(s): CT ABDOMEN PELVIS W CONTRAST  Result Date: 07/23/2020 CLINICAL DATA:  Flank pain, kidney stone suspected Diverticulitis suspected. Pt c/o LLQ pain that started tonight and woke him up from his sleep EXAM: CT ABDOMEN AND PELVIS WITH CONTRAST TECHNIQUE: Multidetector CT imaging of the abdomen and pelvis was performed using the standard protocol following bolus administration of intravenous contrast. CONTRAST:  121mL OMNIPAQUE IOHEXOL 300 MG/ML  SOLN COMPARISON:  CT renal 07/12/2020 FINDINGS: Lower chest: Bilateral lower lobe subsegmental atelectasis. Left anterior descending coronary artery calcification. Hepatobiliary: The hepatic parenchyma is diffusely hypodense compared to the splenic parenchyma consistent with fatty infiltration. No focal liver abnormality. The gallbladder is contracted. No gallstones, gallbladder wall thickening, or pericholecystic fluid. No biliary dilatation. Pancreas: No focal lesion. Normal pancreatic contour. No surrounding inflammatory changes. No main pancreatic ductal dilatation. Spleen: Nonspecific calcific density along the splenic parenchyma. Normal in size without  focal abnormality. Adrenals/Urinary Tract: No adrenal nodule bilaterally. Delayed left nephrogram. There is a 1.7 cm fluid density lesion within the right kidney likely represents a simple renal cyst. Similar finding within left kidney measuring up to 3.3 cm. There is a 3 mm calcified stone at the left ureteropelvic junction with associated mild left hydronephrosis. The ureters and distally is normal in caliber. No right nephroureterolithiasis. No right hydroureteronephrosis. The urinary bladder is unremarkable. Stomach/Bowel: Stomach is within normal limits. No evidence of bowel wall thickening or dilatation. Scattered colonic diverticulosis most prominent in the sigmoid colon. The majority of the large bowel is decompressed. Limited evaluation of the rectum. Appendix appears normal. Vascular/Lymphatic: No abdominal aorta or iliac aneurysm. Severe calcified and noncalcified atherosclerotic plaque of the  aorta and its branches. No abdominal, pelvic, or inguinal lymphadenopathy. Reproductive: Enlarged prostate gland measuring up to 5.5 cm. Other: No intraperitoneal free fluid. No intraperitoneal free gas. No organized fluid collection. Musculoskeletal: No abdominal wall hernia or abnormality. No suspicious lytic or blastic osseous lesions. No acute displaced fracture. Multilevel degenerative changes of the spine. Bilateral L5 pars interarticularis defects. IMPRESSION: 1. Obstructive 3 mm left ureteropelvic junction stone. 2. Hepatic steatosis. 3. Scattered colonic diverticulosis with no acute diverticulitis. 4. Prostatomegaly. 5.  Aortic Atherosclerosis (ICD10-I70.0). Electronically Signed   By: Iven Finn M.D.   On: 07/23/2020 04:24    ____________________________________________   PROCEDURES  Procedure(s) performed (including Critical Care):  Procedures  ____________________________________________   INITIAL IMPRESSION / ASSESSMENT AND PLAN / ED COURSE  As part of my medical decision making, I  reviewed the following data within the [place] History obtained from family, Nursing notes reviewed and incorporated, Labs reviewed , Old chart reviewed, Notes from prior ED visits and [place] Controlled Substance Database         Patient here with left-sided abdominal pain.  Differential includes kidney stone, UTI, pyelonephritis, diverticulitis, colitis, dissection, aortic aneurysm.  Labs pending.  Will obtain urinalysis, CT of the abdomen pelvis.  Will give IV fluids, pain and nausea medicine.  ED PROGRESS  Patient's labs unremarkable.  Urine shows large amount of red blood cells but no other sign of infection.  CT scan shows obstructive 3 mm left UPJ stone.  Pain slowly improving after Toradol, narcotic pain medication.  Anticipate discharge home with his wife with outpatient urologic follow-up.  Patient and wife comfortable with plan.  At this time, I do not feel there is any life-threatening condition present. I have reviewed, interpreted and discussed all results (EKG, imaging, lab, urine as appropriate) and exam findings with patient/family. I have reviewed nursing notes and appropriate previous records.  I feel the patient is safe to be discharged home without further emergent workup and can continue workup as an outpatient as needed. Discussed usual and customary return precautions. Patient/family verbalize understanding and are comfortable with this plan.  Outpatient follow-up has been provided as needed. All questions have been answered.  ____________________________________________   FINAL CLINICAL IMPRESSION(S) / ED DIAGNOSES  Final diagnoses:  Kidney stone on left side     ED Discharge Orders         Ordered    HYDROcodone-acetaminophen (NORCO/VICODIN) 5-325 MG tablet  Every 6 hours PRN        07/23/20 0715    ondansetron (ZOFRAN ODT) 4 MG disintegrating tablet  Every 6 hours PRN        07/23/20 0715    tamsulosin (FLOMAX) 0.4 MG CAPS capsule  Daily         07/23/20 0715          *Please note:  Denney Shein Felix was evaluated in Emergency Department on 07/23/2020 for the symptoms described in the history of present illness. He was evaluated in the context of the global COVID-19 pandemic, which necessitated consideration that the patient might be at risk for infection with the SARS-CoV-2 virus that causes COVID-19. Institutional protocols and algorithms that pertain to the evaluation of patients at risk for COVID-19 are in a state of rapid change based on information released by regulatory bodies including the CDC and federal and state organizations. These policies and algorithms were followed during the patient's care in the ED.  Some ED evaluations and interventions may be delayed as a  result of limited staffing during and the pandemic.*   Note:  This document was prepared using Dragon voice recognition software and may include unintentional dictation errors.   Yue Flanigan, Delice Bison, DO 07/23/20 [phone]

## 2020-07-23 NOTE — ED Triage Notes (Signed)
Pt to ED via EMS from home, pt states he is having LLQ pain that started tonight and woke him up from his sleep. Pt denies vomiting states he feels nauseous and had diarrhea this morning.

## 2020-07-25 ENCOUNTER — Other Ambulatory Visit: Payer: Self-pay | Admitting: *Deleted

## 2020-07-25 DIAGNOSIS — N200 Calculus of kidney: Secondary | ICD-10-CM

## 2020-07-25 NOTE — Telephone Encounter (Signed)
Notified patient as instructed, patient pleased. Discussed follow-up appointments, patient agrees  

## 2020-07-31 ENCOUNTER — Ambulatory Visit (INDEPENDENT_AMBULATORY_CARE_PROVIDER_SITE_OTHER): Payer: Medicare Other | Admitting: Urology

## 2020-07-31 ENCOUNTER — Encounter: Payer: Self-pay | Admitting: Urology

## 2020-07-31 ENCOUNTER — Other Ambulatory Visit: Payer: Self-pay

## 2020-07-31 VITALS — BP 110/70 | HR 68 | Ht 67.0 in | Wt 167.0 lb

## 2020-07-31 DIAGNOSIS — N200 Calculus of kidney: Secondary | ICD-10-CM

## 2020-07-31 DIAGNOSIS — E349 Endocrine disorder, unspecified: Secondary | ICD-10-CM

## 2020-07-31 NOTE — Progress Notes (Unsigned)
07/31/2020 10:54 AM   Jon [name] 1950-09-19 [id number]  Referring provider: Idelle Crouch, MD [facility] [place],  [place] [address]  Chief Complaint  Patient presents with  . Nephrolithiasis    HPI: 70 y.o. male presents for ED follow-up for episodes of renal colic   followed for hypogonadism and currently receiving Testopel with last implant by Zara Council November 2021  Presented to [facility] ED 07/12/2020 complaining of right renal colic and CT showed a 3 mm right UVJ stone and a 3 mm nonobstructing left renal calculus  He passed the calculus with resolution of his symptoms however required a visit to the ED on 07/23/2020 complaining of left lower quadrant abdominal pain  Repeat CT showed migration of the left renal calculus to the UPJ  He subsequently passed the left-sided stone with resolution of his pain  Passed 1 prior stone 4-5 years ago  Presently asymptomatic   PMH: Past Medical History:  Diagnosis Date  . Diabetes mellitus without complication ([place])   . Hypertension   . Pituitary tumor   . TIA (transient ischemic attack)     Surgical History: Past Surgical History:  Procedure Laterality Date  . COLONOSCOPY    . EYE SURGERY    . HERNIA REPAIR      Home Medications:  Allergies as of 07/31/2020      Reactions   Beta Adrenergic Blockers Itching   Topiramate Nausea And Vomiting   Other reaction(s): Dizziness, Hallucination Other Reaction: violence   Aspirin Other (See Comments)   Per pt's PCP due to spontaneous bleeding in his brain.   Benadryl [diphenhydramine] Hives   Lisinopril Itching   Other reaction(s): Other (See Comments) Leg pain   Nabumetone Other (See Comments)   Reaction: unknown   Valproic Acid Other (See Comments)   Reaction: Headache      Medication List       Accurate as of July 31, 2020 10:54 AM. If you have any questions, ask your nurse or doctor.        acetaminophen 500 MG  tablet Commonly known as: TYLENOL Take 1,500 mg by mouth every 6 (six) hours as needed for moderate pain.   amLODipine 5 MG tablet Commonly known as: NORVASC Take 5 mg by mouth daily.   butalbital-acetaminophen-caffeine 50-325-40-30 MG capsule Commonly known as: FIORICET WITH CODEINE Take 1 capsule by mouth every 4 (four) hours as needed for headache.   carbidopa-levodopa 25-100 MG tablet Commonly known as: SINEMET IR Take 1 tablet by mouth 3 (three) times daily.   cephALEXin 500 MG capsule Commonly known as: KEFLEX Take 1 capsule (500 mg total) by mouth 2 (two) times daily.   D3-1000 25 MCG (1000 UT) tablet Generic drug: Cholecalciferol Take 1,000 Units by mouth daily.   doxepin 10 MG capsule Commonly known as: SINEQUAN Take 10 mg by mouth at bedtime.   DULoxetine 60 MG capsule Commonly known as: CYMBALTA Take 60 mg by mouth daily.   glimepiride 4 MG tablet Commonly known as: AMARYL Take 4 mg by mouth 2 (two) times daily.   HYDROcodone-acetaminophen 5-325 MG tablet Commonly known as: NORCO/VICODIN Take 2 tablets by mouth every 6 (six) hours as needed.   hydrOXYzine 10 MG tablet Commonly known as: ATARAX/VISTARIL Take 10 mg by mouth 3 (three) times daily as needed for itching.   ibuprofen 200 MG tablet Commonly known as: ADVIL Take 400-800 mg by mouth every 6 (six) hours as needed for headache or mild  pain.   losartan-hydrochlorothiazide 100-12.5 MG tablet Commonly known as: HYZAAR Take 1 tablet by mouth daily.   ondansetron 4 MG disintegrating tablet Commonly known as: Zofran ODT Take 1 tablet (4 mg total) by mouth every 6 (six) hours as needed for nausea or vomiting.   pantoprazole 40 MG tablet Commonly known as: PROTONIX Take 40 mg by mouth daily.   rosuvastatin 10 MG tablet Commonly known as: CRESTOR Take 10 mg by mouth daily.   tamsulosin 0.4 MG Caps capsule Commonly known as: Flomax Take 1 capsule (0.4 mg total) by mouth daily.   traZODone 50  MG tablet Commonly known as: DESYREL Take 50 mg by mouth at bedtime.       Allergies:  Allergies  Allergen Reactions  . Beta Adrenergic Blockers Itching  . Topiramate Nausea And Vomiting    Other reaction(s): Dizziness, Hallucination Other Reaction: violence  . Aspirin Other (See Comments)    Per pt's PCP due to spontaneous bleeding in his brain.  . Benadryl [Diphenhydramine] Hives  . Lisinopril Itching    Other reaction(s): Other (See Comments) Leg pain  . Nabumetone Other (See Comments)    Reaction: unknown  . Valproic Acid Other (See Comments)    Reaction: Headache    Family History: Family History  Problem Relation Age of Onset  . Diabetes Mellitus II Other        Maternal aunt    Social History:  reports that he has never smoked. He has never used smokeless tobacco. He reports that he does not drink alcohol and does not use drugs.   Physical Exam: There were no vitals taken for this visit.  Constitutional:  Alert and oriented, No acute distress. HEENT: [place] AT, moist mucus membranes.  Trachea midline, no masses. Cardiovascular: No clubbing, cyanosis, or edema. Respiratory: Normal respiratory effort, no increased work of breathing. Skin: No rashes, bruises or suspicious lesions. Neurologic: Grossly intact, no focal deficits, moving all 4 extremities. Psychiatric: Normal mood and affect.   Pertinent Imaging: CT images from 07/12/2020 and 07/23/2020 were personally reviewed and interpreted  CT Renal Stone Study  Narrative CLINICAL DATA:  Flank pain, kidney stone suspected right sided. Home. Pt c/o RLQ abdominal pain that started 3 days ago. Pt states his urine has been pink-tinged all week and starting noticing blood clots in his urine today.  EXAM: CT ABDOMEN AND PELVIS WITHOUT CONTRAST  TECHNIQUE: Multidetector CT imaging of the abdomen and pelvis was performed following the standard protocol without IV contrast.  COMPARISON:  CT abdomen pelvis  12/11/2006  FINDINGS: Lower chest: No acute abnormality.  Hepatobiliary: No focal liver abnormality. No gallstones, gallbladder wall thickening, or pericholecystic fluid. No biliary dilatation.  Pancreas: No focal lesion. Normal pancreatic contour. No surrounding inflammatory changes. No main pancreatic ductal dilatation.  Spleen: Normal in size without focal abnormality.  Adrenals/Urinary Tract:  No adrenal nodule bilaterally.  Bilateral nonspecific perinephric stranding. A 3 mm calcified stone within the left kidney. No right nephrolithiasis. There is a 3.2 cm fluid density lesion within the right kidney likely represents a simple renal cyst. Mild fullness of the right collecting system with no frank hydronephrosis. No left hydronephrosis. No contour-deforming renal mass bilaterally. There is mild right hydroureter with an associated 3 mm right ureterovesicular junction stone. No left ureterolithiasis or hydroureter. The urinary bladder is unremarkable.  Stomach/Bowel: Stomach is within normal limits. No evidence of bowel wall thickening or dilatation. Diffuse colonic diverticulosis. Appendix appears normal.  Vascular/Lymphatic: No abdominal aorta or iliac aneurysm.  Severe atherosclerotic plaque of the aorta and its branches. No abdominal, pelvic, or inguinal lymphadenopathy.  Reproductive: The prostate is enlarged measuring up to 5.5 cm.  Other: No intraperitoneal free fluid. No intraperitoneal free gas. No organized fluid collection.  Musculoskeletal:  Tiny fat containing umbilical hernia.  No suspicious lytic or blastic osseous lesions. No acute displaced fracture. Multilevel degenerative changes of the spine.  IMPRESSION: 1. Obstructive right 3 mm ureterovesicular junction stone. Recommend correlation with urinalysis to exclude superimposed infection. 2. Nonobstructive 3 mm left nephrolithiasis. 3. Diffuse colonic diverticulosis with no acute  diverticulitis. 4.  Aortic Atherosclerosis (ICD10-I70.0).   Electronically Signed By: Iven Finn M.D. On: 07/12/2020 20:54   Assessment & Plan:    1.  Recurrent stone disease  2 separate episodes of renal colic with passed ureteral calculi  CT shows no remaining calculi  One previous stone 4-5 years ago  Metabolic evaluation was discussed for recurrent stone formation versus general stone prevention guidelines he has declined metabolic evaluation at this time and was provided literature on stone prevention  KUB 1 year  2.  Hypogonadism  Testosterone level today and if low schedule Testopel insertion with Larene Beach if he desires to continue [place], MD  [facility] [address] [place], [place] [address] [phone]

## 2020-08-01 ENCOUNTER — Encounter: Payer: Self-pay | Admitting: Urology

## 2020-08-01 LAB — TESTOSTERONE: Testosterone: 215 ng/dL — ABNORMAL LOW (ref 264–916)

## 2020-08-04 ENCOUNTER — Telehealth: Payer: Self-pay | Admitting: *Deleted

## 2020-08-04 NOTE — Telephone Encounter (Signed)
Notified patient as instructed, patient states he would not like to continued to do the testopel. He would like to try something else. No shorts per patient.

## 2020-08-04 NOTE — Telephone Encounter (Signed)
-----   Message from Abbie Sons, MD sent at 08/01/2020  2:22 PM EDT ----- Testosterone level low at 215.  Can schedule Testopel with Larene Beach if he desires to continue this form of testosterone replacement

## 2020-08-05 ENCOUNTER — Encounter: Payer: Self-pay | Admitting: *Deleted

## 2020-08-05 MED ORDER — TESTOSTERONE 20.25 MG/ACT (1.62%) TD GEL: TRANSDERMAL | 1 refills | Status: DC

## 2020-08-05 NOTE — Telephone Encounter (Signed)
Rx AndroGel sent to pharmacy.  Needs a follow-up appointment with Larene Beach in ~ 2 months with testosterone level prior

## 2020-08-05 NOTE — Addendum Note (Signed)
Addended by: Abbie Sons on: 08/05/2020 07:24 AM   Modules accepted: Orders

## 2020-08-13 ENCOUNTER — Encounter: Payer: Self-pay | Admitting: Urology

## 2020-08-13 ENCOUNTER — Other Ambulatory Visit: Payer: Self-pay | Admitting: Urology

## 2020-08-13 MED ORDER — TRAMADOL HCL 50 MG PO TABS: 50.0000 mg | ORAL_TABLET | Freq: Four times a day (QID) | ORAL | 0 refills | Status: DC | PRN

## 2020-08-13 MED ORDER — TAMSULOSIN HCL 0.4 MG PO CAPS: 0.4000 mg | ORAL_CAPSULE | Freq: Every day | ORAL | 0 refills | Status: DC

## 2020-08-26 ENCOUNTER — Other Ambulatory Visit: Payer: Self-pay | Admitting: *Deleted

## 2020-08-26 DIAGNOSIS — E349 Endocrine disorder, unspecified: Secondary | ICD-10-CM

## 2020-10-03 ENCOUNTER — Other Ambulatory Visit: Payer: Medicare Other

## 2020-10-06 NOTE — Progress Notes (Signed)
10/07/2020 9:29 AM   Jon [name] 11-Mar-1951 [id number]  Referring provider: Idelle Crouch, MD [facility] [place],   [id number]  Chief Complaint  Patient presents with  . Hypogonadism   Urological history: 1.  Nephrolithiasis -Spontaneous passage of a 3 mm left UVJ stone in March 2022  2. BPH with LU TS -PSA 1.1 in 03/2020  -prostate volume ~ 60 cc on 07/2020 CT -I PSS 5/1 -managed with tamsulosin 0.4 mg daily   3. Testosterone deficiency -testosterone level 215 in 07/2020 -managed with Testopel -last insert 03/2020  4. 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 Sleep apnea -does not use CPAP machine - "he can't exhale" - hasn't used in 3 years   HPI: Jon [name] is a 70 y.o. who presents today to restart testosterone therapy.  He has not urinary complaints.  Patient denies any modifying or aggravating factors.  Patient denies any gross hematuria, dysuria or suprapubic/flank pain.  Patient denies any fevers, chills, nausea or vomiting.   UA negative.    IPSS    Row Name 10/07/20 0900         International Prostate Symptom Score   How often have you had the sensation of not emptying your bladder? Not at All     How often have you had to urinate less than every two hours? Less than 1 in 5 times     How often have you found you stopped and started again several times when you urinated? Less than 1 in 5 times     How often have you found it difficult to postpone urination? Less than 1 in 5 times     How often have you had a weak urinary stream? Less than 1 in 5 times     How often have you had to strain to start urination? Not at All     How many times did you typically get up at night to urinate? 1 Time     Total IPSS Score 5           Quality of Life due to urinary symptoms   If you were to spend the rest of your life with your urinary condition just the  way it is now how would you feel about that? Pleased            Score:  1-7 Mild 8-19 Moderate 20-35 Severe  PMH: Past Medical History:  Diagnosis Date  . Diabetes mellitus without complication ([place])   . Hypertension   . Pituitary tumor   . TIA (transient ischemic attack)     Surgical History: Past Surgical History:  Procedure Laterality Date  . COLONOSCOPY    . EYE SURGERY    . HERNIA REPAIR      Home Medications:  Allergies as of 10/07/2020      Reactions   Beta Adrenergic Blockers Itching   Topiramate Nausea And Vomiting   Other reaction(s): Dizziness, Hallucination Other Reaction: violence   Aspirin Other (See Comments)   Per pt's PCP due to spontaneous bleeding in his brain.   Benadryl [diphenhydramine] Hives   Lisinopril Itching   Other reaction(s): Other (See Comments) Leg pain   Nabumetone Other (See Comments)   Reaction: unknown   Valproic Acid Other (See Comments)   Reaction: Headache      Medication List       Accurate as of  Oct 07, 2020  9:29 AM. If you have any questions, ask your nurse or doctor.        acetaminophen 500 MG tablet Commonly known as: TYLENOL Take 1,500 mg by mouth every 6 (six) hours as needed for moderate pain.   amLODipine 5 MG tablet Commonly known as: NORVASC Take 5 mg by mouth daily.   butalbital-acetaminophen-caffeine 50-325-40-30 MG capsule Commonly known as: FIORICET WITH CODEINE Take 1 capsule by mouth every 4 (four) hours as needed for headache.   carbidopa-levodopa 25-100 MG tablet Commonly known as: SINEMET IR Take 1 tablet by mouth 3 (three) times daily.   cephALEXin 500 MG capsule Commonly known as: KEFLEX Take 1 capsule (500 mg total) by mouth 2 (two) times daily.   Cholecalciferol 25 MCG (1000 UT) tablet Take 1,000 Units by mouth daily.   doxepin 10 MG capsule Commonly known as: SINEQUAN Take 10 mg by mouth at bedtime.   DULoxetine 60 MG capsule Commonly known as: CYMBALTA Take 60 mg by  mouth daily.   glimepiride 4 MG tablet Commonly known as: AMARYL Take 4 mg by mouth 2 (two) times daily.   HYDROcodone-acetaminophen 5-325 MG tablet Commonly known as: NORCO/VICODIN Take 2 tablets by mouth every 6 (six) hours as needed.   hydrOXYzine 10 MG tablet Commonly known as: ATARAX/VISTARIL Take 10 mg by mouth 3 (three) times daily as needed for itching.   ibuprofen 200 MG tablet Commonly known as: ADVIL Take 400-800 mg by mouth every 6 (six) hours as needed for headache or mild pain.   losartan-hydrochlorothiazide 100-12.5 MG tablet Commonly known as: HYZAAR Take 1 tablet by mouth daily.   ondansetron 4 MG disintegrating tablet Commonly known as: Zofran ODT Take 1 tablet (4 mg total) by mouth every 6 (six) hours as needed for nausea or vomiting.   pantoprazole 40 MG tablet Commonly known as: PROTONIX Take 40 mg by mouth daily.   rosuvastatin 10 MG tablet Commonly known as: CRESTOR Take 10 mg by mouth daily.   tamsulosin 0.4 MG Caps capsule Commonly known as: Flomax Take 1 capsule (0.4 mg total) by mouth daily.   Testosterone 20.25 MG/ACT (1.62%) Gel Apply 1 pump to each shoulder daily   traMADol 50 MG tablet Commonly known as: ULTRAM Take 1 tablet (50 mg total) by mouth every 6 (six) hours as needed for moderate pain.   traZODone 50 MG tablet Commonly known as: DESYREL Take 50 mg by mouth at bedtime.       Allergies:  Allergies  Allergen Reactions  . Beta Adrenergic Blockers Itching  . Topiramate Nausea And Vomiting    Other reaction(s): Dizziness, Hallucination Other Reaction: violence  . Aspirin Other (See Comments)    Per pt's PCP due to spontaneous bleeding in his brain.  . Benadryl [Diphenhydramine] Hives  . Lisinopril Itching    Other reaction(s): Other (See Comments) Leg pain  . Nabumetone Other (See Comments)    Reaction: unknown  . Valproic Acid Other (See Comments)    Reaction: Headache    Family History: Family History   Problem Relation Age of Onset  . Diabetes Mellitus II Other        Maternal aunt    Social History:  reports that he has never smoked. He has never used smokeless tobacco. He reports that he does not drink alcohol and does not use drugs.  ROS: Pertinent ROS in HPI  Physical Exam: BP (!) 207/99   Pulse (!) 56   Ht 5\' 7"  (1.702 m)  Wt 166 lb (75.3 kg)   BMI 26.00 kg/m   Constitutional:  Well nourished. Alert and oriented, No acute distress. HEENT: [place] AT, mask in place.  Trachea midline Cardiovascular: No clubbing, cyanosis, or edema. Respiratory: Normal respiratory effort, no increased work of breathing. GU: No CVA tenderness.  No bladder fullness or masses.  Patient with circumcised phallus.  Urethral meatus is patent.  No penile discharge. No penile lesions or rashes. Scrotum without lesions, cysts, rashes and/or edema.  Testicles are located scrotally bilaterally.  They are atrophic.  No masses are appreciated in the testicles. Left and right epididymis are normal. Rectal: Patient with  normal sphincter tone. Anus and perineum without scarring or rashes. No rectal masses are appreciated. Prostate is approximately 45 grams, no nodules are appreciated. Seminal vesicles could not be palpated.  Neurologic: Grossly intact, no focal deficits, moving all 4 extremities. Psychiatric: Normal mood and affect.  Laboratory Data: Lab Results  Component Value Date   WBC 6.1 07/23/2020   HGB 15.4 07/23/2020   HCT 44.3 07/23/2020   MCV 90.2 07/23/2020   PLT 244 07/23/2020    Lab Results  Component Value Date   CREATININE 1.12 07/23/2020    Lab Results  Component Value Date   TESTOSTERONE 215 (L) 07/31/2020    Lab Results  Component Value Date   HGBA1C 6.9 (H) 05/13/2019       Component Value Date/Time   CHOL 185 05/13/2019 0506   HDL 30 (L) 05/13/2019 0506   CHOLHDL 6.2 05/13/2019 0506   VLDL 59 (H) 05/13/2019 0506   LDLCALC 96 05/13/2019 0506    Lab Results  Component  Value Date   AST 24 07/23/2020   Lab Results  Component Value Date   ALT 17 07/23/2020    Urinalysis Component     Latest Ref Rng & Units 10/07/2020  Specific Gravity, UA     1.005 - 1.030 >1.030 (H)  pH, UA     5.0 - 7.5 5.0  Color, UA     Yellow Yellow  Appearance Ur     Clear Clear  Leukocytes,UA     Negative Negative  Protein,UA     Negative/Trace Negative  Glucose, UA     Negative Trace (A)  Ketones, UA     Negative Negative  RBC, UA     Negative Negative  Bilirubin, UA     Negative Negative  Urobilinogen, Ur     0.2 - 1.0 mg/dL 0.2  Nitrite, UA     Negative Negative  Microscopic Examination      See below:   Component     Latest Ref Rng & Units 10/07/2020  WBC, UA     0 - 5 /hpf 0-5  RBC     0 - 2 /hpf 0-2  Epithelial Cells (non renal)     0 - 10 /hpf 0-10  Bacteria, UA     None seen/Few None seen  I have reviewed the labs.   Pertinent Imaging: No recent imaging  Assessment & Plan:    1. Testosterone deficiency  -patient states his insurance has denied testosterone therapy -we will resubmit the claim  2. BPH with LU TS -PSA pending  -no bothersome symptoms  3. Nephrolithiasis -declined metabolic work up -KUB in 09/6977    Return for pending insurance coverage.  These notes generated with voice recognition software. I apologize for typographical errors.  Zara Council, [facility] [address]  [address]  [place], [place] [address] [phone]

## 2020-10-07 ENCOUNTER — Other Ambulatory Visit: Payer: Self-pay

## 2020-10-07 ENCOUNTER — Ambulatory Visit (INDEPENDENT_AMBULATORY_CARE_PROVIDER_SITE_OTHER): Payer: Medicare Other | Admitting: Urology

## 2020-10-07 ENCOUNTER — Encounter: Payer: Self-pay | Admitting: Urology

## 2020-10-07 VITALS — BP 207/99 | HR 56 | Ht 67.0 in | Wt 166.0 lb

## 2020-10-07 DIAGNOSIS — N200 Calculus of kidney: Secondary | ICD-10-CM | POA: Diagnosis not present

## 2020-10-07 DIAGNOSIS — E349 Endocrine disorder, unspecified: Secondary | ICD-10-CM | POA: Diagnosis not present

## 2020-10-07 DIAGNOSIS — N138 Other obstructive and reflux uropathy: Secondary | ICD-10-CM

## 2020-10-07 DIAGNOSIS — N401 Enlarged prostate with lower urinary tract symptoms: Secondary | ICD-10-CM | POA: Diagnosis not present

## 2020-10-08 LAB — URINALYSIS, COMPLETE
Bilirubin, UA: NEGATIVE
Ketones, UA: NEGATIVE
Leukocytes,UA: NEGATIVE
Nitrite, UA: NEGATIVE
Protein,UA: NEGATIVE
RBC, UA: NEGATIVE
Specific Gravity, UA: >1.03 — ABNORMAL HIGH (ref 1.005–1.030)
Urobilinogen, Ur: 0.2 mg/dL (ref 0.2–1.0)
pH, UA: 5 (ref 5.0–7.5)

## 2020-10-08 LAB — PSA: Prostate Specific Ag, Serum: 0.6 ng/mL (ref 0.0–4.0)

## 2020-10-08 LAB — MICROSCOPIC EXAMINATION: Bacteria, UA: NONE SEEN

## 2020-10-23 NOTE — Progress Notes (Signed)
This is a 70 year old male with hypogonadism and he is managed with Testopel. He presents today for Testopel insertion.  Patient is placed on the exam table.  Identified upper outer quadrant of right hip for insertion; prepped area with Betadine and injected 9 cc's of Lidocaine 1% with Epinephrine to anesthetize superficially and distally along trocar tract.  Made 3 mm incision using 11 blade of scalpel; trocar with sharp ended stylet was inserted into subcutaneous tissue in line with femur. Sharp stylet was withdrawn and 6 pellets were placed into trocar well. Testopel pellets advanced into tissue using blunt ended stylet. Trocar removed and incision closed using 6 Steri-Strips. Cleansed area to remove Betadine and covered Steri-Strips with outer Band-Aid.  Careful inspection of insertion is done and patient informed of post procedure instructions.  Advised patient to apply ice to the site for 20-30 minutes every hour if needed.  Avoid hot tubes, swimming or full water immersion of the insertion site for 72 hours.  Bandage may be removed after one week.    Patient is advised to contact the office if experiencing drainage of the insertion site, excessive redness or swelling of the site, chills and/or fevers > 101.5, nausea or vomiting, dizziness or lightheadedness and excessive tenderness.  Avoid strenuous activity and heavy lifting for 72 hours.     He will return in one month for serum testosterone.

## 2020-10-24 ENCOUNTER — Encounter: Payer: Self-pay | Admitting: Urology

## 2020-10-24 ENCOUNTER — Other Ambulatory Visit: Payer: Self-pay

## 2020-10-24 ENCOUNTER — Ambulatory Visit (INDEPENDENT_AMBULATORY_CARE_PROVIDER_SITE_OTHER): Payer: Medicare Other | Admitting: Urology

## 2020-10-24 VITALS — BP 165/90 | HR 66 | Ht 66.0 in | Wt 164.0 lb

## 2020-10-24 DIAGNOSIS — E349 Endocrine disorder, unspecified: Secondary | ICD-10-CM

## 2020-10-24 DIAGNOSIS — E291 Testicular hypofunction: Secondary | ICD-10-CM

## 2020-10-24 MED ORDER — TESTOSTERONE 75 MG IL PLLT
75.0000 mg | PELLET | Freq: Once | Status: AC
  Administered 2020-10-24: 75 mg

## 2020-11-24 ENCOUNTER — Other Ambulatory Visit: Payer: Self-pay

## 2020-11-24 ENCOUNTER — Other Ambulatory Visit: Payer: Medicare Other

## 2020-11-24 DIAGNOSIS — E349 Endocrine disorder, unspecified: Secondary | ICD-10-CM

## 2020-11-25 LAB — TESTOSTERONE: Testosterone: 286 ng/dL (ref 264–916)

## 2020-12-06 ENCOUNTER — Emergency Department: Payer: Medicare Other

## 2020-12-06 ENCOUNTER — Other Ambulatory Visit: Payer: Self-pay

## 2020-12-06 ENCOUNTER — Emergency Department
Admission: EM | Admit: 2020-12-06 | Discharge: 2020-12-06 | Disposition: A | Discharge status: Home / Self Care | Payer: Medicare Other | Attending: Emergency Medicine | Admitting: Emergency Medicine

## 2020-12-06 DIAGNOSIS — T782XXA Anaphylactic shock, unspecified, initial encounter: Secondary | ICD-10-CM | POA: Insufficient documentation

## 2020-12-06 DIAGNOSIS — I10 Essential (primary) hypertension: Secondary | ICD-10-CM | POA: Insufficient documentation

## 2020-12-06 DIAGNOSIS — E119 Type 2 diabetes mellitus without complications: Secondary | ICD-10-CM | POA: Insufficient documentation

## 2020-12-06 DIAGNOSIS — R55 Syncope and collapse: Secondary | ICD-10-CM | POA: Diagnosis present

## 2020-12-06 DIAGNOSIS — Z79899 Other long term (current) drug therapy: Secondary | ICD-10-CM | POA: Insufficient documentation

## 2020-12-06 DIAGNOSIS — Z20822 Contact with and (suspected) exposure to covid-19: Secondary | ICD-10-CM | POA: Insufficient documentation

## 2020-12-06 DIAGNOSIS — E860 Dehydration: Secondary | ICD-10-CM | POA: Diagnosis not present

## 2020-12-06 LAB — CBC WITH DIFFERENTIAL/PLATELET
Abs Immature Granulocytes: 0.03 10*3/uL (ref 0.00–0.07)
Basophils Absolute: 0 10*3/uL (ref 0.0–0.1)
Basophils Relative: 0 %
Eosinophils Absolute: 0 10*3/uL (ref 0.0–0.5)
Eosinophils Relative: 1 %
HCT: 43.7 % (ref 39.0–52.0)
Hemoglobin: 15.6 g/dL (ref 13.0–17.0)
Immature Granulocytes: 0 %
Lymphocytes Relative: 22 %
Lymphs Abs: 1.6 10*3/uL (ref 0.7–4.0)
MCH: 32.9 pg (ref 26.0–34.0)
MCHC: 35.7 g/dL (ref 30.0–36.0)
MCV: 92.2 fL (ref 80.0–100.0)
Monocytes Absolute: 0.5 10*3/uL (ref 0.1–1.0)
Monocytes Relative: 7 %
Neutro Abs: 5.1 10*3/uL (ref 1.7–7.7)
Neutrophils Relative %: 70 %
Platelets: 220 10*3/uL (ref 150–400)
RBC: 4.74 MIL/uL (ref 4.22–5.81)
RDW: 12.5 % (ref 11.5–15.5)
WBC: 7.3 10*3/uL (ref 4.0–10.5)
nRBC: 0 % (ref 0.0–0.2)

## 2020-12-06 LAB — RESP PANEL BY RT-PCR (FLU A&B, COVID) ARPGX2
Influenza A by PCR: NEGATIVE
Influenza B by PCR: NEGATIVE
SARS Coronavirus 2 by RT PCR: NEGATIVE

## 2020-12-06 LAB — URINALYSIS, COMPLETE (UACMP) WITH MICROSCOPIC
Bacteria, UA: NONE SEEN
Bilirubin Urine: NEGATIVE
Glucose, UA: NEGATIVE mg/dL
Hgb urine dipstick: NEGATIVE
Ketones, ur: 20 mg/dL — AB
Leukocytes,Ua: NEGATIVE
Nitrite: NEGATIVE
Protein, ur: NEGATIVE mg/dL
Specific Gravity, Urine: 1.01 (ref 1.005–1.030)
Squamous Epithelial / HPF: NONE SEEN (ref 0–5)
pH: 7 (ref 5.0–8.0)

## 2020-12-06 LAB — COMPREHENSIVE METABOLIC PANEL
ALT: 19 U/L (ref 0–44)
AST: 28 U/L (ref 15–41)
Albumin: 4.7 g/dL (ref 3.5–5.0)
Alkaline Phosphatase: 74 U/L (ref 38–126)
Anion gap: 13 (ref 5–15)
BUN: 18 mg/dL (ref 8–23)
CO2: 21 mmol/L — ABNORMAL LOW (ref 22–32)
Calcium: 9.6 mg/dL (ref 8.9–10.3)
Chloride: 104 mmol/L (ref 98–111)
Creatinine, Ser: 1.06 mg/dL (ref 0.61–1.24)
GFR, Estimated: >60 mL/min (ref >60)
Glucose, Bld: 150 mg/dL — ABNORMAL HIGH (ref 70–99)
Potassium: 3.9 mmol/L (ref 3.5–5.1)
Sodium: 138 mmol/L (ref 135–145)
Total Bilirubin: 1.4 mg/dL — ABNORMAL HIGH (ref 0.3–1.2)
Total Protein: 7.6 g/dL (ref 6.5–8.1)

## 2020-12-06 LAB — PROCALCITONIN: Procalcitonin: <0.1 ng/mL

## 2020-12-06 LAB — LACTIC ACID, PLASMA
Lactic Acid, Venous: 2.8 mmol/L (ref 0.5–1.9)
Lactic Acid, Venous: 2.2 mmol/L (ref 0.5–1.9)

## 2020-12-06 LAB — TROPONIN I (HIGH SENSITIVITY)
Troponin I (High Sensitivity): 9 ng/L (ref <18)
Troponin I (High Sensitivity): 13 ng/L (ref <18)

## 2020-12-06 LAB — MAGNESIUM: Magnesium: 2.4 mg/dL (ref 1.7–2.4)

## 2020-12-06 LAB — CK: Total CK: 196 U/L (ref 49–397)

## 2020-12-06 MED ORDER — EPINEPHRINE 0.3 MG/0.3ML IJ SOAJ: 0.3000 mg | INTRAMUSCULAR | 0 refills | Status: DC | PRN

## 2020-12-06 MED ORDER — FAMOTIDINE IN NACL 20-0.9 MG/50ML-% IV SOLN
20.0000 mg | Freq: Once | INTRAVENOUS | Status: AC
Start: 2020-12-06 — End: 2020-12-06
  Administered 2020-12-06: 20 mg via INTRAVENOUS
  Filled 2020-12-06: qty 50

## 2020-12-06 MED ORDER — LACTATED RINGERS IV BOLUS
500.0000 mL | Freq: Once | INTRAVENOUS | Status: AC
  Administered 2020-12-06: 500 mL via INTRAVENOUS

## 2020-12-06 MED ORDER — CETIRIZINE HCL 5 MG/5ML PO SOLN
5.0000 mg | Freq: Once | ORAL | Status: AC
  Administered 2020-12-06: 5 mg via ORAL
  Filled 2020-12-06: qty 5

## 2020-12-06 MED ORDER — METHYLPREDNISOLONE SODIUM SUCC 125 MG IJ SOLR
125.0000 mg | Freq: Once | INTRAMUSCULAR | Status: AC
Start: 2020-12-06 — End: 2020-12-06
  Administered 2020-12-06: 125 mg via INTRAVENOUS
  Filled 2020-12-06: qty 2

## 2020-12-06 MED ORDER — EPINEPHRINE 0.3 MG/0.3ML IJ SOAJ
0.3000 mg | Freq: Once | INTRAMUSCULAR | Status: AC
  Administered 2020-12-06: 0.3 mg via INTRAMUSCULAR
  Filled 2020-12-06: qty 0.3

## 2020-12-06 NOTE — ED Notes (Signed)
Pt back from CT at this time 

## 2020-12-06 NOTE — ED Notes (Signed)
MD Tamala Julian made aware of pt's Lactic of 2.8 at this time

## 2020-12-06 NOTE — ED Notes (Signed)
Pt assisted to stand at bedside and use the urinal. Pt was able to stand up independently without any dizziness, remained steady, and used the urinal independently.

## 2020-12-06 NOTE — ED Triage Notes (Signed)
Per medic, family reports pt was working outside all day, hasn't been drinking a lot of water, they found him lying on the ground outside. Pt able to answer questions per medics, pt reports he was stung by a wasp on the L arm. No hives noted. No allergies to wasps or bees reported.

## 2020-12-06 NOTE — ED Notes (Signed)
Pt speaking normally, AOx4 at this time. VS stable

## 2020-12-06 NOTE — ED Provider Notes (Signed)
[facility] Emergency Department Provider Note  ____________________________________________   Event Date/Time   First MD Initiated Contact with Patient 12/06/20 1501     (approximate)  I have reviewed the triage vital signs and the nursing notes.   HISTORY  Chief Complaint Loss of Consciousness (Working outside and fainted)   HPI Jon [name] is a 70 y.o. male with a past medical history of CVA, HTN, DM, OSA, cerebral AVM, and pituitary adenoma who presents to EMS from home where his wife found him unconscious laying on the ground outside the house.  Patient states he does not remember exactly what happened states he feels very tired from for difficulty with words.  He denies any acute pain but otherwise feels too weak unable to get his words out to provide any additional history.  Per wife at bedside he seemed normal state health this morning without any recent sick symptoms or other recent falls or injuries and that he had been outside working in the heat and told her that he had been stung by a wasp in his left elbow.  She states that he has no known allergies to bees and came to with EMS and received 5 and cc of normal saline.  He still is not back to normal.  No other history is Ame[place] available on presentation         Past Medical History:  Diagnosis Date   Diabetes mellitus without complication ([place])    Hypertension    Pituitary tumor    TIA (transient ischemic attack)     Patient Active Problem List   Diagnosis Date Noted   CVA (cerebral vascular accident) ([place]) 05/13/2019   Migraine 05/12/2019   Depression 05/12/2019   Hypertension    Diabetes mellitus without complication ([place])    Left arm weakness    Hypogonadism in male 03/13/2019   Pituitary adenoma ([place]) 03/13/2019   History of stroke 11/29/2018   Hx of adenomatous colonic polyps 11/29/2018   Other nontraumatic intracerebral hemorrhage ([place]) 09/06/2018   Occipital stroke ([place])  08/19/2018   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 Authorizing Provider  EPINEPHrine 0.3 mg/0.3 mL IJ SOAJ injection Inject 0.3 mg into the muscle as needed for anaphylaxis. 12/06/20  Yes Lucrezia Starch, MD  amLODipine (NORVASC) 5 MG tablet Take 5 mg by mouth daily.  09/06/18 09/06/19  [provider]  rosuvastatin (CRESTOR) 10 MG tablet Take 10 mg by mouth daily.  11/08/18 11/08/19  [provider]    Allergies Beta adrenergic blockers, Topiramate, Aspirin, Benadryl [diphenhydramine], Lisinopril, Nabumetone, and Valproic acid  Family History  Problem Relation Age of Onset   Diabetes Mellitus II Other        Maternal aunt    Social History Social History   Tobacco Use   Smoking status: Never   Smokeless tobacco: Never  Vaping Use   Vaping Use: Never used  Substance Use Topics   Alcohol use: Never   Drug use: Never    Review of  Systems  Review of Systems  Unable to perform ROS: Mental status change     ____________________________________________   PHYSICAL EXAM:  VITAL SIGNS: ED Triage Vitals  Enc Vitals Group     BP      Pulse      Resp      Temp      Temp src      SpO2      Weight      Height      Head Circumference      Peak Flow      Pain Score      Pain Loc      Pain Edu?      Excl. in [place]?    Vitals:   12/06/20 1600 12/06/20 1725  BP: (!) 147/91 (!) 142/88  Pulse: 94 90  Resp: 15 18  Temp:    SpO2: 100% 98%   Physical Exam Vitals and nursing note reviewed.  Constitutional:      General: He is in acute distress.     Appearance: He is well-developed. He is ill-appearing.  HENT:     Head: Normocephalic and atraumatic.     Right Ear: External ear normal.     Left Ear: External ear normal.     Nose: Nose normal.     Mouth/Throat:     Mouth: Mucous membranes are dry.  Eyes:     Conjunctiva/sclera: Conjunctivae normal.  Cardiovascular:     Rate and Rhythm: Normal rate and regular rhythm.     Pulses: Normal pulses.     Heart sounds: No murmur heard. Pulmonary:     Effort: Pulmonary effort is normal. No respiratory distress.     Breath sounds: Normal breath sounds.  Abdominal:     Palpations: Abdomen is soft.     Tenderness: There is no abdominal tenderness.  Musculoskeletal:     Cervical back: Neck supple.  Skin:    General: Skin is warm and dry.     Capillary Refill: Capillary refill takes more than 3 seconds.  Neurological:     Mental Status: He is lethargic.     GCS: GCS eye subscore is 4. GCS verbal subscore is 4. GCS motor subscore is 6.     Cranial Nerves: No facial asymmetry.    PERRLA.  EOMI.  Patient is able to give a thumbs up to this examiner with both hands but is unable to lift his hands off the bed.  He is able wiggle his toes in both feet.  Sensation is intact to light touch about all extremities.  2+ radial and DP pulses.  No obvious trauma to the face  scalp head or neck or significant tenderness over the C/T/L-spine.  Patient's face and chest is fairly erythematous patchy plaques and macules over the chest and around the groin.Marland Kitchen  He is quite warm to palpation.  No stridor over the neck.  Left arm has a little bit of  erythema around the elbow but no significant effusion fluctuance or other overlying skin changes.  This is also blanchable maculopapular. ____________________________________________   LABS (all labs ordered are listed, but only abnormal results are displayed)  Labs Reviewed  COMPREHENSIVE METABOLIC PANEL - Abnormal; Notable for the following components:      Result Value   CO2 21 (*)    Glucose, Bld 150 (*)    Total Bilirubin 1.4 (*)    All other components within normal limits  URINALYSIS, COMPLETE (UACMP) WITH MICROSCOPIC - Abnormal; Notable for the following components:   Color, Urine YELLOW (*)    APPearance CLEAR (*)    Ketones, ur 20 (*)    All other components within normal limits  LACTIC ACID, PLASMA - Abnormal; Notable for the following components:   Lactic Acid, Venous 2.8 (*)    All other components within normal limits  LACTIC ACID, PLASMA - Abnormal; Notable for the following components:   Lactic Acid, Venous 2.2 (*)    All other components within normal limits  RESP PANEL BY RT-PCR (FLU A&B, COVID) ARPGX2  CBC WITH DIFFERENTIAL/PLATELET  CK  MAGNESIUM  PROCALCITONIN  TROPONIN I (HIGH SENSITIVITY)  TROPONIN I (HIGH SENSITIVITY)   ____________________________________________  EKG  Sinus tachycardia with a ventricular rate of 104, normal axis, unremarkable intervals without evidence of clear acute ischemia or significant arrhythmia. ____________________________________________  RADIOLOGY  ED MD interpretation: Chest x-ray shows no focal consolidation, overt edema, effusion, pneumothorax or other clear acute intrathoracic process.  CT head shows no evidence of UTI, SAH, skull fracture or other  clear acute intracranial process.  CT C-spine shows no acute injury.  Official radiology report(s): CT Head Wo Contrast  Result Date: 12/06/2020 CLINICAL DATA:  Altered mental status. EXAM: CT HEAD WITHOUT CONTRAST CT CERVICAL SPINE WITHOUT CONTRAST TECHNIQUE: Multidetector CT imaging of the head and cervical spine was performed following the standard protocol without intravenous contrast. Multiplanar CT image reconstructions of the cervical spine were also generated. COMPARISON:  None. FINDINGS: CT HEAD FINDINGS Brain: Ventricles and sulci are prominent compatible with atrophy. Periventricular and subcortical white matter hypodensities compatible with chronic microvascular ischemic changes. Redemonstrated chronic left right occipital and right occipital lobe infarcts. Redemonstrated old left cerebellar hemisphere lacunar infarct. No evidence for acute cortically based infarct, intracranial hemorrhage, mass lesion or mass-effect. Vascular: Unremarkable Skull: Intact. Sinuses/Orbits: Paranasal sinuses well aerated. Mastoid air cells unremarkable. Orbits unremarkable. Other: None. CT CERVICAL SPINE FINDINGS Alignment: Normal anatomic alignment. Skull base and vertebrae: Intact. Soft tissues and spinal canal: No prevertebral fluid or swelling. No visible canal hematoma. Disc levels: Degenerative disc disease most pronounced C6-7. Anterior endplate osteophytosis. No acute fracture. Upper chest: Unremarkable. Other: None. IMPRESSION: No acute intracranial process. No acute cervical spine fracture. Electronically Signed   By: Lovey Newcomer M.D.   On: 12/06/2020 17:06   CT Cervical Spine Wo Contrast  Result Date: 12/06/2020 CLINICAL DATA:  Altered mental status. EXAM: CT HEAD WITHOUT CONTRAST CT CERVICAL SPINE WITHOUT CONTRAST TECHNIQUE: Multidetector CT imaging of the head and cervical spine was performed following the standard protocol without intravenous contrast. Multiplanar CT image reconstructions of the  cervical spine were also generated. COMPARISON:  None. FINDINGS: CT HEAD FINDINGS Brain: Ventricles and sulci are prominent compatible with atrophy. Periventricular and subcortical white matter hypodensities compatible with chronic microvascular ischemic changes. Redemonstrated chronic left right occipital and right occipital lobe infarcts. Redemonstrated old left cerebellar hemisphere lacunar infarct. No evidence for acute cortically based infarct, intracranial hemorrhage, mass lesion  or mass-effect. Vascular: Unremarkable Skull: Intact. Sinuses/Orbits: Paranasal sinuses well aerated. Mastoid air cells unremarkable. Orbits unremarkable. Other: None. CT CERVICAL SPINE FINDINGS Alignment: Normal anatomic alignment. Skull base and vertebrae: Intact. Soft tissues and spinal canal: No prevertebral fluid or swelling. No visible canal hematoma. Disc levels: Degenerative disc disease most pronounced C6-7. Anterior endplate osteophytosis. No acute fracture. Upper chest: Unremarkable. Other: None. IMPRESSION: No acute intracranial process. No acute cervical spine fracture. Electronically Signed   By: Lovey Newcomer M.D.   On: 12/06/2020 17:06   DG Chest Portable 1 View  Result Date: 12/06/2020 CLINICAL DATA:  Syncope.  Acute mental status change. EXAM: PORTABLE CHEST 1 VIEW COMPARISON:  January 03, 2019 FINDINGS: Healed right rib fractures. The heart, hila, mediastinum, lungs, and pleura are otherwise unremarkable. IMPRESSION: No active disease. Electronically Signed   By: Dorise Bullion III M.D   On: 12/06/2020 16:36    ____________________________________________   PROCEDURES  Procedure(s) performed (including Critical Care):  .Critical Care  Date/Time: 12/06/2020 4:45 PM Performed by: Lucrezia Starch, MD Authorized by: Lucrezia Starch, MD   Critical care provider statement:    Critical care time (minutes):  45   Critical care was necessary to treat or prevent imminent or life-threatening deterioration  of the following conditions: anaphylaxis.   Critical care was time spent personally by me on the following activities:  Discussions with consultants, evaluation of patient's response to treatment, examination of patient, ordering and performing treatments and interventions, ordering and review of laboratory studies, ordering and review of radiographic studies, pulse oximetry, re-evaluation of patient's condition, obtaining history from patient or surrogate and review of old charts   ____________________________________________   [place] / [place] / ED COURSE      Patient presents with above-stated history exam after reportedly being found down outside unconscious by his wife.  He did tell her that he had been stung by a bee in his very weak borderline lethargic on arrival.  He is able to state his name but this examiner thumbs up with both hands but unable provide any significant other history other than nodding in agreement when his wife states he was stung by a bee in his left arm.  On arrival patient is slight tachypneic and hypertensive otherwise stable vital signs on room air.  He appears very sleepy and is having some difficulty with his words but does not have any obvious focal deficits.  He does have erythema on his face chest and groin that appears most consistent with urticarial region.  Location where he was stung reportedly in his left elbow shows some erythema but no retained stinger or other surrounding skin changes.    Initial differential is quite broad and includes possible anaphylaxis, syncope related to heat stroke, mental derangements possible sepsis.  He denies any acute pain while he is initially borderline somnolent appearing very weak on arrival given concern for possible anaphylaxis with fairly significant urticarial noted on exam he was given dose of IM epi as well as Solu-Medrol, IV fluids Pepcid and Zyrtec as he has a listed allergy of Benadryl.   Following this he stated he felt much better on the 5 and 10-minute reassessments.  He was able to speak more clearly and denied any pain or other recent sick symptoms although he states he had been feeling short of breath earlier and does not remember how he ended on the ground.  He is again noted no focal deficits and overall I have a  low suspicion for CVA we will obtain a CT head and neck given he does not know how he got on the ground it is possible he may have fallen and has an occult injury given his age.  ECG is unremarkable for arrhythmia or evidence of ischemia.  Given nonelevated troponinx2 I have a low suspicion for ACS at this time.  CMP shows no significant electrolyte or metabolic derangements.  CBC shows no leukocytosis or acute anemia.  UA shows some ketones but otherwise unremarkable.  CK is within normal limits.  COVID influenza test is negative.  Magnesium is within normal limits.  Initial lactic acid elevated 2.8 which downtrend to 2.2 I suspect reflects mild dehydration and expected response to severe anaphylaxis.  Lower suspicion for sepsis at this time.   CT head shows no evidence of UTI, SAH, skull fracture or other clear acute intracranial process.  CT C-spine shows no acute injury.    On several reassessments patient stated he felt back to baseline.  He was able to ambulate with steady gait unassisted.  He had normal vitals on several reassessments and was observed for approximately 4 and half hours in the emergency room.  Given he is now speaking clearly with a nonfocal neuro exam on my reassessment and denying any other acute symptoms I think he is stable for discharge home with close outpatient follow-up.  Will write Rx for epi as I suspect this entire episode was likely precipitated by a wasp sting to his left elbow causing severe anaphylactic reaction.  Discussed at length proper use of EpiPen.  Patient and wife are amenable to discharge with close outpatient PCP follow-up  and will return immediately if patient experiences any new or worsening of previously improved symptoms. ____________________________________________   FINAL CLINICAL IMPRESSION(S) / ED DIAGNOSES  Final diagnoses:  Anaphylaxis, initial encounter  Dehydration    Medications  EPINEPHrine (EPI-PEN) injection 0.3 mg (0.3 mg Intramuscular Given 12/06/20 1536)  methylPREDNISolone sodium succinate (SOLU-MEDROL) 125 mg/2 mL injection 125 mg (125 mg Intravenous Given 12/06/20 1537)  famotidine (PEPCID) IVPB 20 mg premix (0 mg Intravenous Stopped 12/06/20 1605)  cetirizine HCl (Zyrtec) 5 MG/5ML solution 5 mg (5 mg Oral Given 12/06/20 1553)  lactated ringers bolus 500 mL (0 mLs Intravenous Stopped 12/06/20 1807)     ED Discharge Orders          Ordered    EPINEPHrine 0.3 mg/0.3 mL IJ SOAJ injection  As needed        12/06/20 1911             Note:  This document was prepared using Dragon voice recognition software and may include unintentional dictation errors.    Lucrezia Starch, MD 12/06/20 [phone]

## 2020-12-06 NOTE — ED Notes (Signed)
Pharmacy messaged and called at this time to request zyrtec

## 2021-01-19 NOTE — Progress Notes (Signed)
This is a 70 -year-old male with hypogonadism and he is managed with Testopel. He presents today for Testopel insertion.  Identified upper outer quadrant of hip for insertion; prepped area with Betadine and injected 7 cc's of Lidocaine 1% to anesthetize superficially and distally along trocar tract.  Made 3 mm incision using 11 blade of scalpel; trocar with sharp ended stylet was inserted into subcutaneous tissue in line with femur. Sharp stylet was withdrawn and 6 pellets were placed into trocar well. Testopel pellets advanced into tissue using blunt ended stylet. Trocar removed and incision closed using 6 Steri-Strips. Cleansed area to remove Betadine and covered Steri-Strips with outer Band-Aid.  Careful inspection of insertion is done and patient informed of post procedure instructions.  Advised patient to apply ice to the site for 20-30 minutes every hour if needed.  Avoid hot tubes, swimming or full water immersion of the insertion site for 72 hours.  Bandage may be removed after one week.    Patient is advised to contact the office if experiencing drainage of the insertion site, excessive redness or swelling of the site, chills and/or fevers > 101.5, nausea or vomiting, dizziness or lightheadedness and excessive tenderness.  Avoid strenuous activity and heavy lifting for 72 hours.     He will return in one month for serum testosterone.

## 2021-01-20 ENCOUNTER — Ambulatory Visit (INDEPENDENT_AMBULATORY_CARE_PROVIDER_SITE_OTHER): Payer: Medicare Other | Admitting: Urology

## 2021-01-20 ENCOUNTER — Other Ambulatory Visit: Payer: Self-pay

## 2021-01-20 ENCOUNTER — Encounter: Payer: Self-pay | Admitting: Urology

## 2021-01-20 VITALS — BP 176/91 | HR 76 | Ht 66.0 in | Wt 162.0 lb

## 2021-01-20 DIAGNOSIS — E291 Testicular hypofunction: Secondary | ICD-10-CM

## 2021-01-20 DIAGNOSIS — E349 Endocrine disorder, unspecified: Secondary | ICD-10-CM | POA: Diagnosis not present

## 2021-01-20 MED ORDER — TESTOSTERONE 75 MG IL PLLT
75.0000 mg | PELLET | Freq: Once | Status: AC
  Administered 2021-01-20: 75 mg

## 2021-01-20 NOTE — Addendum Note (Signed)
Addended by: Kyra Manges on: 01/20/2021 10:37 AM   Modules accepted: Orders

## 2021-02-20 ENCOUNTER — Other Ambulatory Visit: Payer: Medicare Other

## 2021-02-20 ENCOUNTER — Other Ambulatory Visit: Payer: Self-pay

## 2021-02-20 DIAGNOSIS — E291 Testicular hypofunction: Secondary | ICD-10-CM

## 2021-02-20 DIAGNOSIS — E349 Endocrine disorder, unspecified: Secondary | ICD-10-CM

## 2021-02-21 LAB — TESTOSTERONE: Testosterone: 576 ng/dL (ref 264–916)

## 2021-02-23 ENCOUNTER — Other Ambulatory Visit: Payer: Self-pay | Admitting: *Deleted

## 2021-02-23 DIAGNOSIS — E291 Testicular hypofunction: Secondary | ICD-10-CM

## 2021-02-23 DIAGNOSIS — N401 Enlarged prostate with lower urinary tract symptoms: Secondary | ICD-10-CM

## 2021-02-23 DIAGNOSIS — N138 Other obstructive and reflux uropathy: Secondary | ICD-10-CM

## 2021-02-23 DIAGNOSIS — E349 Endocrine disorder, unspecified: Secondary | ICD-10-CM

## 2021-04-24 ENCOUNTER — Other Ambulatory Visit: Payer: Medicare Other

## 2021-04-24 ENCOUNTER — Other Ambulatory Visit: Payer: Self-pay

## 2021-04-24 DIAGNOSIS — E349 Endocrine disorder, unspecified: Secondary | ICD-10-CM

## 2021-04-24 DIAGNOSIS — N138 Other obstructive and reflux uropathy: Secondary | ICD-10-CM

## 2021-04-24 DIAGNOSIS — N401 Enlarged prostate with lower urinary tract symptoms: Secondary | ICD-10-CM

## 2021-04-24 DIAGNOSIS — E291 Testicular hypofunction: Secondary | ICD-10-CM

## 2021-04-25 LAB — PSA: Prostate Specific Ag, Serum: 1.4 ng/mL (ref 0.0–4.0)

## 2021-04-25 LAB — HEMOGLOBIN AND HEMATOCRIT, BLOOD
Hematocrit: 45.8 % (ref 37.5–51.0)
Hemoglobin: 15.5 g/dL (ref 13.0–17.7)

## 2021-04-25 LAB — TESTOSTERONE: Testosterone: 230 ng/dL — ABNORMAL LOW (ref 264–916)

## 2021-04-27 NOTE — Progress Notes (Signed)
04/28/2021 10:34 AM   Jon [name] Jun 09, 1950 [id number]  Referring provider: Idelle Crouch, MD [facility] [place],  [place] [address]  Chief Complaint  Patient presents with   Hypogonadism   Urological history: 1.  Nephrolithiasis -Spontaneous passage of a 3 mm left UVJ stone in March 2022  2. BPH with LU TS -PSA 1.4 in 04/2021 -prostate volume ~ 60 cc on 07/2020 CT -I PSS 4/1  3. Testosterone deficiency -Contributing factors of age, diabetes, sleep apnea and pituitary macroadenoma -testosterone level 230 in 04/2021 -managed with Testopel -last insert 03/2020  4. 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 Sleep apnea -does not use CPAP machine - "he can't exhale" - hasn't used in 3 years   6. ED -contributing factors of age, untreated sleep apnea, hypertension, CVA, diabetes, testosterone deficiency and depression -Not sexually active  HPI: Jon [name] is a 70 y.o. who presents today for 3 month follow up.    He has no issues with urination.  Patient denies any modifying or aggravating factors.  Patient denies any gross hematuria, dysuria or suprapubic/flank pain.  Patient denies any fevers, chills, nausea or vomiting.     IPSS     Row Name 04/28/21 1000         International Prostate Symptom Score   How often have you had the sensation of not emptying your bladder? Not at All     How often have you had to urinate less than every two hours? Not at All     How often have you found you stopped and started again several times when you urinated? Not at All     How often have you found it difficult to postpone urination? About half the time     How often have you had a weak urinary stream? Not at All     How often have you had to strain to start urination? Not at All     How many times did you typically get up at night to urinate? 1 Time     Total IPSS Score 4        Quality of Life due to urinary symptoms   If you were to spend the rest of your life with your urinary condition just the way it is now how would you feel about that? Pleased               Score:  1-7 Mild 8-19 Moderate 20-35 Severe    PMH: Past Medical History:  Diagnosis Date   Diabetes mellitus without complication ([place])    Hypertension    Pituitary tumor    TIA (transient ischemic attack)     Surgical History: Past Surgical History:  Procedure Laterality Date   COLONOSCOPY     EYE SURGERY     HERNIA REPAIR      Home Medications:  Allergies as of 04/28/2021       Reactions   Beta Adrenergic Blockers Itching   Topiramate Nausea And Vomiting   Other reaction(s): Dizziness, Hallucination Other Reaction: violence   Wasp Venom Anaphylaxis   Aspirin Other (See Comments)   Per pt's PCP due to spontaneous bleeding in his brain.   Benadryl [diphenhydramine] Hives   Lisinopril Itching   Other reaction(s): Other (See Comments) Leg pain   Nabumetone Other (See Comments)   Reaction: unknown   Valproic Acid Other (See Comments)  Reaction: Headache        Medication List        Accurate as of April 28, 2021 10:34 AM. If you have any questions, ask your nurse or doctor.          amLODipine 5 MG tablet Commonly known as: NORVASC Take 5 mg by mouth daily.   DULoxetine 30 MG capsule Commonly known as: CYMBALTA Take 30 mg by mouth daily.   EPINEPHrine 0.3 mg/0.3 mL Soaj injection Commonly known as: EPI-PEN Inject 0.3 mg into the muscle as needed for anaphylaxis.   rosuvastatin 10 MG tablet Commonly known as: CRESTOR Take 10 mg by mouth daily.        Allergies:  Allergies  Allergen Reactions   Beta Adrenergic Blockers Itching   Topiramate Nausea And Vomiting    Other reaction(s): Dizziness, Hallucination Other Reaction: violence   Wasp Venom Anaphylaxis   Aspirin Other (See Comments)    Per pt's PCP due to spontaneous bleeding in  his brain.   Benadryl [Diphenhydramine] Hives   Lisinopril Itching    Other reaction(s): Other (See Comments) Leg pain   Nabumetone Other (See Comments)    Reaction: unknown   Valproic Acid Other (See Comments)    Reaction: Headache    Family History: Family History  Problem Relation Age of Onset   Diabetes Mellitus II Other        Maternal aunt    Social History:  reports that he has never smoked. He has never used smokeless tobacco. He reports that he does not drink alcohol and does not use drugs.  ROS: Pertinent ROS in HPI  Physical Exam: BP (!) 180/90    Pulse 73    Ht 5\' 6"  (1.676 m)    Wt 162 lb (73.5 kg)    BMI 26.15 kg/m   Constitutional:  Well nourished. Alert and oriented, No acute distress. HEENT: [place] AT, mask in place.  Trachea midline Cardiovascular: No clubbing, cyanosis, or edema. Respiratory: Normal respiratory effort, no increased work of breathing. GU: No CVA tenderness.  No bladder fullness or masses.  Patient with circumcised phallus.  Urethral meatus is patent.  No penile discharge. No penile lesions or rashes. Scrotum without lesions, cysts, rashes and/or edema.  Testicles are located scrotally bilaterally. No masses are appreciated in the testicles. Left and right epididymis are normal. Rectal: Patient with  normal sphincter tone. Anus and perineum without scarring or rashes. No rectal masses are appreciated. Prostate is approximately 50 grams, no nodules are appreciated. Seminal vesicles could not be palpated Neurologic: Grossly intact, no focal deficits, moving all 4 extremities. Psychiatric: Normal mood and affect.   Laboratory Data: Results for orders placed or performed in visit on 04/24/21  PSA  Result Value Ref Range   Prostate Specific Ag, Serum 1.4 0.0 - 4.0 ng/mL  Testosterone  Result Value Ref Range   Testosterone 230 (L) 264 - 916 ng/dL  Hemoglobin and hematocrit, blood  Result Value Ref Range   Hemoglobin 15.5 13.0 - 17.7 g/dL    Hematocrit 45.8 37.5 - 51.0 %  I have reviewed the labs.   Pertinent Imaging: No recent imaging  Assessment & Plan:    1. Testosterone deficiency  -Testosterone not at therapeutic levels -We will schedule Testopel insertion   2. BPH with LUTS -PSA stable -DRE benign -continue conservative management, avoiding bladder irritants and timed voiding's   3. Nephrolithiasis -declined metabolic work up -KUB in 56/2130  4.  Pituitary macroadenoma -Last imaging was  2021 -Was followed by neurology, but he no showed for follow-up appointments -He expressed that he states no other doctors seem to be concerned with it so he sees no issue with that at this time -I expressed to him that it could affect his vision significant continue to grow or possibility of it being malignant, he states he understands these risks and wants to just let it be unless it affects his vision    Return for Testopel .  These notes generated with voice recognition software. I apologize for typographical errors.  Zara Council, PA-C  [facility] [address]  [place], [place] [address] [phone]

## 2021-04-28 ENCOUNTER — Encounter: Payer: Self-pay | Admitting: Urology

## 2021-04-28 ENCOUNTER — Other Ambulatory Visit: Payer: Self-pay

## 2021-04-28 ENCOUNTER — Ambulatory Visit (INDEPENDENT_AMBULATORY_CARE_PROVIDER_SITE_OTHER): Payer: Medicare Other | Admitting: Urology

## 2021-04-28 VITALS — BP 180/90 | HR 73 | Ht 66.0 in | Wt 162.0 lb

## 2021-04-28 DIAGNOSIS — D352 Benign neoplasm of pituitary gland: Secondary | ICD-10-CM

## 2021-04-28 DIAGNOSIS — N200 Calculus of kidney: Secondary | ICD-10-CM | POA: Diagnosis not present

## 2021-04-28 DIAGNOSIS — E349 Endocrine disorder, unspecified: Secondary | ICD-10-CM | POA: Diagnosis not present

## 2021-04-28 DIAGNOSIS — N401 Enlarged prostate with lower urinary tract symptoms: Secondary | ICD-10-CM | POA: Diagnosis not present

## 2021-04-28 DIAGNOSIS — N138 Other obstructive and reflux uropathy: Secondary | ICD-10-CM

## 2021-05-21 NOTE — Progress Notes (Deleted)
This is a *** -year-old male with hypogonadism and he is managed with Testopel. He presents today for Testopel insertion.  Patient is placed on the exam table in the *** lateral jackknife position.  Identified upper outer quadrant of hip for insertion; prepped area with *** and injected *** cc's of Lidocaine 1% with Epinephrine to anesthetize superficially and distally along trocar tract.  Made 3 mm incision using 11 blade of scalpel; trocar with sharp ended stylet was inserted into subcutaneous tissue in line with femur. Sharp stylet was withdrawn and *** pellets were placed into trocar well. Testopel pellets advanced into tissue using blunt ended stylet. Trocar removed and incision closed using *** Steri-Strips. Cleansed area to remove *** and covered Steri-Strips with outer Band-Aid.  Careful inspection of insertion is done and patient informed of post procedure instructions.  Advised patient to apply ice to the site for 20-30 minutes every hour if needed.  Avoid hot tubes, swimming or full water immersion of the insertion site for 72 hours.  Bandage may be removed after one week.    Patient is advised to contact the office if experiencing drainage of the insertion site, excessive redness or swelling of the site, chills and/or fevers > 101.5, nausea or vomiting, dizziness or lightheadedness and excessive tenderness.  Avoid strenuous activity and heavy lifting for 72 hours.     He will return in *** month for serum testosterone.

## 2021-05-22 ENCOUNTER — Other Ambulatory Visit: Payer: Self-pay

## 2021-05-22 ENCOUNTER — Ambulatory Visit: Payer: Medicare Other | Admitting: Urology

## 2021-05-22 DIAGNOSIS — E349 Endocrine disorder, unspecified: Secondary | ICD-10-CM

## 2021-06-03 ENCOUNTER — Observation Stay: Payer: Medicare Other

## 2021-06-03 ENCOUNTER — Observation Stay
Admission: EM | Admit: 2021-06-03 | Discharge: 2021-06-04 | Disposition: A | Discharge status: Home / Self Care | Payer: Medicare Other | Attending: Internal Medicine | Admitting: Internal Medicine

## 2021-06-03 ENCOUNTER — Other Ambulatory Visit: Payer: Self-pay

## 2021-06-03 ENCOUNTER — Emergency Department: Payer: Medicare Other

## 2021-06-03 ENCOUNTER — Encounter: Payer: Self-pay | Admitting: Emergency Medicine

## 2021-06-03 DIAGNOSIS — G20 Parkinson's disease: Secondary | ICD-10-CM | POA: Diagnosis not present

## 2021-06-03 DIAGNOSIS — Q282 Arteriovenous malformation of cerebral vessels: Secondary | ICD-10-CM

## 2021-06-03 DIAGNOSIS — I10 Essential (primary) hypertension: Secondary | ICD-10-CM | POA: Diagnosis not present

## 2021-06-03 DIAGNOSIS — G459 Transient cerebral ischemic attack, unspecified: Principal | ICD-10-CM | POA: Diagnosis present

## 2021-06-03 DIAGNOSIS — Z8673 Personal history of transient ischemic attack (TIA), and cerebral infarction without residual deficits: Secondary | ICD-10-CM | POA: Insufficient documentation

## 2021-06-03 DIAGNOSIS — I728 Aneurysm of other specified arteries: Secondary | ICD-10-CM | POA: Diagnosis present

## 2021-06-03 DIAGNOSIS — Z20822 Contact with and (suspected) exposure to covid-19: Secondary | ICD-10-CM | POA: Diagnosis not present

## 2021-06-03 DIAGNOSIS — R55 Syncope and collapse: Secondary | ICD-10-CM | POA: Diagnosis present

## 2021-06-03 DIAGNOSIS — E1159 Type 2 diabetes mellitus with other circulatory complications: Secondary | ICD-10-CM

## 2021-06-03 DIAGNOSIS — Z79899 Other long term (current) drug therapy: Secondary | ICD-10-CM | POA: Insufficient documentation

## 2021-06-03 DIAGNOSIS — E119 Type 2 diabetes mellitus without complications: Secondary | ICD-10-CM | POA: Diagnosis not present

## 2021-06-03 DIAGNOSIS — R531 Weakness: Secondary | ICD-10-CM

## 2021-06-03 DIAGNOSIS — I639 Cerebral infarction, unspecified: Secondary | ICD-10-CM | POA: Diagnosis present

## 2021-06-03 DIAGNOSIS — D352 Benign neoplasm of pituitary gland: Secondary | ICD-10-CM | POA: Diagnosis present

## 2021-06-03 HISTORY — DX: Arteriovenous malformation of cerebral vessels: Q28.2

## 2021-06-03 LAB — RESP PANEL BY RT-PCR (FLU A&B, COVID) ARPGX2
Influenza A by PCR: NEGATIVE
Influenza B by PCR: NEGATIVE
SARS Coronavirus 2 by RT PCR: NEGATIVE

## 2021-06-03 LAB — CBC
HCT: 44.8 % (ref 39.0–52.0)
Hemoglobin: 15.6 g/dL (ref 13.0–17.0)
MCH: 31.1 pg (ref 26.0–34.0)
MCHC: 34.8 g/dL (ref 30.0–36.0)
MCV: 89.2 fL (ref 80.0–100.0)
Platelets: 178 10*3/uL (ref 150–400)
RBC: 5.02 MIL/uL (ref 4.22–5.81)
RDW: 12.3 % (ref 11.5–15.5)
WBC: 4.3 10*3/uL (ref 4.0–10.5)
nRBC: 0 % (ref 0.0–0.2)

## 2021-06-03 LAB — BASIC METABOLIC PANEL
Anion gap: 9 (ref 5–15)
BUN: 20 mg/dL (ref 8–23)
CO2: 23 mmol/L (ref 22–32)
Calcium: 9.2 mg/dL (ref 8.9–10.3)
Chloride: 106 mmol/L (ref 98–111)
Creatinine, Ser: 0.88 mg/dL (ref 0.61–1.24)
GFR, Estimated: >60 mL/min (ref >60)
Glucose, Bld: 174 mg/dL — ABNORMAL HIGH (ref 70–99)
Potassium: 3.9 mmol/L (ref 3.5–5.1)
Sodium: 138 mmol/L (ref 135–145)

## 2021-06-03 LAB — TROPONIN I (HIGH SENSITIVITY)
Troponin I (High Sensitivity): 7 ng/L (ref <18)
Troponin I (High Sensitivity): 10 ng/L (ref <18)

## 2021-06-03 LAB — LIPID PANEL
Cholesterol: 215 mg/dL — ABNORMAL HIGH (ref 0–200)
HDL: 32 mg/dL — ABNORMAL LOW (ref >40)
LDL Cholesterol: 103 mg/dL — ABNORMAL HIGH (ref 0–99)
Total CHOL/HDL Ratio: 6.7 RATIO
Triglycerides: 400 mg/dL — ABNORMAL HIGH (ref <150)
VLDL: 80 mg/dL — ABNORMAL HIGH (ref 0–40)

## 2021-06-03 LAB — CBG MONITORING, ED: Glucose-Capillary: 149 mg/dL — ABNORMAL HIGH (ref 70–99)

## 2021-06-03 MED ORDER — GADOBUTROL 1 MMOL/ML IV SOLN
7.0000 mL | Freq: Once | INTRAVENOUS | Status: AC | PRN
  Administered 2021-06-03: 16:00:00 7 mL via INTRAVENOUS

## 2021-06-03 MED ORDER — ACETAMINOPHEN 160 MG/5ML PO SOLN
650.0000 mg | ORAL | Status: DC | PRN
  Filled 2021-06-03: qty 20.3

## 2021-06-03 MED ORDER — HYDROXYZINE HCL 25 MG PO TABS
25.0000 mg | ORAL_TABLET | Freq: Once | ORAL | Status: AC
  Administered 2021-06-03: 20:00:00 25 mg via ORAL
  Filled 2021-06-03: qty 1

## 2021-06-03 MED ORDER — STROKE: EARLY STAGES OF RECOVERY BOOK: Freq: Once | Status: DC

## 2021-06-03 MED ORDER — SODIUM CHLORIDE 0.9 % IV SOLN: INTRAVENOUS | Status: DC

## 2021-06-03 MED ORDER — ACETAMINOPHEN 325 MG PO TABS
650.0000 mg | ORAL_TABLET | Freq: Four times a day (QID) | ORAL | Status: DC | PRN
  Administered 2021-06-03: 14:00:00 650 mg via ORAL
  Filled 2021-06-03: qty 2

## 2021-06-03 MED ORDER — ACETAMINOPHEN 650 MG RE SUPP: 650.0000 mg | RECTAL | Status: DC | PRN

## 2021-06-03 MED ORDER — ACETAMINOPHEN 325 MG PO TABS: 650.0000 mg | ORAL_TABLET | ORAL | Status: DC | PRN

## 2021-06-03 MED ORDER — CYCLOBENZAPRINE HCL 10 MG PO TABS
5.0000 mg | ORAL_TABLET | Freq: Three times a day (TID) | ORAL | Status: DC | PRN
  Administered 2021-06-03 – 2021-06-04 (×2): 5 mg via ORAL
  Filled 2021-06-03 (×2): qty 1

## 2021-06-03 NOTE — ED Notes (Signed)
Pt given lunch tray at this time. VSS. Will continue to monitor for changes.

## 2021-06-03 NOTE — Code Documentation (Signed)
Stroke Response Nurse Documentation Code Documentation  Jon [name] is a 71 y.o. male arriving to [facility] ED via Private Vehicle on 06/03/2021 with past medical hx of ICH. Not on anticoagulants. Code stroke was activated by ED MD.   Patient from [facility] where he was LKW at 0930 and now complaining of left leg weakness and headache. Pt was walking into a MD appt when he had sudden onset of left leg weakness.    Stroke team at the bedside on patient arrival. Labs drawn and patient cleared for CT by Dr. Corky Downs. Patient to CT with team. NIHSS 6, see documentation for details and code stroke times. The following imaging was completed:  CT head. Patient is not a candidate for IV Thrombolytic due to Previous ICH.     Bedside handoff with ED RN Georgina Peer .    Velta Addison Stroke Designer, fashion/clothing

## 2021-06-03 NOTE — Consult Note (Addendum)
NEURO HOSPITALIST CONSULT NOTE   Requestig physician: Dr. Corky Downs  Reason for Consult: Acute onset of dizziness with near syncope  History obtained from:  Patient and Chart     HPI:                                                                                                                                          Jon [name] is an 71 y.o. male with a history of two "bleeding strokes" in 2016, HTN, DM, TIA, pituitary tumor and, per patient, "Parkinson's", who presented to the ED via [facility] from [facility] where he had a near syncopal episode in the [facility] lobby. The patient also endorsed having labored breathing and HTN after arrival to the ED. Of note, his BP at [facility] was 260/140. LKN 0930. The patient stated that his symptoms started this morning while he was walking into [place]. He stated that he does not take any BP medications.   Per history in Epic, the patient has a history of "spontaneous bleeding in his brain" while on ASA previously. The patient confirms this during interview. Although there is no listed history of AVM in Epic, the patient states that he has an AVM while pointing to the left side of his head.   EKG: Normal sinus rhythm; Septal infarct, age undetermined eGFR by labs is normal.   Past Medical History:  Diagnosis Date   Diabetes mellitus without complication ([place])    Hypertension    Pituitary tumor    TIA (transient ischemic attack)     Past Surgical History:  Procedure Laterality Date   COLONOSCOPY     EYE SURGERY     HERNIA REPAIR      Family History  Problem Relation Age of Onset   Diabetes Mellitus II Other        Maternal aunt              Social History:  reports that he has never smoked. He has never used smokeless tobacco. He reports that he does not drink alcohol and does not use drugs.  Allergies  Allergen Reactions   Beta Adrenergic Blockers Itching   Topiramate Nausea And Vomiting    Other reaction(s): Dizziness,  Hallucination Other Reaction: violence   Wasp Venom Anaphylaxis   Aspirin Other (See Comments)    Per pt's PCP due to spontaneous bleeding in his brain.   Benadryl [Diphenhydramine] Hives   Lisinopril Itching    Other reaction(s): Other (See Comments) Leg pain   Nabumetone Other (See Comments)    Reaction: unknown   Valproic Acid Other (See Comments)    Reaction: Headache    MEDICATIONS:  No current facility-administered medications on file prior to encounter.   Current Outpatient Medications on File Prior to Encounter  Medication Sig Dispense Refill   amLODipine (NORVASC) 5 MG tablet Take 5 mg by mouth daily.      DULoxetine (CYMBALTA) 30 MG capsule Take 30 mg by mouth daily.     EPINEPHrine 0.3 mg/0.3 mL IJ SOAJ injection Inject 0.3 mg into the muscle as needed for anaphylaxis. 1 each 0   rosuvastatin (CRESTOR) 10 MG tablet Take 10 mg by mouth daily.        ROS:                                                                                                                                       States that he has a chronic tremor. Also with a 1/60 holocephalic headache. Other ROS as per HPI.    Blood pressure (!) 169/91, pulse 78, temperature 98.4 F (36.9 C), resp. rate 20, height _0  (1.676 m), weight 73.5 kg, SpO2 99 %.   General Examination:                                                                                                       Physical Exam  HEENT-  /AT  Lungs- Respirations unlabored Extremities- No edema   Neurological Examination Mental Status: Awake and alert after initially staring straight ahead with hypophonic stuttering speech that had a functional quality. After he is seated on the CT table, he is able to answer all questions fluently, without dysarthria and follow all commands. At times he will have a stuttering quality to his  verbal output that has an embellished quality and resolves with distraction. Naming intact. Omitted two words when asked to repeat a phrase. Oriented to city, state, year, month and day.  Cranial Nerves: II: PERRL. Temporal visual fields intact with no extinction to DSS.  III,IV, VI: No ptosis. EOMI. No nystagmus.  V: Subjective temp sensation absent to cheeks. Reacts normally to FT bilaterally VII: Smile symmetric VIII: Hearing intact to voice IX,X: No hypophonia or hoarseness XI: Symmetric XII: Midline tongue extension Motor: There is intermittent giveway weakness in all 4 limbs that resolves transiently with distraction and improves with coaching. Maximum strength elicited is 5/5 in all 4 extremities proximally and distally except for 4/5 ADF and APF on the left.  Also noted is slow drift of LUE with testing of [place].  Diffuse action tremor of BUE is present, which is intermittent.  Some BLE tremor noted when attention is focused on legs during strength testing.  Sensory: FT and temp sensation equal bilaterally. No extinction to DSS.  Deep Tendon Reflexes: 2+ and symmetric brachioradialis and achilles. 3+ bilateral patellae Plantars: Right: downgoing   Left: downgoing Cerebellar: No ataxia with FNF or H-S bilaterally Gait: Able to sit upright without assistance. No truncal instability noted.    Lab Results: Basic Metabolic Panel: Recent Labs  Lab 06/03/21 0943  NA 138  K 3.9  CL 106  CO2 23  GLUCOSE 174*  BUN 20  CREATININE 0.88  CALCIUM 9.2    CBC: Recent Labs  Lab 06/03/21 0943  WBC 4.3  HGB 15.6  HCT 44.8  MCV 89.2  PLT 178    Cardiac Enzymes: No results for input(s): CKTOTAL, CKMB, CKMBINDEX, TROPONINI in the last 168 hours.  Lipid Panel: No results for input(s): CHOL, TRIG, HDL, CHOLHDL, VLDL, LDLCALC in the last 168 hours.  Imaging: DG Chest Port 1 View  Result Date: 06/03/2021 CLINICAL DATA:  Near syncope. EXAM: PORTABLE CHEST 1 VIEW COMPARISON:  December 06, 2020. FINDINGS: The heart size and mediastinal contours are within normal limits. Both lungs are clear. Old right rib fractures are noted. IMPRESSION: No active disease. Electronically Signed   By: Marijo Conception M.D.   On: 06/03/2021 09:59     Assessment: 71 year old male presenting to the ED after acute onset of dizziness with near syncope while in the lobby of the [facility] today. Code Stroke was called after patient exhibited a stuttering speech pattern in the ED with difficulty following commands.  1. Exam reveals no definite focal deficits in the context of features most consistent with embellishment and anxiety.  2. CT head: Periventricular and subcortical white matter hypodensities compatible with chronic microvascular ischemic changes. 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 Redemonstrated old left cerebellar hemisphere lacunar infarct. No evidence for acute cortically based infarct, intracranial hemorrhage, mass lesion or mass-effect. 3. ASA is listed as an allergy in Epic due to prior hemorrhages while on this medication.  4. Overall presentation most consistent with anxiety-related symptoms after near-syncopal episode. TIA is possible, but low on the DDx. Given patient endorsing a history of intracranial AVM, and there being a low suspicion for stroke based on overall presentation today, TNK is not indicated.   Recommendations: 1. MRI brain with and without contrast (ordered) 2. Carotid ultrasound (ordered) 3. TTE.  4. MRA head (ordered) 5. PT consult, OT consult, Speech consult 6. Cardiac telemetry to evaluate for possible atrial fibrillation or other arrhythmia 7. Hold off on ASA for now given patient endorsing prior brain bleed while on ASA as well as stating that he has an AVM while pointing to the left side of his head.  8. Risk factor modification 9. Frequent neuro checks 10. NPO until passes stroke swallow screen 11. HgbA1c, fasting  lipid panel  Addendum: - MRA head: Apparent moderate stenosis of the distal right cervical/proximal petrous ICA may be in part artifactual. Otherwise, patent intracranial vasculature with no hemodynamically significant stenosis or occlusion. 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 - MRI brain: No evidence of acute intracranial hemorrhage or infarct. Remote infarcts in the left parietal and bilateral occipital lobes, unchanged. Numerous areas of old blood products overlying the bilateral cerebral hemispheres as above, overall increased in extent since most recent brain MRI from 05/12/2019. 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 MRI  brain was personally reviewed by Neurology: the chronic hemorrhagic lesions on MRI are suggestive of possible amyloid angiopathy. He will need outpatient Neurology follow up for this.  - Carotid ultrasound: Atherosclerotic disease involving bilateral carotid arteries. Estimated degree of stenosis in the internal carotid arteries is less than 50% bilaterally. Patent vertebral arteries with antegrade flow. - TTE is pending.    Electronically signed: Dr. Kerney Elbe 06/03/2021, 10:32 AM

## 2021-06-03 NOTE — Progress Notes (Signed)
CODE STROKE- PHARMACY COMMUNICATION   Time CODE STROKE called/page received:[id number]  Time response to CODE STROKE was made (in person or via phone): in person  Time Stroke Kit retrieved from [place] (only if needed):immediately *NOT GIVEN* per neurology assessment. HTN urgency  Name of Provider/Nurse contacted: Dr Cheral Marker  Past Medical History:  Diagnosis Date   Diabetes mellitus without complication ([place])    Hypertension    Pituitary tumor    TIA (transient ischemic attack)    Prior to Admission medications   Medication Sig Start Date End Date Taking? Authorizing Provider  amLODipine (NORVASC) 5 MG tablet Take 5 mg by mouth daily.  09/06/18 09/06/19  [provider]  DULoxetine (CYMBALTA) 30 MG capsule Take 30 mg by mouth daily. 01/17/21   [provider]  EPINEPHrine 0.3 mg/0.3 mL IJ SOAJ injection Inject 0.3 mg into the muscle as needed for anaphylaxis. 12/06/20   Lucrezia Starch, MD  rosuvastatin (CRESTOR) 10 MG tablet Take 10 mg by mouth daily.  11/08/18 11/08/19  [provider]    Sabrena Gavitt Rodriguez-Guzman PharmD, BCPS 06/03/2021 10:59 AM

## 2021-06-03 NOTE — ED Notes (Signed)
Pt to MRI at this time.

## 2021-06-03 NOTE — ED Triage Notes (Signed)
Pt comes into the ED via POV from [facility] clinic where he had a near syncopal episode in the [facility] lobby.  Pt also states he is having labored breathing and HTN.  BP at [facility] was 260/140.  Pt states the symptoms started this morning while he was walking into [place] today.  Pt has h/o stroke. Denies any cardiac history other than HTN, and patient does not take any BP medications.

## 2021-06-03 NOTE — Progress Notes (Signed)
PT Cancellation Note  Patient Details Name: Jon [name] MRN: [id number] DOB: March 01, 1951   Cancelled Treatment:    Reason Eval/Treat Not Completed: Patient at procedure or test/unavailable: Per nursing pt not yet returned to his room from MRI. Will attempt to see pt at a future date/time as medically appropriate.    Linus Salmons PT, DPT 06/03/21, 4:27 PM

## 2021-06-03 NOTE — ED Notes (Signed)
Patient transported to CT 

## 2021-06-03 NOTE — ED Notes (Signed)
Pt back from MRI 

## 2021-06-03 NOTE — ED Notes (Signed)
Patient reports generalized itching. No rash noted. No shortness of breath. States he cannot take benadryl, states it makes him "crazy." K. Foust, NP messaged via secure chat.

## 2021-06-03 NOTE — Progress Notes (Signed)
°   06/03/21 1400  Clinical Encounter Type  Visited With Patient and family together  Visit Type Follow-up  Referral From Chaplain  Consult/Referral To [place] responded to follow up from on call chaplain. Patient was reserved and seemed sad and down about recurrent ED visits. Chaplain provided reflective listening and compassionate non-anxious presence. Patient appreciated [place] visit.

## 2021-06-03 NOTE — ED Notes (Signed)
Pt's visitor reports the Pt has a history of HTN and is supposed to take medication, but is noncompliant.

## 2021-06-03 NOTE — Progress Notes (Signed)
°  Chaplain On-Call responded to Code Stroke notification for room ED-44.  Arriving there, this Chaplain accompanied the Nurse and the patient's wife to the new location of room ED-2.  The patient was receiving a CT scan procedure.  Chaplain provided much spiritual and emotional support for Jon [name], learning from her about previous strokes for the patient, and also about their rich and full life together for 53 years.  Jon [name] requested private time to call their sons. Chaplains will remain available for additional support as needed.  Chaplain Pollyann Samples M.Div., [facility]

## 2021-06-03 NOTE — ED Provider Notes (Signed)
[facility] Provider Note    Event Date/Time   First MD Initiated Contact with Patient 06/03/21 1005     (approximate)   History   Near Syncope   HPI  Jon [name] is a 71 y.o. male with a history of Parkinson's, diabetes, prior CVA sent from clinic because of a reported near syncopal episode.  His wife describes that he walked from the parking lot into the clinic and was feeling winded and weak.  He complained of a headache and his blood pressure at the clinic was apparently significantly elevated.  He is having some word finding difficulty, his wife notes that occasionally this is caused by his Parkinson's     Physical Exam   Triage Vital Signs: ED Triage Vitals  Enc Vitals Group     BP 06/03/21 0934 (!) 181/94     Pulse Rate 06/03/21 0934 94     Resp 06/03/21 0934 20     Temp 06/03/21 0934 98.4 F (36.9 C)     Temp Source 06/03/21 0934 Oral     SpO2 06/03/21 0934 100 %     Weight 06/03/21 0936 73.5 kg (162 lb 0.6 oz)     Height 06/03/21 0936 1.676 m (5\' 6" )     Head Circumference --      Peak Flow --      Pain Score 06/03/21 0936 5     Pain Loc --      Pain Edu? --      Excl. in [place]? --     Most recent vital signs: Vitals:   06/03/21 1130 06/03/21 1217  BP: 135/85 (!) 129/59  Pulse: 71 75  Resp: 17 20  Temp:    SpO2: 96% 99%     General: Awake, no distress.  CV:  Good peripheral perfusion.  Resp:  Normal effort.  Abd:  No distention.  Other:  Neuro: Difficulty raising left leg off of the bed some weakness in the left arm as well.  Word finding difficulty   ED Results / Procedures / Treatments   Labs (all labs ordered are listed, but only abnormal results are displayed) Labs Reviewed  BASIC METABOLIC PANEL - Abnormal; Notable for the following components:      Result Value   Glucose, Bld 174 (*)    All other components within normal limits  CBG MONITORING, ED - Abnormal; Notable for the following components:    Glucose-Capillary 149 (*)    All other components within normal limits  RESP PANEL BY RT-PCR (FLU A&B, COVID) ARPGX2  CBC  ETHANOL  PROTIME-INR  APTT  DIFFERENTIAL  URINE DRUG SCREEN, QUALITATIVE ([facility] ONLY)  URINALYSIS, ROUTINE W REFLEX MICROSCOPIC  TROPONIN I (HIGH SENSITIVITY)  TROPONIN I (HIGH SENSITIVITY)     EKG  ED ECG REPORT I, Lavonia Drafts, the attending physician, personally viewed and interpreted this ECG.  Date: 06/03/2021  Rhythm: normal sinus rhythm QRS Axis: normal Intervals: normal ST/T Wave abnormalities: normal Narrative Interpretation: no evidence of acute ischemia    RADIOLOGY CT head reviewed by me, no acute abnormality    PROCEDURES:  Critical Care performed: no     Procedures   MEDICATIONS ORDERED IN ED: Medications  cyclobenzaprine (FLEXERIL) tablet 5 mg (has no administration in time range)  acetaminophen (TYLENOL) tablet 650 mg (has no administration in time range)     IMPRESSION / MDM / ASSESSMENT AND PLAN / ED COURSE  I reviewed the triage vital signs and the  nursing notes.  Patient presents with complaints of dizziness, near syncope after walking from the parking lot.  On exam he is significantly weak in the left lower extremity, he does have a history of a CVA and I asked his wife if these deficits were old as listed in the chart and she says that him being unable to lift his leg is very atypical  He is also having speech difficulties, his wife notes that they are worse than typical.  Apparently the symptoms started along with a headache within the last hour, I will activate a code stroke for consideration of TPA  Reviewed CBC and CMP and glucose, unremarkable lab results  CT head is reassuring.  Patient seen by neurology, they do not feel tpa is necessary  Have consulted and discussed with the hospitalist who will admit the patient for further evaluation            FINAL CLINICAL IMPRESSION(S) / ED DIAGNOSES    Final diagnoses:  Near syncope  TIA (transient ischemic attack)     Rx / DC Orders   ED Discharge Orders     None        Note:  This document was prepared using Dragon voice recognition software and may include unintentional dictation errors.   Lavonia Drafts, MD 06/03/21 1324

## 2021-06-03 NOTE — H&P (Signed)
[facility] History and Physical  CID AGENA GQQ:[id number] DOB: 07-02-1950 DOA: 06/03/2021  Referring physician: Dr. Corky Downs PCP: Idelle Crouch, MD   Chief Complaint: weakness, speech difficulty  HPI: Jon [name] is a 71 y.o. male with hx of occipital CVA, hypertension, diabetes, cerebral AVM, pituitary adenoma, pseudoaneurysm of subclavian artery, who presented this morning with sudden onset of left sided weakness and speech difficulty.  On my history patient reports he was coming to [facility] for a visit with his neurologist.  He parked the car far away unless he started to walk towards the building immediately began to feel very unwell, with combination of headache and dizziness.  He denies having any chest pain, palpitations, or shortness of breath.  He was brought into the clinic for an evaluation and subsequently brought over to the ED. Per review of brief note from Dr. Doy Hutching he had slurred speech and had blood pressure of 260/140.  He was initially seen in fast track, but ED provider immediately identified signs concerning for possible stroke.  ED provider noted left-sided weakness and activated code stroke.  On my history patient reports that he does not nor has he perceived any left-sided weakness, he does feel as if the left side of his face has an unusual sensation though.  He reports he does not take any medications, that he was on many in the past but felt they were hurting more than helping so he stopped taking them.  He does not take aspirin due to concerns about brain bleeding.  He reports that he does not get on well with his current neurologist, and very much misses his old neurologist who left for [facility].  Also reports significant headaches and feels like no one cares about them.  Reports he has had nearly daily headache that he rates 5 out of 10 since November and has been taking ibuprofen multiple times daily since that time.  In the ED initial  vital signs notable for mild hypertension, remainder vital signs normal.  Initial BMP notable only for glucose of 174, CBC normal, COVID and flu test negative, chest x-ray was unremarkable, EKG was unremarkable with no ischemic changes and normal rhythm.  Code stroke was called and CT head without contrast was obtained, no acute findings were seen, remote left parietal infarct was seen.  Neurology was consulted with recommendations pending at time of admission.  Review of Systems:  Pertinent positives and negative per HPI, all others reviewed and negative  Past Medical History:  Diagnosis Date   Diabetes mellitus without complication ([place])    Hypertension    Pituitary tumor    TIA (transient ischemic attack)    Past Surgical History:  Procedure Laterality Date   COLONOSCOPY     EYE SURGERY     HERNIA REPAIR     Social History:  reports that he has never smoked. He has never used smokeless tobacco. He reports that he does not drink alcohol and does not use drugs.  Allergies  Allergen Reactions   Beta Adrenergic Blockers Itching   Topiramate Nausea And Vomiting    Other reaction(s): Dizziness, Hallucination Other Reaction: violence   Wasp Venom Anaphylaxis   Aspirin Other (See Comments)    Per pt's PCP due to spontaneous bleeding in his brain.   Benadryl [Diphenhydramine] Hives   Lisinopril Itching    Other reaction(s): Other (See Comments) Leg pain   Nabumetone Other (See Comments)    Reaction: unknown   Valproic  Acid Other (See Comments)    Reaction: Headache    Family History  Problem Relation Age of Onset   Diabetes Mellitus II Other        Maternal aunt     Prior to Admission medications   Medication Sig Start Date End Date Taking? Authorizing Provider  amLODipine (NORVASC) 5 MG tablet Take 5 mg by mouth daily.  09/06/18 09/06/19  [provider]  DULoxetine (CYMBALTA) 30 MG capsule Take 30 mg by mouth daily. 01/17/21   [provider]  EPINEPHrine  0.3 mg/0.3 mL IJ SOAJ injection Inject 0.3 mg into the muscle as needed for anaphylaxis. 12/06/20   Lucrezia Starch, MD  rosuvastatin (CRESTOR) 10 MG tablet Take 10 mg by mouth daily.  11/08/18 11/08/19  [provider]   Physical Exam: Vitals:   06/03/21 0954 06/03/21 1100 06/03/21 1130 06/03/21 1217  BP: (!) 169/91 (!) 172/106 135/85 (!) 129/59  Pulse: 78 84 71 75  Resp: 20 17 17 20   Temp: 98.4 F (36.9 C)     TempSrc:      SpO2: 99% 97% 96% 99%  Weight:      Height:        Wt Readings from Last 3 Encounters:  06/03/21 73.5 kg  04/28/21 73.5 kg  01/20/21 73.5 kg     General:  Appears calm and comfortable Eyes: PERRL, normal lids, irises & conjunctiva ENT: grossly normal hearing, lips & tongue Neck: no masses Cardiovascular: RRR, no m/r/g. No LE edema. Telemetry: SR, no arrhythmias  Respiratory: CTA bilaterally, no w/r/r. Normal respiratory effort. Abdomen: soft, ntnd Skin: no rash or induration seen on limited exam Musculoskeletal: grossly normal tone BUE/BLE Psychiatric: grossly normal mood and affect, speech fluent and appropriate Neurologic: grossly non-focal.  Strength 5 out of 5 in upper and lower extremities bilaterally.  Negative pronator drift.  Negative Hoffmann sign bilaterally.          Labs on Admission:  Basic Metabolic Panel: Recent Labs  Lab 06/03/21 0943  NA 138  K 3.9  CL 106  CO2 23  GLUCOSE 174*  BUN 20  CREATININE 0.88  CALCIUM 9.2   Liver Function Tests: No results for input(s): AST, ALT, ALKPHOS, BILITOT, PROT, ALBUMIN in the last 168 hours. No results for input(s): LIPASE, AMYLASE in the last 168 hours. No results for input(s): AMMONIA in the last 168 hours. CBC: Recent Labs  Lab 06/03/21 0943  WBC 4.3  HGB 15.6  HCT 44.8  MCV 89.2  PLT 178   Cardiac Enzymes: No results for input(s): CKTOTAL, CKMB, CKMBINDEX, TROPONINI in the last 168 hours.  BNP (last 3 results) No results for input(s): BNP in the last 8760  hours.  ProBNP (last 3 results) No results for input(s): PROBNP in the last 8760 hours.  CBG: Recent Labs  Lab 06/03/21 1026  GLUCAP 149*    Radiological Exams on Admission: DG Chest Port 1 View  Result Date: 06/03/2021 CLINICAL DATA:  Near syncope. EXAM: PORTABLE CHEST 1 VIEW COMPARISON:  December 06, 2020. FINDINGS: The heart size and mediastinal contours are within normal limits. Both lungs are clear. Old right rib fractures are noted. IMPRESSION: No active disease. Electronically Signed   By: Marijo Conception M.D.   On: 06/03/2021 09:59   CT HEAD CODE STROKE WO CONTRAST  Result Date: 06/03/2021 CLINICAL DATA:  Code stroke.  Syncope today.  Left leg weakness. EXAM: CT HEAD WITHOUT CONTRAST TECHNIQUE: Contiguous axial images were obtained from the  base of the skull through the vertex without intravenous contrast. RADIATION DOSE REDUCTION: This exam was performed according to the departmental dose-optimization program which includes automated exposure control, adjustment of the mA and/or kV according to patient size and/or use of iterative reconstruction technique. COMPARISON:  CT head without contrast 12/06/2020. FINDINGS: Brain: No acute infarct, hemorrhage, or mass lesion is present. Moderate white matter disease is present. Remote left parietal infarct is stable. The ventricles are of normal size. No significant extraaxial fluid collection is present. The brainstem and cerebellum are within normal limits. Vascular: Atherosclerotic changes are present within the cavernous internal carotid arteries bilaterally. No asymmetric hyperdense vessel is present. Skull: Calvarium is intact. No focal lytic or blastic lesions are present. No significant extracranial soft tissue lesion is present. Sinuses/Orbits: The paranasal sinuses and mastoid air cells are clear. Bilateral lens replacements are noted. Globes and orbits are otherwise unremarkable. ASPECTS [facility] Stroke Program Early CT Score) - Ganglionic  level infarction (caudate, lentiform nuclei, internal capsule, insula, M1-M3 cortex): 7/7 - Supraganglionic infarction (M4-M6 cortex): 3/3 Total score (0-10 with 10 being normal): 10/10 IMPRESSION: 1. No acute intracranial abnormality or significant interval change. 2. Stable remote left parietal infarct. 3. Stable moderate white matter disease. This likely reflects the sequela of chronic microvascular ischemia. Electronically Signed   By: San Morelle M.D.   On: 06/03/2021 10:47    EKG: Independently reviewed.  Sinus rhythm, no ischemic changes.  Auto read reports old anterior infarct, I do not agree with this.  No change compared to prior.  Assessment/Plan Principal Problem:   TIA (transient ischemic attack) Active Problems:   Left-sided weakness   HTN (hypertension)   Diabetes (HCC)   Occipital stroke (HCC)   Arteriovenous malformation of cerebral vessels   Pseudoaneurysm of subclavian artery (HCC)   Pituitary adenoma (HCC)   Jon [name] is a 71 y.o. male with hx of parietal CVA, hypertension, diabetes, cerebral AVM, pituitary adenoma, pseudoaneurysm of subclavian artery, who presented this morning with sudden onset of left sided weakness and speech difficulty concerning for TIA vs stroke.   #TIA vs Stroke #Hx of parietal stroke Patient with benign neuro exam during my assessment however definite weakness noted by ED provider.  High risk for CVA given priors and lack of any preventative medications.  Review of chart shows last extensive work-up was done approximately 2 years ago in January 2021.  At that time MRI showed punctate acute infarction in the medial posterior left parietal lobe, no other acute changes.  CT angio head and neck was also obtained which showed mild to moderate stenosis but no critical stenosis.  Neurology recommendations are still pending at this time, I have reached out via secure chat.  Will order MRI head, defer CT angio head and neck at this time until  further neurology recommendations are available. - MRI brain without contrast - Echocardiogram - PT/OT/SLP eval's - Check A1c and lipid panel - Hold aspirin at this time - Follow-up neurology recommendations  #Chronic headache Patient reports 3 months of chronic headaches that have been worsening.  CT head without contrast without acute findings, will follow-up MRI.  Trial Flexeril and Tylenol for pain.  Emphasized to patient that he will need to establish a good relationship with his neurologist to help work on this problem more extensively in the outpatient setting.  Per review of chart he has not seen neurology since March 2021.  In the past he is felt to have a migraine disorder per review  of notes.  #Chronic medical problems DM2-not on any medications, last A1c was done in August 2022 and was 6.3%, will put on carb modified diet and await results of new A1c testing.  Hypertension-not taking amlodipine.  Initial vital signs with mild hypertension, but has subsequently been normotensive.  Continue to monitor.  Code Status: Full Code, confirmed DVT Prophylaxis: SCDs Family Communication: wife updated at bedside Disposition Plan: Obs, Med-Surg Tele   Time spent: 13 min  Clarnce Flock MD/MPH [facility]  Note:  This document was prepared using Systems analyst and may include unintentional dictation errors.

## 2021-06-04 ENCOUNTER — Encounter: Payer: Self-pay | Admitting: Family Medicine

## 2021-06-04 ENCOUNTER — Observation Stay: Admit: 2021-06-04 | Payer: Medicare Other

## 2021-06-04 ENCOUNTER — Observation Stay
Admit: 2021-06-04 | Discharge: 2021-06-04 | Disposition: A | Discharge status: Home / Self Care | Payer: Medicare Other | Attending: Family Medicine | Admitting: Family Medicine

## 2021-06-04 DIAGNOSIS — G459 Transient cerebral ischemic attack, unspecified: Secondary | ICD-10-CM | POA: Diagnosis not present

## 2021-06-04 DIAGNOSIS — I10 Essential (primary) hypertension: Secondary | ICD-10-CM | POA: Diagnosis not present

## 2021-06-04 LAB — ECHOCARDIOGRAM COMPLETE
AR max vel: 3.14 cm2
AV Area VTI: 3.51 cm2
AV Area mean vel: 3.32 cm2
AV Mean grad: 3 mmHg
AV Peak grad: 5.9 mmHg
Ao pk vel: 1.21 m/s
Area-P 1/2: 3.99 cm2
Height: 66 in
MV VTI: 3.09 cm2
S' Lateral: 3.14 cm
Weight: 2592.61 oz

## 2021-06-04 LAB — HEMOGLOBIN A1C
Hgb A1c MFr Bld: 6.9 % — ABNORMAL HIGH (ref 4.8–5.6)
Mean Plasma Glucose: 151 mg/dL

## 2021-06-04 MED ORDER — ROSUVASTATIN CALCIUM 10 MG PO TABS: 10.0000 mg | ORAL_TABLET | Freq: Every day | ORAL | 11 refills | Status: DC

## 2021-06-04 NOTE — Care Management Obs Status (Signed)
[place] NOTIFICATION   Patient Details  Name: Jon [name] MRN: [id number] Date of Birth: [birth year]   Medicare Observation Status Notification Given:  Yes    Shelbie Hutching, RN 06/04/2021, 11:24 AM

## 2021-06-04 NOTE — Progress Notes (Signed)
PT, OT, and Speech in today to work with patient.

## 2021-06-04 NOTE — ED Notes (Signed)
Informed RN bed assigned 

## 2021-06-04 NOTE — Evaluation (Signed)
Occupational Therapy Evaluation Patient Details Name: Jon [name] MRN: [id number] DOB: 02-10-1951 Today's Date: 06/04/2021   History of Present Illness Jon [name] is a 71 y.o. male with hx of occipital CVA, hypertension, diabetes, cerebral AVM, pituitary adenoma, pseudoaneurysm of subclavian artery, who presented this morning with sudden onset of left sided weakness and speech difficulty.   Clinical Impression   [name] presents with generalized weakness, L-sided weakness, impaired speech, poor standing balance, limited endurance, and headache. He lives with his wife and son in a single-story home, uses a [employer] on occasion, reports that he has "frequent" falls, drives, and is generally Mod I, although reports that he sometimes becomes incapacitated by severe headaches. During today's evaluation, pt reports 8/10 headache pain, speaks very slowly and has difficulty with word-finding, and is unable to maintain standing balance -- requiring 2-hand-assist, swaying when standing and needing to quickly return to seated position. Pt reports he knows that "something" is wrong but that he has been unable to find medical care that works for him -- he states that he does not know what the medications he was prescribed are for, that he has had allergic reactions to one or more of his medications but did not know which ones, so stopped taking all of them. Provided educ re: importance of communicating more clearly with health care providers, asking questions and writing down answers, following up with recommendations. Provided educ re: fall prevention techniques. Recommend ongoing OT while hospitalized, HHOT or outpt OT, depending on pt's transportation options.   Recommendations for follow up therapy are one component of a multi-disciplinary discharge planning process, led by the attending physician.  Recommendations may be updated based on patient status, additional functional criteria and insurance  authorization.   Follow Up Recommendations  Home health OT (If pt has transportation, could instead do outpatient OT)    Assistance Recommended at Discharge Intermittent Supervision/Assistance  Patient can return home with the following A little help with walking and/or transfers;A little help with bathing/dressing/bathroom    Functional Status Assessment  Patient has had a recent decline in their functional status and demonstrates the ability to make significant improvements in function in a reasonable and predictable amount of time.  Equipment Recommendations  None recommended by OT    Recommendations for Other Services       Precautions / Restrictions Precautions Precautions: Fall Restrictions Weight Bearing Restrictions: No      Mobility Bed Mobility Overal bed mobility: Modified Independent             General bed mobility comments: increased time, effort    Transfers Overall transfer level: Needs assistance Equipment used: 2 person hand held assist Transfers: Sit to/from Stand Sit to Stand: Supervision                  Balance Overall balance assessment: Needs assistance Sitting-balance support: Feet unsupported, Bilateral upper extremity supported     Postural control: Left lateral lean Standing balance support: Bilateral upper extremity supported Standing balance-Leahy Scale: Poor Standing balance comment: Unable to maintain standing balance -- had to revert to sitting after only a few seconds in standing                           ADL either performed or assessed with clinical judgement   ADL Overall ADL's : Needs assistance/impaired  Lower Body Dressing: Supervision/safety Lower Body Dressing Details (indicate cue type and reason): Requires increased time, effort for donning socks in sitting, but eventually able to do without physical assist. Had more difficulty with L LE than with R.                      Vision Patient Visual Report: No change from baseline       Perception     Praxis      Pertinent Vitals/Pain       Hand Dominance Right   Extremity/Trunk Assessment Upper Extremity Assessment Upper Extremity Assessment: Overall WFL for tasks assessed (L side weaker than R)   Lower Extremity Assessment Lower Extremity Assessment: Overall WFL for tasks assessed (L side weaker than R)   Cervical / Trunk Assessment Cervical / Trunk Assessment: Normal   Communication Communication Communication: No difficulties   Cognition Arousal/Alertness: Awake/alert Behavior During Therapy: WFL for tasks assessed/performed                                   General Comments: grumpy, pessimistic, states he knows "no one here is going to help me"     General Comments  B/l UE tremors. Slow speech, word-finding difficulty    Exercises Other Exercises Other Exercises: Educ re: medication mgmt, falls prevention   Shoulder Instructions      Home Living Family/patient expects to be discharged to:: Private residence Living Arrangements: Spouse/significant other;Children Available Help at Discharge: Family;Available PRN/intermittently Type of Home: House Home Access: Stairs to enter   Entrance Stairs-Rails: Right;Left;Can reach both Home Layout: One level     Bathroom Shower/Tub: Teacher, early years/pre: Standard Bathroom Accessibility: No   Home Equipment: Conservation officer, nature (2 wheels);Cane - single point          Prior Functioning/Environment Prior Level of Function : Driving;Needs assist             Mobility Comments: occasional use of SPC; reports "frequent" falls ADLs Comments: Mod I, but reports frequent headaches during which he must go to bed, becomes unable to care for himself        OT Problem List: Decreased strength;Impaired balance (sitting and/or standing);Pain;Decreased safety awareness;Decreased activity  tolerance;Decreased coordination;Decreased knowledge of use of DME or AE      OT Treatment/Interventions: Self-care/ADL training;DME and/or AE instruction;Therapeutic activities;Balance training;Therapeutic exercise;Energy conservation;Patient/family education    OT Goals(Current goals can be found in the care plan section) Acute Rehab OT Goals Patient Stated Goal: to get help for headaches OT Goal Formulation: With patient Time For Goal Achievement: 06/18/21 Potential to Achieve Goals: Good ADL Goals Pt Will Perform Lower Body Dressing: with modified independence;sitting/lateral leans Pt Will Transfer to Toilet: with modified independence;stand pivot transfer (using LRAD) Additional ADL Goal #1: Pt will identify/demonstrate 2+ falls prevention techniques  OT Frequency: Min 2X/week    Co-evaluation              AM-PAC OT "6 Clicks" Daily Activity     Outcome Measure Help from another person eating meals?: None Help from another person taking care of personal grooming?: A Little Help from another person toileting, which includes using toliet, bedpan, or urinal?: A Little Help from another person bathing (including washing, rinsing, drying)?: A Little Help from another person to put on and taking off regular upper body clothing?: A Little Help from another person to put on and  taking off regular lower body clothing?: A Lot 6 Click Score: 18   End of Session    Activity Tolerance: Other (comment);Patient limited by pain (Pt limited by headaches, impaired balance, lethargy) Patient left: in bed;with call bell/phone within reach  OT Visit Diagnosis: Unsteadiness on feet (R26.81);Other abnormalities of gait and mobility (R26.89);History of falling (Z91.81);Repeated falls (R29.6);Pain                Time: 7841-2820 OT Time Calculation (min): 45 min Charges:  OT General Charges $OT Visit: 1 Visit OT Evaluation $OT Eval Moderate Complexity: 1 Mod OT Treatments $Self Care/Home  Management : 38-52 mins Josiah Lobo, PhD, MS, OTR/L 06/04/21, 1:28 PM

## 2021-06-04 NOTE — TOC Progression Note (Signed)
Transition of Care [facility]) - Progression Note    Patient Details  Name: VISHRUTH SEOANE MRN: [id number] Date of Birth: [birth year]  Transition of Care [facility]) CM/SW Contact  Shelbie Hutching, RN Phone Number: 06/04/2021, 11:30 AM  Clinical Narrative:      Transition of Care [facility]) Screening Note   Patient Details  Name: ENMANUEL ZUFALL Date of Birth: 1950/12/25   Transition of Care [facility]) CM/SW Contact:    Shelbie Hutching, RN Phone Number: 06/04/2021, 11:30 AM    Transition of Care Department [facility]) has reviewed patient and no TOC needs have been identified at this time. We will continue to monitor patient advancement through interdisciplinary progression rounds. If new patient transition needs arise, please place a TOC consult.         Expected Discharge Plan and Services                                                 Social Determinants of Health (SDOH) Interventions    Readmission Risk Interventions No flowsheet data found.

## 2021-06-04 NOTE — Evaluation (Signed)
Physical Therapy Evaluation Patient Details Name: Jon [name] MRN: [id number] DOB: 07/04/1950 Today's Date: 06/04/2021  History of Present Illness  Jon [name] is a 71 y.o. male with hx of occipital CVA, hypertension, diabetes, cerebral AVM, pituitary adenoma, pseudoaneurysm of subclavian artery, who presented this morning with sudden onset of left sided weakness and speech difficulty.  Clinical Impression  Pt is a pleasant 71 year old male who was admitted for TIA. Pt performs bed mobility with mod I, transfers with supervision, and ambulation with supervision and RW.  Does complain of intense headache, RN admin meds during session. Pt demonstrates deficits with strength/mobility/balance. Pt typically uses SPC at baseline, however currently needs RW for balance and to maintain independence. Does endorse L hemibody weakness leg>arm. Decreased coordination noted with RAMP on B UE and L LE. Pt reports chronic numbness in L foot. Would benefit from skilled PT to address above deficits and promote optimal return to PLOF. Currently recommending OP PT for further follow up.   Recommendations for follow up therapy are one component of a multi-disciplinary discharge planning process, led by the attending physician.  Recommendations may be updated based on patient status, additional functional criteria and insurance authorization.  Follow Up Recommendations Outpatient PT    Assistance Recommended at Discharge Set up Supervision/Assistance  Patient can return home with the following  A little help with walking and/or transfers;Help with stairs or ramp for entrance    Equipment Recommendations None recommended by PT  Recommendations for Other Services       Functional Status Assessment Patient has had a recent decline in their functional status and demonstrates the ability to make significant improvements in function in a reasonable and predictable amount of time.     Precautions /  Restrictions Precautions Precautions: Fall Restrictions Weight Bearing Restrictions: No      Mobility  Bed Mobility Overal bed mobility: Modified Independent             General bed mobility comments: increased time, effort    Transfers Overall transfer level: Needs assistance Equipment used: Rolling walker (2 wheels) Transfers: Sit to/from Stand Sit to Stand: Supervision           General transfer comment: safe technique. Once standing, upright posture noted. Slight dizziness once standing    Ambulation/Gait Ambulation/Gait assistance: Supervision Gait Distance (Feet): 50 Feet Assistive device: Rolling walker (2 wheels) Gait Pattern/deviations: Step-through pattern       General Gait Details: able to ambulate safely within the room. Declined to go out into the hallway due to headache. No LOB, however generalized unsteadiness. Educated to continue use of RW for Doctor, hospital [facility] (Stroke Patients Only)       Balance Overall balance assessment: Needs assistance Sitting-balance support: Feet unsupported, Bilateral upper extremity supported Sitting balance-Leahy Scale: Good     Standing balance support: Bilateral upper extremity supported Standing balance-Leahy Scale: Fair                               Pertinent Vitals/Pain Pain Assessment Pain Assessment: 0-10 Pain Score: 5  Pain Location: headache Pain Descriptors / Indicators: Aching Pain Intervention(s): Limited activity within patient's tolerance    Home Living Family/patient expects to be discharged to:: Private residence Living Arrangements: Spouse/significant other;Children Available Help at Discharge: Family;Available PRN/intermittently Type of Home: House Home Access:  Stairs to enter Entrance Stairs-Rails: Right;Left;Can reach both Entrance Stairs-Number of Steps: 3   Home Layout: One level Home Equipment: Chartered certified accountant (2 wheels);Cane - single point      Prior Function Prior Level of Function : Driving;Needs assist             Mobility Comments: occasional use of SPC; reports "frequent" falls ADLs Comments: Mod I, but reports frequent headaches during which he must go to bed, becomes unable to care for himself     Hand Dominance   Dominant Hand: Right    Extremity/Trunk Assessment   Upper Extremity Assessment Upper Extremity Assessment: Defer to OT evaluation    Lower Extremity Assessment Lower Extremity Assessment:  (L LE grossly 3+/5; R LE grossly 4/5)    Cervical / Trunk Assessment Cervical / Trunk Assessment: Normal  Communication   Communication: No difficulties  Cognition Arousal/Alertness: Awake/alert Behavior During Therapy: WFL for tasks assessed/performed Overall Cognitive Status: Within Functional Limits for tasks assessed                                          General Comments General comments (skin integrity, edema, etc.): B LE/UE tremors during later portion of gait along with slight slurred speech.    Exercises     Assessment/Plan    PT Assessment Patient needs continued PT services  PT Problem List Decreased strength;Decreased balance;Decreased mobility;Decreased knowledge of use of DME;Decreased safety awareness;Impaired sensation;Pain       PT Treatment Interventions Gait training;DME instruction;Stair training;Therapeutic exercise;Balance training    PT Goals (Current goals can be found in the Care Plan section)  Acute Rehab PT Goals Patient Stated Goal: to go home PT Goal Formulation: With patient Time For Goal Achievement: 06/18/21 Potential to Achieve Goals: Good    Frequency Min 2X/week     Co-evaluation               AM-PAC PT "6 Clicks" Mobility  Outcome Measure Help needed turning from your back to your side while in a flat bed without using bedrails?: None Help needed moving from lying on your back to  sitting on the side of a flat bed without using bedrails?: None Help needed moving to and from a bed to a chair (including a wheelchair)?: A Little Help needed standing up from a chair using your arms (e.g., wheelchair or bedside chair)?: A Little Help needed to walk in hospital room?: A Little Help needed climbing 3-5 steps with a railing? : A Little 6 Click Score: 20    End of Session Equipment Utilized During Treatment: Gait belt Activity Tolerance: Patient tolerated treatment well Patient left: in bed Nurse Communication: Mobility status PT Visit Diagnosis: Unsteadiness on feet (R26.81);Muscle weakness (generalized) (M62.81);History of falling (Z91.81);Difficulty in walking, not elsewhere classified (R26.2);Pain Pain - Right/Left:  (head) Pain - part of body:  (head)    Time: 2951-8841 PT Time Calculation (min) (ACUTE ONLY): 17 min   Charges:   PT Evaluation $PT Eval Low Complexity: 1 Low PT Treatments $Gait Training: 8-22 mins        Greggory Stallion, PT, DPT, GCS [phone]   Roan Miklos 06/04/2021, 1:52 PM

## 2021-06-04 NOTE — TOC Transition Note (Signed)
Transition of Care [facility]) - CM/SW Discharge Note   Patient Details  Name: Jon [name] MRN: [id number] Date of Birth: 1951/03/17  Transition of Care [facility]) CM/SW Contact:  Shelbie Hutching, RN Phone Number: 06/04/2021, 2:30 PM   Clinical Narrative:    Patient to discharge home today.  Primary care MD can arrange for OP PT.           Patient Goals and CMS Choice        Discharge Placement                       Discharge Plan and Services                                     Social Determinants of Health (SDOH) Interventions     Readmission Risk Interventions No flowsheet data found.

## 2021-06-04 NOTE — Progress Notes (Signed)
SLP Cancellation Note  Patient Details Name: Jon [name] MRN: [id number] DOB: 11-25-1950   Cancelled treatment:       Reason Eval/Treat Not Completed: SLP screened, no needs identified, will sign off (chart reviewed; consulted NSG then met w/ pt in room) Pt denied any difficulty swallowing and is currently on a regular diet; tolerates swallowing pills w/ water per NSG. Pt conversed in conversation w/out overt expressive/receptive deficits noted; pt denied any current speech-language deficits. Speech clear. NSG reported a mild dysfluency -- intermittent, not consistent. Pt was given a menu; he discussed food items on menu appropriately.  No further skilled ST services indicated as pt appears at his baseline. Pt agreed. NSG to reconsult if any change in status while admitted.       Orinda Kenner, MS, CCC-SLP Speech Language Pathologist Rehab Services; [place] [phone] (ascom) Saanya Zieske 06/04/2021, 1:03 PM

## 2021-06-04 NOTE — Progress Notes (Signed)
*  PRELIMINARY RESULTS* Echocardiogram 2D Echocardiogram has been performed.  Jon [name] 06/04/2021, 11:13 AM

## 2021-06-04 NOTE — Discharge Summary (Signed)
Physician Discharge Summary  Patient ID: Jon [name] MRN: [id number] DOB/AGE: [birth year] 71 y.o.  Admit date: 06/03/2021 Discharge date: 06/04/2021  Admission Diagnoses:  Discharge Diagnoses:  Principal Problem:   TIA (transient ischemic attack) Active Problems:   Left-sided weakness   HTN (hypertension)   Diabetes ([place])   Occipital stroke ([place])   Arteriovenous malformation of cerebral vessels   Pseudoaneurysm of subclavian artery ([facility])   Pituitary adenoma ([place])   Discharged Condition: good  Hospital Course:    Jon [name] is a 71 y.o. male with hx of occipital CVA, hypertension, diabetes, cerebral AVM, pituitary adenoma, pseudoaneurysm of subclavian artery, who presented this morning with sudden onset of left sided weakness and speech difficulty. Patient is a seen by neurology, MRI of the brain did not show any acute stroke. Patient symptom had improved.  Discussed with Dr. Cheral Marker, aspirin is contraindicated as the patient had a prior history of bleeding from aspirin, chronic intracranial bleeding, and intracranial AVM. Patient did have significant dyslipidemia, will restart Crestor. Patient is advised to follow-up with PCP and neurology. Also follow-up with neurosurgery for intracranial AVM.  Consults: neurology  Significant Diagnostic Studies:  MRI/MRA  FINDINGS: MRI HEAD FINDINGS   Brain: There is no evidence of acute intracranial hemorrhage, extra-axial fluid collection, or acute infarct.   Background parenchymal volume is within normal limits. The ventricles are normal in size. A remote infarct in the left parietal lobe is unchanged. There are additional small remote infarcts in the bilateral occipital lobes and left cerebellar hemisphere again seen is a cystic lesion in the sella measuring up to 1.3 cm in the sagittal plane, unchanged since 2021. Scattered foci of FLAIR signal abnormality in the subcortical and periventricular white matter are  nonspecific but likely reflects sequela of chronic white matter microangiopathy.   There are multiple areas of curvilinear SWI signal dropout over both cerebral hemispheres, most notably involving the bilateral frontal and right parietal lobes, also present on the prior study from 2021 but increased in extent. Punctate chronic microhemorrhages are seen in the right parietal lobe and temporal lobe, unchanged.   There is an ill-defined tangle of enhancing vessels in the left temporal lobe measuring approximately 1.5 cm x 1.2 cm (27-81). A prominent vein is seen coursing away from this area of enhancement. This vein demonstrates arterial flow related enhancement on the concurrently obtained MRA. The appearance is overall similar to the prior studies including the CTA head from 05/12/2019 and MRA head from 08/19/2018   Vascular: The major flow voids are present. The vasculature is assessed in full below.   Skull and upper cervical spine: Normal marrow signal.   Sinuses/Orbits: The paranasal sinuses are clear. Bilateral lens implants are in place. The globes and orbits are otherwise unremarkable.   Other: None.   MRA HEAD FINDINGS   Anterior circulation: There is apparent moderate narrowing of the distal cervical/proximal petrous ICA on the right which may be artifactual. Otherwise, the intracranial ICAs are patent, without hemodynamically significant stenosis or occlusion.   The bilateral MCAs are patent.   The bilateral ACAs are patent. The anterior communicating artery is normal.   Again seen is an AVM in the left posterior temporal lobe with a prominent draining vein is again seen with the tangle of vessels measuring up to approximately 1.5 cm on the postcontrast images, unchanged. There is no aneurysm.   Posterior circulation: The bilateral V4 segments are patent. PICA is identified bilaterally. The basilar artery is patent.   The  bilateral PCAs are patent.   There  is no aneurysm or AVM.   Anatomic variants: None.   IMPRESSION: MR HEAD:   1. No evidence of acute intracranial hemorrhage or infarct. 2. Remote infarcts in the left parietal and bilateral occipital lobes, unchanged. 3. Numerous areas of old blood products overlying the bilateral cerebral hemispheres as above, overall increased in extent since most recent brain MRI from 05/12/2019. 4. 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   MRA HEAD   1. Apparent moderate stenosis of the distal right cervical/proximal petrous ICA may be in part artifactual. 2. Otherwise, patent intracranial vasculature with no hemodynamically significant stenosis or occlusion. 3. 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     Electronically Signed   By: Valetta Mole M.D.   On: 06/03/2021 16:42      Carotid U/S 1. Atherosclerotic disease involving bilateral carotid arteries. Estimated degree of stenosis in the internal carotid arteries is less than 50% bilaterally. 2. Patent vertebral arteries with antegrade flow.     Electronically Signed   By: Markus Daft M.D.   On: 06/03/2021 17:01  Echo:   1. Left ventricular ejection fraction, by estimation, is 55 to 60%. The  left ventricle has normal function. The left ventricle has no regional  wall motion abnormalities. Left ventricular diastolic parameters were  normal.   2. Right ventricular systolic function is normal. The right ventricular  size is normal.   3. The mitral valve is normal in structure. Trivial mitral valve  regurgitation.   4. The aortic valve is grossly normal. Aortic valve regurgitation is not  visualized.  Treatments: Stroke workup  Discharge Exam: Blood pressure (!) 107/56, pulse 61, temperature 97.9 F (36.6 C), temperature source Oral, resp. rate 12, height 5\' 6"  (1.676 m), weight 73.5 kg, SpO2 96 %. General appearance: alert and cooperative Resp: clear to auscultation  bilaterally Cardio: regular rate and rhythm, S1, S2 normal, no murmur, click, rub or gallop GI: soft, non-tender; bowel sounds normal; no masses,  no organomegaly Extremities: extremities normal, atraumatic, no cyanosis or edema  Disposition:    Allergies as of 06/04/2021       Reactions   Beta Adrenergic Blockers Itching   Topiramate Nausea And Vomiting   Other reaction(s): Dizziness, Hallucination Other Reaction: violence   Wasp Venom Anaphylaxis   Aspirin Other (See Comments)   Per pt's PCP due to spontaneous bleeding in his brain.   Benadryl [diphenhydramine] Hives   Lisinopril Itching   Other reaction(s): Other (See Comments) Leg pain   Nabumetone Other (See Comments)   Reaction: unknown   Valproic Acid Other (See Comments)   Reaction: Headache        Medication List     STOP taking these medications    amLODipine 5 MG tablet Commonly known as: NORVASC   DULoxetine 30 MG capsule Commonly known as: CYMBALTA       TAKE these medications    EPINEPHrine 0.3 mg/0.3 mL Soaj injection Commonly known as: EPI-PEN Inject 0.3 mg into the muscle as needed for anaphylaxis.   ibuprofen 200 MG tablet Commonly known as: ADVIL Take 200 mg by mouth every 6 (six) hours as needed for headache, fever, mild pain or moderate pain.   rosuvastatin 10 MG tablet Commonly known as: CRESTOR Take 1 tablet (10 mg total) by mouth daily.        Follow-up Information     Sparks, Leonie Douglas, MD Follow up  in 1 week(s).   Specialty: Internal Medicine Contact information: [place] [address] [phone]         Deetta Perla, MD Follow up in 4 week(s).   Specialty: Neurosurgery Contact information: [place] [address] [place] [facility] Follow up in 4 week(s).   Contact information: [place] [phone]                 Signed: Sharen Hones 06/04/2021, 1:19 PM

## 2021-11-17 IMAGING — MR MR HEAD W/O CM
12 series · 41 of 48 positions shown · non-contrast
Comparison: CT studies same day.  MRI 12/25/2018.

CLINICAL DATA: Acute presentation with unusual headache. Left arm
and leg weakness. History of pituitary tumor.

EXAM:
MRI HEAD WITHOUT CONTRAST
TECHNIQUE: Multiplanar, multiecho pulse sequences of the brain and surrounding
structures were obtained without intravenous contrast.

[Series 5: ax dwi_tracew · axial · 3.0mm · 0.60mm/px · z∈[-51,+104]mm · 4 of 48 slices shown]
[im 1/48]
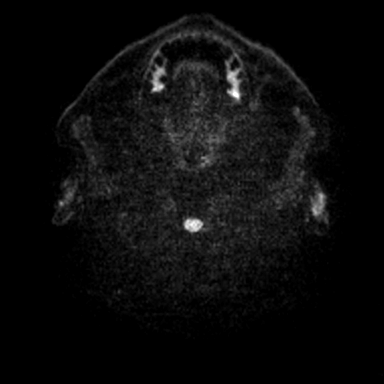
[im 16/48]
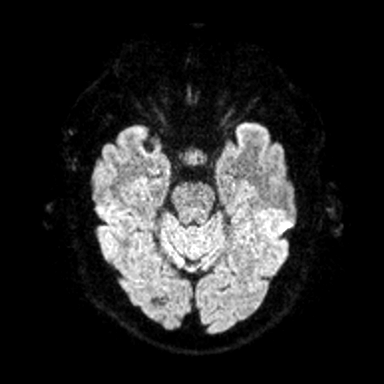
[im 32/48]
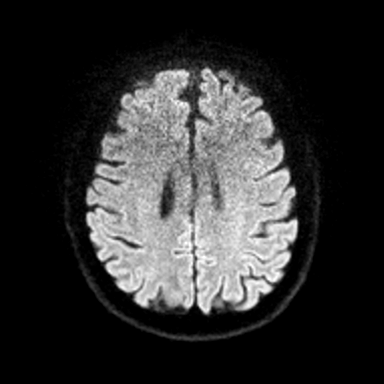
[im 48/48]
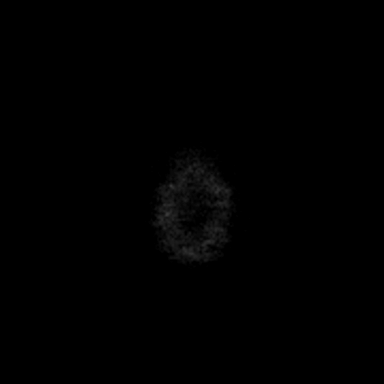

[Series 6: ax dwi_adc · axial · 3.0mm · 0.60mm/px · z∈[-51,+104]mm · 4 of 48 slices shown]
[im 1/48]
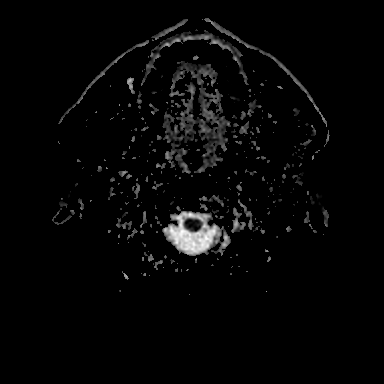
[im 16/48]
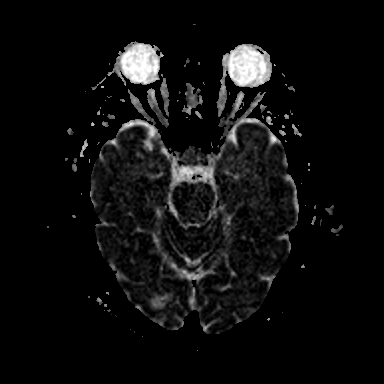
[im 32/48]
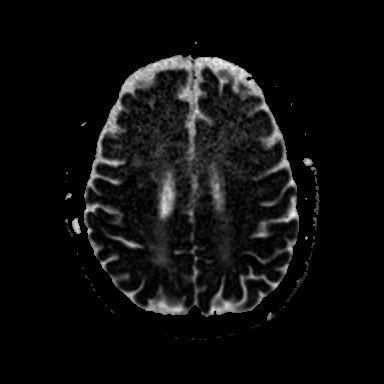
[im 48/48]
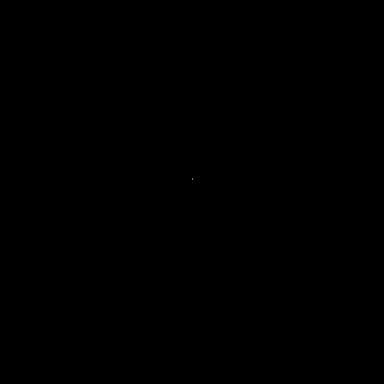

[Series 7: cor dwi_tracew · coronal · 5.0mm · 0.60mm/px · 3 of 38 slices shown]
[im 1/38]
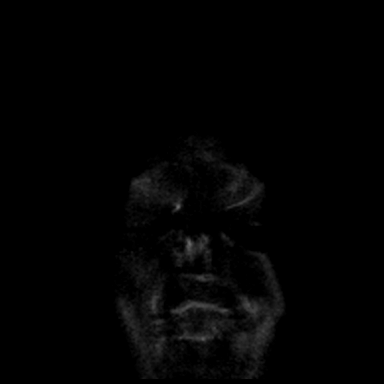
[im 19/38]
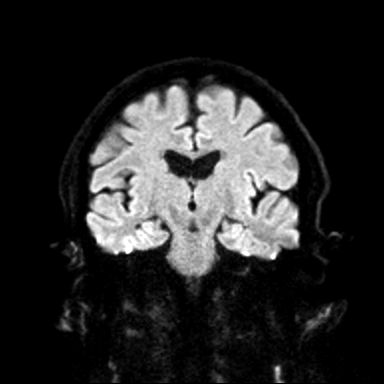
[im 38/38]
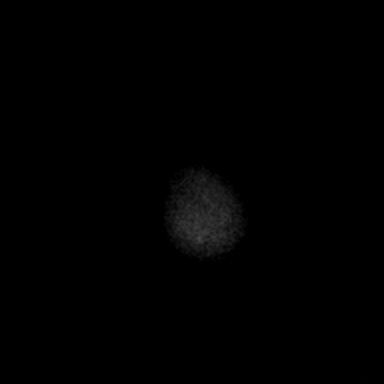

[Series 8: cor dwi_adc · coronal · 5.0mm · 0.60mm/px · 3 of 36 slices shown]
[im 1/36]
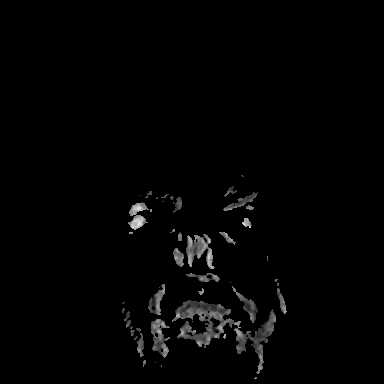
[im 18/36]
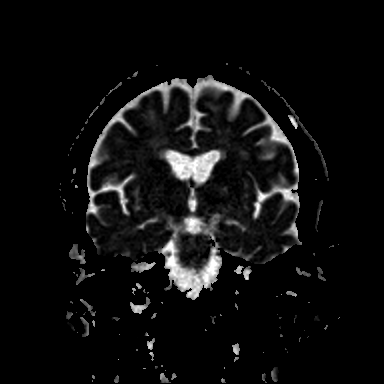
[im 36/36]
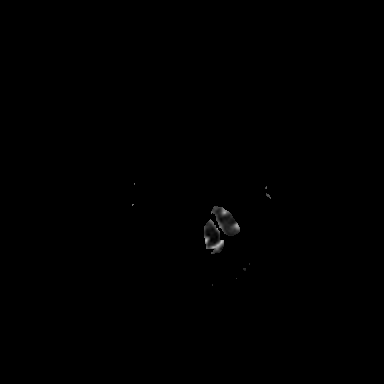

[Series 9: T1 · sagittal · 5.0mm · 0.62mm/px · 2 of 23 slices shown (1 of 2)]
[im 1/23]
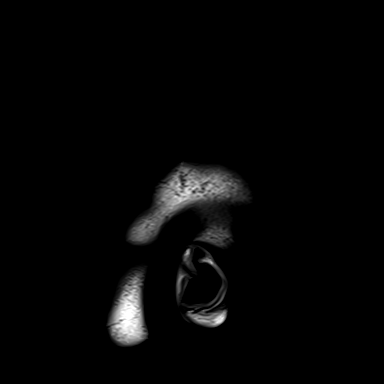
[im 23/23]
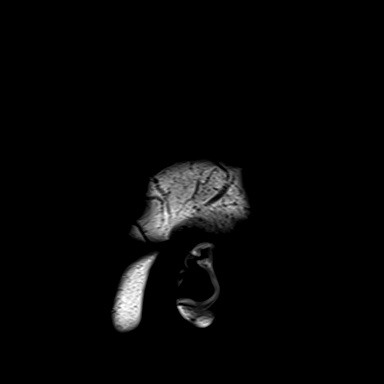

[Series 10: T2 · axial · 5.0mm · 0.53mm/px · z∈[-52,+104]mm · 2 of 27 slices shown (1 of 2)]
[im 1/27]
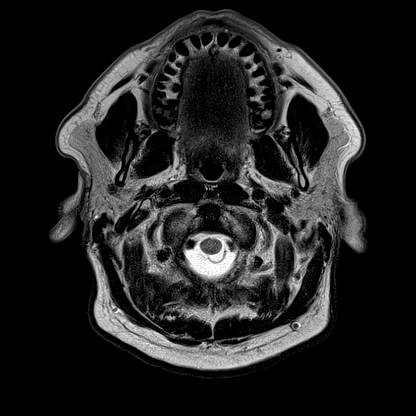
[im 27/27]
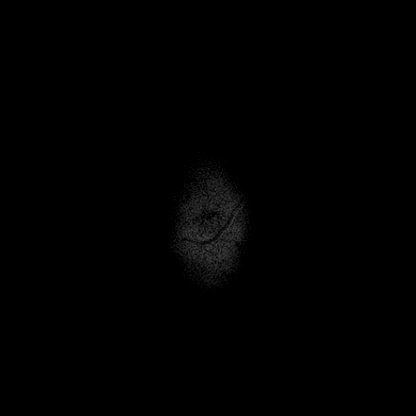

[Series 11: mag_images · axial · 3.0mm · 0.90mm/px · z∈[-63,+114]mm · 4 of 60 slices shown]
[im 1/60]
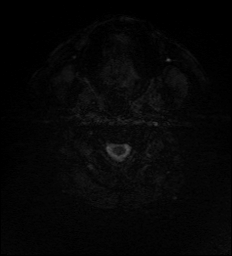
[im 20/60]
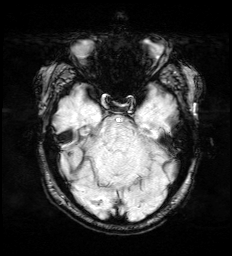
[im 40/60]
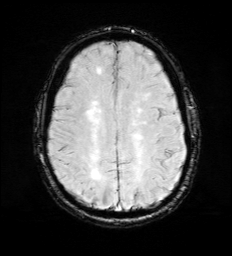
[im 60/60]
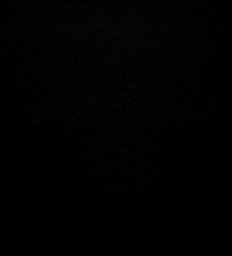

[Series 12: pha_images · axial · 3.0mm · 0.90mm/px · z∈[-63,+108]mm · 4 of 58 slices shown]
[im 1/58]
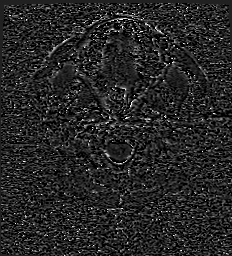
[im 20/58]
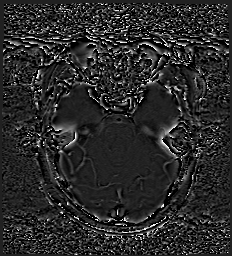
[im 39/58]
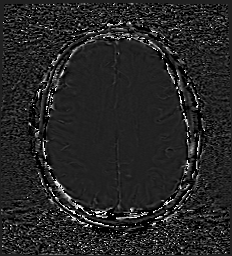
[im 58/58]
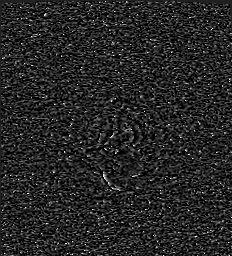

[Series 13: swi_images · axial · 3.0mm · 0.90mm/px · 1 of 60 slices shown]
[im 1/60]
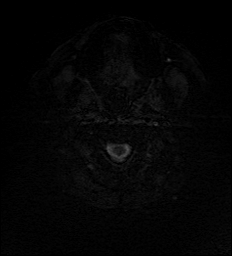

[Series 15: FLAIR · axial · 3.0mm · 0.53mm/px · z∈[-55,+107]mm · 4 of 55 slices shown]
[im 1/55]
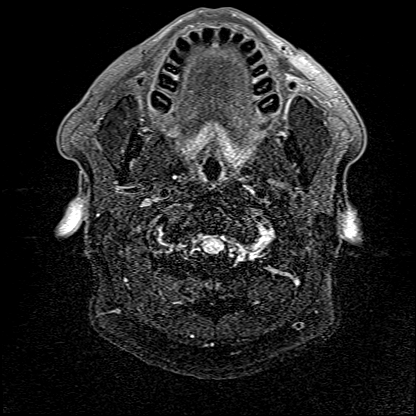
[im 19/55]
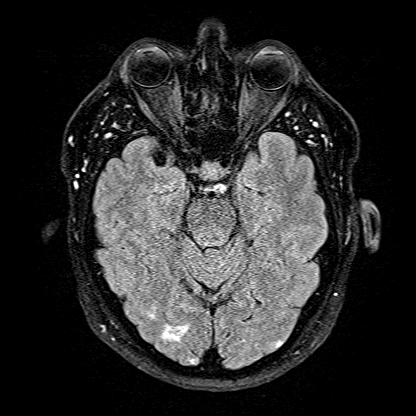
[im 37/55]
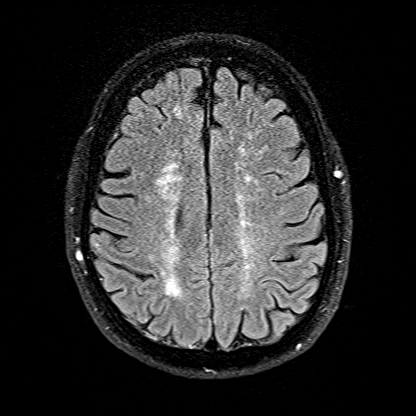
[im 55/55]
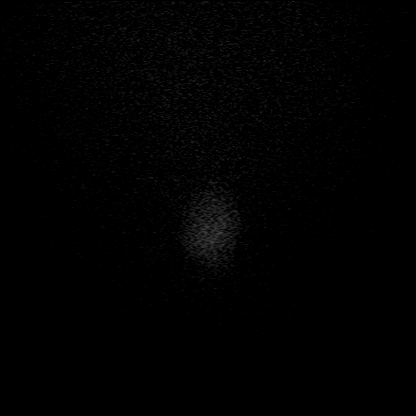

[Series 16: T1 · axial · 1.0mm · 0.98mm/px · z∈[-53,+106]mm · 8 of 160 slices shown (2 of 2)]
[im 1/160]
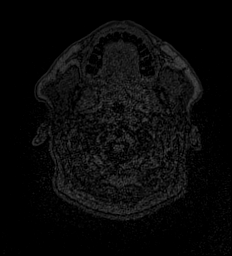
[im 29/160]
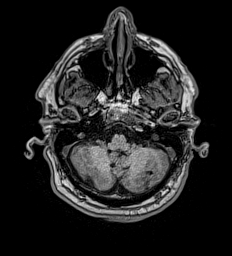
[im 44/160]
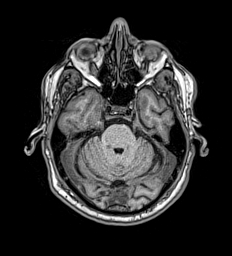
[im 73/160]
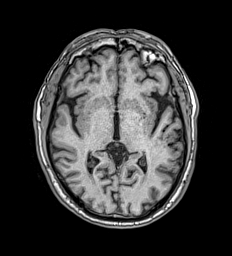
[im 87/160]
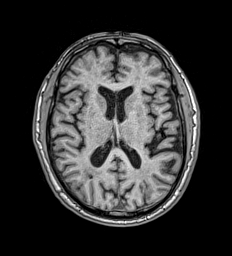
[im 116/160]
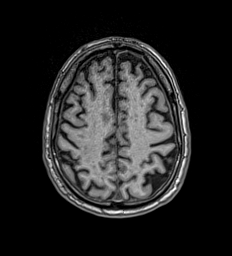
[im 131/160]
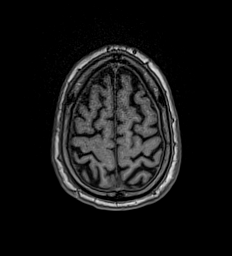
[im 160/160]
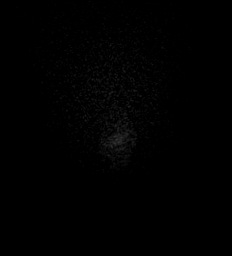

[Series 17: T2 · coronal · 5.0mm · 0.57mm/px · 2 of 28 slices shown (2 of 2)]
[im 1/28]
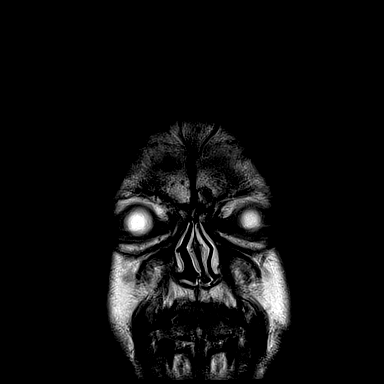
[im 28/28]
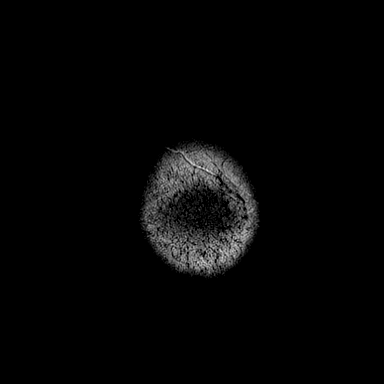

[41 of 48 positions shown; findings below may reference images not displayed]

FINDINGS: Brain: Diffusion imaging shows a punctate acute infarction in the
medial posterior parietal lobe on the left. No other acute or
subacute infarction.

No brainstem abnormality. Old small vessel infarction in the left
cerebellum. Cerebral hemispheres show an old infarction in the left
posterior parietal lobe with some hemosiderin deposition. This was
recent on the study [place]. Small old bilateral occipital
infarctions. Moderate to marked chronic small-vessel ischemic
changes elsewhere throughout the cerebral hemispheric white matter.
Old focus of hemosiderin deposition in the right frontal lobe.
Scattered other small foci of hemosiderin deposition associated with
some of the old infarctions. Chronic pial dural arteriovenous
malformation in the left lateral temporal lobe without evidence of
hemorrhage or change.

Chronic cystic pituitary adenoma measuring up to 13 mm in cephalo
caudal dimension. This is unchanged when compared to a study from
July 2017.

Vascular: Major vessels at the base of the brain show flow.

Skull and upper cervical spine: Negative

Sinuses/Orbits: Clear/normal

Other: None
IMPRESSION: Punctate acute infarction in the medial posterior left parietal
lobe.

Expected evolutionary changes cortical infarction in the left
posterior parietal lobe which was recent on the study [DATE].
Hemosiderin deposition in that region. Other small occipital
infarctions bilaterally and in the right frontal lobe as seen
previously. Extensive chronic small-vessel ischemic change
throughout the brain as outlined above, similar to the previous
studies. Numerous foci of hemosiderin deposition associated with
some of the old insults.

No change in a left lateral temporal pial dural arteriovenous
malformation without evidence of hemorrhage.

No change in a cystic pituitary macro adenoma measuring up to 13 mm.

## 2022-02-22 ENCOUNTER — Emergency Department
Admission: EM | Admit: 2022-02-22 | Discharge: 2022-02-22 | Disposition: A | Discharge status: Home / Self Care | Payer: Medicare Other | Attending: Emergency Medicine | Admitting: Emergency Medicine

## 2022-02-22 ENCOUNTER — Emergency Department: Payer: Medicare Other

## 2022-02-22 ENCOUNTER — Other Ambulatory Visit: Payer: Self-pay

## 2022-02-22 DIAGNOSIS — E119 Type 2 diabetes mellitus without complications: Secondary | ICD-10-CM | POA: Diagnosis not present

## 2022-02-22 DIAGNOSIS — R0602 Shortness of breath: Secondary | ICD-10-CM | POA: Insufficient documentation

## 2022-02-22 DIAGNOSIS — T782XXA Anaphylactic shock, unspecified, initial encounter: Secondary | ICD-10-CM | POA: Insufficient documentation

## 2022-02-22 DIAGNOSIS — T7840XA Allergy, unspecified, initial encounter: Secondary | ICD-10-CM | POA: Diagnosis present

## 2022-02-22 DIAGNOSIS — I10 Essential (primary) hypertension: Secondary | ICD-10-CM | POA: Diagnosis not present

## 2022-02-22 LAB — CBC WITH DIFFERENTIAL/PLATELET
Abs Immature Granulocytes: 0.01 10*3/uL (ref 0.00–0.07)
Basophils Absolute: 0 10*3/uL (ref 0.0–0.1)
Basophils Relative: 1 %
Eosinophils Absolute: 0.1 10*3/uL (ref 0.0–0.5)
Eosinophils Relative: 2 %
HCT: 47.4 % (ref 39.0–52.0)
Hemoglobin: 15.7 g/dL (ref 13.0–17.0)
Immature Granulocytes: 0 %
Lymphocytes Relative: 32 %
Lymphs Abs: 1.7 10*3/uL (ref 0.7–4.0)
MCH: 30.5 pg (ref 26.0–34.0)
MCHC: 33.1 g/dL (ref 30.0–36.0)
MCV: 92 fL (ref 80.0–100.0)
Monocytes Absolute: 0.4 10*3/uL (ref 0.1–1.0)
Monocytes Relative: 9 %
Neutro Abs: 2.9 10*3/uL (ref 1.7–7.7)
Neutrophils Relative %: 56 %
Platelets: 193 10*3/uL (ref 150–400)
RBC: 5.15 MIL/uL (ref 4.22–5.81)
RDW: 13.1 % (ref 11.5–15.5)
WBC: 5.1 10*3/uL (ref 4.0–10.5)
nRBC: 0 % (ref 0.0–0.2)

## 2022-02-22 LAB — COMPREHENSIVE METABOLIC PANEL
ALT: 23 U/L (ref 0–44)
AST: 24 U/L (ref 15–41)
Albumin: 4.8 g/dL (ref 3.5–5.0)
Alkaline Phosphatase: 71 U/L (ref 38–126)
Anion gap: 6 (ref 5–15)
BUN: 15 mg/dL (ref 8–23)
CO2: 26 mmol/L (ref 22–32)
Calcium: 9.1 mg/dL (ref 8.9–10.3)
Chloride: 106 mmol/L (ref 98–111)
Creatinine, Ser: 1.18 mg/dL (ref 0.61–1.24)
GFR, Estimated: >60 mL/min (ref >60)
Glucose, Bld: 208 mg/dL — ABNORMAL HIGH (ref 70–99)
Potassium: 3.8 mmol/L (ref 3.5–5.1)
Sodium: 138 mmol/L (ref 135–145)
Total Bilirubin: 0.9 mg/dL (ref 0.3–1.2)
Total Protein: 7.8 g/dL (ref 6.5–8.1)

## 2022-02-22 LAB — BLOOD GAS, VENOUS
Acid-Base Excess: 0.3 mmol/L (ref 0.0–2.0)
Bicarbonate: 24.6 mmol/L (ref 20.0–28.0)
O2 Saturation: 87.5 %
Patient temperature: 37
pCO2, Ven: 38 mmHg — ABNORMAL LOW (ref 44–60)
pH, Ven: 7.42 (ref 7.25–7.43)
pO2, Ven: 56 mmHg — ABNORMAL HIGH (ref 32–45)

## 2022-02-22 LAB — URINALYSIS, COMPLETE (UACMP) WITH MICROSCOPIC
Bilirubin Urine: NEGATIVE
Glucose, UA: 150 mg/dL — AB
Hgb urine dipstick: NEGATIVE
Ketones, ur: NEGATIVE mg/dL
Leukocytes,Ua: NEGATIVE
Nitrite: NEGATIVE
Protein, ur: NEGATIVE mg/dL
Specific Gravity, Urine: 1.016 (ref 1.005–1.030)
pH: 7 (ref 5.0–8.0)

## 2022-02-22 LAB — LACTIC ACID, PLASMA
Lactic Acid, Venous: 5.2 mmol/L (ref 0.5–1.9)
Lactic Acid, Venous: 3.3 mmol/L (ref 0.5–1.9)

## 2022-02-22 LAB — PROTIME-INR
INR: 0.9 (ref 0.8–1.2)
Prothrombin Time: 12.3 seconds (ref 11.4–15.2)

## 2022-02-22 LAB — TROPONIN I (HIGH SENSITIVITY): Troponin I (High Sensitivity): 13 ng/L (ref <18)

## 2022-02-22 LAB — APTT: aPTT: 32 seconds (ref 24–36)

## 2022-02-22 MED ORDER — ALBUTEROL SULFATE HFA 108 (90 BASE) MCG/ACT IN AERS: 2.0000 | INHALATION_SPRAY | Freq: Four times a day (QID) | RESPIRATORY_TRACT | 2 refills | Status: AC | PRN

## 2022-02-22 MED ORDER — SODIUM CHLORIDE 0.9 % IV BOLUS
1000.0000 mL | Freq: Once | INTRAVENOUS | Status: AC
  Administered 2022-02-22: 1000 mL via INTRAVENOUS

## 2022-02-22 MED ORDER — LACTATED RINGERS IV BOLUS (SEPSIS)
1000.0000 mL | Freq: Once | INTRAVENOUS | Status: AC
  Administered 2022-02-22: 1000 mL via INTRAVENOUS

## 2022-02-22 MED ORDER — PREDNISONE 50 MG PO TABS: ORAL_TABLET | ORAL | 0 refills | Status: DC

## 2022-02-22 MED ORDER — IOHEXOL 350 MG/ML SOLN
50.0000 mL | Freq: Once | INTRAVENOUS | Status: AC | PRN
  Administered 2022-02-22: 50 mL via INTRAVENOUS

## 2022-02-22 MED ORDER — IPRATROPIUM-ALBUTEROL 0.5-2.5 (3) MG/3ML IN SOLN
9.0000 mL | Freq: Once | RESPIRATORY_TRACT | Status: AC
  Administered 2022-02-22: 9 mL via RESPIRATORY_TRACT

## 2022-02-22 MED ORDER — METHYLPREDNISOLONE SODIUM SUCC 125 MG IJ SOLR
125.0000 mg | Freq: Once | INTRAMUSCULAR | Status: AC
  Administered 2022-02-22: 125 mg via INTRAVENOUS
  Filled 2022-02-22: qty 2

## 2022-02-22 MED ORDER — FAMOTIDINE IN NACL 20-0.9 MG/50ML-% IV SOLN
20.0000 mg | Freq: Once | INTRAVENOUS | Status: AC
  Administered 2022-02-22: 20 mg via INTRAVENOUS
  Filled 2022-02-22: qty 50

## 2022-02-22 MED ORDER — EPINEPHRINE 0.3 MG/0.3ML IJ SOAJ
0.3000 mg | Freq: Once | INTRAMUSCULAR | Status: AC
  Administered 2022-02-22: 0.3 mg via INTRAMUSCULAR

## 2022-02-22 MED ORDER — EPINEPHRINE 0.3 MG/0.3ML IJ SOAJ: 0.3000 mg | INTRAMUSCULAR | 0 refills | Status: AC | PRN

## 2022-02-22 NOTE — ED Provider Notes (Signed)
[facility] Provider Note    None    (approximate)   History   Allergic Reaction   HPI  Jon [name] is a 71 y.o. male   Past medical history of prior strokes with no residual deficits, AVMs of the brain, diabetes, hypertension, pituitary tumor presents to the emergency department with a question of allergic reaction after receiving an injection for carpal tunnel at clinic today.  He was in his regular state of health this morning per his wife who saw him around 8:15 in the morning before his appointment, reportedly at the clinic he was behaving normally with no complaints, received an injection of a steroid and lidocaine to his right wrist and after the appointment called his wife and seemed to be altered, not speaking very well and developed shortness of breath.  Arrives to the emergency department short of breath with diffuse wheezing, not speaking.  He got EpiPen, DuoNebs, Solu-Medrol, Pepcid and we did not give Benadryl due to prior allergic reaction Benadryl.  He felt better, stated that his breathing was improved, was able to speak in short phrases.  Denies pain.  Wife arrived at bedside to give collateral information states that he has had multiple strokes that presented similar with aphasia and altered mental status.  Neurological exam patient is globally weak but perhaps has some increased weakness to the left lower extremity compared to the right.   History was obtained via the patient, [facility] clinic, and the wife      Physical Exam   Triage Vital Signs: ED Triage Vitals  Enc Vitals Group     BP      Pulse      Resp      Temp      Temp src      SpO2      Weight      Height      Head Circumference      Peak Flow      Pain Score      Pain Loc      Pain Edu?      Excl. in [place]?     Most recent vital signs: Vitals:   02/22/22 1100 02/22/22 1200  BP: 139/77 (!) 148/75  Pulse: 93 96  Resp: 20 17  SpO2: 97% 97%     General: Awake, tremors, appears uncomfortable. CV:  Good peripheral perfusion.  Resp:  Tachypneic w wheezing throughout Abd:  No distention. Non tender Other:  Global weakness, moves all extremities, ?LLE weaker than right, no facial asymmetry and no rashes.    ED Results / Procedures / Treatments   Labs (all labs ordered are listed, but only abnormal results are displayed) Labs Reviewed  COMPREHENSIVE METABOLIC PANEL - Abnormal; Notable for the following components:      Result Value   Glucose, Bld 208 (*)    All other components within normal limits  BLOOD GAS, VENOUS - Abnormal; Notable for the following components:   pCO2, Ven 38 (*)    pO2, Ven 56 (*)    All other components within normal limits  CULTURE, BLOOD (ROUTINE X 2)  CULTURE, BLOOD (ROUTINE X 2)  CBC WITH DIFFERENTIAL/PLATELET  PROTIME-INR  APTT  LACTIC ACID, PLASMA  URINALYSIS, COMPLETE (UACMP) WITH MICROSCOPIC  TROPONIN I (HIGH SENSITIVITY)     I reviewed labs and they are notable for glucose 200  EKG  ED ECG REPORT I, Lucillie Garfinkel, the attending physician, personally viewed and interpreted this  ECG.   Date: 02/22/2022  EKG Time: 0958  Rate: 103  Rhythm: sinus tachycardia  Axis: normal  Intervals:none  ST&T Change: no stemi    RADIOLOGY I independently reviewed and interpreted cxr which reveals no obvious focal opacities or pneumothorax   PROCEDURES:  Critical Care performed: Yes, see critical care procedure note(s)  .Critical Care  Performed by: Lucillie Garfinkel, MD Authorized by: Lucillie Garfinkel, MD   Critical care provider statement:    Critical care time (minutes):  30   Critical care was necessary to treat or prevent imminent or life-threatening deterioration of the following conditions: Anaphylaxis.   Critical care was time spent personally by me on the following activities:  Development of treatment plan with patient or surrogate, discussions with consultants, evaluation of patient's  response to treatment, examination of patient, ordering and review of laboratory studies, ordering and review of radiographic studies, ordering and performing treatments and interventions, pulse oximetry, re-evaluation of patient's condition and review of old charts    [facility]: Medications  EPINEPHrine (EPI-PEN) injection 0.3 mg (0.3 mg Intramuscular Given 02/22/22 0946)  ipratropium-albuterol (DUONEB) 0.5-2.5 (3) MG/3ML nebulizer solution 9 mL (9 mLs Nebulization Given 02/22/22 0949)  famotidine (PEPCID) IVPB 20 mg premix (0 mg Intravenous Stopped 02/22/22 1148)  methylPREDNISolone sodium succinate (SOLU-MEDROL) 125 mg/2 mL injection 125 mg (125 mg Intravenous Given 02/22/22 1049)  lactated ringers bolus 1,000 mL (1,000 mLs Intravenous New Bag/Given 02/22/22 1049)  iohexol (OMNIPAQUE) 350 MG/ML injection 50 mL (50 mLs Intravenous Contrast Given 02/22/22 1039)     IMPRESSION / MDM / [place] / ED COURSE  I reviewed the triage vital signs and the nursing notes.                              Differential diagnosis includes, but is not limited to, allergic reaction, brain bleed or stroke, infection, metabolic derangement, less likely lidocaine toxicity.   The patient is on the cardiac monitor to evaluate for evidence of arrhythmia and/or significant heart rate changes.  MDM: Resolution of symptoms after allergic reaction medications, imaging of the head is negative.  Patient is back to baseline at this time.  He and his wife feel comfortable with discharge home at this time, observed for greater than 2 hours after the pen administration.  He will go home with a short course of prednisone burst, I reviewed his vital signs at this time and mild tachycardia 90-100s otherwise within normal limits safe for discharge home.  Return precautions given.   Patient's presentation is most consistent with acute presentation with potential threat to life or bodily  function.       FINAL CLINICAL IMPRESSION(S) / ED DIAGNOSES   Final diagnoses:  Allergic reaction, initial encounter  Anaphylaxis, initial encounter     Rx / DC Orders   ED Discharge Orders          Ordered    predniSONE (DELTASONE) 50 MG tablet        02/22/22 1215    albuterol (VENTOLIN HFA) 108 (90 Base) MCG/ACT inhaler  Every 6 hours PRN        02/22/22 1215    EPINEPHrine 0.3 mg/0.3 mL IJ SOAJ injection  As needed        02/22/22 1215             Note:  This document was prepared using Dragon voice recognition software and may include unintentional  dictation errors.    Lucillie Garfinkel, MD 02/22/22 [phone]

## 2022-02-22 NOTE — ED Notes (Signed)
MD Wong at bedside

## 2022-02-22 NOTE — ED Notes (Signed)
Pts wife has arrived to the room and told this RN that "this is exactly what happened the last time he had a stroke." Charge RN notified and MD to bedside. CT notified and ready for pt.

## 2022-02-22 NOTE — Discharge Instructions (Signed)
   Thank you for choosing us for your health care today!  Please see your primary doctor this week for a follow up appointment.   If you do not have a primary doctor call the following clinics to establish care:  If you have insurance:  [facility] [phone] [address] [place] [address]   [facility]  [phone] [address] [place] [address]   If you do not have insurance:  [facility]  [phone] [address] [place] [address]  Sometimes, in the early stages of certain disease courses it is difficult to detect in the emergency department evaluation -- so, it is important that you continue to monitor your symptoms and call your doctor right away or return to the emergency department if you develop any new or worsening symptoms.  It was my pleasure to care for you today.   Gennette Shadix S. Ridgely Anastacio, MD  

## 2022-02-22 NOTE — ED Notes (Signed)
Patient being bagged at this time

## 2022-02-27 LAB — CULTURE, BLOOD (ROUTINE X 2)
Culture: NO GROWTH
Culture: NO GROWTH
Special Requests: ADEQUATE

## 2022-06-08 ENCOUNTER — Other Ambulatory Visit: Payer: Self-pay

## 2022-06-08 ENCOUNTER — Emergency Department
Admission: EM | Admit: 2022-06-08 | Discharge: 2022-06-08 | Disposition: A | Discharge status: Home / Self Care | Payer: Medicare Other | Attending: Emergency Medicine | Admitting: Emergency Medicine

## 2022-06-08 ENCOUNTER — Emergency Department: Payer: Medicare Other

## 2022-06-08 DIAGNOSIS — Z8673 Personal history of transient ischemic attack (TIA), and cerebral infarction without residual deficits: Secondary | ICD-10-CM | POA: Diagnosis not present

## 2022-06-08 DIAGNOSIS — E119 Type 2 diabetes mellitus without complications: Secondary | ICD-10-CM | POA: Diagnosis not present

## 2022-06-08 DIAGNOSIS — I10 Essential (primary) hypertension: Secondary | ICD-10-CM | POA: Insufficient documentation

## 2022-06-08 DIAGNOSIS — R059 Cough, unspecified: Secondary | ICD-10-CM | POA: Diagnosis present

## 2022-06-08 DIAGNOSIS — E871 Hypo-osmolality and hyponatremia: Secondary | ICD-10-CM | POA: Diagnosis not present

## 2022-06-08 DIAGNOSIS — R051 Acute cough: Secondary | ICD-10-CM

## 2022-06-08 LAB — CBC
HCT: 41.2 % (ref 39.0–52.0)
Hemoglobin: 14 g/dL (ref 13.0–17.0)
MCH: 30.8 pg (ref 26.0–34.0)
MCHC: 34 g/dL (ref 30.0–36.0)
MCV: 90.7 fL (ref 80.0–100.0)
Platelets: 173 10*3/uL (ref 150–400)
RBC: 4.54 MIL/uL (ref 4.22–5.81)
RDW: 12.3 % (ref 11.5–15.5)
WBC: 6.1 10*3/uL (ref 4.0–10.5)
nRBC: 0 % (ref 0.0–0.2)

## 2022-06-08 LAB — COMPREHENSIVE METABOLIC PANEL
ALT: 50 U/L — ABNORMAL HIGH (ref 0–44)
AST: 34 U/L (ref 15–41)
Albumin: 3.9 g/dL (ref 3.5–5.0)
Alkaline Phosphatase: 64 U/L (ref 38–126)
Anion gap: 12 (ref 5–15)
BUN: 22 mg/dL (ref 8–23)
CO2: 23 mmol/L (ref 22–32)
Calcium: 8.5 mg/dL — ABNORMAL LOW (ref 8.9–10.3)
Chloride: 96 mmol/L — ABNORMAL LOW (ref 98–111)
Creatinine, Ser: 0.88 mg/dL (ref 0.61–1.24)
GFR, Estimated: >60 mL/min (ref >60)
Glucose, Bld: 277 mg/dL — ABNORMAL HIGH (ref 70–99)
Potassium: 3.4 mmol/L — ABNORMAL LOW (ref 3.5–5.1)
Sodium: 131 mmol/L — ABNORMAL LOW (ref 135–145)
Total Bilirubin: 1.2 mg/dL (ref 0.3–1.2)
Total Protein: 7 g/dL (ref 6.5–8.1)

## 2022-06-08 LAB — URINALYSIS, ROUTINE W REFLEX MICROSCOPIC
Bacteria, UA: NONE SEEN
Bilirubin Urine: NEGATIVE
Glucose, UA: >=500 mg/dL — AB
Hgb urine dipstick: NEGATIVE
Ketones, ur: 5 mg/dL — AB
Leukocytes,Ua: NEGATIVE
Nitrite: NEGATIVE
Protein, ur: 100 mg/dL — AB
Specific Gravity, Urine: 1.03 (ref 1.005–1.030)
pH: 5 (ref 5.0–8.0)

## 2022-06-08 LAB — LIPASE, BLOOD: Lipase: 37 U/L (ref 11–51)

## 2022-06-08 MED ORDER — AMOXICILLIN-POT CLAVULANATE 875-125 MG PO TABS: 1.0000 | ORAL_TABLET | Freq: Two times a day (BID) | ORAL | 0 refills | Status: AC

## 2022-06-08 MED ORDER — SODIUM CHLORIDE 0.9 % IV BOLUS
1000.0000 mL | Freq: Once | INTRAVENOUS | Status: AC
  Administered 2022-06-08: 1000 mL via INTRAVENOUS

## 2022-06-08 MED ORDER — DOXYCYCLINE MONOHYDRATE 100 MG PO TABS: 100.0000 mg | ORAL_TABLET | Freq: Two times a day (BID) | ORAL | 0 refills | Status: AC

## 2022-06-08 MED ORDER — SODIUM CHLORIDE 0.9 % IV BOLUS: 500.0000 mL | Freq: Once | INTRAVENOUS | Status: DC

## 2022-06-08 MED ORDER — ACETAMINOPHEN 500 MG PO TABS: 1000.0000 mg | ORAL_TABLET | Freq: Four times a day (QID) | ORAL | 2 refills | Status: AC | PRN

## 2022-06-08 NOTE — ED Triage Notes (Signed)
Pt family member sts that pt has not been feeling well since last week. Per family pt was dx with COVID and still is having intermittent fevers and cough with abd pain. Pt was sent over here by the UC.

## 2022-06-08 NOTE — ED Provider Notes (Signed)
[facility] Provider Note    Event Date/Time   First MD Initiated Contact with Patient 06/08/22 1221     (approximate)   History   Chief Complaint Cough   HPI Jon [name] is a 72 y.o. male, history of type 2 diabetes, GERD, prior stroke, pituitary adenoma, hypertension, hyperlipidemia, presents to the emergency department for evaluation of cough, fevers, abdominal pain x 1 week.  He was seen at urgent care initially on 06/03/2022, where he was diagnosed with COVID-19 and prescribed azithromycin, benzonatate, and prednisone (offered molnupiravir, but declined).  He presented to urgent care again today, where he was referred here to the emergency department for further evaluation.  He states overall his COVID symptoms have improved and is asymptomatic 2 days ago, however he is now back to feeling fatigued and coughing up "goop".  Additionally reports increased urinary frequency and lower abdominal pain.  Denies myalgias, chest pain, shortness of breath, abdominal pain, rash/lesions, dizziness/lightheadedness, nausea/vomiting,  History Limitations: No limitations.        Physical Exam  Triage Vital Signs: ED Triage Vitals [06/08/22 1155]  Enc Vitals Group     BP 135/84     Pulse Rate 84     Resp 17     Temp 98.6 F (37 C)     Temp Source Oral     SpO2 98 %     Weight 163 lb (73.9 kg)     Height      Head Circumference      Peak Flow      Pain Score 8     Pain Loc      Pain Edu?      Excl. in [place]?     Most recent vital signs: Vitals:   06/08/22 1155 06/08/22 1441  BP: 135/84 132/88  Pulse: 84 83  Resp: 17 15  Temp: 98.6 F (37 C) 98.5 F (36.9 C)  SpO2: 98% 98%    General: Awake, appears somewhat fatigued, but stable. Skin: Warm, dry. No rashes or lesions.  Eyes: PERRL. Conjunctivae normal.  CV: Good peripheral perfusion.  Resp: Normal effort.  Lung sounds are clear bilaterally.  Throat exam unremarkable. Abd: Soft, non-tender. No  distention.  Neuro: At baseline. No gross neurological deficits.  Musculoskeletal: Normal ROM of all extremities.  Physical Exam    ED Results / Procedures / Treatments  Labs (all labs ordered are listed, but only abnormal results are displayed) Labs Reviewed  COMPREHENSIVE METABOLIC PANEL - Abnormal; Notable for the following components:      Result Value   Sodium 131 (*)    Potassium 3.4 (*)    Chloride 96 (*)    Glucose, Bld 277 (*)    Calcium 8.5 (*)    ALT 50 (*)    All other components within normal limits  URINALYSIS, ROUTINE W REFLEX MICROSCOPIC - Abnormal; Notable for the following components:   Color, Urine YELLOW (*)    APPearance HAZY (*)    Glucose, UA >=500 (*)    Ketones, ur 5 (*)    Protein, ur 100 (*)    All other components within normal limits  LIPASE, BLOOD  CBC     EKG N/A.    RADIOLOGY  ED Provider Interpretation: I personally reviewed and interpreted this x-ray, no evidence of acute cardiopulmonary abnormalities.  DG Chest 2 View  Result Date: 06/08/2022 CLINICAL DATA:  Intermittent fevers and cough.  History of COVID. EXAM: CHEST - 2 VIEW  COMPARISON:  Chest x-ray 02/22/2022 FINDINGS: The cardiac silhouette, mediastinal and hilar contours are normal. The lungs are clear of an acute process. No infiltrates or effusions. The bony thorax is intact. Remote healed rib fractures are noted. IMPRESSION: No acute cardiopulmonary findings. Electronically Signed   By: Marijo Sanes M.D.   On: 06/08/2022 12:47    PROCEDURES:  Critical Care performed: N/A.  Procedures    MEDICATIONS ORDERED IN ED: Medications  sodium chloride 0.9 % bolus 1,000 mL (0 mLs Intravenous Stopped 06/08/22 1441)     IMPRESSION / MDM / ASSESSMENT AND PLAN / ED COURSE  I reviewed the triage vital signs and the nursing notes.                              Differential diagnosis includes, but is not limited to, COVID-19, RSV, influenza, community-acquired pneumonia,  bronchitis, cystitis, diverticulitis.   ED Course Patient appears well, vitals within normal limits.  NAD.  CMP shows hyponatremia at 131, likely pseudohyponatremia due to elevated glucose at 277.  No significant electrolyte abnormalities, AKI, or transaminitis.  Lipase unremarkable at 37.  Urinalysis shows elevated glucose and slight proteinuria, otherwise no evidence of infection.  Assessment/Plan Patient presents with cough, fever, and abdominal pain x 1 week in the setting of recent diagnosis of COVID-19.  He appears well clinically, vitals are within normal limits.  Lab workup appears reassuring, though he did have some elevated glucose and signs of dehydration on urinalysis.  Treated here with IV fluids.  Chest x-ray does not show any evidence of pneumonia, however given the recent improvement and now secondary worsening, suspect likely early development of community-acquired pneumonia.  Will provide him with a prescription for Augmentin and doxycycline.  In regards to his abdominal pain, he does not have any significant findings on exam to suggest any surgical pathology.  Possibly diverticulitis, as patient has a history of this.  The antibiotic therapy should cover for this.  Nonetheless, I do not believe CT imaging is indicated at this time.  Encouraged him to follow-up with his primary care provider within the next 48 hours to ensure that his symptoms are improving, not worsening.  He was amenable to this.  Will discharge.  Considered admission for this patient, but given his stable presentation and unremarkable workup, is unlikely benefit from admission.  Provided the patient with anticipatory guidance, return precautions, and educational material. Encouraged the patient to return to the emergency department at any time if they begin to experience any new or worsening symptoms. Patient expressed understanding and agreed with the plan.   Patient's presentation is most consistent with  acute complicated illness / injury requiring diagnostic workup.       FINAL CLINICAL IMPRESSION(S) / ED DIAGNOSES   Final diagnoses:  Acute cough     Rx / DC Orders   ED Discharge Orders          Ordered    amoxicillin-clavulanate (AUGMENTIN) 875-125 MG tablet  2 times daily        06/08/22 1426    doxycycline (ADOXA) 100 MG tablet  2 times daily        06/08/22 1426    acetaminophen (TYLENOL) 500 MG tablet  Every 6 hours PRN        06/08/22 1430             Note:  This document was prepared using Dragon voice recognition software and may  include unintentional dictation errors.   Teodoro Spray, [place] 06/08/22 1525    Lavonia Drafts, MD 06/08/22 [phone]

## 2022-06-08 NOTE — Discharge Instructions (Addendum)
-  Given the duration of your symptoms, I suspect that you likely have a pneumonia developing following your COVID-19 infection.  Please take the antibiotics as prescribed.  In addition, you may take Tylenol for the fever/body aches.  You may take other over-the-counter medications as needed as well.  Be sure to hydrate frequently throughout the day.  -Please follow-up with your primary care provider sometime within the next 48 hours for reevaluation.  -Return to the emergency department anytime if you begin to experience any new or worsening symptoms.

## 2022-07-06 ENCOUNTER — Other Ambulatory Visit: Payer: Self-pay | Admitting: Internal Medicine

## 2022-07-06 DIAGNOSIS — R053 Chronic cough: Secondary | ICD-10-CM

## 2022-07-06 DIAGNOSIS — I10 Essential (primary) hypertension: Secondary | ICD-10-CM

## 2022-07-15 ENCOUNTER — Ambulatory Visit
Admission: RE | Admit: 2022-07-15 | Discharge: 2022-07-15 | Disposition: A | Discharge status: Home / Self Care | Payer: Medicare Other | Source: Ambulatory Visit | Attending: Internal Medicine | Admitting: Internal Medicine

## 2022-07-15 DIAGNOSIS — I10 Essential (primary) hypertension: Secondary | ICD-10-CM

## 2022-07-15 DIAGNOSIS — R053 Chronic cough: Secondary | ICD-10-CM | POA: Insufficient documentation

## 2022-09-28 ENCOUNTER — Other Ambulatory Visit: Payer: Self-pay

## 2022-09-28 ENCOUNTER — Other Ambulatory Visit: Payer: Self-pay | Admitting: Internal Medicine

## 2022-09-28 DIAGNOSIS — R519 Headache, unspecified: Secondary | ICD-10-CM

## 2022-09-28 DIAGNOSIS — M7989 Other specified soft tissue disorders: Secondary | ICD-10-CM

## 2022-09-29 ENCOUNTER — Ambulatory Visit
Admission: RE | Admit: 2022-09-29 | Discharge: 2022-09-29 | Disposition: A | Discharge status: Home / Self Care | Payer: Medicare Other | Source: Ambulatory Visit | Attending: Internal Medicine | Admitting: Internal Medicine

## 2022-09-29 DIAGNOSIS — M7989 Other specified soft tissue disorders: Secondary | ICD-10-CM | POA: Insufficient documentation

## 2022-11-18 ENCOUNTER — Emergency Department: Payer: Medicare Other

## 2022-11-18 ENCOUNTER — Inpatient Hospital Stay: Payer: Medicare Other

## 2022-11-18 ENCOUNTER — Inpatient Hospital Stay
Admission: EM | Admit: 2022-11-18 | Discharge: 2022-11-20 | DRG: 065 | Disposition: A | Discharge status: Home / Self Care | Payer: Medicare Other | Attending: Internal Medicine | Admitting: Internal Medicine

## 2022-11-18 DIAGNOSIS — I69354 Hemiplegia and hemiparesis following cerebral infarction affecting left non-dominant side: Secondary | ICD-10-CM | POA: Diagnosis not present

## 2022-11-18 DIAGNOSIS — Z888 Allergy status to other drugs, medicaments and biological substances status: Secondary | ICD-10-CM

## 2022-11-18 DIAGNOSIS — R7989 Other specified abnormal findings of blood chemistry: Secondary | ICD-10-CM | POA: Diagnosis present

## 2022-11-18 DIAGNOSIS — E785 Hyperlipidemia, unspecified: Secondary | ICD-10-CM | POA: Diagnosis present

## 2022-11-18 DIAGNOSIS — I6389 Other cerebral infarction: Secondary | ICD-10-CM | POA: Diagnosis not present

## 2022-11-18 DIAGNOSIS — I10 Essential (primary) hypertension: Secondary | ICD-10-CM | POA: Diagnosis present

## 2022-11-18 DIAGNOSIS — R29710 NIHSS score 10: Secondary | ICD-10-CM | POA: Diagnosis present

## 2022-11-18 DIAGNOSIS — Z79899 Other long term (current) drug therapy: Secondary | ICD-10-CM

## 2022-11-18 DIAGNOSIS — I951 Orthostatic hypotension: Secondary | ICD-10-CM | POA: Diagnosis present

## 2022-11-18 DIAGNOSIS — E119 Type 2 diabetes mellitus without complications: Secondary | ICD-10-CM | POA: Diagnosis present

## 2022-11-18 DIAGNOSIS — G8194 Hemiplegia, unspecified affecting left nondominant side: Secondary | ICD-10-CM

## 2022-11-18 DIAGNOSIS — M6281 Muscle weakness (generalized): Principal | ICD-10-CM

## 2022-11-18 DIAGNOSIS — I63421 Cerebral infarction due to embolism of right anterior cerebral artery: Secondary | ICD-10-CM

## 2022-11-18 DIAGNOSIS — Z7951 Long term (current) use of inhaled steroids: Secondary | ICD-10-CM | POA: Diagnosis not present

## 2022-11-18 DIAGNOSIS — I6340 Cerebral infarction due to embolism of unspecified cerebral artery: Secondary | ICD-10-CM | POA: Diagnosis present

## 2022-11-18 DIAGNOSIS — I639 Cerebral infarction, unspecified: Secondary | ICD-10-CM | POA: Diagnosis present

## 2022-11-18 DIAGNOSIS — Z91030 Bee allergy status: Secondary | ICD-10-CM

## 2022-11-18 DIAGNOSIS — Z886 Allergy status to analgesic agent status: Secondary | ICD-10-CM | POA: Diagnosis not present

## 2022-11-18 DIAGNOSIS — Z7984 Long term (current) use of oral hypoglycemic drugs: Secondary | ICD-10-CM

## 2022-11-18 DIAGNOSIS — R29898 Other symptoms and signs involving the musculoskeletal system: Secondary | ICD-10-CM

## 2022-11-18 DIAGNOSIS — Z833 Family history of diabetes mellitus: Secondary | ICD-10-CM | POA: Diagnosis not present

## 2022-11-18 DIAGNOSIS — R471 Dysarthria and anarthria: Secondary | ICD-10-CM | POA: Diagnosis present

## 2022-11-18 DIAGNOSIS — R4182 Altered mental status, unspecified: Secondary | ICD-10-CM | POA: Diagnosis not present

## 2022-11-18 DIAGNOSIS — K219 Gastro-esophageal reflux disease without esophagitis: Secondary | ICD-10-CM | POA: Diagnosis present

## 2022-11-18 DIAGNOSIS — I63411 Cerebral infarction due to embolism of right middle cerebral artery: Secondary | ICD-10-CM | POA: Diagnosis not present

## 2022-11-18 DIAGNOSIS — E1159 Type 2 diabetes mellitus with other circulatory complications: Secondary | ICD-10-CM | POA: Diagnosis not present

## 2022-11-18 LAB — COMPREHENSIVE METABOLIC PANEL
ALT: 65 U/L — ABNORMAL HIGH (ref 0–44)
AST: 54 U/L — ABNORMAL HIGH (ref 15–41)
Albumin: 4.5 g/dL (ref 3.5–5.0)
Alkaline Phosphatase: 79 U/L (ref 38–126)
Anion gap: 10 (ref 5–15)
BUN: 18 mg/dL (ref 8–23)
CO2: 23 mmol/L (ref 22–32)
Calcium: 9.1 mg/dL (ref 8.9–10.3)
Chloride: 103 mmol/L (ref 98–111)
Creatinine, Ser: 0.85 mg/dL (ref 0.61–1.24)
GFR, Estimated: >60 mL/min (ref >60)
Glucose, Bld: 111 mg/dL — ABNORMAL HIGH (ref 70–99)
Potassium: 3.8 mmol/L (ref 3.5–5.1)
Sodium: 136 mmol/L (ref 135–145)
Total Bilirubin: 1 mg/dL (ref 0.3–1.2)
Total Protein: 6.9 g/dL (ref 6.5–8.1)

## 2022-11-18 LAB — CBC
HCT: 42 % (ref 39.0–52.0)
Hemoglobin: 14.5 g/dL (ref 13.0–17.0)
MCH: 31 pg (ref 26.0–34.0)
MCHC: 34.5 g/dL (ref 30.0–36.0)
MCV: 89.9 fL (ref 80.0–100.0)
Platelets: 162 10*3/uL (ref 150–400)
RBC: 4.67 MIL/uL (ref 4.22–5.81)
RDW: 12.6 % (ref 11.5–15.5)
WBC: 4.3 10*3/uL (ref 4.0–10.5)
nRBC: 0 % (ref 0.0–0.2)

## 2022-11-18 LAB — DIFFERENTIAL
Abs Immature Granulocytes: 0.02 10*3/uL (ref 0.00–0.07)
Basophils Absolute: 0 10*3/uL (ref 0.0–0.1)
Basophils Relative: 1 %
Eosinophils Absolute: 0.1 10*3/uL (ref 0.0–0.5)
Eosinophils Relative: 3 %
Immature Granulocytes: 1 %
Lymphocytes Relative: 29 %
Lymphs Abs: 1.2 10*3/uL (ref 0.7–4.0)
Monocytes Absolute: 0.3 10*3/uL (ref 0.1–1.0)
Monocytes Relative: 7 %
Neutro Abs: 2.6 10*3/uL (ref 1.7–7.7)
Neutrophils Relative %: 59 %

## 2022-11-18 LAB — CBG MONITORING, ED
Glucose-Capillary: 106 mg/dL — ABNORMAL HIGH (ref 70–99)
Glucose-Capillary: 211 mg/dL — ABNORMAL HIGH (ref 70–99)

## 2022-11-18 LAB — APTT: aPTT: 31 seconds (ref 24–36)

## 2022-11-18 LAB — ETHANOL: Alcohol, Ethyl (B): <10 mg/dL (ref <10)

## 2022-11-18 LAB — PROTIME-INR
INR: 1.1 (ref 0.8–1.2)
Prothrombin Time: 14 seconds (ref 11.4–15.2)

## 2022-11-18 MED ORDER — IOHEXOL 350 MG/ML SOLN
100.0000 mL | Freq: Once | INTRAVENOUS | Status: AC | PRN
  Administered 2022-11-18: 100 mL via INTRAVENOUS

## 2022-11-18 MED ORDER — INSULIN ASPART 100 UNIT/ML IJ SOLN
0.0000 [IU] | Freq: Three times a day (TID) | INTRAMUSCULAR | Status: DC
  Administered 2022-11-19 (×2): 2 [IU] via SUBCUTANEOUS
  Administered 2022-11-19: 5 [IU] via SUBCUTANEOUS
  Filled 2022-11-18 (×3): qty 1

## 2022-11-18 MED ORDER — CLONAZEPAM 0.5 MG PO TABS: 0.5000 mg | ORAL_TABLET | Freq: Two times a day (BID) | ORAL | Status: DC | PRN

## 2022-11-18 MED ORDER — METOCLOPRAMIDE HCL 5 MG/ML IJ SOLN
10.0000 mg | Freq: Once | INTRAMUSCULAR | Status: AC
  Administered 2022-11-18: 10 mg via INTRAVENOUS
  Filled 2022-11-18: qty 2

## 2022-11-18 MED ORDER — DIPHENHYDRAMINE HCL 50 MG/ML IJ SOLN
25.0000 mg | Freq: Once | INTRAMUSCULAR | Status: DC
  Filled 2022-11-18: qty 1

## 2022-11-18 MED ORDER — ENOXAPARIN SODIUM 40 MG/0.4ML IJ SOSY: 40.0000 mg | PREFILLED_SYRINGE | INTRAMUSCULAR | Status: DC

## 2022-11-18 MED ORDER — SODIUM CHLORIDE 0.9 % IV SOLN: INTRAVENOUS | Status: DC

## 2022-11-18 MED ORDER — DEXAMETHASONE SODIUM PHOSPHATE 10 MG/ML IJ SOLN
10.0000 mg | Freq: Once | INTRAMUSCULAR | Status: AC
  Administered 2022-11-18: 10 mg via INTRAVENOUS
  Filled 2022-11-18: qty 1

## 2022-11-18 MED ORDER — DULOXETINE HCL 30 MG PO CPEP
30.0000 mg | ORAL_CAPSULE | Freq: Every day | ORAL | Status: DC
  Administered 2022-11-19 – 2022-11-20 (×2): 30 mg via ORAL
  Filled 2022-11-18 (×2): qty 1

## 2022-11-18 MED ORDER — INSULIN ASPART 100 UNIT/ML IJ SOLN
0.0000 [IU] | Freq: Every day | INTRAMUSCULAR | Status: DC
  Administered 2022-11-18: 2 [IU] via SUBCUTANEOUS
  Filled 2022-11-18: qty 1

## 2022-11-18 MED ORDER — ACETAMINOPHEN 160 MG/5ML PO SOLN: 650.0000 mg | ORAL | Status: DC | PRN

## 2022-11-18 MED ORDER — DIPHENHYDRAMINE HCL 50 MG/ML IJ SOLN: 25.0000 mg | Freq: Once | INTRAMUSCULAR | Status: DC

## 2022-11-18 MED ORDER — SODIUM CHLORIDE 0.9% FLUSH
3.0000 mL | Freq: Once | INTRAVENOUS | Status: AC
  Administered 2022-11-18: 3 mL via INTRAVENOUS

## 2022-11-18 MED ORDER — ATORVASTATIN CALCIUM 20 MG PO TABS
80.0000 mg | ORAL_TABLET | Freq: Every day | ORAL | Status: DC
  Administered 2022-11-19 – 2022-11-20 (×2): 80 mg via ORAL
  Filled 2022-11-18 (×2): qty 4

## 2022-11-18 MED ORDER — DEXTROSE IN LACTATED RINGERS 5 % IV SOLN: INTRAVENOUS | Status: DC

## 2022-11-18 MED ORDER — EZETIMIBE 10 MG PO TABS
10.0000 mg | ORAL_TABLET | Freq: Every day | ORAL | Status: DC
  Administered 2022-11-19 – 2022-11-20 (×2): 10 mg via ORAL
  Filled 2022-11-18 (×2): qty 1

## 2022-11-18 MED ORDER — KETOROLAC TROMETHAMINE 15 MG/ML IJ SOLN
15.0000 mg | Freq: Once | INTRAMUSCULAR | Status: AC
  Administered 2022-11-18: 15 mg via INTRAVENOUS
  Filled 2022-11-18: qty 1

## 2022-11-18 MED ORDER — SENNOSIDES-DOCUSATE SODIUM 8.6-50 MG PO TABS: 1.0000 | ORAL_TABLET | Freq: Every evening | ORAL | Status: DC | PRN

## 2022-11-18 MED ORDER — ACETAMINOPHEN 325 MG PO TABS
650.0000 mg | ORAL_TABLET | ORAL | Status: DC | PRN
  Administered 2022-11-19 (×2): 650 mg via ORAL
  Filled 2022-11-18 (×2): qty 2

## 2022-11-18 MED ORDER — MOMETASONE FURO-FORMOTEROL FUM 200-5 MCG/ACT IN AERO
2.0000 | INHALATION_SPRAY | Freq: Two times a day (BID) | RESPIRATORY_TRACT | Status: DC
  Administered 2022-11-18 – 2022-11-20 (×3): 2 via RESPIRATORY_TRACT
  Filled 2022-11-18 (×2): qty 8.8

## 2022-11-18 MED ORDER — VALPROATE SODIUM 100 MG/ML IV SOLN
1000.0000 mg | Freq: Once | INTRAVENOUS | Status: AC
  Administered 2022-11-18: 1000 mg via INTRAVENOUS
  Filled 2022-11-18: qty 10

## 2022-11-18 MED ORDER — GADOBUTROL 1 MMOL/ML IV SOLN
8.0000 mL | Freq: Once | INTRAVENOUS | Status: AC | PRN
  Administered 2022-11-18: 8 mL via INTRAVENOUS

## 2022-11-18 MED ORDER — TRAZODONE HCL 50 MG PO TABS
100.0000 mg | ORAL_TABLET | Freq: Every day | ORAL | Status: DC
  Administered 2022-11-19: 100 mg via ORAL
  Filled 2022-11-18: qty 2

## 2022-11-18 MED ORDER — PANTOPRAZOLE SODIUM 40 MG PO TBEC
40.0000 mg | DELAYED_RELEASE_TABLET | Freq: Every day | ORAL | Status: DC
  Administered 2022-11-19 – 2022-11-20 (×2): 40 mg via ORAL
  Filled 2022-11-18 (×2): qty 1

## 2022-11-18 MED ORDER — MAGNESIUM SULFATE 2 GM/50ML IV SOLN
2.0000 g | Freq: Once | INTRAVENOUS | Status: AC
  Administered 2022-11-18: 2 g via INTRAVENOUS
  Filled 2022-11-18: qty 50

## 2022-11-18 MED ORDER — SODIUM CHLORIDE 0.9 % IV BOLUS
1000.0000 mL | Freq: Once | INTRAVENOUS | Status: AC
  Administered 2022-11-18: 1000 mL via INTRAVENOUS

## 2022-11-18 MED ORDER — ALBUTEROL SULFATE (2.5 MG/3ML) 0.083% IN NEBU: 2.5000 mg | INHALATION_SOLUTION | Freq: Four times a day (QID) | RESPIRATORY_TRACT | Status: DC | PRN

## 2022-11-18 MED ORDER — STROKE: EARLY STAGES OF RECOVERY BOOK: Freq: Once | Status: AC

## 2022-11-18 MED ORDER — ACETAMINOPHEN 650 MG RE SUPP: 650.0000 mg | RECTAL | Status: DC | PRN

## 2022-11-18 NOTE — Code Documentation (Signed)
Stroke Response Nurse Documentation Code Documentation  Jon [name] is a 72 y.o. male arriving to [facility] via [facility] on 11/18/2022 with past medical hx of TIA, HTN, DM, pituitary tumor, AVM. On No antithrombotic. Code stroke was activated by EMS.   Patient from home where he was LKW yesterday 7/10 and now complaining of left sided weakness. Patient's wife reports symptoms started yesterday with his left leg starting to drag. Today he called his wife around 1430, symptoms worsened with   upper and lower left sided weakness and numbness. EMS was called. Patient endorses dizziness while standing and left sided headache, specifically around the left eye.   Stroke team at the bedside on patient arrival. Labs drawn and patient cleared for CT by Dr. Vicente Males. Patient to CT with team. NIHSS 10, see documentation for details and code stroke times. Patient with left facial droop, bilateral, left arm weakness, left leg weakness, right limb ataxia, left decreased sensation, and Expressive aphasia  on exam. The following imaging was completed:  CT Head and CTA. Patient is not a candidate for IV Thrombolytic due to outside window, per MD. Patient is not a candidate for IR due to LVO not seen on imaging, per MD.   Care Plan: every 2 hour NIHSS and vital signs, swallow screen per provider.   Bedside handoff with ED RN Jon [name].    Jon [name]  Stroke Response RN

## 2022-11-18 NOTE — H&P (Addendum)
History and Physical    Patient: Jon [name] NGE:[id number] DOB: 12-Apr-1951 DOA: 11/18/2022 DOS: the patient was seen and examined on 11/18/2022 PCP: Marguarite Arbour, MD  Patient coming from: Home  Chief Complaint:  Chief Complaint  Patient presents with   Stroke Symptoms   HPI: Jon [name] is a 72 y.o. male with medical history significant of remote CVA with chornic right hemiparesis. However, patient has excellent functional capacity and is mobile.  Patient was in his usual state of health until yesterday afternoon at approximately 2 PM when patient had a new onset of left arm and lower extremity weakness as well as numbness.  As well as numbness on the left side of his face.  This was associated with a change in patient's manner of speech which was best described slightly dysarthric.  There is no report of patient having loss of consciousness or tremor.  Patient does not report any vision changes or difficulty swallowing.  Patient's wife is at the bedside and is a secondary historian.  It is not entirely clear to me why patient did not seek medical attention yesterday, however patient came to the [facility] ER today because of his persistent symptoms as above.  ER course is notable for no new neurologic signs developing.  However patient has remained persistently slightly dysarthric as well as some weakness of the left leg.  Workup as below.  Medical evaluation is sought Review of Systems: As mentioned in the history of present illness. All other systems reviewed and are negative. Past Medical History:  Diagnosis Date   AVM (arteriovenous malformation) brain    Diabetes mellitus without complication (HCC)    Hypertension    Pituitary tumor    TIA (transient ischemic attack)   Family describes patient having a contraindication to anticoagulation therapy because of his AV malformation with concern of prior spontaneous bleeding Past Surgical History:  Procedure Laterality Date    COLONOSCOPY     EYE SURGERY     HERNIA REPAIR     Social History:  reports that he has never smoked. He has never used smokeless tobacco. He reports that he does not drink alcohol and does not use drugs.  Allergies  Allergen Reactions   Beta Adrenergic Blockers Itching   Topiramate Nausea And Vomiting    Other reaction(s): Dizziness, Hallucination Other Reaction: violence   Wasp Venom Anaphylaxis   Aspirin Other (See Comments)    Per pt's PCP due to spontaneous bleeding in his brain.   Benadryl [Diphenhydramine] Hives   Lisinopril Itching    Other reaction(s): Other (See Comments) Leg pain   Nabumetone Other (See Comments)    Reaction: unknown   Valproic Acid Other (See Comments)    Reaction: Headache    Family History  Problem Relation Age of Onset   Diabetes Mellitus II Other        Maternal aunt    Prior to Admission medications   Medication Sig Start Date End Date Taking? Authorizing Provider  acetaminophen (TYLENOL) 500 MG tablet Take 2 tablets (1,000 mg total) by mouth every 6 (six) hours as needed. 06/08/22 06/08/23 Yes Varney Daily, PA  albuterol (VENTOLIN HFA) 108 (90 Base) MCG/ACT inhaler Inhale 2 puffs into the lungs every 6 (six) hours as needed for wheezing or shortness of breath. 02/22/22  Yes Pilar Jarvis, MD  amLODipine (NORVASC) 5 MG tablet Take 5 mg by mouth daily.   Yes [provider]  clonazePAM (KLONOPIN) 0.5 MG tablet Take 0.5  mg by mouth 2 (two) times daily as needed. 08/17/22  Yes [provider]  DULoxetine (CYMBALTA) 30 MG capsule Take 30 mg by mouth daily. 06/30/22  Yes [provider]  EPINEPHrine 0.3 mg/0.3 mL IJ SOAJ injection Inject 0.3 mg into the muscle as needed for anaphylaxis. 02/22/22  Yes Pilar Jarvis, MD  ezetimibe (ZETIA) 10 MG tablet Take 10 mg by mouth daily.   Yes [provider]  fluticasone-salmeterol (ADVAIR) 250-50 MCG/ACT AEPB Inhale 1 puff into the lungs in the morning and at bedtime.  06/30/22 06/30/23 Yes [provider]  glipiZIDE (GLUCOTROL XL) 10 MG 24 hr tablet Take 10 mg by mouth daily.   Yes [provider]  ibuprofen (ADVIL) 200 MG tablet Take 200 mg by mouth every 6 (six) hours as needed for headache, fever, mild pain or moderate pain.   Yes [provider]  pantoprazole (PROTONIX) 40 MG tablet Take 40 mg by mouth daily. 08/11/22  Yes [provider]  traZODone (DESYREL) 100 MG tablet Take 100 mg by mouth at bedtime. 10/29/22  Yes [provider]  predniSONE (DELTASONE) 50 MG tablet Take one tablet daily for 3 days starting on Tuesday 02/23/2022 Patient not taking: Reported on 11/18/2022 02/23/22   Pilar Jarvis, MD  rosuvastatin (CRESTOR) 10 MG tablet Take 1 tablet (10 mg total) by mouth daily. 06/04/21 06/04/22  Marrion Coy, MD  testosterone cypionate (DEPOTESTOSTERONE CYPIONATE) 200 MG/ML injection Inject 200 mg into the muscle every 28 (twenty-eight) days. Patient not taking: Reported on 11/18/2022    [provider]    Physical Exam: Vitals:   11/18/22 1558 11/18/22 1600  BP: (!) 163/90   Pulse: 75   Resp: (!) 22   Temp: 97.9 F (36.6 C)   TempSrc: Oral   SpO2: 99%   Weight:  82.8 kg  Height:  5\' 6"  (1.676 m)   General: Patient is not distressed, is sleeping but easily arousable and is coherent.  However wife at the bedside and patient defers loss of history to the wife Respiratory exam: Bilateral intravesicular Cardiovascular exam S1-S2 normal Abdomen all quadrants soft nontender Extremities warm without edema Neck movements are free Bilateral upper extremity strength is felt to be 5/5 versus symmetrical both sides.  Left lower extremity strength is felt to be 4+/5 as patient is barely able to maintain antigravity strength.  There is no skin changes.  Patient has preserved soft touch sensation to all 4 extremities.  Facies is felt to be symmetric.  Patient's speech is dysarthric slightly.  Visual fields are  preserved by confrontation Data Reviewed:  Labs on Admission:  Results for orders placed or performed during the hospital encounter of 11/18/22 (from the past 24 hour(s))  CBG monitoring, ED     Status: Abnormal   Collection Time: 11/18/22  3:27 PM  Result Value Ref Range   Glucose-Capillary 106 (H) 70 - 99 mg/dL   Comment 1 Notify RN    Comment 2 Document in Chart   Protime-INR     Status: None   Collection Time: 11/18/22  4:03 PM  Result Value Ref Range   Prothrombin Time 14.0 11.4 - 15.2 seconds   INR 1.1 0.8 - 1.2  APTT     Status: None   Collection Time: 11/18/22  4:03 PM  Result Value Ref Range   aPTT 31 24 - 36 seconds  CBC     Status: None   Collection Time: 11/18/22  4:03 PM  Result Value Ref  Range   WBC 4.3 4.0 - 10.5 K/uL   RBC 4.67 4.22 - 5.81 MIL/uL   Hemoglobin 14.5 13.0 - 17.0 g/dL   HCT 40.9 81.1 - 91.4 %   MCV 89.9 80.0 - 100.0 fL   MCH 31.0 26.0 - 34.0 pg   MCHC 34.5 30.0 - 36.0 g/dL   RDW 78.2 95.6 - 21.3 %   Platelets 162 150 - 400 K/uL   nRBC 0.0 0.0 - 0.2 %  Differential     Status: None   Collection Time: 11/18/22  4:03 PM  Result Value Ref Range   Neutrophils Relative % 59 %   Neutro Abs 2.6 1.7 - 7.7 K/uL   Lymphocytes Relative 29 %   Lymphs Abs 1.2 0.7 - 4.0 K/uL   Monocytes Relative 7 %   Monocytes Absolute 0.3 0.1 - 1.0 K/uL   Eosinophils Relative 3 %   Eosinophils Absolute 0.1 0.0 - 0.5 K/uL   Basophils Relative 1 %   Basophils Absolute 0.0 0.0 - 0.1 K/uL   Immature Granulocytes 1 %   Abs Immature Granulocytes 0.02 0.00 - 0.07 K/uL  Comprehensive metabolic panel     Status: Abnormal   Collection Time: 11/18/22  4:03 PM  Result Value Ref Range   Sodium 136 135 - 145 mmol/L   Potassium 3.8 3.5 - 5.1 mmol/L   Chloride 103 98 - 111 mmol/L   CO2 23 22 - 32 mmol/L   Glucose, Bld 111 (H) 70 - 99 mg/dL   BUN 18 8 - 23 mg/dL   Creatinine, Ser 0.86 0.61 - 1.24 mg/dL   Calcium 9.1 8.9 - 57.8 mg/dL   Total Protein 6.9 6.5 - 8.1 g/dL    Albumin 4.5 3.5 - 5.0 g/dL   AST 54 (H) 15 - 41 U/L   ALT 65 (H) 0 - 44 U/L   Alkaline Phosphatase 79 38 - 126 U/L   Total Bilirubin 1.0 0.3 - 1.2 mg/dL   GFR, Estimated >46 >96 mL/min   Anion gap 10 5 - 15  Ethanol     Status: None   Collection Time: 11/18/22  4:03 PM  Result Value Ref Range   Alcohol, Ethyl (B) <10 <10 mg/dL   Basic Metabolic Panel: Recent Labs  Lab 11/18/22 1603  NA 136  K 3.8  CL 103  CO2 23  GLUCOSE 111*  BUN 18  CREATININE 0.85  CALCIUM 9.1   Liver Function Tests: Recent Labs  Lab 11/18/22 1603  AST 54*  ALT 65*  ALKPHOS 79  BILITOT 1.0  PROT 6.9  ALBUMIN 4.5   No results for input(s): "LIPASE", "AMYLASE" in the last 168 hours. No results for input(s): "AMMONIA" in the last 168 hours. CBC: Recent Labs  Lab 11/18/22 1603  WBC 4.3  NEUTROABS 2.6  HGB 14.5  HCT 42.0  MCV 89.9  PLT 162   Cardiac Enzymes: No results for input(s): "CKTOTAL", "CKMB", "CKMBINDEX", "TROPONINIHS" in the last 168 hours.  BNP (last 3 results) No results for input(s): "PROBNP" in the last 8760 hours. CBG: Recent Labs  Lab 11/18/22 1527  GLUCAP 106*    Radiological Exams on Admission:  MR BRAIN W WO CONTRAST  Result Date: 11/18/2022 CLINICAL DATA:  Seizure, new-onset, history of trauma. EXAM: MRI HEAD WITHOUT AND WITH CONTRAST TECHNIQUE: Multiplanar, multiecho pulse sequences of the brain and surrounding structures were obtained without and with intravenous contrast. CONTRAST:  8mL GADAVIST GADOBUTROL 1 MMOL/ML IV SOLN COMPARISON:  Same day CT  head. FINDINGS: Brain: Multiple small acute infarcts in the high right frontal lobe. Mild associated edema. No mass effect. Additional scattered T2/FLAIR hyperintensities are matter, compatible with chronic microvascular ischemic disease. Multiple remote infarcts including left parietal, right occipital and left cerebellar remote infarcts. No evidence of acute hemorrhage, mass lesion midline shift or hydrocephalus.  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 Vascular: Major arterial flow voids are maintained. Skull and upper cervical spine: Normal marrow signal. Sinuses/Orbits: Clear sinuses.  No acute orbital findings. Other: No mastoid effusions. IMPRESSION: 1. Multiple small acute infarcts in the high right frontal lobe. No mass effect. 2. Multiple remote infarcts and chronic microvascular ischemic disease. 3. Evidence of prior subarachnoid hemorrhage. No evidence of acute hemorrhage. Electronically Signed   By: Feliberto Harts M.D.   On: 11/18/2022 19:41   CT ANGIO HEAD NECK W WO CM W PERF (CODE STROKE)  Result Date: 11/18/2022 CLINICAL DATA:  Neuro deficit, acute, stroke suspected EXAM: CT ANGIOGRAPHY HEAD AND NECK CT PERFUSION TECHNIQUE: Multidetector CT imaging of the head and neck was performed using the standard protocol during bolus administration of intravenous contrast. Multiplanar CT image reconstructions and MIPs were obtained to evaluate the vascular anatomy. Carotid stenosis measurements (when applicable) are obtained utilizing NASCET criteria, using the distal internal carotid diameter as the denominator. RADIATION DOSE REDUCTION: This exam was performed according to the departmental dose-optimization program which includes automated exposure control, adjustment of the mA and/or kV according to patient size and/or use of iterative reconstruction technique. CONTRAST:  OMNIPAQUE IOHEXOL 350 MG/ML SOLN COMPARISON:  CTA 05/12/2019. 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 Right carotid system: No evidence of dissection, stenosis (50% or greater), or occlusion. Left carotid system: Carotid bifurcation atherosclerosis without greater than 50% stenosis. Vertebral arteries: The left vertebral artery  arises from the left subclavian pseudoaneurysm with unchanged moderate origin stenosis. Otherwise, patent vertebral arteries. Right dominant. Skeleton: No acute fracture on limited assessment. Other neck: No acute abnormality on limited assessment. Upper chest: Visualized lung apices are clear. Review of the MIP images confirms the above findings CTA HEAD FINDINGS Anterior circulation: Bilateral intracranial ICAs, MCAs and ACAs are patent without proximal hemodynamically significant stenosis. No substantial change in tangle of vessels in the left temporal lobe with large draining vein, compatible with arteriovenous malformation. Posterior circulation: Bilateral intradural vertebral arteries, basilar artery and bilateral posterior cerebral arteries are patent without proximal hemodynamically significant stenosis. Venous sinuses: As permitted by contrast timing, patent. Review of the MIP images confirms the above findings CT PERFUSION FINDINGS T-max greater than 6 seconds: 0 mL. CBF less than 30%: 0 mL Mismatch: 0 mL Core infarct: 0 mL. IMPRESSION: 1. No emergent large vessel occlusion or proximal hemodynamically significant stenosis. 2. 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 4. No evidence of core infarct or penumbra on CT perfusion. Electronically Signed   By: Feliberto Harts M.D.   On: 11/18/2022 16:09   CT HEAD CODE STROKE WO CONTRAST  Result Date: 11/18/2022 CLINICAL DATA:  Code stroke.  weakness EXAM: CT HEAD WITHOUT CONTRAST TECHNIQUE: Contiguous axial images were obtained from the base of the skull through the vertex without intravenous contrast. RADIATION DOSE REDUCTION: This exam was performed according to the departmental dose-optimization program which includes automated exposure control, adjustment of the mA  and/or kV according to patient size and/or use of iterative reconstruction technique. COMPARISON:  CT October  16, 23. FINDINGS: Brain: Remote right posterior temporal/occipital region infarct, similar. Remote left parietal infarct, also similar. No evidence of acute large vascular territory infarct, acute hemorrhage, midline shift or hydrocephalus. 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 Vascular: No hyperdense vessel. Skull: No acute fracture. Sinuses/Orbits: Clear sinuses.  No acute findings. ASPECTS [facility] Stroke Program Early CT Score) total score (0-10 with 10 being normal): 10. IMPRESSION: 1. No evidence of acute intracranial abnormality. Multiple remote infarcts. 2. ASPECTS is 10. Code stroke imaging results were communicated on 11/18/2022 at 3:45 pm to provider Dr. Amada Jupiter via secure text paging. Electronically Signed   By: Feliberto Harts M.D.   On: 11/18/2022 15:46    Narrative & Impression  EXAM: MRI BRAIN WITH AND WITHOUT CONTRAST  INDICATION: 72 years old Male with worsening headaches. Arteriovenous malformation. History of stroke.   Technique: Multisequence, multiplanar MR imaging of the brain was obtained without and with contrast.  CONTRAST: 7 ml Vueway.  COMPARISON: 06/03/2021 and prior brain MRIs.  FINDINGS: No evidence of acute infarction or intracranial hemorrhage. Stable parenchymal changes related to remote left posterior parietal lobe, bilateral occipital lobes, and left cerebellar hemisphere infarcts. There are scattered and confluent foci of white matter T2/FLAIR signal abnormality, similar compared to the prior examination and suggestive of moderately advanced white matter chronic microvascular disease changes. No evidence of intraparenchymal mass effect. No abnormal brain parenchymal enhancement.  Scattered foci of remote parenchymal microhemorrhages are unchanged. There is curvilinear susceptibility artifact about the bilateral cerebral hemispheres, unchanged compared to the prior MRI and most suggestive of remote subarachnoid  hemorrhage.  Stable tangle of vessels in the left temporal lobe measuring up to approximately 2 cm with associated prominent draining vein, compatible with AVM.  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  Stable mild atrophy. Ventricle size is stable. No extra-axial collections. No abnormal dural thickening or enhancement. The major intracranial vessels demonstrate appropriate flow voids.  The orbits are unremarkable. The paranasal sinuses and mastoid air cells are pneumatized. The calvarium and scalp are unremarkable.  IMPRESSION:  1. No acute intracranial abnormality. 2. Stable moderately advanced white matter chronic microvascular disease changes and sequelae of multifocal remote infarctions. 3. Similar MRI appearance of the left temporal AVM. 4. Multifocal remote parenchymal microhemorrhages and sequelae of prior subarachnoid hemorrhage. 4. 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   EKG: Independently reviewed. NSR   Assessment and Plan: * Stroke (cerebrum) (HCC) Patient has a baseline of remote strokes with reported chronic right hemiparesis.  Patient has prior known left temporal AVM.  As well as multifocal remote parenchymal microhemorrhages and sequelae of prior subarachnoid hemorrhage.  Patient is followed by neurology at [facility] for this.  Patient has apparently had some "spells "of unresponsiveness which is being worked up for seizures.  Patient has previously declined radiation therapy for his temporal AVM.  Patient now presents with about 24 hours of left hemiparesis that is new with associated sensory changes and speech changes.  MRI showing multiple small acute infarcts in the right frontal lobe.  With patient still having some left lower extremity weakness and dysarthria.  At this time this is concerning for emboli shower from right-sided blood vessels versus less likely heart.  CT angio head and neck is not actionable.   I will proceed with an echo.  Unfortunately given patient some microhemorrhages, patient  is not a candidate for antiplatelet therapy or Lovenox for DVT prophylaxis.  I will start the patient on statin.  Permissive hypertension for this evening.  Neurochecks look forward to neurology evaluation.  Patient received seizure prophylaxis in the ER, we will discuss this more with neurology in the morning.  There is no definite diagnosis of seizures in this patient.  I will check an EEG  Elevated LFTs New compared to baseline as is visible in the chart.  Will check hepatitis panel as well as liver ultrasound  Diabetes (HCC) Hold oral hypoglycemic agents.  Treat with insulin sliding scale      Advance Care Planning:   Code Status: Full Code   Consults: neurology engaged by ER provider.  Family Communication: wife at bedsdie.  Severity of Illness: The appropriate patient status for this patient is INPATIENT. Inpatient status is judged to be reasonable and necessary in order to provide the required intensity of service to ensure the patient's safety. The patient's presenting symptoms, physical exam findings, and initial radiographic and laboratory data in the context of their chronic comorbidities is felt to place them at high risk for further clinical deterioration. Furthermore, it is not anticipated that the patient will be medically stable for discharge from the hospital within 2 midnights of admission.   * I certify that at the point of admission it is my clinical judgment that the patient will require inpatient hospital care spanning beyond 2 midnights from the point of admission due to high intensity of service, high risk for further deterioration and high frequency of surveillance required.*  Author: Nolberto Hanlon, MD 11/18/2022 8:08 PM  For on call review [email or web address].

## 2022-11-18 NOTE — ED Provider Notes (Signed)
[facility] Provider Note   Event Date/Time   First MD Initiated Contact with Patient 11/18/22 1529     (approximate) History  No chief complaint on file.  HPI Jon [name] is a 72 y.o. male with a past medical history of AVM and previous CVA who presents with new onset of left-sided upper and lower extremity weakness that began at approximately 1430 today and has been stable since onset.  Patient arrives via EMS as a code stroke and met in the CT.  Patient has slowed speech and history/review of systems are difficult to obtain at this time. ROS: Patient currently denies any vision changes, tinnitus, facial droop, sore throat, chest pain, shortness of breath, abdominal pain, nausea/vomiting/diarrhea, dysuria   Physical Exam  Triage Vital Signs: ED Triage Vitals  Encounter Vitals Group     BP      Systolic BP Percentile      Diastolic BP Percentile      Pulse      Resp      Temp      Temp src      SpO2      Weight      Height      Head Circumference      Peak Flow      Pain Score      Pain Loc      Pain Education      Exclude from Growth Chart    Most recent vital signs: There were no vitals filed for this visit. General: Awake, oriented x4. CV:  Good peripheral perfusion.  Resp:  Normal effort.  Abd:  No distention.  Other:  Elderly overweight Caucasian male laying in bed in no acute distress.  NIHSS 9.  Upper and lower left-sided drift ED Results / Procedures / Treatments  Labs (all labs ordered are listed, but only abnormal results are displayed) Labs Reviewed  CBG MONITORING, ED - Abnormal; Notable for the following components:      Result Value   Glucose-Capillary 106 (*)    All other components within normal limits  PROTIME-INR  APTT  CBC  DIFFERENTIAL  COMPREHENSIVE METABOLIC PANEL  ETHANOL  I-STAT CREATININE, ED   EKG ED ECG REPORT I, Merwyn Katos, the attending physician, personally viewed and interpreted this  ECG. Date: 11/18/2022 EKG Time: 1617 Rate: 73 Rhythm: normal sinus rhythm QRS Axis: normal Intervals: normal ST/T Wave abnormalities: normal Narrative Interpretation: no evidence of acute ischemia RADIOLOGY ED MD interpretation: CT of the head without contrast interpreted by me shows no evidence of acute abnormalities including no intracerebral hemorrhage, obvious masses, or significant edema -Agree with radiology assessment Official radiology report(s): CT HEAD CODE STROKE WO CONTRAST  Result Date: 11/18/2022 CLINICAL DATA:  Code stroke.  weakness EXAM: CT HEAD WITHOUT CONTRAST TECHNIQUE: Contiguous axial images were obtained from the base of the skull through the vertex without intravenous contrast. RADIATION DOSE REDUCTION: This exam was performed according to the departmental dose-optimization program which includes automated exposure control, adjustment of the mA and/or kV according to patient size and/or use of iterative reconstruction technique. COMPARISON:  CT October 16, 23. FINDINGS: Brain: Remote right posterior temporal/occipital region infarct, similar. Remote left parietal infarct, also similar. No evidence of acute large vascular territory infarct, acute hemorrhage, midline shift or hydrocephalus. 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 Vascular: No hyperdense vessel. Skull: No acute fracture. Sinuses/Orbits: Clear sinuses.  No acute findings. ASPECTS [facility] Stroke Program Early CT Score)  total score (0-10 with 10 being normal): 10. IMPRESSION: 1. No evidence of acute intracranial abnormality. Multiple remote infarcts. 2. ASPECTS is 10. Code stroke imaging results were communicated on 11/18/2022 at 3:45 pm to provider Dr. Amada Jupiter via secure text paging. Electronically Signed   By: Feliberto Harts M.D.   On: 11/18/2022 15:46   PROCEDURES: Critical Care performed: Yes, see critical care procedure note(s) .1-3 Lead EKG Interpretation  Performed by: Merwyn Katos, MD Authorized by: Merwyn Katos, MD     Interpretation: normal     ECG rate:  71   ECG rate assessment: normal     Rhythm: sinus rhythm     Ectopy: none     Conduction: normal   CRITICAL CARE Performed by: Merwyn Katos  Total critical care time: 31 minutes  Critical care time was exclusive of separately billable procedures and treating other patients.  Critical care was necessary to treat or prevent imminent or life-threatening deterioration.  Critical care was time spent personally by me on the following activities: development of treatment plan with patient and/or surrogate as well as nursing, discussions with consultants, evaluation of patient's response to treatment, examination of patient, obtaining history from patient or surrogate, ordering and performing treatments and interventions, ordering and review of laboratory studies, ordering and review of radiographic studies, pulse oximetry and re-evaluation of patient's condition.  MEDICATIONS ORDERED IN ED: Medications  sodium chloride flush (NS) 0.9 % injection 3 mL (has no administration in time range)  iohexol (OMNIPAQUE) 350 MG/ML injection 100 mL (100 mLs Intravenous Contrast Given 11/18/22 1540)   IMPRESSION / MDM / ASSESSMENT AND PLAN / ED COURSE  I reviewed the triage vital signs and the nursing notes.                             The patient is on the cardiac monitor to evaluate for evidence of arrhythmia and/or significant heart rate changes. Patient's presentation is most consistent with acute presentation with potential threat to life or bodily function. Stroke alert PMH risk factors: CVA, hyperlipidemia, hypertension, type 2 diabetes Neurologic Deficits: Dysarthria, left upper and lower extremity weakness, left-sided neglect Last known Well Time: 11/17/2022 NIH Stroke Score: 9 Given History and Exam I have lower suspicion for infectious etiology, neurologic changes secondary to toxicologic ingestion,  seizure, complex migraine. Presentation concerning for possible stroke requiring workup.  Workup: Labs: POC glucose, CBC, BMP, LFTs, Troponin, PT/INR, PTT, Type and Screen Other Diagnostics: ECG, CXR, non-contrast head CT followed by CTA brain and neck (to r/o large vessel occlusion amenable to thrombectomy) Interventions: Patient low on NIH scale and out of the window for tpa.  Consult: Neurology. Discussed with Dr. Amada Jupiter regarding patients neurological symptoms and last well-known time and eligibility for TPA criteria. Disposition: Admission to medicine.   FINAL CLINICAL IMPRESSION(S) / ED DIAGNOSES   Final diagnoses:  None   Rx / DC Orders   ED Discharge Orders     None      Note:  This document was prepared using Dragon voice recognition software and may include unintentional dictation errors.   Merwyn Katos, MD 11/18/22 2125

## 2022-11-18 NOTE — ED Triage Notes (Signed)
Pt comes via EMS from home with c/o left siuded weakness. Pt LKW at 14:30. Pt has hx of stroke. Neuro at bedside and assessing pt. Stroke RN at bedside.   Pt to go to CT 3

## 2022-11-18 NOTE — Assessment & Plan Note (Signed)
New compared to baseline as is visible in the chart.  Will check hepatitis panel as well as liver ultrasound

## 2022-11-18 NOTE — Assessment & Plan Note (Signed)
Hold oral hypoglycemic agents.  Treat with insulin sliding scale

## 2022-11-18 NOTE — Consult Note (Addendum)
Neurology Consultation Reason for Consult: Left-sided weakness Referring Physician: Rather, ED  CC: Left-sided weakness  History is obtained from: Patient, wife  HPI: RAVINDER LUKEHART is a 72 y.o. male with a history of AVM, multiple areas of sulcal and microhemorrhage, ischemic stroke who presents with left-sided weakness that started yesterday.  He also describes a left retro-orbital headache that is not associated with photophobia but is associated with photophobia.  Yesterday, he was dragging his left leg, but then this progressed to the point where today he was having trouble speaking and not moving his left side as well and therefore she was brought into the emergency department as a code stroke.  He also did note that he felt a "vibration" in his left arm prior to feeling it go numb.  LKW: Sometime yesterday tnk given?: no, outside of window   Past Medical History:  Diagnosis Date   AVM (arteriovenous malformation) brain    Diabetes mellitus without complication (HCC)    Hypertension    Pituitary tumor    TIA (transient ischemic attack)      Family History  Problem Relation Age of Onset   Diabetes Mellitus II Other        Maternal aunt     Social History:  reports that he has never smoked. He has never used smokeless tobacco. He reports that he does not drink alcohol and does not use drugs.   Exam: Current vital signs: BP (!) 163/90 (BP Location: Right Arm)   Pulse 75   Temp 97.9 F (36.6 C) (Oral)   Resp (!) 22   Ht 5\' 6"  (1.676 m)   Wt 82.8 kg   SpO2 99%   BMI 29.46 kg/m  Vital signs in last 24 hours: Temp:  [97.9 F (36.6 C)] 97.9 F (36.6 C) (07/11 1558) Pulse Rate:  [75] 75 (07/11 1558) Resp:  [22] 22 (07/11 1558) BP: (163)/(90) 163/90 (07/11 1558) SpO2:  [99 %] 99 % (07/11 1558) Weight:  [82.8 kg] 82.8 kg (07/11 1600)   Physical Exam  Appears well-developed and well-nourished.   Neuro: Mental Status: Patient is awake, alert, oriented to  person, place, month, year, and situation. Patient is able to give a clear and coherent history. No signs of aphasia or neglect Cranial Nerves: II: Visual Fields are full. Pupils are equal, round, and reactive to light.   III,IV, VI: EOMI without ptosis or diploplia.  V: Facial sensation is symmetric to temperature VII: Facial movement with left facial Motor: He has weakness of bilateral upper extremities, 4/5 in the right arm and 3/5 in the left arm.  He has 1/5 strength of the left leg, but able to hold the right leg and stretch without difficulty. Sensory: Sensation is diminished in the left leg Cerebellar: Discoordination consistent with weakness on the left, out of proportion weakness in the right arm     I have reviewed labs in epic and the results pertinent to this consultation are: Creatinine 0.85  I have reviewed the images obtained: CT head and neck-no large vessel occlusion  Impression: 72 year old male who presents with left-sided weakness that started yesterday.  He is outside the window for any type of thrombolytic therapy, and it would be contraindicated anyway due to presence of AVM.  He will need further evaluation with MRI.  I think there is a possibility that this represented seizure given the description of the vibration/jerking of his left arm before he became worse.  In that case this would represent  a post ictal Todd's phenomenon.  He has also had similar presentations in the past, and therefore with his unilateral headache with photophobia, I do think complicated migraine would also be a consideration.  With his history of cerebrovascular disease, this absolutely could be ischemic in nature and he will need an MRI to evaluate for this.  If MRI is negative, and he improves with a migraine cocktail, could consider discharge with Depakote as Depakote could treat either complicated migraine or seizure.  He is certainly at risk for seizures.  He was started on Depakote  briefly in 2018, and has it listed as an allergy due to headache, but neither he nor his wife remember this, are familiar with it.  I think it would be worth retrialing it.  Recommendations: 1) MRI brain 2) if he remains symptomatic, would admit for observation with EEG in the morning 3) if he improves significantly after migraine cocktail, could consider discharge with Depakote 500 mg twice daily and outpatient neurology follow-up 4) neurology will follow if admitted.   Ritta Slot, MD [facility] [phone]  If 7pm- 7am, please page neurology on call as listed in AMION.

## 2022-11-18 NOTE — Assessment & Plan Note (Signed)
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  Patient has prior known left temporal AVM.  As well as multifocal remote parenchymal microhemorrhages and sequelae of prior subarachnoid hemorrhage.  Patient is followed by neurology at [facility] for this.  Patient has apparently had some "spells "of unresponsiveness which is being worked up for seizures.  Patient has previously declined radiation therapy for his temporal AVM.  Patient now presents with about 24 hours of left hemiparesis that is new with associated sensory changes and speech changes.  MRI showing multiple small acute infarcts in the right frontal lobe.  With patient still having some left lower extremity weakness and dysarthria.  At this time this is concerning for emboli shower from right-sided blood vessels versus less likely heart.  CT angio head and neck is not actionable.  I will proceed with an echo.  Unfortunately given patient some microhemorrhages, patient is not a candidate for antiplatelet therapy or Lovenox for DVT prophylaxis.  I will start the patient on statin.  Permissive hypertension for this evening.  Neurochecks look forward to neurology evaluation.  Patient received seizure prophylaxis in the ER, we will discuss this more with neurology in the morning.  There is no definite diagnosis of seizures in this patient.  I will check an EEG

## 2022-11-18 NOTE — Progress Notes (Signed)
   11/18/22 1600  Spiritual Encounters  Type of Visit Initial  Care provided to: Pt and family  Conversation partners present during encounter Nurse  Referral source Code page  Reason for visit Code (Code stroke)  OnCall Visit Yes  Interventions  Spiritual Care Interventions Made Established relationship of care and support;Compassionate presence;Encouragement  Spiritual Care Plan  Spiritual Care Issues Still Outstanding No further spiritual care needs at this time (see row info)   Chaplain responded to code stroke and provided compassionate care.  Family at bedside.  Chaplain spiritual support services remain available as the need arises.

## 2022-11-19 ENCOUNTER — Encounter: Payer: Self-pay | Admitting: Internal Medicine

## 2022-11-19 ENCOUNTER — Inpatient Hospital Stay (HOSPITAL_COMMUNITY)
Admit: 2022-11-19 | Discharge: 2022-11-19 | Disposition: A | Discharge status: Home / Self Care | Payer: Medicare Other | Attending: Internal Medicine | Admitting: Internal Medicine

## 2022-11-19 ENCOUNTER — Other Ambulatory Visit: Payer: Self-pay

## 2022-11-19 ENCOUNTER — Inpatient Hospital Stay (HOSPITAL_COMMUNITY)
Admit: 2022-11-19 | Discharge: 2022-11-19 | Disposition: A | Discharge status: Home / Self Care | Payer: Medicare Other | Attending: Neurology | Admitting: Neurology

## 2022-11-19 ENCOUNTER — Inpatient Hospital Stay
Admit: 2022-11-19 | Discharge: 2022-11-19 | Disposition: A | Discharge status: Home / Self Care | Payer: Medicare Other | Attending: Internal Medicine | Admitting: Internal Medicine

## 2022-11-19 DIAGNOSIS — R471 Dysarthria and anarthria: Secondary | ICD-10-CM

## 2022-11-19 DIAGNOSIS — E1159 Type 2 diabetes mellitus with other circulatory complications: Secondary | ICD-10-CM

## 2022-11-19 DIAGNOSIS — I6389 Other cerebral infarction: Secondary | ICD-10-CM | POA: Diagnosis not present

## 2022-11-19 DIAGNOSIS — I63421 Cerebral infarction due to embolism of right anterior cerebral artery: Secondary | ICD-10-CM | POA: Diagnosis not present

## 2022-11-19 DIAGNOSIS — R4182 Altered mental status, unspecified: Secondary | ICD-10-CM

## 2022-11-19 LAB — ECHOCARDIOGRAM COMPLETE
AR max vel: 2.39 cm2
AV Area VTI: 2.54 cm2
AV Area mean vel: 2.46 cm2
AV Mean grad: 4 mmHg
AV Peak grad: 7.2 mmHg
Ao pk vel: 1.34 m/s
Area-P 1/2: 3.93 cm2
Height: 66 in
S' Lateral: 2.7 cm
Weight: 2920.65 oz

## 2022-11-19 LAB — LIPID PANEL
Cholesterol: 209 mg/dL — ABNORMAL HIGH (ref 0–200)
HDL: 41 mg/dL (ref >40)
LDL Cholesterol: 150 mg/dL — ABNORMAL HIGH (ref 0–99)
Total CHOL/HDL Ratio: 5.1 RATIO
Triglycerides: 88 mg/dL (ref <150)
VLDL: 18 mg/dL (ref 0–40)

## 2022-11-19 LAB — HEPATITIS PANEL, ACUTE
HCV Ab: NONREACTIVE
Hep A IgM: NONREACTIVE
Hep B C IgM: NONREACTIVE
Hepatitis B Surface Ag: NONREACTIVE

## 2022-11-19 LAB — GLUCOSE, CAPILLARY
Glucose-Capillary: 128 mg/dL — ABNORMAL HIGH (ref 70–99)
Glucose-Capillary: 161 mg/dL — ABNORMAL HIGH (ref 70–99)
Glucose-Capillary: 160 mg/dL — ABNORMAL HIGH (ref 70–99)

## 2022-11-19 LAB — CBG MONITORING, ED: Glucose-Capillary: 222 mg/dL — ABNORMAL HIGH (ref 70–99)

## 2022-11-19 MED ORDER — SODIUM CHLORIDE 0.9 % IV BOLUS
250.0000 mL | Freq: Once | INTRAVENOUS | Status: AC
  Administered 2022-11-19: 250 mL via INTRAVENOUS

## 2022-11-19 MED ORDER — PERFLUTREN LIPID MICROSPHERE
1.0000 mL | INTRAVENOUS | Status: AC | PRN
  Administered 2022-11-19: 2 mL via INTRAVENOUS

## 2022-11-19 MED ORDER — CLONAZEPAM 0.5 MG PO TABS: 0.5000 mg | ORAL_TABLET | Freq: Two times a day (BID) | ORAL | Status: DC | PRN

## 2022-11-19 MED ORDER — ASPIRIN 81 MG PO TBEC
81.0000 mg | DELAYED_RELEASE_TABLET | Freq: Every day | ORAL | Status: DC
  Administered 2022-11-19 – 2022-11-20 (×2): 81 mg via ORAL
  Filled 2022-11-19 (×2): qty 1

## 2022-11-19 NOTE — Progress Notes (Signed)
Subjective: Better today  Exam: Vitals:   11/19/22 1604 11/19/22 1955  BP: 123/73 129/75  Pulse: 81 77  Resp: 15 20  Temp: 98.6 F (37 C) 98.4 F (36.9 C)  SpO2: 98% 98%   Gen: In bed, NAD Resp: non-labored breathing, no acute distress Abd: soft, nt  Neuro: MS: Awake, alert, speaking more fluently than he was yesterday CN: Pupils equal and reactive, extraocular movements intact Motor: 4/5 in the left arm and leg Sensory: Reduced in the left leg  Pertinent Labs: LDL 150   Impression: 72 year old male with embolic appearing infarcts in the right side.  He has had multiple ischemic infarcts over his life, but also has had some sulcal spontaneous intracranial hemorrhage as well as an AVM.  In this setting, there is significant risk associated with anticoagulation.  I would also not favor dual antiplatelet therapy, however given his multiple ischemic infarcts, I do think a baby aspirin might be indicated.  Of note, his cortical hemorrhages appear stable since 2023.  I would favor prolonged cardiac monitoring to assess for atrial fibrillation, though rhythm control might be indicated as opposed to rate control in a patient who is not a good anticoagulation candidate if it is found.  Recommendations: 1) ASA 81 mg daily 2) he will need more aggressive lipid control with his LDL of 150 3) I would favor prolonged cardiac monitoring 4) follow-up with outpatient neurology.  Ritta Slot, MD [facility] [phone]  If 7pm- 7am, please page neurology on call as listed in AMION.

## 2022-11-19 NOTE — Progress Notes (Signed)
SLP Cancellation Note  Patient Details Name: Jon [name] MRN: [id number] DOB: 1951/03/31   Cancelled treatment:       Reason Eval/Treat Not Completed: Patient at procedure or test/unavailable  Pt having echo done. Will attempt at next available time.   Veatrice Eckstein 11/19/2022, 2:17 PM

## 2022-11-19 NOTE — ED Notes (Signed)
Patient ambulated to bathroom with walker.  Steady gait noted.  Reports he feels that he is able to ambulate better than yesterday

## 2022-11-19 NOTE — Progress Notes (Signed)
Patient admitted for Stroke. NIH Score #2 for this shift. Denise any distress. Vital sign with in normal limit. Complain of headache tylenol given. Bolus for orthostatics blood pressure. We will continue to monitor.

## 2022-11-19 NOTE — Evaluation (Signed)
Physical Therapy Evaluation Patient Details Name: Jon [name] MRN: [id number] DOB: 09-Oct-1950 Today's Date: 11/19/2022  History of Present Illness  Jon [name] is a 71yoM who comes to [facility] on 11/19/22 [name] left sided weakness. PMH: remote CVA with chornic right hemiparesis, multifocal remote parenchymal microhemorrhages and sequelae of prior subarachnoid hemorrhage.  Clinical Impression  Pt in bed on entry, agreeable to session, PLOF gathered. Pt reports improvements in symptoms since arrival, but remains significantly off from his baseline, continued notable weakness in leg, albeit arm and speech deficits have largely resolved. Pt given an opportunity to AMB farther than just to bathroom, struggles to cover 23ft, heavy gross fatigue is a factor, as is several significant LOB. Pt pauses several times over final 59ft, not well appearing, albeit provides little narrative to his presentation. Pt also using a more supportive assistive device compared to typical SCP or furniture use. Discussed concerns about being safe home alone in his current state, however pt does not acknowledge these new safety factors, feels confident in being by himself, question acceptance of situation v limited insight. Will continue to follow.        Assistance Recommended at Discharge Intermittent Supervision/Assistance  If plan is discharge home, recommend the following:  Can travel by private vehicle  A lot of help with walking and/or transfers;A little help with bathing/dressing/bathroom;Assist for transportation;Help with stairs or ramp for entrance   Yes    Equipment Recommendations None recommended by PT (has a RW at home)  Recommendations for Other Services       Functional Status Assessment Patient has had a recent decline in their functional status and demonstrates the ability to make significant improvements in function in a reasonable and predictable amount of time.     Precautions / Restrictions  Precautions Precautions: Fall Restrictions Weight Bearing Restrictions: No      Mobility  Bed Mobility Overal bed mobility: Needs Assistance Bed Mobility: Supine to Sit     Supine to sit: HOB elevated     General bed mobility comments: struggles to sit up to EOB, HOB already elevate some, pt needs cues from RN to use bed rail on right; Says he sleeps in a flat bed at home, but provides conflicting answers on home much difficulty he has with this at baseline.    Transfers Overall transfer level: Needs assistance Equipment used: Rolling walker (2 wheels) Transfers: Sit to/from Stand Sit to Stand: Min guard, Min assist           General transfer comment: immediate LOB to Right side and slight anterior upon standing, minA to stabilize, then he is able to steady self with RW.    Ambulation/Gait Ambulation/Gait assistance: Min guard, Min assist Gait Distance (Feet): 90 Feet Assistive device: Rolling walker (2 wheels) Gait Pattern/deviations: Step-to pattern       General Gait Details: generally slow; several LOB, veyr heavy use of arms required on RW ; Pt stops frequently on return protion, does not look well, but offers no subjective report of symptoms, just partial sentences without completion.  Stairs            Wheelchair Mobility     Tilt Bed    Modified Rankin (Stroke Patients Only)       Balance Overall balance assessment: History of Falls, Mild deficits observed, not formally tested, Needs assistance Sitting-balance support: Bilateral upper extremity supported Sitting balance-Leahy Scale: Poor     Standing balance support: Bilateral upper extremity supported, During functional activity, Reliant on  assistive device for balance Standing balance-Leahy Scale: Poor                               Pertinent Vitals/Pain Pain Assessment Pain Assessment: No/denies pain    Home Living Family/patient expects to be discharged to:: Private  residence Living Arrangements: Spouse/significant other Available Help at Discharge: Family;Available PRN/intermittently (wife works Civil [employer] fast streamer the day, [employer] [place]) Type of Home: House Home Access: Stairs to enter   [employer] of Steps: 7   Home Layout: One level Home Equipment: Agricultural consultant (2 wheels);Cane - single point Additional Comments: Pt says his wife bought him an Mining engineer scooterseveral months ago which he does not use.    Prior Function Prior Level of Function : Independent/Modified Independent;Driving;History of Falls (last six months)             Mobility Comments: furniture walks in home, 'does pretty good' with SPC outside of home, reports long activity tolerance shopping if he can hold onto a cart. 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 ADLs Comments: modI     Hand Dominance   Dominant Hand: Right    Extremity/Trunk Assessment        Lower Extremity Assessment Lower Extremity Assessment: LLE deficits/detail LLE Deficits / Details: says it feels weak and heavy, like its going to give way       Communication      Cognition Arousal/Alertness: Awake/alert Behavior During Therapy: WFL for tasks assessed/performed Overall Cognitive Status: Within Functional Limits for tasks assessed                                          General Comments      Exercises     Assessment/Plan    PT Assessment Patient needs continued PT services  PT Problem List Decreased strength;Decreased activity tolerance;Decreased balance;Decreased mobility       PT Treatment Interventions DME instruction;Neuromuscular re-education;Gait training;Functional mobility training;Stair training;Therapeutic activities;Therapeutic exercise;Balance training;Patient/family education    PT Goals (Current goals can be found in the Care Plan section)  Acute Rehab  PT Goals Patient Stated Goal: return to home PT Goal Formulation: With patient Time For Goal Achievement: 12/03/22 Potential to Achieve Goals: Good    Frequency Min 1X/week     Co-evaluation               AM-PAC PT "6 Clicks" Mobility  Outcome Measure Help needed turning from your back to your side while in a flat bed without using bedrails?: A Lot Help needed moving from lying on your back to sitting on the side of a flat bed without using bedrails?: A Lot Help needed moving to and from a bed to a chair (including a wheelchair)?: A Lot Help needed standing up from a chair using your arms (e.g., wheelchair or bedside chair)?: A Lot Help needed to walk in hospital room?: A Lot Help needed climbing 3-5 steps with a railing? : A Lot 6 Click Score: 12    End of Session Equipment Utilized During Treatment: Gait belt Activity Tolerance: Patient limited by fatigue Patient left: in bed;with bed alarm set   PT Visit Diagnosis: Difficulty in walking, not elsewhere classified (R26.2);Unsteadiness on feet (R26.81);Other abnormalities of gait and mobility (R26.89)    Time:  1610-9604 PT Time Calculation (min) (ACUTE ONLY): 16 min   Charges:   PT Evaluation $PT Eval Moderate Complexity: 1 Mod   PT General Charges $$ ACUTE PT VISIT: 1 Visit    10:56 AM, 11/19/22 Rosamaria Lints, PT, DPT Physical Therapist - [facility]  [phone] (ASCOM)       Jon [name] 11/19/2022, 10:51 AM

## 2022-11-19 NOTE — Progress Notes (Addendum)
[facility]  - [facility]   PATIENT NAME: Jon [name]    MR#:  [id number]  DATE OF BIRTH:  1950-06-09  SUBJECTIVE:  patient seen earlier was getting EEG at that time. No family at bedside. Came in with weakness of the leper right lower extremity. Speech appeared more clear today.    VITALS:  Blood pressure (!) 162/86, pulse 78, temperature 97.6 F (36.4 C), resp. rate 16, height 5\' 6"  (1.676 m), weight 82.8 kg, SpO2 100%.  PHYSICAL EXAMINATION:   GENERAL:  72 y.o.-year-old patient with no acute distress.  LUNGS: Normal breath sounds bilaterally, no wheezing CARDIOVASCULAR: S1, S2 normal. No murmur   ABDOMEN: Soft, nontender, nondistended. Bowel sounds present.  EXTREMITIES: No  edema b/l.    NEUROLOGIC: nonfocal  patient is alert and awake left upper and lower extremity weakness 4/5   LABORATORY PANEL:  CBC Recent Labs  Lab 11/18/22 1603  WBC 4.3  HGB 14.5  HCT 42.0  PLT 162    Chemistries  Recent Labs  Lab 11/18/22 1603  NA 136  K 3.8  CL 103  CO2 23  GLUCOSE 111*  BUN 18  CREATININE 0.85  CALCIUM 9.1  AST 54*  ALT 65*  ALKPHOS 79  BILITOT 1.0   Cardiac Enzymes No results for input(s): "TROPONINI" in the last 168 hours. RADIOLOGY:  US Abdomen Limited RUQ (LIVER/GB)  Result Date: 11/18/2022 CLINICAL DATA:  Hepatitis. EXAM: ULTRASOUND ABDOMEN LIMITED RIGHT UPPER QUADRANT COMPARISON:  None Available. FINDINGS: Gallbladder: No gallstones or wall thickening visualized (1.6 mm). No sonographic Murphy sign noted by sonographer. Common bile duct: Diameter: 4.1 mm Liver: No focal lesion identified. Diffusely increased echogenicity of the liver parenchyma is noted. Portal vein is patent on color Doppler imaging with normal direction of blood flow towards the liver. Other: None. IMPRESSION: Hepatic steatosis without focal liver lesions. Electronically Signed   By: Aram Candela M.D.   On: 11/18/2022 20:52   MR BRAIN W WO  CONTRAST  Result Date: 11/18/2022 CLINICAL DATA:  Seizure, new-onset, history of trauma. EXAM: MRI HEAD WITHOUT AND WITH CONTRAST TECHNIQUE: Multiplanar, multiecho pulse sequences of the brain and surrounding structures were obtained without and with intravenous contrast. CONTRAST:  8mL GADAVIST GADOBUTROL 1 MMOL/ML IV SOLN COMPARISON:  Same day CT head. FINDINGS: Brain: Multiple small acute infarcts in the high right frontal lobe. Mild associated edema. No mass effect. Additional scattered T2/FLAIR hyperintensities are matter, compatible with chronic microvascular ischemic disease. Multiple remote infarcts including left parietal, right occipital and left cerebellar remote infarcts. No evidence of acute hemorrhage, mass lesion midline shift or hydrocephalus. 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 Vascular: Major arterial flow voids are maintained. Skull and upper cervical spine: Normal marrow signal. Sinuses/Orbits: Clear sinuses.  No acute orbital findings. Other: No mastoid effusions. IMPRESSION: 1. Multiple small acute infarcts in the high right frontal lobe. No mass effect. 2. Multiple remote infarcts and chronic microvascular ischemic disease. 3. Evidence of prior subarachnoid hemorrhage. No evidence of acute hemorrhage. Electronically Signed   By: Feliberto Harts M.D.   On: 11/18/2022 19:41   CT ANGIO HEAD NECK W WO CM W PERF (CODE STROKE)  Result Date: 11/18/2022 CLINICAL DATA:  Neuro deficit, acute, stroke suspected EXAM: CT ANGIOGRAPHY HEAD AND NECK CT PERFUSION TECHNIQUE: Multidetector CT imaging of the head and neck was performed using the standard protocol during bolus administration of  intravenous contrast. Multiplanar CT image reconstructions and MIPs were obtained to evaluate the vascular anatomy. Carotid stenosis measurements (when applicable) are obtained utilizing NASCET  criteria, using the distal internal carotid diameter as the denominator. RADIATION DOSE REDUCTION: This exam was performed according to the departmental dose-optimization program which includes automated exposure control, adjustment of the mA and/or kV according to patient size and/or use of iterative reconstruction technique. CONTRAST:  OMNIPAQUE IOHEXOL 350 MG/ML SOLN COMPARISON:  CTA 05/12/2019. 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 Right carotid system: No evidence of dissection, stenosis (50% or greater), or occlusion. Left carotid system: Carotid bifurcation atherosclerosis without greater than 50% stenosis. 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 Otherwise, patent vertebral arteries. Right dominant. Skeleton: No acute fracture on limited assessment. Other neck: No acute abnormality on limited assessment. Upper chest: Visualized lung apices are clear. Review of the MIP images confirms the above findings CTA HEAD FINDINGS Anterior circulation: Bilateral intracranial ICAs, MCAs and ACAs are patent without proximal hemodynamically significant stenosis. No substantial change in tangle of vessels in the left temporal lobe with large draining vein, compatible with arteriovenous malformation. Posterior circulation: Bilateral intradural vertebral arteries, basilar artery and bilateral posterior cerebral arteries are patent without proximal hemodynamically significant stenosis. Venous sinuses: As permitted by contrast timing, patent. Review of the MIP images confirms the above findings CT PERFUSION FINDINGS T-max greater than 6 seconds: 0 mL. CBF less than 30%: 0 mL Mismatch: 0 mL Core infarct: 0 mL. IMPRESSION: 1. No emergent large vessel occlusion or proximal hemodynamically significant stenosis. 2. 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 3. Similar left temporal AVM. 4. No evidence of core infarct or penumbra on CT perfusion. Electronically Signed   By: Feliberto Harts M.D.   On: 11/18/2022 16:09   CT HEAD CODE STROKE WO CONTRAST  Result Date: 11/18/2022 CLINICAL DATA:  Code stroke.  weakness EXAM: CT HEAD WITHOUT CONTRAST TECHNIQUE: Contiguous axial images were obtained from the base of the skull through the vertex without intravenous contrast. RADIATION DOSE REDUCTION: This exam was performed according to the departmental dose-optimization program which includes automated exposure control, adjustment of the mA and/or kV according to patient size and/or use of iterative reconstruction technique. COMPARISON:  CT October 16, 23. FINDINGS: Brain: Remote right posterior temporal/occipital region infarct, similar. Remote left parietal infarct, also similar. No evidence of acute large vascular territory infarct, acute hemorrhage, midline shift or hydrocephalus. 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 Vascular: No hyperdense vessel. Skull: No acute fracture. Sinuses/Orbits: Clear sinuses.  No acute findings. ASPECTS [facility] Stroke Program Early CT Score) total score (0-10 with 10 being normal): 10. IMPRESSION: 1. No evidence of acute intracranial abnormality. Multiple remote infarcts. 2. ASPECTS is 10. Code stroke imaging results were communicated on 11/18/2022 at 3:45 pm to provider Dr. Amada Jupiter via secure text paging. Electronically Signed   By: Feliberto Harts M.D.   On: 11/18/2022 15:46    Assessment and Plan SIAN VALLIER is a 72 y.o. male with medical history significant of remote CVA with chornic right hemiparesis. However, patient has excellent functional capacity and is mobile.  Patient was in his usual state of health until yesterday afternoon at approximately 2 PM when patient had a new onset of left arm and lower extremity weakness as well as  numbness.  As well as  numbness on the left side of his face.   MRI Brain 1. Multiple small acute infarcts in the high right frontal lobe. No mass effect. 2. Multiple remote infarcts and chronic microvascular ischemic disease. 3. Evidence of prior subarachnoid hemorrhage. No evidence of acute Hemorrhage  Acute CVA right frontal(MRI) -- came in with weakness of left arm and lower extremity along with some dysarthria. -- Feels overall week today. -- Patient followed by Dr. Amada Jupiter neurology. Currently getting EEG --not on any antiplatelet at present. Will await neuro- recommendation -- PT OT and speech to see patient -- follow-up echo of the heart  Type II diabetes with HL -- hold PO meds-- sliding scale insulin  Hyperlipidemia -- Zetia and atorvastatin  Orthostatic hypotension -- give bolus of 250 mL normal saline  Genella Rife -- PPI    Procedures: Family communication :none today Consults : neuro-, ID CODE STATUS: full DVT Prophylaxis :SCD Level of care: Telemetry Medical Status is: Inpatient Remains inpatient appropriate because: cva w/u    TOTAL TIME TAKING CARE OF THIS PATIENT: 35 minutes.  >50% time spent on counselling and coordination of care  Note: This dictation was prepared with Dragon dictation along with smaller phrase technology. Any transcriptional errors that result from this process are unintentional.  Enedina Finner M.D    [facility]   CC: Primary care physician; Marguarite Arbour, MD

## 2022-11-19 NOTE — ED Notes (Signed)
Patient resting quietly with eyes closed.  Respirations even and unlabored.  °

## 2022-11-19 NOTE — Evaluation (Signed)
Occupational Therapy Evaluation Patient Details Name: Jon [name] MRN: [id number] DOB: 09-17-1950 Today's Date: 11/19/2022   History of Present Illness Pt is a 72 year old male presenting to the ED with  new onset of left arm and lower extremity weakness as well as numbness, dysarthria; admitted with stroke, MRI showing   mall acute infarcts in the right frontal lobe  R sided baseline weakness Multiple remote infarcts and chronic microvascular ischemic  disease., Evidence of prior subarachnoid hemorrhage. No evidence of acute  Hemorrhage; PMH significant for remote CVA with chornic right hemiparesis.   Clinical Impression   Chart reviewed, pt greeted in bathroom at sink with wife. As soon as therapist entered bathroom, pt appears with posterior LOB. Pt is oriented to self and grossly to situation, ?word finding deficits; PTA pt is generally MOD I in ADL/IADL, reports community mobility SPC use and falls. Pt presents with deficits in balance, activity tolerance, LUE function, sensation, ?cognition including awareness,  affecting optimal ADL completion. Pt also reports diplopia, will continue to assess vision. Session is limited by patient reported HA, requests to return to bed. Pt is left in bed,all needs met. Nurse aware of pt status. OT will follow acutely.      Recommendations for follow up therapy are one component of a multi-disciplinary discharge planning process, led by the attending physician.  Recommendations may be updated based on patient status, additional functional criteria and insurance authorization.   Assistance Recommended at Discharge Intermittent Supervision/Assistance  Patient can return home with the following A little help with walking and/or transfers;A little help with bathing/dressing/bathroom    Functional Status Assessment  Patient has had a recent decline in their functional status and demonstrates the ability to make significant improvements in function in a  reasonable and predictable amount of time.  Equipment Recommendations  BSC/3in1    Recommendations for Other Services       Precautions / Restrictions Precautions Precautions: Fall Restrictions Weight Bearing Restrictions: No      Mobility Bed Mobility Overal bed mobility: Needs Assistance Bed Mobility: Sit to Supine       Sit to supine: Mod assist        Transfers Overall transfer level: Needs assistance Equipment used: Rolling walker (2 wheels) Transfers: Sit to/from Stand Sit to Stand: Min assist, Mod assist                  Balance Overall balance assessment: Needs assistance, History of Falls Sitting-balance support: Bilateral upper extremity supported Sitting balance-Leahy Scale: Fair     Standing balance support: Bilateral upper extremity supported, During functional activity, Reliant on assistive device for balance Standing balance-Leahy Scale: Poor                             ADL either performed or assessed with clinical judgement   ADL Overall ADL's : Needs assistance/impaired     Grooming: Wash/dry hands;Sitting;Minimal assistance           Upper Body Dressing : Moderate assistance Upper Body Dressing Details (indicate cue type and reason): anticipated Lower Body Dressing: Maximal assistance   Toilet Transfer: Moderate assistance;Rolling walker (2 wheels) Toilet Transfer Details (indicate cue type and reason): short amb tranfser off toilet, step by step vcs         Functional mobility during ADLs: Moderate assistance;Rolling walker (2 wheels)       Vision Baseline Vision/History: 0 No visual deficits Patient Visual Report:  Diplopia;Blurring of vision;Eye fatigue/eye pain/headache Vision Assessment?: Yes (limited by HA, will continue to assess) Diplopia Assessment: Disappears with one eye closed Additional Comments: impaired FNF     Perception     Praxis      Pertinent Vitals/Pain Pain Assessment Pain  Assessment: Faces Faces Pain Scale: Hurts little more Pain Location: head Pain Descriptors / Indicators: Aching Pain Intervention(s): Monitored during session, Limited activity within patient's tolerance     Hand Dominance Right   Extremity/Trunk Assessment Upper Extremity Assessment Upper Extremity Assessment: LUE deficits/detail;RUE deficits/detail RUE Deficits / Details: appears generally 4/5 RUE Coordination: decreased fine motor;decreased gross motor LUE Deficits / Details: AROM: shoudler flexion approx 3/4 full AROM, elbow/wrist appear WFL ;PROM appears WFL   Lower Extremity Assessment Lower Extremity Assessment: LLE deficits/detail LLE Sensation: decreased light touch (feels "dull")       Communication Communication Communication: Expressive difficulties   Cognition Arousal/Alertness: Awake/alert Behavior During Therapy: WFL for tasks assessed/performed Overall Cognitive Status: Impaired/Different from baseline Area of Impairment: Orientation, Attention, Memory, Following commands, Safety/judgement, Awareness, Problem solving                 Orientation Level: Disoriented to, place, Time Current Attention Level: Sustained Memory: Decreased short-term memory Following Commands: Follows one step commands with increased time (and cueing) Safety/Judgement: Decreased awareness of deficits, Decreased awareness of safety Awareness: Emergent, Intellectual Problem Solving: Difficulty sequencing, Requires verbal cues, Slow processing, Requires tactile cues, Decreased initiation       General Comments  vss throughout            Home Living Family/patient expects to be discharged to:: Private residence Living Arrangements: Spouse/significant other Available Help at Discharge: Family;Available PRN/intermittently (wife is a CMA at [employer]) Type of Home: House Home Access: Stairs to enter [employer] of Steps: 7 Entrance Stairs-Rails: Right;Left;Can  reach both Home Layout: One level     Bathroom Shower/Tub: Tub/shower unit         Home Equipment: Agricultural consultant (2 wheels);Cane - single point;Electric scooter          Prior Functioning/Environment Prior Level of Function : Independent/Modified Independent;Driving;History of Falls (last six months)             Mobility Comments: pt reports he furniture walks in the house, spc community distances. ADLs Comments: MOD I with ADL/IADL        OT Problem List: Impaired balance (sitting and/or standing);Decreased safety awareness;Decreased knowledge of use of DME or AE;Impaired vision/perception;Decreased activity tolerance      OT Treatment/Interventions: Self-care/ADL training;Energy conservation;Balance training;Therapeutic exercise;DME and/or AE instruction;Neuromuscular education;Therapeutic activities;Patient/family education    OT Goals(Current goals can be found in the care plan section) Acute Rehab OT Goals Patient Stated Goal: feel better OT Goal Formulation: With patient Time For Goal Achievement: 12/03/22 Potential to Achieve Goals: Good ADL Goals Pt Will Perform Grooming: with modified independence Pt Will Perform Lower Body Dressing: with modified independence;sit to/from stand Pt Will Transfer to Toilet: with modified independence;ambulating Pt Will Perform Toileting - Clothing Manipulation and hygiene: with modified independence;sit to/from stand  OT Frequency: Min 1X/week    Co-evaluation              AM-PAC OT "6 Clicks" Daily Activity     Outcome Measure Help from another person eating meals?: None Help from another person taking care of personal grooming?: A Little Help from another person toileting, which includes using toliet, bedpan, or urinal?: A Little Help from another person bathing (including washing, rinsing,  drying)?: A Lot Help from another person to put on and taking off regular upper body clothing?: A Little Help from another  person to put on and taking off regular lower body clothing?: A Lot 6 Click Score: 17   End of Session Equipment Utilized During Treatment: Gait belt;Rolling walker (2 wheels) Nurse Communication: Mobility status  Activity Tolerance: Patient tolerated treatment well Patient left: in bed;with call bell/phone within reach;with bed alarm set;with family/visitor present  OT Visit Diagnosis: Unsteadiness on feet (R26.81);Muscle weakness (generalized) (M62.81);Repeated falls (R29.6)                Time: 1610-9604 OT Time Calculation (min): 19 min Charges:  OT General Charges $OT Visit: 1 Visit OT Evaluation $OT Eval Moderate Complexity: 1 Mod  Oleta Mouse, OTD OTR/L  11/19/22, 3:33 PM

## 2022-11-19 NOTE — Plan of Care (Signed)

## 2022-11-19 NOTE — Progress Notes (Signed)
EEG complete - results pending 

## 2022-11-19 NOTE — Progress Notes (Signed)
OT Cancellation Note  Patient Details Name: Jon [name] MRN: [id number] DOB: 04/22/1951   Cancelled Treatment:    Reason Eval/Treat Not Completed: Other (comment);Patient at procedure or test/ unavailable (pt getting set up for EEG, OT will reattempt as able)  Oleta Mouse, OTD OTR/L  11/19/22, 10:48 AM

## 2022-11-20 DIAGNOSIS — R4182 Altered mental status, unspecified: Secondary | ICD-10-CM

## 2022-11-20 DIAGNOSIS — I63411 Cerebral infarction due to embolism of right middle cerebral artery: Secondary | ICD-10-CM | POA: Diagnosis not present

## 2022-11-20 LAB — HEMOGLOBIN A1C
Hgb A1c MFr Bld: 8.2 % — ABNORMAL HIGH (ref 4.8–5.6)
Mean Plasma Glucose: 189 mg/dL

## 2022-11-20 LAB — GLUCOSE, CAPILLARY: Glucose-Capillary: 107 mg/dL — ABNORMAL HIGH (ref 70–99)

## 2022-11-20 MED ORDER — ASPIRIN 81 MG PO TBEC: 81.0000 mg | DELAYED_RELEASE_TABLET | Freq: Every day | ORAL | 12 refills | Status: AC

## 2022-11-20 MED ORDER — ATORVASTATIN CALCIUM 80 MG PO TABS: 80.0000 mg | ORAL_TABLET | Freq: Every day | ORAL | 2 refills | Status: AC

## 2022-11-20 NOTE — Progress Notes (Cosign Needed)
     [facility] REFERRAL        Occupational Therapy * Physical Therapy * Speech Therapy                           DATE 11/20/2022   PATIENT NAME Jon [name]  PATIENT MRN MRN: [id number]        DIAGNOSIS/DIAGNOSIS CODE Geannie Risen  DATE OF DISCHARGE: 11/21/2022       PRIMARY CARE PHYSICIAN   JEFFREY SPARKS   PCP PHONE/FAX      Dear Provider (Name: [facility] outpatient __  Fax: [phone]   I certify that I have examined this patient and that occupational/physical/speech therapy is necessary on an outpatient basis.    The patient has expressed interest in completing their recommended course of therapy at your  location.  Once a formal order from the patient's primary care physician has been obtained, please  contact him/her to schedule an appointment for evaluation at your earliest convenience.   Arly.Keller ]  Physical Therapy Evaluate and Treat  Arly.Keller  ]  Occupational Therapy Evaluate and Treat  [  ]  Speech Therapy Evaluate and Treat         The patient's primary care physician (listed above) must furnish and be responsible for a formal order such that the recommended services may be furnished while under the primary physician's care, and that the plan of care will be established and reviewed every 30 days (or more often if condition necessitates).

## 2022-11-20 NOTE — Progress Notes (Signed)
Occupational Therapy Treatment Patient Details Name: Jon [name] MRN: [id number] DOB: 1951/01/27 Today's Date: 11/20/2022   History of present illness Pt is a 72 year old male presenting to the ED with  new onset of left arm and lower extremity weakness as well as numbness, dysarthria; admitted with stroke, MRI showing   mall acute infarcts in the right frontal lobe  R sided baseline weakness Multiple remote infarcts and chronic microvascular ischemic  disease., Evidence of prior subarachnoid hemorrhage. No evidence of acute  Hemorrhage; PMH significant for remote CVA with chornic right hemiparesis.   OT comments  Upon entering room patient in bed willing to work with OT.  Patient orientated x 3.  Able to follow directions.  Patient report numbness better in left upper extremity. Upon assessment patient continues to have decreased strength in left upper extremity with active range of motion within normal limits.  Patient with decreased coordination.  But patient prior to onset did had a tremor in the left hand.  Patient following directions better today.  Does need verbal cueing for safety with sit and stand for hand placement.  Patient ambulated with rolling walker to bathroom with several episodes of weight shifting near loss of balance posteriorly.  Contact-guard by OT.  Patient progressing towards goals but recommend continue OT services in next setting.   Recommendations for follow up therapy are one component of a multi-disciplinary discharge planning process, led by the attending physician.  Recommendations may be updated based on patient status, additional functional criteria and insurance authorization.    Assistance Recommended at Discharge    Patient can return home with the following  A little help with walking and/or transfers;A little help with bathing/dressing/bathroom   Equipment Recommendations       Recommendations for Other Services      Precautions / Restrictions          Mobility Bed Mobility                    Transfers                         Balance                                           ADL either performed or assessed with clinical judgement   ADL                                              Extremity/Trunk Assessment              Vision Baseline Vision/History: 0 No visual deficits Vision Assessment?: Yes   Perception     Praxis      Cognition Arousal/Alertness: Awake/alert Behavior During Therapy: WFL for tasks assessed/performed Overall Cognitive Status: Within Functional Limits for tasks assessed Area of Impairment: Orientation                       Following Commands: Follows multi-step commands inconsistently Safety/Judgement: Decreased awareness of safety              Exercises Other Exercises Other Exercises: Pt show decrease strength in L UE - AROM WNL - 4/5 strengh shoulder to wrist , -  provided RTB for horizontal ABD shoulder , elbow extention - and then can do in supine protraction/retraction scapula; AROM for wrist and forearm in all planes Other Exercises: Patient with decreased coordination in left hand.  Decreased diadochokenesia - provided for pt [facility] for alternate digits picking up small objects; as well as small objects pick up a 3-point pinch<> palm -facilitating fingers on thumb and thumb and fingers Other Exercises: Standby assist for lower body dressing patient unable to maintain left lower extremity over right to don and doff socks. Other Exercises: Ambulating with rolling walker to bathroom patient needed verbal cueing for safety with hand placement for sit and stand.  As well as have positive loss of balance posteriorly several times that needed correction by OT.    Shoulder Instructions       General Comments      Pertinent Vitals/ Pain       Pain Assessment Pain Assessment: No/denies pain  Home Living  Family/patient expects to be discharged to:: Private residence Living Arrangements: Spouse/significant other Available Help at Discharge: Family;Available PRN/intermittently Type of Home: House       Home Layout: One level     Bathroom Shower/Tub: Tub/shower unit         Home Equipment: Agricultural consultant (2 wheels);Cane - single point;Electric scooter;None   Additional Comments: Wife work during day  Lives With: Spouse;Son    Prior Functioning/Environment              Frequency  Min 1X/week        Progress Toward Goals  OT Goals(current goals can now be found in the care plan section)     Acute Rehab OT Goals Patient Stated Goal: get better OT Goal Formulation: With patient Time For Goal Achievement: 12/03/22 Potential to Achieve Goals: Good  Plan      Co-evaluation                 AM-PAC OT "6 Clicks" Daily Activity     Outcome Measure                    End of Session Equipment Utilized During Treatment: Gait belt;Rolling walker (2 wheels)  OT Visit Diagnosis: Unsteadiness on feet (R26.81);Muscle weakness (generalized) (M62.81);Repeated falls (R29.6)   Activity Tolerance Patient tolerated treatment well   Patient Left in bed;with family/visitor present   Nurse Communication          Time: 1000-1031 OT Time Calculation (min): 31 min  Charges: OT General Charges $OT Visit: 1 Visit OT Treatments $Self Care/Home Management : 8-22 mins $Therapeutic Exercise: 8-22 mins  Josearmando Kuhnert OTR/L,CLT 11/20/2022, 12:38 PM

## 2022-11-20 NOTE — Evaluation (Signed)
Speech Language Pathology Evaluation Patient Details Name: Jon [name] MRN: [id number] DOB: Apr 06, 1951 Today's Date: 11/20/2022 Time: 0910-0930 SLP Time Calculation (min) (ACUTE ONLY): 20 min  Problem List:  Patient Active Problem List   Diagnosis Date Noted   Altered mental status 11/20/2022   Stroke (cerebrum) (HCC) 11/18/2022   Elevated LFTs 11/18/2022   CVA (cerebral vascular accident) (HCC) 05/13/2019   Migraine 05/12/2019   Depression 05/12/2019   Hypertension    Diabetes mellitus without complication (HCC)    Left arm weakness    Hypogonadism in male 03/13/2019   Pituitary adenoma (HCC) 03/13/2019   History of stroke 11/29/2018   Hx of adenomatous colonic polyps 11/29/2018   Other nontraumatic intracerebral hemorrhage (HCC) 09/06/2018   Occipital stroke (HCC) 08/19/2018   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   Left-sided weakness 06/02/2018   HTN (hypertension) 06/02/2018   Diabetes (HCC) 06/02/2018   GERD (gastroesophageal reflux disease) 06/02/2018   Migraine without aura and without status migrainosus, not intractable 01/04/2018   Dysarthria 01/04/2018   Headache 07/30/2017   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   Chronic pain of left knee 05/05/2017   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   TIA (transient ischemic attack) 05/01/2017   Pseudophakia of left eye 03/14/2017   OSA (obstructive sleep apnea) 11/02/2016   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   Hyperlipidemia, mixed 04/29/2015   Severe episode of recurrent major depressive disorder, 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/25/2014   Rhytides 01/04/2013   Gross hematuria 04/11/2012   Nephrolithiasis 04/11/2012   Heartburn 11/04/2010   Past Medical History:  Past Medical History:  Diagnosis Date   AVM (arteriovenous malformation) brain    Diabetes mellitus without complication (HCC)    Hypertension    Pituitary tumor    TIA (transient ischemic attack)    Past  Surgical History:  Past Surgical History:  Procedure Laterality Date   COLONOSCOPY     EYE SURGERY     HERNIA REPAIR     HPI:  Pt is a 72 year old male presenting to the ED with  new onset of left arm and lower extremity weakness as well as numbness, dysarthria; admitted with stroke, MRI, 7/11,  showing "1. Multiple small acute infarcts in the high right frontal lobe. No mass effect. 2. Multiple remote infarcts and chronic microvascular ischemic disease. 3. Evidence of prior subarachnoid hemorrhage. No evidence of acute hemorrhage." PMH significant for remote CVA with chronic right hemiparesis.   Assessment / Plan / Recommendation Clinical Impression  Pt seen for cognitive-linguistic assessment. Assessment completed via informal means. Pt presents with cognitive-linguistic deficits affecting attention, memory (immediate, short term), insight/safety awareness, and likely higher level problem soliving. Pt speech is mainly fluent with occasional instances (x2 during evaluation) of anomia/paucity with independent repair of communication breakdowns. No s/sx dysarthria appreciated. Pt endorsed short term memory deficits at baseline from previous strokes. Pt reports difficulty managing medication PTA. Recommend post-acute SLP services for further evaluation of higher level cognitive-linguistic ability in functional/home setting to help determine level of independence with iADLs. Pt would likely benefit from assistance with iADLs (e.g. medication, finances) at d/c.    SLP Assessment  SLP Recommendation/Assessment: All further Speech Lanaguage Pathology  needs can be addressed in the next venue of care SLP Visit Diagnosis: Cognitive communication deficit (R41.841)    Recommendations for follow up therapy are one component of a multi-disciplinary discharge planning process, led by the attending physician.  Recommendations may be updated based on patient status, additional  functional criteria and insurance  authorization.    Follow Up Recommendations  Follow physician's recommendations for discharge plan and follow up therapies    Assistance Recommended at Discharge  Intermittent Supervision/Assistance  Functional Status Assessment Patient has had a recent decline in their functional status and demonstrates the ability to make significant improvements in function in a reasonable and predictable amount of time.  Frequency and Duration           SLP Evaluation Cognition  Overall Cognitive Status: Impaired/Different from baseline Orientation Level: Oriented X4 Attention: Sustained Sustained Attention: Impaired Memory: Impaired Memory Impairment: Storage deficit;Decreased recall of new information;Decreased short term memory Awareness: Impaired Problem Solving: Appears intact (verbal)       Comprehension  Auditory Comprehension Overall Auditory Comprehension: Appears within functional limits for tasks assessed Yes/No Questions: Within Functional Limits Commands: Within Functional Limits Conversation: Simple Other Conversation Comments: extra time Interfering Components: Attention    Expression Expression Primary Mode of Expression: Verbal Verbal Expression Overall Verbal Expression: Impaired Initiation: No impairment Automatic Speech: Name;Social Response (WFL) Repetition: No impairment Naming: Impairment Divergent:  (reduced verbal fluency) Other Naming Comments: occasional anomia at the conversation level; with indep repair Written Expression Dominant Hand: Right   Oral / Motor  Oral Motor/Sensory Function Overall Oral Motor/Sensory Function: Within functional limits Motor Speech Overall Motor Speech: Appears within functional limits for tasks assessed Respiration: Within functional limits Phonation: Normal Resonance: Within functional limits Articulation: Within functional limitis Intelligibility: Intelligible Motor Planning: Witnin functional limits            Clyde Canterbury, M.S., CCC-SLP Speech-Language Pathologist [facility] [place] [phone] (ASCOM)  Woodroe Chen 11/20/2022, 10:17 AM

## 2022-11-20 NOTE — Discharge Summary (Addendum)
Physician Discharge Summary   Patient: Jon [name] MRN: [id number] DOB: 06-19-1950  Admit date:     11/18/2022  Discharge date: 11/20/22  Discharge Physician: Enedina Finner   PCP: Marguarite Arbour, MD   Recommendations at discharge:    F/u PCP in 1 week patient to follow-up with [facility] neurology Dr. Sherryll Burger as outpatient. Many referrals by PCP to make appointment.  Discharge Diagnoses: Principal Problem:   Stroke (cerebrum) (HCC) Active Problems:   Diabetes (HCC)   Dysarthria   Elevated LFTs   Altered mental status   Jon [name] is a 72 y.o. male with medical history significant of remote CVA with chornic right hemiparesis. However, patient has excellent functional capacity and is mobile.  Patient was in his usual state of health until yesterday afternoon at approximately 2 PM when patient had a new onset of left arm and lower extremity weakness as well as numbness.  As well as numbness on the left side of his face.    MRI Brain 1. Multiple small acute infarcts in the high right frontal lobe. No mass effect. 2. Multiple remote infarcts and chronic microvascular ischemic disease. 3. Evidence of prior subarachnoid hemorrhage. No evidence of acute Hemorrhage   Acute CVA right frontal(MRI) -- came in with weakness of left arm and lower extremity along with some dysarthria. -- Feels overall week today. -- Patient followed by Dr. Amada Jupiter neurology. --EEG no seizure or seizure predisposition noted on the study. --not on any antiplatelet at present. Neurology recommends ASA 81 mg -- PT OT and speech to see patient--recommends rehb but pt is agreeable for HHPT -- follow-up echo of the heart--Left Ventricle: Left ventricular ejection fraction, by estimation, is 60  to 65%. Left ventricular ejection fraction by PLAX is 61 %. The left  ventricle has normal function. The left ventricle has no regional wall  motion abnormalities. Right ventricular systolic function is normal. The  right ventricular  size is normal. The mitral valve is normal in structure. No evidence of mitral valve  regurgitation. The aortic valve is tricuspid. Aortic valve regurgitation is not  visualized.  --will d/w [facility] cardiology on Monday to get Zio monitor set up thru out pt office   Type II diabetes with HL -- resume meds at d/c   Hyperlipidemia -- Zetia and atorvastatin   Orthostatic hypotension -- give bolus of 250 mL normal saline  --improved  Gerd -- PPI   overall improving. Okay from neuro- standpoint for discharge. Discussed with patient and wife at bedside. Agreeable with plan. Patient will follow-up with [facility] neurology as outpatient. He may need to get referral through Dr. Judithann Sheen PCP.   Procedures: Family communication : wife at bedside Consults : neuro CODE STATUS: full DVT Prophylaxis :SCD     Disposition: Home health Diet recommendation:  Discharge Diet Orders (From admission, onward)     Start     Ordered   11/20/22 0000  Diet - low sodium heart healthy        11/20/22 1214           Cardiac and Carb modified diet DISCHARGE MEDICATION: Allergies as of 11/20/2022       Reactions   Beta Adrenergic Blockers Itching   Topiramate Nausea And Vomiting   Other reaction(s): Dizziness, Hallucination Other Reaction: violence   Wasp Venom Anaphylaxis   Aspirin Other (See Comments)   Per pt's PCP due to spontaneous bleeding in his brain.   Benadryl [diphenhydramine] Hives   Lisinopril Itching  Other reaction(s): Other (See Comments) Leg pain   Nabumetone Other (See Comments)   Reaction: unknown   Valproic Acid Other (See Comments)   Reaction: Headache        Medication List     STOP taking these medications    ibuprofen 200 MG tablet Commonly known as: ADVIL   rosuvastatin 10 MG tablet Commonly known as: CRESTOR       TAKE these medications    acetaminophen 500 MG tablet Commonly known as: TYLENOL Take 2 tablets (1,000 mg total) by mouth  every 6 (six) hours as needed.   albuterol 108 (90 Base) MCG/ACT inhaler Commonly known as: VENTOLIN HFA Inhale 2 puffs into the lungs every 6 (six) hours as needed for wheezing or shortness of breath.   amLODipine 5 MG tablet Commonly known as: NORVASC Take 5 mg by mouth daily.   aspirin EC 81 MG tablet Take 1 tablet (81 mg total) by mouth daily. Swallow whole. Start taking on: November 21, 2022   atorvastatin 80 MG tablet Commonly known as: LIPITOR Take 1 tablet (80 mg total) by mouth daily. Start taking on: November 21, 2022   clonazePAM 0.5 MG tablet Commonly known as: KLONOPIN Take 0.5 mg by mouth 2 (two) times daily as needed.   DULoxetine 30 MG capsule Commonly known as: CYMBALTA Take 30 mg by mouth daily.   EPINEPHrine 0.3 mg/0.3 mL Soaj injection Commonly known as: EPI-PEN Inject 0.3 mg into the muscle as needed for anaphylaxis.   ezetimibe 10 MG tablet Commonly known as: ZETIA Take 10 mg by mouth daily.   fluticasone-salmeterol 250-50 MCG/ACT Aepb Commonly known as: ADVAIR Inhale 1 puff into the lungs in the morning and at bedtime.   glipiZIDE 10 MG 24 hr tablet Commonly known as: GLUCOTROL XL Take 10 mg by mouth daily.   pantoprazole 40 MG tablet Commonly known as: PROTONIX Take 40 mg by mouth daily.   traZODone 100 MG tablet Commonly known as: DESYREL Take 100 mg by mouth at bedtime.        Follow-up Information     Sparks, Duane Lope, MD. Schedule an appointment as soon as possible for a visit.   Specialty: Internal Medicine Why: hospital f/u CVA Contact information: [address] [facility] [place] [address] [phone]         Lonell Face, MD. Call.   Specialty: Neurology Why: for Acute CVA f/u pt to call and make appt. May need PCP referral for it Contact information: [address] [facility] [address] [facility] [place] [address] [phone]                Discharge Exam: Ceasar Mons Weights    11/18/22 1600  Weight: 82.8 kg    GENERAL:  72 y.o.-year-old patient with no acute distress.  LUNGS: Normal breath sounds bilaterally, no wheezing CARDIOVASCULAR: S1, S2 normal. No murmur   ABDOMEN: Soft, nontender, nondistended. Bowel sounds present.  EXTREMITIES: No  edema b/l.    NEUROLOGIC: nonfocal  patient is alert and awake left upper and lower extremity weakness 4/5  Condition at discharge: fair  The results of significant diagnostics from this hospitalization (including imaging, microbiology, ancillary and laboratory) are listed below for reference.   Imaging Studies: ECHOCARDIOGRAM COMPLETE  Result Date: 11/19/2022    ECHOCARDIOGRAM REPORT   Patient Name:   Jon [name] Date of Exam: 11/19/2022 Medical Rec #:  [id number]       Height:  66.0 in Accession #:    [id number]      Weight:       182.5 lb Date of Birth:  28-Jun-1950      BSA:          1.924 m Patient Age:    71 years        BP:           163/90 mmHg Patient Gender: M               HR:           80 bpm. Exam Location:  [facility] Procedure: 2D Echo, Cardiac Doppler, Color Doppler, Strain Analysis and            Intracardiac Opacification Agent Indications:     Stroke I63.9  History:         Patient has prior history of Echocardiogram examinations, most                  recent 06/04/2021.  Sonographer:     Neysa Bonito Roar Sonographer#2:   Cristela Blue Referring Phys:  [id number] [facility] GOEL Diagnosing Phys: Debbe Odea MD  Sonographer Comments: Global longitudinal strain was attempted. IMPRESSIONS  1. Left ventricular ejection fraction, by estimation, is 60 to 65%. Left ventricular ejection fraction by PLAX is 61 %. The left ventricle has normal function. The left ventricle has no regional wall motion abnormalities. Left ventricular diastolic parameters were normal. The average left ventricular global longitudinal strain is -17.2 %. The global longitudinal strain is normal.  2. Right ventricular systolic function is normal. The right  ventricular size is normal.  3. The mitral valve is normal in structure. No evidence of mitral valve regurgitation.  4. The aortic valve is tricuspid. Aortic valve regurgitation is not visualized. FINDINGS  Left Ventricle: Left ventricular ejection fraction, by estimation, is 60 to 65%. Left ventricular ejection fraction by PLAX is 61 %. The left ventricle has normal function. The left ventricle has no regional wall motion abnormalities. Definity contrast agent was given IV to delineate the left ventricular endocardial borders. The average left ventricular global longitudinal strain is -17.2 %. The global longitudinal strain is normal. The left ventricular internal cavity size was normal in size. There is  no left ventricular hypertrophy. Left ventricular diastolic parameters were normal. Right Ventricle: The right ventricular size is normal. No increase in right ventricular wall thickness. Right ventricular systolic function is normal. Left Atrium: Left atrial size was normal in size. Right Atrium: Right atrial size was normal in size. Pericardium: There is no evidence of pericardial effusion. Mitral Valve: The mitral valve is normal in structure. No evidence of mitral valve regurgitation. Tricuspid Valve: The tricuspid valve is normal in structure. Tricuspid valve regurgitation is not demonstrated. Aortic Valve: The aortic valve is tricuspid. Aortic valve regurgitation is not visualized. Aortic valve mean gradient measures 4.0 mmHg. Aortic valve peak gradient measures 7.2 mmHg. Aortic valve area, by VTI measures 2.54 cm. Pulmonic Valve: The pulmonic valve was not well visualized. Pulmonic valve regurgitation is not visualized. Aorta: The aortic root and ascending aorta are structurally normal, with no evidence of dilitation. IAS/Shunts: No atrial level shunt detected by color flow Doppler.  LEFT VENTRICLE PLAX 2D LV EF:         Left            Diastology                ventricular     LV e' medial:  99.00 cm/s                 ejection        LV e' lateral: 12.00 cm/s                fraction by                PLAX is 61      2D                %.              Longitudinal LVIDd:         4.00 cm         Strain LVIDs:         2.70 cm         2D Strain GLS  -17.2 % LV PW:         1.00 cm         Avg: LV IVS:        1.10 cm LVOT diam:     1.90 cm LV SV:         64 LV SV Index:   33 LVOT Area:     2.84 cm  RIGHT VENTRICLE RV Basal diam:  2.90 cm RV Mid diam:    2.40 cm RV S prime:     21.40 cm/s TAPSE (M-mode): 4.1 cm LEFT ATRIUM             Index        RIGHT ATRIUM           Index LA diam:        3.40 cm 1.77 cm/m   RA Area:     16.80 cm LA Vol (A2C):   60.2 ml 31.29 ml/m  RA Volume:   38.80 ml  20.17 ml/m LA Vol (A4C):   38.0 ml 19.75 ml/m LA Biplane Vol: 51.4 ml 26.72 ml/m  AORTIC VALVE                    PULMONIC VALVE AV Area (Vmax):    2.39 cm     PV Vmax:        1.04 m/s AV Area (Vmean):   2.46 cm     PV Peak grad:   4.3 mmHg AV Area (VTI):     2.54 cm     RVOT Peak grad: 3 mmHg AV Vmax:           134.00 cm/s AV Vmean:          88.800 cm/s AV VTI:            0.253 m AV Peak Grad:      7.2 mmHg AV Mean Grad:      4.0 mmHg LVOT Vmax:         113.00 cm/s LVOT Vmean:        77.000 cm/s LVOT VTI:          0.227 m LVOT/AV VTI ratio: 0.90  AORTA Ao Asc diam: 3.20 cm MITRAL VALVE MV Area (PHT): 3.93 cm  SHUNTS MV Decel Time: 193 msec  Systemic VTI:  0.23 m MV A Prime: 10.0 cm/s    Systemic Diam: 1.90 cm Debbe Odea MD Electronically signed by Debbe Odea MD Signature Date/Time: 11/19/2022/4:58:11 PM    Final    EEG adult  Result Date: 11/19/2022 Rejeana Brock, MD     11/20/2022  8:54 AM History: 72 year old male  who presented with left-sided weakness and confusion Sedation: None Technique: This EEG was acquired with electrodes placed according to the International 10-20 electrode system (including Fp1, Fp2, F3, F4, C3, C4, P3, P4, O1, O2, T3, T4, T5, T6, A1, A2, Fz, Cz, Pz). The following electrodes  were missing or displaced: none. Background: The background is well-organized with a normally distributed posterior dominant rhythm and she is a maximal frequency of 7 to 7.5 Hz.  There is also a normal distribution of anteriorly predominant beta activity. There is anterior shifting of this posterior dominant frequency associated with drowsiness, but sleep is not recorded.  There was no epileptiform activity recorded. Photic stimulation: Physiologic driving is now performed EEG Abnormalities: Slow PDR Clinical Interpretation: This borderline EEG could be suggestive of a mild nonspecific cerebral dysfunction (encephalopathy).  Slow PDR can be seen in normal aging, though he is slightly young for this, making the significance of this unclear. There was no seizure or seizure predisposition recorded on this study. Please note that lack of epileptiform activity on EEG does not preclude the possibility of epilepsy. Ritta Slot, MD [facility] [phone] If 7pm- 7am, please page neurology on call as listed in AMION.   US Abdomen Limited RUQ (LIVER/GB)  Result Date: 11/18/2022 CLINICAL DATA:  Hepatitis. EXAM: ULTRASOUND ABDOMEN LIMITED RIGHT UPPER QUADRANT COMPARISON:  None Available. FINDINGS: Gallbladder: No gallstones or wall thickening visualized (1.6 mm). No sonographic Murphy sign noted by sonographer. Common bile duct: Diameter: 4.1 mm Liver: No focal lesion identified. Diffusely increased echogenicity of the liver parenchyma is noted. Portal vein is patent on color Doppler imaging with normal direction of blood flow towards the liver. Other: None. IMPRESSION: Hepatic steatosis without focal liver lesions. Electronically Signed   By: Aram Candela M.D.   On: 11/18/2022 20:52   MR BRAIN W WO CONTRAST  Result Date: 11/18/2022 CLINICAL DATA:  Seizure, new-onset, history of trauma. EXAM: MRI HEAD WITHOUT AND WITH CONTRAST TECHNIQUE: Multiplanar, multiecho pulse sequences of the brain and  surrounding structures were obtained without and with intravenous contrast. CONTRAST:  8mL GADAVIST GADOBUTROL 1 MMOL/ML IV SOLN COMPARISON:  Same day CT head. FINDINGS: Brain: Multiple small acute infarcts in the high right frontal lobe. Mild associated edema. No mass effect. Additional scattered T2/FLAIR hyperintensities are matter, compatible with chronic microvascular ischemic disease. Multiple remote infarcts including left parietal, right occipital and left cerebellar remote infarcts. No evidence of acute hemorrhage, mass lesion midline shift or hydrocephalus. 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 Vascular: Major arterial flow voids are maintained. Skull and upper cervical spine: Normal marrow signal. Sinuses/Orbits: Clear sinuses.  No acute orbital findings. Other: No mastoid effusions. IMPRESSION: 1. Multiple small acute infarcts in the high right frontal lobe. No mass effect. 2. Multiple remote infarcts and chronic microvascular ischemic disease. 3. Evidence of prior subarachnoid hemorrhage. No evidence of acute hemorrhage. Electronically Signed   By: Feliberto Harts M.D.   On: 11/18/2022 19:41   CT ANGIO HEAD NECK W WO CM W PERF (CODE STROKE)  Result Date: 11/18/2022 CLINICAL DATA:  Neuro deficit, acute, stroke suspected EXAM: CT ANGIOGRAPHY HEAD AND NECK CT PERFUSION TECHNIQUE: Multidetector CT imaging of the head and neck was performed using the standard protocol during bolus administration of intravenous contrast. Multiplanar CT image reconstructions and MIPs were obtained to evaluate the vascular anatomy. Carotid stenosis measurements (when applicable) are obtained utilizing NASCET criteria, using the distal  internal carotid diameter as the denominator. RADIATION DOSE REDUCTION: This exam was performed according to the departmental dose-optimization program which includes  automated exposure control, adjustment of the mA and/or kV according to patient size and/or use of iterative reconstruction technique. CONTRAST:  OMNIPAQUE IOHEXOL 350 MG/ML SOLN COMPARISON:  CTA 05/12/2019. 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 Right carotid system: No evidence of dissection, stenosis (50% or greater), or occlusion. Left carotid system: Carotid bifurcation atherosclerosis without greater than 50% stenosis. 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 Otherwise, patent vertebral arteries. Right dominant. Skeleton: No acute fracture on limited assessment. Other neck: No acute abnormality on limited assessment. Upper chest: Visualized lung apices are clear. Review of the MIP images confirms the above findings CTA HEAD FINDINGS Anterior circulation: Bilateral intracranial ICAs, MCAs and ACAs are patent without proximal hemodynamically significant stenosis. No substantial change in tangle of vessels in the left temporal lobe with large draining vein, compatible with arteriovenous malformation. Posterior circulation: Bilateral intradural vertebral arteries, basilar artery and bilateral posterior cerebral arteries are patent without proximal hemodynamically significant stenosis. Venous sinuses: As permitted by contrast timing, patent. Review of the MIP images confirms the above findings CT PERFUSION FINDINGS T-max greater than 6 seconds: 0 mL. CBF less than 30%: 0 mL Mismatch: 0 mL Core infarct: 0 mL. IMPRESSION: 1. No emergent large vessel occlusion or proximal hemodynamically significant stenosis. 2. 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 4. No evidence of core infarct or penumbra on CT perfusion. Electronically Signed   By: Feliberto Harts M.D.   On: 11/18/2022 16:09   CT HEAD CODE STROKE WO CONTRAST  Result Date: 11/18/2022 CLINICAL DATA:  Code stroke.  weakness EXAM: CT HEAD WITHOUT CONTRAST TECHNIQUE: Contiguous axial images were obtained from the base of the skull through the vertex without intravenous contrast. RADIATION DOSE REDUCTION: This exam was performed according to the departmental dose-optimization program which includes automated exposure control, adjustment of the mA and/or kV according to patient size and/or use of iterative reconstruction technique. COMPARISON:  CT October 16, 23. FINDINGS: Brain: Remote right posterior temporal/occipital region infarct, similar. Remote left parietal infarct, also similar. No evidence of acute large vascular territory infarct, acute hemorrhage, midline shift or hydrocephalus. 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 Vascular: No hyperdense vessel. Skull: No acute fracture. Sinuses/Orbits: Clear sinuses.  No acute findings. ASPECTS [facility] Stroke Program Early CT Score) total score (0-10 with 10 being normal): 10. IMPRESSION: 1. No evidence of acute intracranial abnormality. Multiple remote infarcts. 2. ASPECTS is 10. Code stroke imaging results were communicated on 11/18/2022 at 3:45 pm to provider Dr. Amada Jupiter via secure text paging. Electronically Signed   By: Feliberto Harts M.D.   On: 11/18/2022 15:46    Microbiology: Results for orders placed or performed during the hospital encounter of 02/22/22  Blood Culture (routine x 2)     Status: None   Collection Time: 02/22/22 10:50 AM   Specimen: BLOOD  Result Value Ref Range Status   Specimen Description BLOOD BLOOD RIGHT ARM  Final   Special Requests   Final    BOTTLES DRAWN AEROBIC AND ANAEROBIC Blood Culture results may not be optimal due to an excessive volume of blood received in culture bottles   Culture   Final    NO GROWTH 5  DAYS Performed at [facility], [address]  [place], [place] [address]    Report Status 02/27/2022 FINAL  Final  Blood Culture (routine x 2)     Status: None   Collection Time: 02/22/22 11:50 AM   Specimen: BLOOD  Result Value Ref Range Status   Specimen Description BLOOD BLOOD RIGHT ARM  Final   Special Requests   Final    BOTTLES DRAWN AEROBIC AND ANAEROBIC Blood Culture adequate volume   Culture   Final    NO GROWTH 5 DAYS Performed at [facility], [address] [place], [place] [address]    Report Status 02/27/2022 FINAL  Final    Labs: CBC: Recent Labs  Lab 11/18/22 1603  WBC 4.3  NEUTROABS 2.6  HGB 14.5  HCT 42.0  MCV 89.9  PLT 162   Basic Metabolic Panel: Recent Labs  Lab 11/18/22 1603  NA 136  K 3.8  CL 103  CO2 23  GLUCOSE 111*  BUN 18  CREATININE 0.85  CALCIUM 9.1   Liver Function Tests: Recent Labs  Lab 11/18/22 1603  AST 54*  ALT 65*  ALKPHOS 79  BILITOT 1.0  PROT 6.9  ALBUMIN 4.5   CBG: Recent Labs  Lab 11/19/22 0748 11/19/22 1223 11/19/22 1604 11/19/22 2131 11/20/22 0814  GLUCAP 222* 128* 161* 160* 107*    Discharge time spent: greater than 30 minutes.  Signed: Enedina Finner, MD [facility] 11/20/2022

## 2022-11-20 NOTE — Progress Notes (Signed)
Physical Therapy Treatment Patient Details Name: Jon [name] MRN: [id number] DOB: [birth year] Today's Date: 11/20/2022   History of Present Illness Pt is a 72 year old male presenting to the ED with  new onset of left arm and lower extremity weakness as well as numbness, dysarthria; admitted with stroke, MRI showing   mall acute infarcts in the right frontal lobe  R sided baseline weakness Multiple remote infarcts and chronic microvascular ischemic  disease., Evidence of prior subarachnoid hemorrhage. No evidence of acute  Hemorrhage; PMH significant for remote CVA with chornic right hemiparesis.    PT Comments  Pt performs bed mobility, tranfers, gait with RW, and stairs (8 steps with 2 rails) with min guard to Min A overall.  Skilled session focused on pt/family education on safety with mobility due to pt being at a high fall risk with LOB to each side and Left inattention during mobility.  Continued PT will assist pt towards greater safety and independence with mobility.    Assistance Recommended at Discharge Intermittent Supervision/Assistance  If plan is discharge home, recommend the following:  Can travel by private vehicle    A lot of help with walking and/or transfers;A little help with bathing/dressing/bathroom;Assist for transportation;Help with stairs or ramp for entrance   Yes  Equipment Recommendations  None recommended by PT    Recommendations for Other Services       Precautions / Restrictions Precautions Precautions: Fall Restrictions Weight Bearing Restrictions: No     Mobility  Bed Mobility Overal bed mobility: Needs Assistance Bed Mobility: Supine to Sit     Supine to sit: Supervision     General bed mobility comments: able to perform without cues    Transfers Overall transfer level: Needs assistance Equipment used: Rolling walker (2 wheels) Transfers: Sit to/from Stand Sit to Stand: Min assist           General transfer comment: immediate  LOB to Right side and slight anterior upon standing, minA to stabilize, then he is able to steady self with RW.    Ambulation/Gait Ambulation/Gait assistance: Min guard, Min assist Gait Distance (Feet): 100 Feet Assistive device: Rolling walker (2 wheels) Gait Pattern/deviations: Step-through pattern, Shuffle       General Gait Details: generally slow; several LOB, very heavy use of arms required on RW; cues for L inattention and increased leaning to left with turns.   Stairs Stairs: Yes Stairs assistance: Min guard Stair Management: Two rails, Alternating pattern, Step to pattern, Forwards Number of Stairs: 8 General stair comments: step to pattern with fatigue.   Wheelchair Mobility     Tilt Bed    Modified Rankin (Stroke Patients Only)       Balance Overall balance assessment: Needs assistance, History of Falls Sitting-balance support: Bilateral upper extremity supported Sitting balance-Leahy Scale: Good     Standing balance support: Bilateral upper extremity supported, During functional activity, Reliant on assistive device for balance Standing balance-Leahy Scale: Poor                              Cognition Arousal/Alertness: Awake/alert Behavior During Therapy: WFL for tasks assessed/performed Overall Cognitive Status: Within Functional Limits for tasks assessed                                          Exercises  General Comments        Pertinent Vitals/Pain Pain Assessment Pain Assessment: Faces Faces Pain Scale: Hurts little more Pain Location: head Pain Descriptors / Indicators: Aching Pain Intervention(s): Monitored during session    Home Living Family/patient expects to be discharged to:: Private residence Living Arrangements: Spouse/significant other Available Help at Discharge: Family;Available PRN/intermittently Type of Home: House         Home Layout: One level Home Equipment: Agricultural consultant (2  wheels);Cane - single point;Electric scooter;None Additional Comments: Wife work during day    Prior Function            PT Goals (current goals can now be found in the care plan section) Acute Rehab PT Goals Patient Stated Goal: return to home PT Goal Formulation: With patient Time For Goal Achievement: 12/03/22 Potential to Achieve Goals: Good Progress towards PT goals: Progressing toward goals    Frequency    Min 1X/week      PT Plan Current plan remains appropriate    Co-evaluation              AM-PAC PT "6 Clicks" Mobility   Outcome Measure  Help needed turning from your back to your side while in a flat bed without using bedrails?: A Little Help needed moving from lying on your back to sitting on the side of a flat bed without using bedrails?: A Little Help needed moving to and from a bed to a chair (including a wheelchair)?: A Little Help needed standing up from a chair using your arms (e.g., wheelchair or bedside chair)?: A Little Help needed to walk in hospital room?: A Little Help needed climbing 3-5 steps with a railing? : A Little 6 Click Score: 18    End of Session Equipment Utilized During Treatment: Gait belt Activity Tolerance: Patient limited by fatigue Patient left: in bed;with family/visitor present Nurse Communication: Mobility status PT Visit Diagnosis: Difficulty in walking, not elsewhere classified (R26.2);Unsteadiness on feet (R26.81);Other abnormalities of gait and mobility (R26.89)     Time: 1225-1240 PT Time Calculation (min) (ACUTE ONLY): 15 min  Charges:    $Gait Training: 8-22 mins $Therapeutic Activity: 8-22 mins PT General Charges $$ ACUTE PT VISIT: 1 Visit                    Hortencia Conradi, PTA  11/20/22, 1:30 PM

## 2022-11-20 NOTE — TOC Initial Note (Addendum)
Transition of Care [facility]) - Initial/Assessment Note    Patient Details  Name: Jon [name] MRN: [id number] Date of Birth: 19-Feb-1951  [facility] CM/SW Contact:    Allena Katz, LCSW Phone Number: 11/20/2022, 10:52 AM  Clinical Narrative:  Pt admitted from home with wife with a stroke. Pt is active with Dr. Judithann Sheen for primary care. PT recommended rehab for patient. Pt has refused this and wants to go home. CSW offered [facility] to patient. Pt is also refusing home health. Pt reports he is getting around well and would like to do outpatient. Pt reports he has  RW at home and doesn't have any DME needs. Pt states that he would like to go to the [facility] outpatient PT where he reports he would have transportation. Message relayed to MD.                 11:44am  Dr Allena Katz spoke with patient who reports that he is now agreeable to [facility]. Pt reports he has no preference. Referral given to [facility].    Expected Discharge Plan: OP Rehab Barriers to Discharge: Barriers Resolved   Patient Goals and CMS Choice Patient states their goals for this hospitalization and ongoing recovery are:: return home with outpatient CMS Medicare.gov Compare Post Acute Care list provided to:: Patient        Expected Discharge Plan and Services       Living arrangements for the past 2 months: Single Family Home                                      Prior Living Arrangements/Services Living arrangements for the past 2 months: Single Family Home Lives with:: Significant Other   Do you feel safe going back to the place where you live?: Yes        Care giver support system in place?: Yes (comment) Current home services: DME    Activities of Daily Living Home Assistive Devices/Equipment: Dan Humphreys (specify type) ADL Screening (condition at time of admission) Patient's cognitive ability adequate to safely complete daily activities?: Yes Is the patient deaf or have difficulty hearing?: No Does  the patient have difficulty seeing, even when wearing glasses/contacts?: No Does the patient have difficulty concentrating, remembering, or making decisions?: No Patient able to express need for assistance with ADLs?: Yes Does the patient have difficulty dressing or bathing?: No Independently performs ADLs?: Yes (appropriate for developmental age) Communication: Independent Dressing (OT): Needs assistance Is this a change from baseline?: Change from baseline, expected to last <3days Grooming: Independent Feeding: Independent Bathing: Needs assistance Is this a change from baseline?: Change from baseline, expected to last <3 days Toileting: Needs assistance Is this a change from baseline?: Change from baseline, expected to last <3 days In/Out Bed: Needs assistance Is this a change from baseline?: Change from baseline, expected to last <3 days Walks in Home: Needs assistance Is this a change from baseline?: Change from baseline, expected to last <3 days Does the patient have difficulty walking or climbing stairs?: Yes Weakness of Legs: Both Weakness of Arms/Hands: None  Permission Sought/Granted      Share Information with NAME: wife  Permission granted to share info w AGENCY: outpatient pt        Emotional Assessment       Orientation: : Oriented to Self, Oriented to Place, Oriented to  Time, Oriented to Situation  Admission diagnosis:  Stroke (cerebrum) (HCC) [I63.9] Muscle weakness of left upper extremity [M62.81] Dysarthria [R47.1] Weakness of left lower extremity [R29.898] Patient Active Problem List   Diagnosis Date Noted   Altered mental status 11/20/2022   Stroke (cerebrum) (HCC) 11/18/2022   Elevated LFTs 11/18/2022   CVA (cerebral vascular accident) (HCC) 05/13/2019   Migraine 05/12/2019   Depression 05/12/2019   Hypertension    Diabetes mellitus without complication (HCC)    Left arm weakness    Hypogonadism in male 03/13/2019   Pituitary adenoma  (HCC) 03/13/2019   History of stroke 11/29/2018   Hx of adenomatous colonic polyps 11/29/2018   Other nontraumatic intracerebral hemorrhage (HCC) 09/06/2018   Occipital stroke (HCC) 08/19/2018   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   Left-sided weakness 06/02/2018   HTN (hypertension) 06/02/2018   Diabetes (HCC) 06/02/2018   GERD (gastroesophageal reflux disease) 06/02/2018   Migraine without aura and without status migrainosus, not intractable 01/04/2018   Dysarthria 01/04/2018   Headache 07/30/2017   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   Chronic pain of left knee 05/05/2017   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   TIA (transient ischemic attack) 05/01/2017   Pseudophakia of left eye 03/14/2017   OSA (obstructive sleep apnea) 11/02/2016   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   Hyperlipidemia, mixed 04/29/2015   Severe episode of recurrent major depressive disorder, 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/25/2014   Rhytides 01/04/2013   Gross hematuria 04/11/2012   Nephrolithiasis 04/11/2012   Heartburn 11/04/2010   PCP:  Marguarite Arbour, MD Pharmacy:   [facility] [address], [place] - [address] [place] [address] Phone: [phone] Fax: [phone]     Social Determinants of Health (SDOH) Social History: SDOH Screenings   Food Insecurity: No Food Insecurity (11/19/2022)  Housing: Low Risk  (11/19/2022)  Transportation Needs: Unmet Transportation Needs (11/19/2022)  Utilities: Not At Risk (11/19/2022)  Tobacco Use: Low Risk  (11/19/2022)   SDOH Interventions:     Readmission Risk Interventions     No data to display

## 2022-11-20 NOTE — Procedures (Signed)
History: 72 year old male who presented with left-sided weakness and confusion  Sedation: None  Technique: This EEG was acquired with electrodes placed according to the International 10-20 electrode system (including Fp1, Fp2, F3, F4, C3, C4, P3, P4, O1, O2, T3, T4, T5, T6, A1, A2, Fz, Cz, Pz). The following electrodes were missing or displaced: none.   Background: The background is well-organized with a normally distributed posterior dominant rhythm and she is a maximal frequency of 7 to 7.5 Hz.  There is also a normal distribution of anteriorly predominant beta activity. There is anterior shifting of this posterior dominant frequency associated with drowsiness, but sleep is not recorded.  There was no epileptiform activity recorded.  Photic stimulation: Physiologic driving is now performed  EEG Abnormalities: Slow PDR  Clinical Interpretation: This borderline EEG could be suggestive of a mild nonspecific cerebral dysfunction (encephalopathy).  Slow PDR can be seen in normal aging, though he is slightly young for this, making the significance of this unclear.  There was no seizure or seizure predisposition recorded on this study. Please note that lack of epileptiform activity on EEG does not preclude the possibility of epilepsy.   Ritta Slot, MD [facility] [phone]  If 7pm- 7am, please page neurology on call as listed in AMION.

## 2022-11-22 NOTE — Care Management (Signed)
D/w Fermin Schwab PA-C at [facility] cardiology and she will set up cardiac monitor as out pt as requested by Neurology

## 2022-12-10 ENCOUNTER — Emergency Department
Admission: EM | Admit: 2022-12-10 | Discharge: 2022-12-11 | Discharge status: Against Medical Advice | Payer: Medicare Other | Attending: Emergency Medicine | Admitting: Emergency Medicine

## 2022-12-10 ENCOUNTER — Emergency Department: Payer: Medicare Other

## 2022-12-10 ENCOUNTER — Other Ambulatory Visit: Payer: Self-pay

## 2022-12-10 DIAGNOSIS — Z794 Long term (current) use of insulin: Secondary | ICD-10-CM | POA: Diagnosis not present

## 2022-12-10 DIAGNOSIS — Q282 Arteriovenous malformation of cerebral vessels: Secondary | ICD-10-CM

## 2022-12-10 DIAGNOSIS — R471 Dysarthria and anarthria: Secondary | ICD-10-CM | POA: Insufficient documentation

## 2022-12-10 DIAGNOSIS — D649 Anemia, unspecified: Secondary | ICD-10-CM | POA: Diagnosis not present

## 2022-12-10 DIAGNOSIS — D72819 Decreased white blood cell count, unspecified: Secondary | ICD-10-CM | POA: Insufficient documentation

## 2022-12-10 DIAGNOSIS — R55 Syncope and collapse: Secondary | ICD-10-CM | POA: Diagnosis not present

## 2022-12-10 DIAGNOSIS — I10 Essential (primary) hypertension: Secondary | ICD-10-CM | POA: Diagnosis present

## 2022-12-10 DIAGNOSIS — E119 Type 2 diabetes mellitus without complications: Secondary | ICD-10-CM

## 2022-12-10 DIAGNOSIS — R519 Headache, unspecified: Secondary | ICD-10-CM | POA: Diagnosis not present

## 2022-12-10 DIAGNOSIS — K219 Gastro-esophageal reflux disease without esophagitis: Secondary | ICD-10-CM | POA: Diagnosis present

## 2022-12-10 DIAGNOSIS — Z7982 Long term (current) use of aspirin: Secondary | ICD-10-CM | POA: Insufficient documentation

## 2022-12-10 DIAGNOSIS — R7989 Other specified abnormal findings of blood chemistry: Secondary | ICD-10-CM | POA: Diagnosis present

## 2022-12-10 DIAGNOSIS — Z79899 Other long term (current) drug therapy: Secondary | ICD-10-CM | POA: Diagnosis not present

## 2022-12-10 DIAGNOSIS — R531 Weakness: Secondary | ICD-10-CM | POA: Diagnosis present

## 2022-12-10 DIAGNOSIS — G4733 Obstructive sleep apnea (adult) (pediatric): Secondary | ICD-10-CM | POA: Diagnosis present

## 2022-12-10 DIAGNOSIS — R29898 Other symptoms and signs involving the musculoskeletal system: Secondary | ICD-10-CM | POA: Diagnosis present

## 2022-12-10 LAB — URINALYSIS, ROUTINE W REFLEX MICROSCOPIC
Bilirubin Urine: NEGATIVE
Glucose, UA: 50 mg/dL — AB
Hgb urine dipstick: NEGATIVE
Ketones, ur: NEGATIVE mg/dL
Leukocytes,Ua: NEGATIVE
Nitrite: NEGATIVE
Protein, ur: NEGATIVE mg/dL
Specific Gravity, Urine: 1.027 (ref 1.005–1.030)
pH: 5 (ref 5.0–8.0)

## 2022-12-10 LAB — TROPONIN I (HIGH SENSITIVITY)
Troponin I (High Sensitivity): 9 ng/L (ref <18)
Troponin I (High Sensitivity): 14 ng/L (ref <18)

## 2022-12-10 LAB — CBC
HCT: 35.3 % — ABNORMAL LOW (ref 39.0–52.0)
Hemoglobin: 12.6 g/dL — ABNORMAL LOW (ref 13.0–17.0)
MCH: 31.9 pg (ref 26.0–34.0)
MCHC: 35.7 g/dL (ref 30.0–36.0)
MCV: 89.4 fL (ref 80.0–100.0)
Platelets: 182 10*3/uL (ref 150–400)
RBC: 3.95 MIL/uL — ABNORMAL LOW (ref 4.22–5.81)
RDW: 12.6 % (ref 11.5–15.5)
WBC: 3.8 10*3/uL — ABNORMAL LOW (ref 4.0–10.5)
nRBC: 0 % (ref 0.0–0.2)

## 2022-12-10 LAB — BASIC METABOLIC PANEL
Anion gap: 12 (ref 5–15)
BUN: 22 mg/dL (ref 8–23)
CO2: 19 mmol/L — ABNORMAL LOW (ref 22–32)
Calcium: 9.1 mg/dL (ref 8.9–10.3)
Chloride: 106 mmol/L (ref 98–111)
Creatinine, Ser: 0.93 mg/dL (ref 0.61–1.24)
GFR, Estimated: >60 mL/min (ref >60)
Glucose, Bld: 170 mg/dL — ABNORMAL HIGH (ref 70–99)
Potassium: 3.6 mmol/L (ref 3.5–5.1)
Sodium: 137 mmol/L (ref 135–145)

## 2022-12-10 LAB — ETHANOL: Alcohol, Ethyl (B): <10 mg/dL (ref <10)

## 2022-12-10 MED ORDER — IOHEXOL 350 MG/ML SOLN
110.0000 mL | Freq: Once | INTRAVENOUS | Status: AC | PRN
  Administered 2022-12-10: 110 mL via INTRAVENOUS

## 2022-12-10 MED ORDER — FENTANYL CITRATE PF 50 MCG/ML IJ SOSY
25.0000 ug | PREFILLED_SYRINGE | Freq: Once | INTRAMUSCULAR | Status: AC
  Administered 2022-12-10: 25 ug via INTRAVENOUS
  Filled 2022-12-10: qty 1

## 2022-12-10 MED ORDER — ONDANSETRON HCL 4 MG/2ML IJ SOLN
4.0000 mg | INTRAMUSCULAR | Status: AC
  Administered 2022-12-10: 4 mg via INTRAVENOUS
  Filled 2022-12-10: qty 2

## 2022-12-10 NOTE — ED Triage Notes (Signed)
Pt BIB EMS for near syncope--he has been seen recently for stroke. Pt c/o R sided h/a at this time

## 2022-12-10 NOTE — ED Notes (Signed)
Pt taken to CT by this RN on the monitor.

## 2022-12-10 NOTE — ED Notes (Signed)
Dr. Janee Morn (neurology) present on telecart.

## 2022-12-10 NOTE — ED Notes (Signed)
CALLED [facility] AND ACTIVATED A CODE STROKE.

## 2022-12-10 NOTE — ED Notes (Signed)
Pt taken to CT1 by this RN on the monitor.

## 2022-12-10 NOTE — Progress Notes (Addendum)
Code stroke timeline MRS 3 LKW 1800 2044 Code stroke activated, EDP assessment prior to cart activation, NCCT prior to activation TSRN requesting EDP to bedside for report on pt situation 2052 [facility] paged 2057 [facility]SM[facility] on camera No TNK, TSMD ordered CTA/Perfusion 2110 Pt taken to CT, TSRN off camera will monitor for results 2224 [facility] notified of negative stroke imaging

## 2022-12-10 NOTE — ED Notes (Signed)
Teleneuro at the bedside along with Spouse and Dr. Fanny Bien.

## 2022-12-10 NOTE — H&P (Signed)
History and Physical    Patient: Jon [name] VQQ:[id number] DOB: September 04, 1950 DOA: 12/10/2022 DOS: the patient was seen and examined on 12/11/2022 PCP: Marguarite Arbour, MD  Patient coming from: Home   Chief Complaint:  Chief Complaint  Patient presents with   Near Syncope    HPI: EARLIN SWEEDEN is a 72 y.o. male with medical history significant for DM II and HTN coming after having syncope collapse episode while in yard for few minutes.  When the patient became lethargic unresponsive he was also apneic per the wife and that is when wife did cardiac thump and he took deep breath.  Per wife he was out for a few minutes.  No incontinence or tongue biting reported.  Sinus rhythm 75 PR of 168 PACs, QTc of 458. BMP shows bicarb of 19 glucose 170 lactic acid added and ordered pending, abnormal LFTs in the past will  recheck. cbc shows anemia of 12.6 A1c of 8.2 on July 12.  Review of Systems: Review of Systems  Neurological:  Positive for dizziness and loss of consciousness.  All other systems reviewed and are negative.   Past Medical History:  Diagnosis Date   AVM (arteriovenous malformation) brain    Diabetes mellitus without complication (HCC)    Hypertension    Pituitary tumor    TIA (transient ischemic attack)    Past Surgical History:  Procedure Laterality Date   COLONOSCOPY     EYE SURGERY     HERNIA REPAIR     Social History:  reports that he has never smoked. He has never used smokeless tobacco. He reports that he does not drink alcohol and does not use drugs.  Allergies  Allergen Reactions   Beta Adrenergic Blockers Itching   Topiramate Nausea And Vomiting    Other reaction(s): Dizziness, Hallucination Other Reaction: violence   Wasp Venom Anaphylaxis   Aspirin Other (See Comments)    Per pt's PCP due to spontaneous bleeding in his brain.   Benadryl [Diphenhydramine] Hives   Lisinopril Itching    Other reaction(s): Other (See Comments) Leg pain    Nabumetone Other (See Comments)    Reaction: unknown   Valproic Acid Other (See Comments)    Reaction: Headache    Family History  Problem Relation Age of Onset   Diabetes Mellitus II Other        Maternal aunt    Prior to Admission medications   Medication Sig Start Date End Date Taking? Authorizing Provider  acetaminophen (TYLENOL) 500 MG tablet Take 2 tablets (1,000 mg total) by mouth every 6 (six) hours as needed. 06/08/22 06/08/23  Varney Daily, PA  albuterol (VENTOLIN HFA) 108 (90 Base) MCG/ACT inhaler Inhale 2 puffs into the lungs every 6 (six) hours as needed for wheezing or shortness of breath. 02/22/22   Pilar Jarvis, MD  amLODipine (NORVASC) 5 MG tablet Take 5 mg by mouth daily.    [provider]  aspirin EC 81 MG tablet Take 1 tablet (81 mg total) by mouth daily. Swallow whole. 11/21/22   Enedina Finner, MD  atorvastatin (LIPITOR) 80 MG tablet Take 1 tablet (80 mg total) by mouth daily. 11/21/22   Enedina Finner, MD  clonazePAM (KLONOPIN) 0.5 MG tablet Take 0.5 mg by mouth 2 (two) times daily as needed. 08/17/22   [provider]  DULoxetine (CYMBALTA) 30 MG capsule Take 30 mg by mouth daily. 06/30/22   [provider]  EPINEPHrine 0.3 mg/0.3 mL IJ SOAJ injection Inject 0.3  mg into the muscle as needed for anaphylaxis. 02/22/22   Pilar Jarvis, MD  ezetimibe (ZETIA) 10 MG tablet Take 10 mg by mouth daily.    [provider]  fluticasone-salmeterol (ADVAIR) 250-50 MCG/ACT AEPB Inhale 1 puff into the lungs in the morning and at bedtime. 06/30/22 06/30/23  [provider]  glipiZIDE (GLUCOTROL XL) 10 MG 24 hr tablet Take 10 mg by mouth daily.    [provider]  pantoprazole (PROTONIX) 40 MG tablet Take 40 mg by mouth daily. 08/11/22   [provider]  traZODone (DESYREL) 100 MG tablet Take 100 mg by mouth at bedtime. 10/29/22   [provider]     Vitals:   12/10/22 2103 12/10/22 2200 12/11/22 0008 12/11/22 0100   BP: (!) 147/74 (!) 144/80  (!) 142/86  Pulse: 78 68  (!) 46  Resp: 17 14  18   Temp:   98.4 F (36.9 C)   TempSrc:   Oral   SpO2: 100% 100%  97%  Weight:      Height:       Physical Exam Vitals and nursing note reviewed.  Constitutional:      General: He is not in acute distress. HENT:     Head: Normocephalic and atraumatic.     Right Ear: Hearing normal.     Left Ear: Hearing normal.     Nose: Nose normal. No nasal deformity.     Mouth/Throat:     Lips: Pink.     Tongue: No lesions.     Pharynx: Oropharynx is clear.  Eyes:     General: Lids are normal.     Extraocular Movements: Extraocular movements intact.  Cardiovascular:     Rate and Rhythm: Normal rate and regular rhythm.     Heart sounds: Normal heart sounds.  Pulmonary:     Effort: Pulmonary effort is normal.     Breath sounds: Normal breath sounds.  Abdominal:     General: Bowel sounds are normal. There is no distension.     Palpations: Abdomen is soft. There is no mass.     Tenderness: There is no abdominal tenderness.  Musculoskeletal:     Right lower leg: No edema.     Left lower leg: No edema.  Skin:    General: Skin is warm.  Neurological:     General: No focal deficit present.     Mental Status: He is alert and oriented to person, place, and time.     Cranial Nerves: Cranial nerves 2-12 are intact.  Psychiatric:        Attention and Perception: Attention normal.        Mood and Affect: Mood normal.        Speech: Speech normal.        Behavior: Behavior normal. Behavior is cooperative.      Labs on Admission: I have personally reviewed following labs and imaging studies  CBC: Recent Labs  Lab 12/10/22 1945  WBC 3.8*  HGB 12.6*  HCT 35.3*  MCV 89.4  PLT 182   Basic Metabolic Panel: Recent Labs  Lab 12/10/22 1945  NA 137  K 3.6  CL 106  CO2 19*  GLUCOSE 170*  BUN 22  CREATININE 0.93  CALCIUM 9.1   GFR: Estimated Creatinine Clearance: 72.4 mL/min (by C-G formula based on SCr  of 0.93 mg/dL). Liver Function Tests: Recent Labs  Lab 12/10/22 2227  AST 73*  ALT 71*  ALKPHOS 79  BILITOT 0.7  PROT  7.2  ALBUMIN 4.5   No results for input(s): "LIPASE", "AMYLASE" in the last 168 hours. No results for input(s): "AMMONIA" in the last 168 hours. Coagulation Profile: No results for input(s): "INR", "PROTIME" in the last 168 hours. Cardiac Enzymes: No results for input(s): "CKTOTAL", "CKMB", "CKMBINDEX", "TROPONINI" in the last 168 hours. BNP (last 3 results) No results for input(s): "PROBNP" in the last 8760 hours. HbA1C: No results for input(s): "HGBA1C" in the last 72 hours. CBG: No results for input(s): "GLUCAP" in the last 168 hours. Lipid Profile: No results for input(s): "CHOL", "HDL", "LDLCALC", "TRIG", "CHOLHDL", "LDLDIRECT" in the last 72 hours. Thyroid Function Tests: No results for input(s): "TSH", "T4TOTAL", "FREET4", "T3FREE", "THYROIDAB" in the last 72 hours. Anemia Panel: No results for input(s): "VITAMINB12", "FOLATE", "FERRITIN", "TIBC", "IRON", "RETICCTPCT" in the last 72 hours. Urine analysis: Urinalysis    Component Value Date/Time   COLORURINE YELLOW (A) 12/10/2022 2227   APPEARANCEUR CLEAR (A) 12/10/2022 2227   APPEARANCEUR Clear 10/07/2020 0854   LABSPEC 1.027 12/10/2022 2227   PHURINE 5.0 12/10/2022 2227   GLUCOSEU 50 (A) 12/10/2022 2227   HGBUR NEGATIVE 12/10/2022 2227   BILIRUBINUR NEGATIVE 12/10/2022 2227   BILIRUBINUR Negative 10/07/2020 0854   KETONESUR NEGATIVE 12/10/2022 2227   PROTEINUR NEGATIVE 12/10/2022 2227   NITRITE NEGATIVE 12/10/2022 2227   LEUKOCYTESUR NEGATIVE 12/10/2022 2227    Unresulted Labs (From admission, onward)     Start     Ordered   12/11/22 0500  Lipid panel  (Labs)  Tomorrow morning,   R       Comments: Fasting    12/11/22 0006   12/11/22 0051  ANA w/Reflex  Add-on,   AD        12/11/22 0050             Radiological Exams on Admission: CT ANGIO HEAD NECK W WO CM W PERF (CODE  STROKE)  Result Date: 12/10/2022 CLINICAL DATA:  Initial evaluation for neuro deficit, stroke. EXAM: CT ANGIOGRAPHY HEAD AND NECK CT PERFUSION BRAIN TECHNIQUE: Multidetector CT imaging of the head and neck was performed using the standard protocol during bolus administration of intravenous contrast. Multiplanar CT image reconstructions and MIPs were obtained to evaluate the vascular anatomy. Carotid stenosis measurements (when applicable) are obtained utilizing NASCET criteria, using the distal internal carotid diameter as the denominator. Multiphase CT imaging of the brain was performed following IV bolus contrast injection. Subsequent parametric perfusion maps were calculated using RAPID software. RADIATION DOSE REDUCTION: This exam was performed according to the departmental dose-optimization program which includes automated exposure control, adjustment of the mA and/or kV according to patient size and/or use of iterative reconstruction technique. CONTRAST:  OMNIPAQUE IOHEXOL 350 MG/ML SOLN COMPARISON:  CT from earlier the same day. FINDINGS: CTA NECK FINDINGS Aortic arch: Visualized arch within normal limits for caliber with standard branch pattern. No stenosis about the origin the great vessels. Right carotid system: Right common and internal carotid arteries are patent without dissection. Minimal plaque about the right carotid bulb without stenosis. Left carotid system: Left common and internal carotid arteries are patent without dissection. Minimal plaque about the left carotid bulb without stenosis. Vertebral arteries: Both vertebral arteries arise from the subclavian arteries. No proximal subclavian artery stenosis. Right vertebral artery slightly dominant. Vertebral arteries patent without stenosis or dissection. Skeleton: No discrete or worrisome osseous lesions. Other neck: No other acute finding. Upper chest: No other acute finding. Review of the MIP images confirms the above findings CTA HEAD  FINDINGS Anterior circulation: Both internal carotid arteries patent to the termini without stenosis. A1 segments, anterior drawer artery complex common anterior cerebral arteries widely patent. No M1 stenosis or occlusion. No proximal MCA branch occlusion or high-grade stenosis. Distal MCA branches perfused and symmetric. Posterior circulation: Both V4 segments patent without stenosis. Both PICA patent. Basilar patent without stenosis. Superior cerebellar and posterior cerebral arteries patent bilaterally. Venous sinuses: Patent allowing for timing the contrast bolus. Anatomic variants: None significant. There is an abnormal tangle of vessels involving the peripheral left temporal lobe (series 7, image 95). Prominent overlying draining vein. 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 Review of the MIP images confirms the above findings CT Brain Perfusion Findings: CBF (<30%) Volume: 0mL Perfusion (Tmax>6.0s) volume: 0mL Mismatch Volume: 0mL Infarction Location:Negative CT perfusion with no evidence for acute ischemia or other perfusion deficit. IMPRESSION: 1. Negative CTA for large vessel occlusion or other emergent finding. 2. Negative CT perfusion with no evidence for acute ischemia or other perfusion deficit. 3. Abnormal tangle of vessels involving the peripheral left temporal lobe, consistent with an AVM and/or dural AVF. Electronically Signed   By: Rise Mu M.D.   On: 12/10/2022 22:12   DG Chest Port 1 View  Result Date: 12/10/2022 CLINICAL DATA:  Weakness EXAM: PORTABLE CHEST 1 VIEW COMPARISON:  06/08/2022 FINDINGS: Single frontal view of the chest demonstrates an unremarkable cardiac silhouette. No acute airspace disease, effusion, or pneumothorax. No acute bony abnormalities. Prior healed right rib fractures again noted. IMPRESSION: 1. No acute intrathoracic process. Electronically Signed   By: Sharlet Salina M.D.   On: 12/10/2022 20:40   CT Head Wo Contrast  Result Date:  12/10/2022 CLINICAL DATA:  Headache, uncomplicated (Ped 0-17y) EXAM: CT HEAD WITHOUT CONTRAST TECHNIQUE: Contiguous axial images were obtained from the base of the skull through the vertex without intravenous contrast. RADIATION DOSE REDUCTION: This exam was performed according to the departmental dose-optimization program which includes automated exposure control, adjustment of the mA and/or kV according to patient size and/or use of iterative reconstruction technique. COMPARISON:  MRI 11/18/2022 FINDINGS: Brain: There is normal anatomic configuration of the brain. Mild parenchymal volume loss is commensurate with the patient's age. Patchy periventricular white matter changes are again seen, grossly stable since prior examination, possibly reflecting the sequela of small vessel ischemia. 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 No acute intracranial hemorrhage or infarct. No abnormal mass effect or midline shift. No abnormal intra or extra-axial mass lesion or fluid collection. Ventricular size is normal. Stable tiny remote lacunar infarct within the left cerebellar hemisphere. Vascular: No asymmetric hyperdense vasculature within the skull base. Skull: Intact Sinuses/Orbits: No acute finding. Other: Mastoid air cells and middle ear cavities are clear. IMPRESSION: 1. No acute intracranial hemorrhage or infarct. 2. Stable senescent change and remote cortical infarcts as described above. Electronically Signed   By: Helyn Numbers M.D.   On: 12/10/2022 20:29     Data Reviewed: Relevant notes from primary care and specialist visits, past discharge summaries as available in EHR, including Care Everywhere. Prior diagnostic testing as pertinent to current admission diagnoses Updated medications and problem lists for reconciliation ED course, including vitals, labs, imaging, treatment and response to treatment Triage notes, nursing and pharmacy notes and ED  provider's notes Notable results as noted in HPI Assessment and Plan: * Syncope and collapse Patient presenting with after passing out outside his house in the yard. ED suspicion for TIA and  stroke as patient had recent discharge after having stroke and left-sided dual weakness, otherwise nonfocal. Will admit patient for an MRI and also obtain EEG. Differentials also include iatrogenic related hypoglycemia or orthostatic hypotension secondary to Cymbalta. - CVA eval admission with imaging showing : 1. No acute intracranial abnormality. 2. No intracranial large vessel occlusion or hemodynamically significant stenosis in the neck. 3. Unchanged left posterior temporal lobe AVM - Admit with Q4h neurochecks and telemetry   - Obtain MRI brain w/o contrast  - Repeat TTE w/ bubble study per am team as pt had echo 2 weeks ago.  - Obtain A1C, lipid panel, UA w/ reflex   - Start Aspirin 81 mg daily  - Start Lipitor 80 mg daily   -Aspiration and fall precautions.  - BP permissive for the first 48 hours <220  - Glucose permissive for the first 48 hours 140-180   - PT/OT/ST    - Will obtain EEG because of left temporal lobe AVM-avoid quinolones, Wellbutrin bupropion, cephalosporins, tramadol, agents that may cause confusion and precipitate seizures. -Outpatient sleep study.   Elevated LFTs Added hepatic function test and will follow patient has hepatic steatosis on imaging.    Latest Ref Rng & Units 12/10/2022   10:27 PM 11/18/2022    4:03 PM 06/08/2022   11:58 AM  Hepatic Function  Total Protein 6.5 - 8.1 g/dL 7.2  6.9  7.0   Albumin 3.5 - 5.0 g/dL 4.5  4.5  3.9   AST 15 - 41 U/L 73  54  34   ALT 0 - 44 U/L 71  65  50   Alk Phosphatase 38 - 126 U/L 79  79  64   Total Bilirubin 0.3 - 1.2 mg/dL 0.7  1.0  1.2   Bilirubin, Direct 0.0 - 0.2 mg/dL 0.1     Suspect 2/2 to low volume possibly or fatty liver or meds.  We will cut down Lipitor to 40 mg.  RUQ USG on 11/18/2022 shows: Hepatic steatosis  without focal liver lesions.    Left arm weakness Patient has left upper extremity weakness no limitation in range of motion no pain, weakness is just residual and mild.  OSA (obstructive sleep apnea) Outpatient sleep study.  Arteriovenous malformation of cerebral vessels AVM seen in the temporal lobe not sure if this can predispose patient or put him at high risk for any kind of seizure or seizure-like activity.  Will obtain an EEG.  GERD (gastroesophageal reflux disease) Continue with Protonix daily p.o.  Diabetes (HCC) Uncertain if this was a hypoglycemia related syncope lethargy altered mental status episode.  Patient's last A1c was over 8 we will transition patient to an alternative agent may be Januvia upon discharge and discontinue glipizide to prevent hypoglycemic events and related complication.  HTN (hypertension) Blood pressures actually have been soft since patient's arrival.  Home regimen includes amlodipine 5 mg.  Patient states that his blood pressure used to be 180s in the past currently medications only as show amlodipine.  Currently amlodipine held.  Will monitor blood pressure.  Left-sided weakness Mild residual left-sided weakness more so in the upper left upper extremity. PT OT currently working with patient at home.  DVT prophylaxis:  Heparin  Consults:  Neurology  Advance Care Planning:    Code Status: Prior  Family Communication:  Wife at bedside Disposition Plan:  Back to previous home environment  Severity of Illness: The appropriate patient status for this patient is OBSERVATION. Observation status is judged to  be reasonable and necessary in order to provide the required intensity of service to ensure the patient's safety. The patient's presenting symptoms, physical exam findings, and initial radiographic and laboratory data in the context of their medical condition is felt to place them at decreased risk for further clinical deterioration.  Furthermore, it is anticipated that the patient will be medically stable for discharge from the hospital within 2 midnights of admission.   Author: Gertha Calkin, MD 12/11/2022 1:05 AM  For on call review [email or web address].

## 2022-12-10 NOTE — ED Provider Notes (Signed)
[facility] Provider Note    Event Date/Time   First MD Initiated Contact with Patient 12/10/22 1951     (approximate)   History   Near Syncope   HPI {Remember to add pertinent medical, surgical, social, and/or OB history to HPI:1} Thor Nannini Sobiech is a 72 y.o. male  ***       Physical Exam   Triage Vital Signs: ED Triage Vitals  Encounter Vitals Group     BP 12/10/22 1943 (!) 129/40     Systolic BP Percentile --      Diastolic BP Percentile --      Pulse Rate 12/10/22 1943 71     Resp 12/10/22 1943 16     Temp 12/10/22 1943 98 F (36.7 C)     Temp Source 12/10/22 1943 Oral     SpO2 12/10/22 1943 100 %     Weight 12/10/22 1944 182 lb 1.6 oz (82.6 kg)     Height 12/10/22 1944 5\' 6"  (1.676 m)     Head Circumference --      Peak Flow --      Pain Score 12/10/22 1942 10     Pain Loc --      Pain Education --      Exclude from Growth Chart --     Most recent vital signs: Vitals:   12/10/22 1943 12/10/22 2000  BP: (!) 129/40 136/70  Pulse: 71 77  Resp: 16 13  Temp: 98 F (36.7 C)   SpO2: 100% 100%    {Only need to document appropriate and relevant physical exam:1} General: Awake, no distress. *** CV:  Good peripheral perfusion. *** Resp:  Normal effort. *** Abd:  No distention. *** Other:  ***   ED Results / Procedures / Treatments   Labs (all labs ordered are listed, but only abnormal results are displayed) Labs Reviewed  BASIC METABOLIC PANEL - Abnormal; Notable for the following components:      Result Value   CO2 19 (*)    Glucose, Bld 170 (*)    All other components within normal limits  CBC - Abnormal; Notable for the following components:   WBC 3.8 (*)    RBC 3.95 (*)    Hemoglobin 12.6 (*)    HCT 35.3 (*)    All other components within normal limits  TROPONIN I (HIGH SENSITIVITY)     EKG  ***   RADIOLOGY *** {USE THE WORD "INTERPRETED"!! You MUST document your own interpretation of imaging, as well as  the fact that you reviewed the radiologist's report!:1}   PROCEDURES:  Critical Care performed: {CriticalCareYesNo:19197::"Yes, see critical care procedure note(s)","No"}  Procedures   MEDICATIONS ORDERED IN ED: Medications  fentaNYL (SUBLIMAZE) injection 25 mcg (25 mcg Intravenous Given 12/10/22 2021)  ondansetron (ZOFRAN) injection 4 mg (4 mg Intravenous Given 12/10/22 2021)     IMPRESSION / MDM / ASSESSMENT AND PLAN / ED COURSE  I reviewed the triage vital signs and the nursing notes.                              Differential diagnosis includes, but is not limited to, ***  Patient's presentation is most consistent with {EM COPA:27473}  *** {If the patient is on the monitor, remove the brackets and asterisks on the sentence below and remember to document it as a Procedure as well. Otherwise delete the sentence below:1} {**The patient is on  the cardiac monitor to evaluate for evidence of arrhythmia and/or significant heart rate changes.**} {Remember to include, when applicable, any/all of the following data: independent review of imaging independent review of labs (comment specifically on pertinent positives and negatives) review of specific prior hospitalizations, PCP/specialist notes, etc. discuss meds given and prescribed document any discussion with consultants (including hospitalists) any clinical decision tools you used and why (PECARN, NEXUS, etc.) did you consider admitting the patient? document social determinants of health affecting patient's care (homelessness, inability to follow up in a timely fashion, etc) document any pre-existing conditions increasing risk on current visit (e.g. diabetes and HTN increasing danger of high-risk chest pain/ACS) describes what meds you gave (especially parenteral) and why any other interventions?:1} Clinical Course as of 12/10/22 2053  Fri Dec 10, 2022  1959 Stat CT head requested to CT department [MQ]  2040 CT of the head  unrevealing for acute process.  At this juncture given his examination findings and recent proximity to a ischemic stroke I have decided to activate an acute code stroke.  There is no evidence of intracranial hemorrhage for which I was highly concerned of on his initial examination but today with his CT scan performed within 4 hours of symptom onset it seems highly unlikely that he has an acute hemorrhage, subarachnoid etc.  Wish to receive stat consultation recommendations from neurology [MQ]  2041 Nursing unit secretary notified of code stroke process request stat activation [MQ]    Clinical Course User Index [MQ] Sharyn Creamer, MD     FINAL CLINICAL IMPRESSION(S) / ED DIAGNOSES   Final diagnoses:  None     Rx / DC Orders   ED Discharge Orders     None        Note:  This document was prepared using Dragon voice recognition software and may include unintentional dictation errors.

## 2022-12-10 NOTE — ED Notes (Signed)
Per Wife, the patient was "at his baseline" ~ 1800.

## 2022-12-10 NOTE — Consult Note (Signed)
[facility] Tele[facility]urology Consult Services   Patient Name:   Jon [name], Jon [name] Date of Birth:   12-21-1950 Identification Number:   MRN - [id number] Date of Service:   12/10/2022 20:52:33  Diagnosis:       I63.89 - Cerebrovascular accident (CVA) due to other mechanism [facility])  Impression:      72 years old male whom the stroke code is called for worsening expressive aphasia and dysarthria.  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  - CTA: no LVO  - CTP: no perfusion defect   Patient with acute worsening dysarthria and expressive aphasia, ddx includes acute stroke vs recrudescence of prior stroke    Recommendations:  - Admit with Q4h neurochecks and telemetry    - Obtain MRI brain w/o  - Obtain TTE w/ bubble study  - Obtain A1C, lipid panel, UA w/ reflex    - Start Aspirin 81 mg daily  - Start Lipitor 80 mg daily    - BP permissive for the first 48 hours <220  - Glucose permissive for the first 48 hours 140-180    - PT/OT/ST    - If any neuro change, please contact neurology/primary team immediately    - Neurology to follow      ------------------------------------------------------------------------------  Advanced Imaging: CTA Head and Neck Completed.  CTP Completed.  LVO:No  Patient in not a candidate for NIR   Metrics: Last Known Well: 12/10/2022 18:00:00 [facility] Notification Time: 12/10/2022 20:52:33 Arrival Time: 12/10/2022 19:29:00 Stamp Time: 12/10/2022 20:52:33 Initial Response Time: 12/10/2022 20:57:26 Symptoms: dysarthria. Initial patient interaction: 12/10/2022 20:59:46 NIHSS Assessment Completed: 12/10/2022 21:02:31 Patient is not a candidate for Thrombolytic. Thrombolytic Medical Decision: 12/10/2022 20:59:36 Patient was not deemed candidate for Thrombolytic because of following reasons: Stroke in last 3 months.  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  Patient was brought by EMS for symptoms of dysarthria. 72 years old male whom the stroke code is called for worsening expressive aphasia and dysarthria.  Of note, patient has history of recent stroke on 11/2022 with residual left sided weakness, dysarthria and expressive aphasia. Patient able to carry out a conversation relatively well with no significant difficulty. However, since 6 PM today, he has had acute worsening of dysarthria and expressive aphasia.  On presentation, hemodynamically stable. NIHSS 6. 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 CTA with no LVO. CTP with no perfusion defect.   Past Medical History:      Stroke  Medications:  No Anticoagulant use  Antiplatelet use: Yes Aspirin 81 mg daily Reviewed EMR for current medications  Allergies:  Reviewed  Social History: Patient Is: Married Smoking: No Alcohol Use: No Drug Use: No  Family History:  There is no family history of premature cerebrovascular disease pertinent to this consultation  ROS : 14 Points Review of Systems was performed and was negative except mentioned in HPI.  Past Surgical History: There Is No Surgical History Contributory To Today's Visit    Examination: BP(147/74), Pulse(78), 1A: Level of Consciousness - Alert; keenly responsive + 0 1B: Ask Month and Age - Both Questions Right + 0 1C: Blink  Eyes & Squeeze Hands - Performs Both Tasks + 0 2: Test Horizontal Extraocular Movements - Normal + 0 3: Test Visual Fields - No Visual Loss + 0 4: Test Facial Palsy (Use Grimace if  Obtunded) - Partial paralysis (lower face) + 2 5A: Test Left Arm Motor Drift - No Drift for 10 Seconds + 0 5B: Test Right Arm Motor Drift - Drift, but doesn't hit bed + 1 6A: Test Left Leg Motor Drift - Drift, but doesn't hit bed + 1 6B: Test Right Leg Motor Drift - No Drift for 5 Seconds + 0 7: Test Limb Ataxia (FNF/Heel-Shin) - No Ataxia + 0 8: Test Sensation - Normal; No sensory loss + 0 9: Test Language/Aphasia - Mild-Moderate Aphasia: Some Obvious Changes, Without Significant Limitation + 1 10: Test Dysarthria - Mild-Moderate Dysarthria: Slurring but can be understood + 1 11: Test Extinction/Inattention - No abnormality + 0  NIHSS Score: 6   Pre-Morbid Modified Rankin Scale: 2 Points = Slight disability; unable to carry out all previous activities, but able to look after own affairs without assistance   This consult was conducted in real time using interactive audio and Immunologist. Patient was informed of the technology being used for this visit and agreed to proceed. Patient located in hospital and provider located at home/office setting.   Patient is being evaluated for possible acute neurologic impairment and high probability of imminent or life-threatening deterioration. I spent total of 53 minutes providing care to this patient, including time for face to face visit via telemedicine, review of medical records, imaging studies and discussion of findings with providers, the patient and/or family.   Dr Corinna Capra   [facility] For Inpatient follow-up with [facility] physician please call [facility] [phone]. This is not an outpatient service. Post hospital discharge, please contact hospital directly.  Please do not communicate with [facility] physicians via secure chat. If you have any questions, Please contact [facility]. Please call or reconsult our service if there are any clinical or diagnostic changes.

## 2022-12-10 NOTE — H&P (Incomplete)
History and Physical    Patient: Jon [name] UJW:[id number] DOB: 04/08/1951 DOA: 12/10/2022 DOS: the patient was seen and examined on 12/10/2022 PCP: Marguarite Arbour, MD  Patient coming from: Home   Chief Complaint:  Chief Complaint  Patient presents with  . Near Syncope    HPI: Jon [name] is a 72 y.o. male with medical history significant for DM II and HTN coming after having syncope collapse episode while in yard for few minutes.  Pt came to when wife did cardiac thump and he took deep breath.    Sinus rhythm 75 PR of 168 PACs, QTc of 458. BMP shows bicarb of 19 glucose 170 lactic acid added and ordered pending, abnormal LFTs in the past will  recheck. cbc shows anemia of 12.6 A1c of 8.2 on July 12.  Review of Systems: ROS   Past Medical History:  Diagnosis Date  . AVM (arteriovenous malformation) brain   . Diabetes mellitus without complication (HCC)   . Hypertension   . Pituitary tumor   . TIA (transient ischemic attack)    Past Surgical History:  Procedure Laterality Date  . COLONOSCOPY    . EYE SURGERY    . HERNIA REPAIR     Social History:  reports that he has never smoked. He has never used smokeless tobacco. He reports that he does not drink alcohol and does not use drugs.  Allergies  Allergen Reactions  . Beta Adrenergic Blockers Itching  . Topiramate Nausea And Vomiting    Other reaction(s): Dizziness, Hallucination Other Reaction: violence  . Wasp Venom Anaphylaxis  . Aspirin Other (See Comments)    Per pt's PCP due to spontaneous bleeding in his brain.  . Benadryl [Diphenhydramine] Hives  . Lisinopril Itching    Other reaction(s): Other (See Comments) Leg pain  . Nabumetone Other (See Comments)    Reaction: unknown  . Valproic Acid Other (See Comments)    Reaction: Headache    Family History  Problem Relation Age of Onset  . Diabetes Mellitus II Other        Maternal aunt    Prior to Admission medications   Medication Sig Start  Date End Date Taking? Authorizing Provider  acetaminophen (TYLENOL) 500 MG tablet Take 2 tablets (1,000 mg total) by mouth every 6 (six) hours as needed. 06/08/22 06/08/23  Varney Daily, PA  albuterol (VENTOLIN HFA) 108 (90 Base) MCG/ACT inhaler Inhale 2 puffs into the lungs every 6 (six) hours as needed for wheezing or shortness of breath. 02/22/22   Pilar Jarvis, MD  amLODipine (NORVASC) 5 MG tablet Take 5 mg by mouth daily.    [provider]  aspirin EC 81 MG tablet Take 1 tablet (81 mg total) by mouth daily. Swallow whole. 11/21/22   Enedina Finner, MD  atorvastatin (LIPITOR) 80 MG tablet Take 1 tablet (80 mg total) by mouth daily. 11/21/22   Enedina Finner, MD  clonazePAM (KLONOPIN) 0.5 MG tablet Take 0.5 mg by mouth 2 (two) times daily as needed. 08/17/22   [provider]  DULoxetine (CYMBALTA) 30 MG capsule Take 30 mg by mouth daily. 06/30/22   [provider]  EPINEPHrine 0.3 mg/0.3 mL IJ SOAJ injection Inject 0.3 mg into the muscle as needed for anaphylaxis. 02/22/22   Pilar Jarvis, MD  ezetimibe (ZETIA) 10 MG tablet Take 10 mg by mouth daily.    [provider]  fluticasone-salmeterol (ADVAIR) 250-50 MCG/ACT AEPB Inhale 1 puff into the lungs in the morning and  at bedtime. 06/30/22 06/30/23  [provider]  glipiZIDE (GLUCOTROL XL) 10 MG 24 hr tablet Take 10 mg by mouth daily.    [provider]  pantoprazole (PROTONIX) 40 MG tablet Take 40 mg by mouth daily. 08/11/22   [provider]  traZODone (DESYREL) 100 MG tablet Take 100 mg by mouth at bedtime. 10/29/22   [provider]     Vitals:   12/10/22 1944 12/10/22 2000 12/10/22 2103 12/10/22 2200  BP:  136/70 (!) 147/74 (!) 144/80  Pulse:  77 78 68  Resp:  13 17 14   Temp:      TempSrc:      SpO2:  100% 100% 100%  Weight: 82.6 kg     Height: 5\' 6"  (1.676 m)      Physical Exam   Labs on Admission: I have personally reviewed following labs and imaging  studies  CBC: Recent Labs  Lab 12/10/22 1945  WBC 3.8*  HGB 12.6*  HCT 35.3*  MCV 89.4  PLT 182   Basic Metabolic Panel: Recent Labs  Lab 12/10/22 1945  NA 137  K 3.6  CL 106  CO2 19*  GLUCOSE 170*  BUN 22  CREATININE 0.93  CALCIUM 9.1   GFR: Estimated Creatinine Clearance: 72.4 mL/min (by C-G formula based on SCr of 0.93 mg/dL). Liver Function Tests: No results for input(s): "AST", "ALT", "ALKPHOS", "BILITOT", "PROT", "ALBUMIN" in the last 168 hours. No results for input(s): "LIPASE", "AMYLASE" in the last 168 hours. No results for input(s): "AMMONIA" in the last 168 hours. Coagulation Profile: No results for input(s): "INR", "PROTIME" in the last 168 hours. Cardiac Enzymes: No results for input(s): "CKTOTAL", "CKMB", "CKMBINDEX", "TROPONINI" in the last 168 hours. BNP (last 3 results) No results for input(s): "PROBNP" in the last 8760 hours. HbA1C: No results for input(s): "HGBA1C" in the last 72 hours. CBG: No results for input(s): "GLUCAP" in the last 168 hours. Lipid Profile: No results for input(s): "CHOL", "HDL", "LDLCALC", "TRIG", "CHOLHDL", "LDLDIRECT" in the last 72 hours. Thyroid Function Tests: No results for input(s): "TSH", "T4TOTAL", "FREET4", "T3FREE", "THYROIDAB" in the last 72 hours. Anemia Panel: No results for input(s): "VITAMINB12", "FOLATE", "FERRITIN", "TIBC", "IRON", "RETICCTPCT" in the last 72 hours. Urine analysis: Urinalysis    Component Value Date/Time   COLORURINE YELLOW (A) 12/10/2022 2227   APPEARANCEUR CLEAR (A) 12/10/2022 2227   APPEARANCEUR Clear 10/07/2020 0854   LABSPEC 1.027 12/10/2022 2227   PHURINE 5.0 12/10/2022 2227   GLUCOSEU 50 (A) 12/10/2022 2227   HGBUR NEGATIVE 12/10/2022 2227   BILIRUBINUR NEGATIVE 12/10/2022 2227   BILIRUBINUR Negative 10/07/2020 0854   KETONESUR NEGATIVE 12/10/2022 2227   PROTEINUR NEGATIVE 12/10/2022 2227   NITRITE NEGATIVE 12/10/2022 2227   LEUKOCYTESUR NEGATIVE 12/10/2022 2227     Unresulted Labs (From admission, onward)     Start     Ordered   12/10/22 2343  Lactic acid, plasma  (Lactic Acid)  STAT Now then every 3 hours,   STAT      12/10/22 2342   12/10/22 2343  Hepatic function panel  Add-on,   AD        12/10/22 2342   12/10/22 2040  Urine Drug Screen, Qualitative  Once,   URGENT        12/10/22 2040             Radiological Exams on Admission: CT ANGIO HEAD NECK W WO CM W PERF (CODE STROKE)  Result Date: 12/10/2022 CLINICAL DATA:  Initial  evaluation for neuro deficit, stroke. EXAM: CT ANGIOGRAPHY HEAD AND NECK CT PERFUSION BRAIN TECHNIQUE: Multidetector CT imaging of the head and neck was performed using the standard protocol during bolus administration of intravenous contrast. Multiplanar CT image reconstructions and MIPs were obtained to evaluate the vascular anatomy. Carotid stenosis measurements (when applicable) are obtained utilizing NASCET criteria, using the distal internal carotid diameter as the denominator. Multiphase CT imaging of the brain was performed following IV bolus contrast injection. Subsequent parametric perfusion maps were calculated using RAPID software. RADIATION DOSE REDUCTION: This exam was performed according to the departmental dose-optimization program which includes automated exposure control, adjustment of the mA and/or kV according to patient size and/or use of iterative reconstruction technique. CONTRAST:  OMNIPAQUE IOHEXOL 350 MG/ML SOLN COMPARISON:  CT from earlier the same day. FINDINGS: CTA NECK FINDINGS Aortic arch: Visualized arch within normal limits for caliber with standard branch pattern. No stenosis about the origin the great vessels. Right carotid system: Right common and internal carotid arteries are patent without dissection. Minimal plaque about the right carotid bulb without stenosis. Left carotid system: Left common and internal carotid arteries are patent without dissection. Minimal plaque about the left  carotid bulb without stenosis. Vertebral arteries: Both vertebral arteries arise from the subclavian arteries. No proximal subclavian artery stenosis. Right vertebral artery slightly dominant. Vertebral arteries patent without stenosis or dissection. Skeleton: No discrete or worrisome osseous lesions. Other neck: No other acute finding. Upper chest: No other acute finding. Review of the MIP images confirms the above findings CTA HEAD FINDINGS Anterior circulation: Both internal carotid arteries patent to the termini without stenosis. A1 segments, anterior drawer artery complex common anterior cerebral arteries widely patent. No M1 stenosis or occlusion. No proximal MCA branch occlusion or high-grade stenosis. Distal MCA branches perfused and symmetric. Posterior circulation: Both V4 segments patent without stenosis. Both PICA patent. Basilar patent without stenosis. Superior cerebellar and posterior cerebral arteries patent bilaterally. Venous sinuses: Patent allowing for timing the contrast bolus. Anatomic variants: None significant. There is an abnormal tangle of vessels involving the peripheral left temporal lobe (series 7, image 95). Prominent overlying draining vein. 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 Review of the MIP images confirms the above findings CT Brain Perfusion Findings: CBF (<30%) Volume: 0mL Perfusion (Tmax>6.0s) volume: 0mL Mismatch Volume: 0mL Infarction Location:Negative CT perfusion with no evidence for acute ischemia or other perfusion deficit. IMPRESSION: 1. Negative CTA for large vessel occlusion or other emergent finding. 2. Negative CT perfusion with no evidence for acute ischemia or other perfusion deficit. 3. Abnormal tangle of vessels involving the peripheral left temporal lobe, consistent with an AVM and/or dural AVF. Electronically Signed   By: Rise Mu M.D.   On: 12/10/2022 22:12   DG Chest Port 1 View  Result Date: 12/10/2022 CLINICAL DATA:  Weakness  EXAM: PORTABLE CHEST 1 VIEW COMPARISON:  06/08/2022 FINDINGS: Single frontal view of the chest demonstrates an unremarkable cardiac silhouette. No acute airspace disease, effusion, or pneumothorax. No acute bony abnormalities. Prior healed right rib fractures again noted. IMPRESSION: 1. No acute intrathoracic process. Electronically Signed   By: Sharlet Salina M.D.   On: 12/10/2022 20:40   CT Head Wo Contrast  Result Date: 12/10/2022 CLINICAL DATA:  Headache, uncomplicated (Ped 0-17y) EXAM: CT HEAD WITHOUT CONTRAST TECHNIQUE: Contiguous axial images were obtained from the base of the skull through the vertex without intravenous contrast. RADIATION DOSE REDUCTION: This exam was performed according to the departmental dose-optimization program which includes  automated exposure control, adjustment of the mA and/or kV according to patient size and/or use of iterative reconstruction technique. COMPARISON:  MRI 11/18/2022 FINDINGS: Brain: There is normal anatomic configuration of the brain. Mild parenchymal volume loss is commensurate with the patient's age. Patchy periventricular white matter changes are again seen, grossly stable since prior examination, possibly reflecting the sequela of small vessel ischemia. 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 No acute intracranial hemorrhage or infarct. No abnormal mass effect or midline shift. No abnormal intra or extra-axial mass lesion or fluid collection. Ventricular size is normal. Stable tiny remote lacunar infarct within the left cerebellar hemisphere. Vascular: No asymmetric hyperdense vasculature within the skull base. Skull: Intact Sinuses/Orbits: No acute finding. Other: Mastoid air cells and middle ear cavities are clear. IMPRESSION: 1. No acute intracranial hemorrhage or infarct. 2. Stable senescent change and remote cortical infarcts as described above. Electronically Signed   By: Helyn Numbers M.D.    On: 12/10/2022 20:29     Data Reviewed: Relevant notes from primary care and specialist visits, past discharge summaries as available in EHR, including Care Everywhere. Prior diagnostic testing as pertinent to current admission diagnoses Updated medications and problem lists for reconciliation ED course, including vitals, labs, imaging, treatment and response to treatment Triage notes, nursing and pharmacy notes and ED provider's notes Notable results as noted in HPI Assessment and Plan: No notes have been filed under this hospital service. Service: Hospitalist  >> Syncope and collapse: - Admit with Q4h neurochecks and telemetry   - Obtain MRI brain w/o  - Obtain TTE w/ bubble study  - Obtain A1C, lipid panel, UA w/ reflex   - Start Aspirin 81 mg daily  - Start Lipitor 80 mg daily   - BP permissive for the first 48 hours <220  - Glucose permissive for the first 48 hours 140-180   - PT/OT/ST        DVT prophylaxis:  ***  Consults:  ***  Advance Care Planning:    Code Status: Prior *** Family Communication:  *** Disposition Plan:  Back to previous home environment  Severity of Illness: {Observation/Inpatient:21159}  Author: Gertha Calkin, MD 12/10/2022 11:53 PM  For on call review [email or web address].

## 2022-12-10 NOTE — ED Notes (Signed)
Pt brought back from CT by this RN. Call bell within reach.

## 2022-12-11 ENCOUNTER — Emergency Department: Payer: Medicare Other

## 2022-12-11 DIAGNOSIS — R55 Syncope and collapse: Secondary | ICD-10-CM | POA: Diagnosis present

## 2022-12-11 MED ORDER — INSULIN ASPART 100 UNIT/ML IJ SOLN: 0.0000 [IU] | Freq: Three times a day (TID) | INTRAMUSCULAR | Status: DC

## 2022-12-11 MED ORDER — ATORVASTATIN CALCIUM 20 MG PO TABS: 80.0000 mg | ORAL_TABLET | Freq: Every day | ORAL | Status: DC

## 2022-12-11 MED ORDER — HEPARIN SODIUM (PORCINE) 5000 UNIT/ML IJ SOLN: 5000.0000 [IU] | Freq: Three times a day (TID) | INTRAMUSCULAR | Status: DC

## 2022-12-11 MED ORDER — STROKE: EARLY STAGES OF RECOVERY BOOK: Freq: Once | Status: DC

## 2022-12-11 MED ORDER — ATORVASTATIN CALCIUM 20 MG PO TABS: 40.0000 mg | ORAL_TABLET | Freq: Every day | ORAL | Status: DC

## 2022-12-11 MED ORDER — EZETIMIBE 10 MG PO TABS: 10.0000 mg | ORAL_TABLET | Freq: Every day | ORAL | Status: DC

## 2022-12-11 MED ORDER — ASPIRIN 81 MG PO TBEC: 81.0000 mg | DELAYED_RELEASE_TABLET | Freq: Every day | ORAL | Status: DC

## 2022-12-11 MED ORDER — MOMETASONE FURO-FORMOTEROL FUM 200-5 MCG/ACT IN AERO
2.0000 | INHALATION_SPRAY | Freq: Two times a day (BID) | RESPIRATORY_TRACT | Status: DC
  Filled 2022-12-11: qty 8.8

## 2022-12-11 MED ORDER — ALBUTEROL SULFATE (2.5 MG/3ML) 0.083% IN NEBU: 2.5000 mg | INHALATION_SOLUTION | Freq: Four times a day (QID) | RESPIRATORY_TRACT | Status: DC | PRN

## 2022-12-11 MED ORDER — PANTOPRAZOLE SODIUM 40 MG IV SOLR: 40.0000 mg | Freq: Two times a day (BID) | INTRAVENOUS | Status: DC

## 2022-12-11 NOTE — Assessment & Plan Note (Signed)
Blood pressures actually have been soft since patient's arrival.  Home regimen includes amlodipine 5 mg.  Patient states that his blood pressure used to be 180s in the past currently medications only as show amlodipine.  Currently amlodipine held.  Will monitor blood pressure.

## 2022-12-11 NOTE — Assessment & Plan Note (Signed)
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  Will obtain an EEG.

## 2022-12-11 NOTE — Assessment & Plan Note (Addendum)
Patient presenting with after passing out outside his house in the yard. ED suspicion for TIA and stroke as patient had recent discharge after having stroke and left-sided dual weakness, otherwise nonfocal. Will admit patient for an MRI and also obtain EEG. Differentials also include iatrogenic related hypoglycemia or orthostatic hypotension secondary to Cymbalta. - CVA eval admission with imaging showing : 1. No acute intracranial abnormality. 2. No intracranial large vessel occlusion or hemodynamically significant stenosis in the neck. 3. Unchanged left posterior temporal lobe AVM - Admit with Q4h neurochecks and telemetry   - Obtain MRI brain w/o contrast  - Repeat TTE w/ bubble study per am team as pt had echo 2 weeks ago.  - Obtain A1C, lipid panel, UA w/ reflex   - Start Aspirin 81 mg daily  - Start Lipitor 80 mg daily   -Aspiration and fall precautions.  - BP permissive for the first 48 hours <220  - Glucose permissive for the first 48 hours 140-180   - PT/OT/ST    - Will obtain EEG because of left temporal lobe AVM-avoid quinolones, Wellbutrin bupropion, cephalosporins, tramadol, agents that may cause confusion and precipitate seizures. -Outpatient sleep study.

## 2022-12-11 NOTE — ED Notes (Signed)
Dr. Allena Katz at bedside with this nurse. Pt pulling his IV out, reports he wants to go home. Dr. Allena Katz discussing with pt the importance to follow the recommendation for further evaluation. Pt refuses reports he wants to go home. Pulled IV this RN placed 4x4, pt signed AMA form, verbalizes understanding of leaving against medical advice. Explained to pt and pt's wife.

## 2022-12-11 NOTE — Progress Notes (Signed)
Pt was adamant about leaving wife said that he mentioned about getting same test again and again and I  apologized for any inconvenience , trouble or communication or any issue and d/w them that repeat cva concern is reason and also to d./w pcp to change to [place] and also get mri of brain. Wife states he has dome this before and its not [place]. D/W pt to keep blood sugar checks and make pcp appt. To come back for any needs or condition worsens.

## 2022-12-11 NOTE — Assessment & Plan Note (Signed)
Continue with Protonix daily p.o.

## 2022-12-11 NOTE — Assessment & Plan Note (Signed)
Mild residual left-sided weakness more so in the upper left upper extremity. PT OT currently working with patient at home.

## 2022-12-11 NOTE — Assessment & Plan Note (Signed)
Outpatient sleep study  

## 2022-12-11 NOTE — Assessment & Plan Note (Addendum)
Added hepatic function test and will follow patient has hepatic steatosis on imaging.    Latest Ref Rng & Units 12/10/2022   10:27 PM 11/18/2022    4:03 PM 06/08/2022   11:58 AM  Hepatic Function  Total Protein 6.5 - 8.1 g/dL 7.2  6.9  7.0   Albumin 3.5 - 5.0 g/dL 4.5  4.5  3.9   AST 15 - 41 U/L 73  54  34   ALT 0 - 44 U/L 71  65  50   Alk Phosphatase 38 - 126 U/L 79  79  64   Total Bilirubin 0.3 - 1.2 mg/dL 0.7  1.0  1.2   Bilirubin, Direct 0.0 - 0.2 mg/dL 0.1     Suspect 2/2 to low volume possibly or fatty liver or meds.  We will cut down Lipitor to 40 mg.  RUQ USG on 11/18/2022 shows: Hepatic steatosis without focal liver lesions.

## 2022-12-11 NOTE — Assessment & Plan Note (Signed)
Patient has left upper extremity weakness no limitation in range of motion no pain, weakness is just residual and mild.

## 2022-12-11 NOTE — Progress Notes (Cosign Needed)
Pt refused MRI; stated wanted to get out of here

## 2022-12-11 NOTE — Assessment & Plan Note (Signed)
Uncertain if this was a hypoglycemia related syncope lethargy altered mental status episode.  Patient's last A1c was over 8 we will transition patient to an alternative agent may be Januvia upon discharge and discontinue glipizide to prevent hypoglycemic events and related complication.

## 2023-05-04 ENCOUNTER — Emergency Department: Payer: Medicare Other

## 2023-05-04 ENCOUNTER — Emergency Department
Admission: EM | Admit: 2023-05-04 | Discharge: 2023-05-04 | Disposition: A | Discharge status: Home / Self Care | Payer: Medicare Other | Attending: Emergency Medicine | Admitting: Emergency Medicine

## 2023-05-04 ENCOUNTER — Encounter: Payer: Self-pay | Admitting: Emergency Medicine

## 2023-05-04 DIAGNOSIS — E119 Type 2 diabetes mellitus without complications: Secondary | ICD-10-CM | POA: Insufficient documentation

## 2023-05-04 DIAGNOSIS — I10 Essential (primary) hypertension: Secondary | ICD-10-CM | POA: Diagnosis not present

## 2023-05-04 DIAGNOSIS — R55 Syncope and collapse: Secondary | ICD-10-CM | POA: Diagnosis present

## 2023-05-04 DIAGNOSIS — Z7982 Long term (current) use of aspirin: Secondary | ICD-10-CM | POA: Diagnosis not present

## 2023-05-04 DIAGNOSIS — Z79899 Other long term (current) drug therapy: Secondary | ICD-10-CM | POA: Diagnosis not present

## 2023-05-04 DIAGNOSIS — F410 Panic disorder [episodic paroxysmal anxiety] without agoraphobia: Secondary | ICD-10-CM | POA: Insufficient documentation

## 2023-05-04 DIAGNOSIS — Z8673 Personal history of transient ischemic attack (TIA), and cerebral infarction without residual deficits: Secondary | ICD-10-CM | POA: Diagnosis not present

## 2023-05-04 LAB — COMPREHENSIVE METABOLIC PANEL
ALT: 35 U/L (ref 0–44)
AST: 34 U/L (ref 15–41)
Albumin: 4.6 g/dL (ref 3.5–5.0)
Alkaline Phosphatase: 89 U/L (ref 38–126)
Anion gap: 11 (ref 5–15)
BUN: 16 mg/dL (ref 8–23)
CO2: 23 mmol/L (ref 22–32)
Calcium: 8.9 mg/dL (ref 8.9–10.3)
Chloride: 103 mmol/L (ref 98–111)
Creatinine, Ser: 0.95 mg/dL (ref 0.61–1.24)
GFR, Estimated: >60 mL/min (ref >60)
Glucose, Bld: 112 mg/dL — ABNORMAL HIGH (ref 70–99)
Potassium: 4.2 mmol/L (ref 3.5–5.1)
Sodium: 137 mmol/L (ref 135–145)
Total Bilirubin: 0.8 mg/dL (ref <1.2)
Total Protein: 7.7 g/dL (ref 6.5–8.1)

## 2023-05-04 LAB — URINALYSIS, ROUTINE W REFLEX MICROSCOPIC
Bilirubin Urine: NEGATIVE
Glucose, UA: NEGATIVE mg/dL
Hgb urine dipstick: NEGATIVE
Ketones, ur: 5 mg/dL — AB
Leukocytes,Ua: NEGATIVE
Nitrite: NEGATIVE
Protein, ur: NEGATIVE mg/dL
Specific Gravity, Urine: 1.045 — ABNORMAL HIGH (ref 1.005–1.030)
pH: 6 (ref 5.0–8.0)

## 2023-05-04 LAB — CBC WITH DIFFERENTIAL/PLATELET
Abs Immature Granulocytes: 0.02 10*3/uL (ref 0.00–0.07)
Basophils Absolute: 0 10*3/uL (ref 0.0–0.1)
Basophils Relative: 0 %
Eosinophils Absolute: 0.1 10*3/uL (ref 0.0–0.5)
Eosinophils Relative: 2 %
HCT: 45.6 % (ref 39.0–52.0)
Hemoglobin: 15.8 g/dL (ref 13.0–17.0)
Immature Granulocytes: 0 %
Lymphocytes Relative: 34 %
Lymphs Abs: 2.6 10*3/uL (ref 0.7–4.0)
MCH: 32 pg (ref 26.0–34.0)
MCHC: 34.6 g/dL (ref 30.0–36.0)
MCV: 92.3 fL (ref 80.0–100.0)
Monocytes Absolute: 0.7 10*3/uL (ref 0.1–1.0)
Monocytes Relative: 9 %
Neutro Abs: 4.1 10*3/uL (ref 1.7–7.7)
Neutrophils Relative %: 55 %
Platelets: 234 10*3/uL (ref 150–400)
RBC: 4.94 MIL/uL (ref 4.22–5.81)
RDW: 13.2 % (ref 11.5–15.5)
WBC: 7.5 10*3/uL (ref 4.0–10.5)
nRBC: 0 % (ref 0.0–0.2)

## 2023-05-04 LAB — TROPONIN I (HIGH SENSITIVITY)
Troponin I (High Sensitivity): 11 ng/L (ref <18)
Troponin I (High Sensitivity): 13 ng/L (ref <18)

## 2023-05-04 LAB — CBG MONITORING, ED: Glucose-Capillary: 107 mg/dL — ABNORMAL HIGH (ref 70–99)

## 2023-05-04 MED ORDER — IOHEXOL 350 MG/ML SOLN
75.0000 mL | Freq: Once | INTRAVENOUS | Status: AC | PRN
  Administered 2023-05-04: 75 mL via INTRAVENOUS

## 2023-05-04 MED ORDER — LORAZEPAM 2 MG/ML IJ SOLN
1.0000 mg | Freq: Once | INTRAMUSCULAR | Status: AC
  Administered 2023-05-04: 1 mg via INTRAVENOUS
  Filled 2023-05-04: qty 1

## 2023-05-04 MED ORDER — ACETAMINOPHEN 500 MG PO TABS
1000.0000 mg | ORAL_TABLET | Freq: Once | ORAL | Status: AC
Start: 2023-05-04 — End: 2023-05-04
  Administered 2023-05-04: 1000 mg via ORAL
  Filled 2023-05-04: qty 2

## 2023-05-04 NOTE — ED Provider Notes (Signed)
[facility] Provider Note    Event Date/Time   First MD Initiated Contact with Patient 05/04/23 [phone]     (approximate)   History   Loss of Consciousness   HPI  Jon [name] is a 72 y.o. male with history of hypertension, diabetes, AVM, CVA who presents to the emergency department after near syncopal event.  Patient was the visitor of another patient here and was being questioned by law enforcement when he began to hyperventilate and stepped out of the room and then collapsed to the floor.  He was caught by Patent examiner before hitting his head or falling all the way down to the ground.  His wife is at the bedside stating that this has happened to him before with panic attacks.  He is unable to answer any questions at this time but is awake.  He is hyperventilating.  Blood sugar normal.  No seizure-like activity.   History provided by patient, wife, Patent examiner.    Past Medical History:  Diagnosis Date   AVM (arteriovenous malformation) brain    Diabetes mellitus without complication (HCC)    Hypertension    Pituitary tumor    TIA (transient ischemic attack)     Past Surgical History:  Procedure Laterality Date   COLONOSCOPY     EYE SURGERY     HERNIA REPAIR      MEDICATIONS:  Prior to Admission medications   Medication Sig Start Date End Date Taking? Authorizing Provider  acetaminophen (TYLENOL) 500 MG tablet Take 2 tablets (1,000 mg total) by mouth every 6 (six) hours as needed. 06/08/22 06/08/23  Varney Daily, PA  albuterol (VENTOLIN HFA) 108 (90 Base) MCG/ACT inhaler Inhale 2 puffs into the lungs every 6 (six) hours as needed for wheezing or shortness of breath. 02/22/22   Pilar Jarvis, MD  amLODipine (NORVASC) 5 MG tablet Take 5 mg by mouth daily.    [provider]  aspirin EC 81 MG tablet Take 1 tablet (81 mg total) by mouth daily. Swallow whole. 11/21/22   Enedina Finner, MD  atorvastatin (LIPITOR) 80 MG tablet Take 1  tablet (80 mg total) by mouth daily. 11/21/22   Enedina Finner, MD  butalbital-acetaminophen-caffeine (FIORICET) [phone] MG tablet Take 2 tablets by mouth every 6 (six) hours as needed. 11/24/22   [provider]  clonazePAM (KLONOPIN) 0.5 MG tablet Take 0.5 mg by mouth 2 (two) times daily as needed. 08/17/22   [provider]  DULoxetine (CYMBALTA) 30 MG capsule Take 30 mg by mouth daily. 06/30/22   [provider]  EPINEPHrine 0.3 mg/0.3 mL IJ SOAJ injection Inject 0.3 mg into the muscle as needed for anaphylaxis. 02/22/22   Pilar Jarvis, MD  ezetimibe (ZETIA) 10 MG tablet Take 10 mg by mouth daily.    [provider]  fluticasone-salmeterol (ADVAIR) 250-50 MCG/ACT AEPB Inhale 1 puff into the lungs in the morning and at bedtime. 06/30/22 06/30/23  [provider]  glipiZIDE (GLUCOTROL XL) 10 MG 24 hr tablet Take 10 mg by mouth daily.    [provider]  pantoprazole (PROTONIX) 40 MG tablet Take 40 mg by mouth daily. 08/11/22   [provider]  testosterone cypionate (DEPOTESTOSTERONE CYPIONATE) 200 MG/ML injection Inject 200 mg into the muscle every 28 (twenty-eight) days. 12/08/22   [provider]  traZODone (DESYREL) 100 MG tablet Take 100 mg by mouth at bedtime. 10/29/22   [provider]    Physical Exam   Triage  Vital Signs: ED Triage Vitals  Encounter Vitals Group     BP 05/04/23 0200 (!) 175/99     Systolic BP Percentile --      Diastolic BP Percentile --      Pulse Rate 05/04/23 0200 76     Resp 05/04/23 0200 (!) 22     Temp 05/04/23 0202 98.9 F (37.2 C)     Temp Source 05/04/23 0202 Oral     SpO2 05/04/23 0200 100 %     Weight 05/04/23 0206 180 lb 12.4 oz (82 kg)     Height 05/04/23 0206 5\' 6"  (1.676 m)     Head Circumference --      Peak Flow --      Pain Score --      Pain Loc --      Pain Education --      Exclude from Growth Chart --     Most recent vital signs: Vitals:   05/04/23 0415 05/04/23  0630  BP: (!) 130/98 110/86  Pulse: 70 74  Resp: 11   Temp:    SpO2: 98% 97%    CONSTITUTIONAL: Alert, hyperventilating, does not answer all questions but will open eyes and follow some commands and answer "yes" HEAD: Normocephalic, atraumatic EYES: Conjunctivae clear, pupils appear equal, sclera nonicteric ENT: normal nose; moist mucous membranes NECK: Supple, normal ROM CARD: Regular and tachycardic; S1 and S2 appreciated RESP: Patient is tachypneic breath sounds clear and equal bilaterally; no wheezes, no rhonchi, no rales, no hypoxia or respiratory distress, speaking full sentences ABD/GI: Non-distended; soft, non-tender, no rebound, no guarding, no peritoneal signs BACK: The back appears normal EXT: Normal ROM in all joints; no deformity noted, no edema SKIN: Normal color for age and race; warm; no rash on exposed skin NEURO: Moves all extremities equally, normal speech, no facial asymmetry PSYCH: The patient's mood and manner are appropriate.   ED Results / Procedures / Treatments   LABS: (all labs ordered are listed, but only abnormal results are displayed) Labs Reviewed  COMPREHENSIVE METABOLIC PANEL - Abnormal; Notable for the following components:      Result Value   Glucose, Bld 112 (*)    All other components within normal limits  URINALYSIS, ROUTINE W REFLEX MICROSCOPIC - Abnormal; Notable for the following components:   Color, Urine YELLOW (*)    APPearance CLEAR (*)    Specific Gravity, Urine 1.045 (*)    Ketones, ur 5 (*)    All other components within normal limits  CBC WITH DIFFERENTIAL/PLATELET  TROPONIN I (HIGH SENSITIVITY)  TROPONIN I (HIGH SENSITIVITY)     EKG:  EKG Interpretation Date/Time:  Wednesday May 04 2023 01:52:22 EST Ventricular Rate:  80 PR Interval:  167 QRS Duration:  80 QT Interval:  396 QTC Calculation: 457 R Axis:   42  Text Interpretation: Sinus rhythm ST elevation, consider inferior injury Baseline wander No  significant change since last tracing Confirmed by Rochele Raring [phone]) on 05/04/2023 3:49:49 AM         RADIOLOGY: My personal review and interpretation of imaging: CTA head and neck shows stable AVM and subclavian dissection and pseudoaneurysm.  MRI shows no stroke.  I have personally reviewed all radiology reports.   CT ANGIO HEAD NECK W WO CM Result Date: 05/04/2023 CLINICAL DATA:  72 year old male with altered mental status. Loss of consciousness and neurologic deficit. EXAM: CT ANGIOGRAPHY HEAD AND NECK WITH AND WITHOUT CONTRAST TECHNIQUE: Multidetector CT imaging of the head and  neck was performed using the standard protocol during bolus administration of intravenous contrast. Multiplanar CT image reconstructions and MIPs were obtained to evaluate the vascular anatomy. Carotid stenosis measurements (when applicable) are obtained utilizing NASCET criteria, using the distal internal carotid diameter as the denominator. RADIATION DOSE REDUCTION: This exam was performed according to the departmental dose-optimization program which includes automated exposure control, adjustment of the mA and/or kV according to patient size and/or use of iterative reconstruction technique. CONTRAST:  75mL OMNIPAQUE IOHEXOL 350 MG/ML SOLN COMPARISON:  Brain MRI and head CT today. Most recent CTA head and neck 12/10/2022. FINDINGS: CTA NECK Skeleton: No acute osseous abnormality identified. Cervical and thoracic spine Diffuse idiopathic skeletal hyperostosis (DISH). Upper chest: Mild atelectasis.  Negative visible mediastinum. Other neck: No acute finding. Aortic arch: 3 vessel arch. Mild arch tortuosity and atherosclerosis. Right carotid system: Mild brachiocephalic artery and right CCA origin tortuosity, mild plaque without stenosis. Patent right carotid bifurcation. Mild right ICA bulb plaque without stenosis. Left carotid system: Negative left CCA origin. Mild left CCA tortuosity. Mild plaque at the left carotid  bifurcation, left ICA bulb. No stenosis. Vertebral arteries: Mild plaque at the right subclavian artery origin without stenosis. Normal right vertebral artery origin. Right V1 segment soft and calcified atherosclerosis with only mild stenosis. Right vertebral artery remains patent, stable to the skull base. Proximal left subclavian artery mostly soft atherosclerotic plaque and tortuosity. Furthermore, chronic appearing left subclavian artery dissection and/or pseudoaneurysm (series 8, image 135) over a segment of about 2.4 cm of the subclavian at the level of the left lung apex, thoracic inlet. Pseudoaneurysm there is about 2 cm in length, 10 mm AP and has not significantly changed since 2019. See also series 6, image 292. Despite these findings the left vertebral artery origin remains patent without origin stenosis. The left vertebral is fairly codominant and patent to the skull base with no significant plaque or stenosis. CTA HEAD Posterior circulation: Patent distal vertebral arteries and vertebrobasilar junction with no significant plaque or stenosis. The right V4 segment is dominant as before. Normal PICA origins. Patent basilar artery without stenosis. Patent SCA and PCA origins. Posterior communicating arteries are diminutive or absent. Bilateral PCA branches are within normal limits. Anterior circulation: Both ICA siphons are patent. Both siphons are mildly tortuous, ectatic. Mild bilateral supraclinoid ICA calcified plaque without stenosis. Normal ophthalmic artery origins. Patent carotid termini. Patent MCA and ACA origins. Anterior communicating artery is within normal limits. Bilateral ACA branches are stable and within normal limits. Right MCA M1 segment and bifurcation are patent without stenosis. Right MCA branches are stable since August, within normal limits. Left MCA M1 segment is mildly ectatic, patent to the left MCA bifurcation without stenosis. Left MCA branches are patent without stenosis, and  posterior left MCA division appears mildly ectatic and is associated with the chronic left temporal lobe vascular malformation demonstrated on series 11, image 29. Abnormal contrast blush there in an area of about 2 cm and prominent draining left temporal vein, drainages primarily superficial and extends superiorly to the anterior superior sagittal sinus. This finding is stable. Venous sinuses: Early contrast timing. Early enhancement of the superior sagittal sinus related to the left temporal lobe AVM. Anatomic variants: Mildly dominant right vertebral V4 segment. Review of the MIP images confirms the above findings IMPRESSION: Negative for large vessel occlusion. 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 - scattered atherosclerosis in the head and neck with no hemodynamically significant stenosis identified. Electronically Signed   By: Odessa Fleming M.D.   On: 05/04/2023 04:38   MR BRAIN WO CONTRAST Result Date: 05/04/2023 CLINICAL DATA:  72 year old male with altered mental status. Loss of consciousness and neurologic deficit. EXAM: MRI HEAD WITHOUT CONTRAST TECHNIQUE: Multiplanar, multiecho pulse sequences of the brain and surrounding structures were obtained without intravenous contrast. COMPARISON:  Head CT without contrast 0218 hours today. Brain MRI 11/18/2022. FINDINGS: Brain: No restricted diffusion to suggest acute infarction. No midline shift, mass effect, evidence of mass lesion, ventriculomegaly, extra-axial collection or acute intracranial hemorrhage. Cervicomedullary junction is within normal limits. Patchy chronic infarcts with encephalomalacia in the left parietal lobe, right occipital lobe, left cerebellar hemisphere are stable since  July. Widely scattered additional chronic small vessel ischemic changes including in both superior frontal gyri, right centrum semiovale (progressed since July), bilateral periatrial white matter. 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 The posterior fossa is relatively spared. SWI appearance not significantly changed from July. Signal in the deep gray nuclei and brainstem remains normal for age. 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 This follows ACR consensus guidelines: Management of Incidental Pituitary Findings on CT, MRI and F18-FDG PET: A White Paper of the ACR Incidental Findings Committee. J Am Coll Radiol 2018; 15: 782-95). Suprasellar cistern and noncontrast cavernous sinuses remain within normal limits. Vascular: Major intracranial vascular flow voids are stable since July. Abnormal tangle of vessels, vascular flow voids redemonstrated along the surface of the posterior left temporal lobe, series 11, images 13 and 14 and series 5, image 23. Chronic left temporal lobe AVM or AV fistula has been documented there since at least 2019, currently than itis is approximately 2 cm. No associated cerebral edema there. Skull and upper cervical spine: Negative for age visible cervical spine. Visualized bone marrow signal is within normal limits. Sinuses/Orbits: Stable, negative. Other: Visible internal auditory structures appear normal. Mastoids are clear. Negative visible scalp and face. IMPRESSION: 1. No acute intracranial abnormality. 2. 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 Electronically Signed   By: Odessa Fleming M.D.   On: 05/04/2023 04:26   CT HEAD WO CONTRAST  ( ) Result Date: 05/04/2023 CLINICAL DATA:  Mental status change, unknown cause EXAM: CT HEAD WITHOUT CONTRAST TECHNIQUE: Contiguous axial images were obtained from the base of the skull through the vertex without intravenous contrast. RADIATION DOSE REDUCTION: This exam was performed according to the departmental dose-optimization program which includes automated exposure control, adjustment of the mA and/or kV according to patient size and/or use of iterative reconstruction technique. COMPARISON:  12/10/2022 CT head FINDINGS: Brain: No evidence of acute infarction, hemorrhage, mass, mass effect, or midline shift. No hydrocephalus or extra-axial fluid collection. Possible hyperdense extra-axial focus along the left temporal lobe is favored to be artifactual, related to the patient's calvarium. Redemonstrated encephalomalacia in the left parieto-occipital region and right occipital lobe. Periventricular white matter changes, likely the sequela of chronic small vessel ischemic disease. Vascular: No hyperdense vessel. Skull: Negative for fracture or focal lesion. Sinuses/Orbits: No acute finding. Status post  bilateral lens replacements. Other: The mastoid air cells are well aerated. IMPRESSION: 1. Possible hyperdense extra-axial focus along the left temporal lobe is favored to be artifactual, related to the patient's calvarium. If there is clinical concern for acute hemorrhage, consider further evaluation with MRI. 2. Otherwise, no acute intracranial process. Electronically Signed   By: Wiliam Ke M.D.   On: 05/04/2023 02:22     PROCEDURES:  Critical Care performed: No     .1-3 Lead EKG Interpretation  Performed by: Nyra Anspaugh, Layla Maw, DO Authorized by: Grisel Blumenstock, Layla Maw, DO     Interpretation: normal     ECG rate:  79   ECG rate assessment: normal     Rhythm: sinus rhythm     Ectopy: none     Conduction: normal       IMPRESSION / MDM / ASSESSMENT AND PLAN / ED COURSE  I reviewed the triage  vital signs and the nursing notes.    Patient here with episode of near syncope.  The patient is on the cardiac monitor to evaluate for evidence of arrhythmia and/or significant heart rate changes.   DIFFERENTIAL DIAGNOSIS (includes but not limited to):   Suspect panic attack.  Differential also includes intracranial hemorrhage, stroke, UTI, anemia, electrolyte derangement, arrhythmia, ACS, PE   Patient's presentation is most consistent with acute presentation with potential threat to life or bodily function.   PLAN: Blood sugar normal.  EKG nonischemic.  Will give Ativan for anxiety.  No seizure-like activity.  Patient unable to answer many questions at this time.  Wife reports this has happened to him before with anxiety and this occurred after being questioned by law enforcement.  No head injury here.  No traumatic injury on exam.  Police report that they were able to ease him to the ground.   MEDICATIONS GIVEN IN ED: Medications  LORazepam (ATIVAN) injection 1 mg (1 mg Intravenous Given 05/04/23 0204)  acetaminophen (TYLENOL) tablet 1,000 mg (1,000 mg Oral Given 05/04/23 0416)  iohexol (OMNIPAQUE) 350 MG/ML injection 75 mL (75 mLs Intravenous Contrast Given 05/04/23 0409)     ED COURSE: Labs show normal hemoglobin, normal electrolytes.  First troponin negative.  Second pending.  CT of the head obtained reviewed and interpreted by myself and the radiologist and shows possible hyperdense focus in the left temporal lobe that could be artifact versus hemorrhage.  Patient is awake now, alert and oriented x 3.  He is complaining of headache.  Will obtain MRI of the brain as well as CTA head and neck given wife reports history of AVM.  Will give Tylenol for discomfort.  He is not on blood thinners.  Will continue to monitor.  He is otherwise neurologically intact with NIH stroke scale of 0.   6:16 AM  Pt's second troponin negative.  No arrhythmia seen on cardiac monitoring.  CTA head and  neck and MRI brain reviewed and interpreted by myself and the radiologist and show no acute abnormality.  Stable chronic left temporal lobe AVM and left subclavian artery dissection and pseudoaneurysm.  Will p.o. challenge, obtain urinalysis but anticipate discharge home.  Suspect symptoms secondary to anxiety.  Patient and wife agree.   7:20 AM  Urine shows no sign of infection.  I feel he is safe for discharge home.  Remains hemodynamically stable without acute complaints.   At this time, I do not feel there is any life-threatening condition present. I reviewed all nursing notes, vitals, pertinent previous records.  All lab and  urine results, EKGs, imaging ordered have been independently reviewed and interpreted by myself.  I reviewed all available radiology reports from any imaging ordered this visit.  Based on my assessment, I feel the patient is safe to be discharged home without further emergent workup and can continue workup as an outpatient as needed. Discussed all findings, treatment plan as well as usual and customary return precautions.  They verbalize understanding and are comfortable with this plan.  Outpatient follow-up has been provided as needed.  All questions have been answered.   CONSULTS:  none   OUTSIDE RECORDS REVIEWED: Reviewed last internal medicine note on 03/23/2023.       FINAL CLINICAL IMPRESSION(S) / ED DIAGNOSES   Final diagnoses:  Near syncope  Panic attack     Rx / DC Orders   ED Discharge Orders     None        Note:  This document was prepared using Dragon voice recognition software and may include unintentional dictation errors.   Keyarra Rendall, Layla Maw, DO 05/04/23 [phone]

## 2023-05-04 NOTE — ED Notes (Signed)
Patient transported to CT with nurse

## 2023-05-04 NOTE — ED Triage Notes (Addendum)
Pt visitor in department when staff observed pt walking out of room 26 and leaned against wall with shoulder and slid down onto floor. Pt with rigid body on floor and breathing shallow. Pts eyes open but not responding to verbal stimuli but responsive to painful stimuli. Pt assisted to sitting position by staff, oxygen applied and vital signs obtained. Pt shaking and diaphoretic. CBG obtained with normal reading. Pt assisted onto ED stretcher and taken to room 2 for further evaluation and registration.   Per witnesses's, pt did not hit his head when fall occurred.

## 2023-05-04 NOTE — ED Notes (Signed)
Patient in MRI 

## 2023-05-04 NOTE — ED Notes (Signed)
PO liquids and medications well tolerated.

## 2023-06-21 DIAGNOSIS — G720 Drug-induced myopathy: Secondary | ICD-10-CM | POA: Diagnosis not present

## 2023-06-21 DIAGNOSIS — F332 Major depressive disorder, recurrent severe without psychotic features: Secondary | ICD-10-CM | POA: Diagnosis not present

## 2023-06-21 DIAGNOSIS — I10 Essential (primary) hypertension: Secondary | ICD-10-CM | POA: Diagnosis not present

## 2023-06-21 DIAGNOSIS — E782 Mixed hyperlipidemia: Secondary | ICD-10-CM | POA: Diagnosis not present

## 2023-06-21 DIAGNOSIS — T466X5A Adverse effect of antihyperlipidemic and antiarteriosclerotic drugs, initial encounter: Secondary | ICD-10-CM | POA: Diagnosis not present
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
## 2023-06-26 DIAGNOSIS — Z03818 Encounter for observation for suspected exposure to other biological agents ruled out: Secondary | ICD-10-CM | POA: Diagnosis not present

## 2023-06-26 DIAGNOSIS — J101 Influenza due to other identified influenza virus with other respiratory manifestations: Secondary | ICD-10-CM | POA: Diagnosis not present
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
## 2023-08-05 ENCOUNTER — Other Ambulatory Visit: Payer: Self-pay

## 2023-08-05 ENCOUNTER — Emergency Department

## 2023-08-05 ENCOUNTER — Emergency Department
Admission: EM | Admit: 2023-08-05 | Discharge: 2023-08-05 | Disposition: A | Discharge status: Home / Self Care | Attending: Emergency Medicine | Admitting: Emergency Medicine

## 2023-08-05 DIAGNOSIS — I10 Essential (primary) hypertension: Secondary | ICD-10-CM | POA: Diagnosis not present

## 2023-08-05 DIAGNOSIS — Z7982 Long term (current) use of aspirin: Secondary | ICD-10-CM | POA: Diagnosis not present

## 2023-08-05 DIAGNOSIS — R531 Weakness: Secondary | ICD-10-CM | POA: Diagnosis not present

## 2023-08-05 DIAGNOSIS — Z5329 Procedure and treatment not carried out because of patient's decision for other reasons: Secondary | ICD-10-CM | POA: Diagnosis not present

## 2023-08-05 DIAGNOSIS — R55 Syncope and collapse: Secondary | ICD-10-CM | POA: Diagnosis not present

## 2023-08-05 DIAGNOSIS — F410 Panic disorder [episodic paroxysmal anxiety] without agoraphobia: Secondary | ICD-10-CM | POA: Diagnosis not present

## 2023-08-05 DIAGNOSIS — R0902 Hypoxemia: Secondary | ICD-10-CM | POA: Diagnosis not present

## 2023-08-05 DIAGNOSIS — F488 Other specified nonpsychotic mental disorders: Secondary | ICD-10-CM | POA: Insufficient documentation

## 2023-08-05 DIAGNOSIS — W19XXXA Unspecified fall, initial encounter: Secondary | ICD-10-CM | POA: Diagnosis not present

## 2023-08-05 LAB — CBC
HCT: 40.5 % (ref 39.0–52.0)
Hemoglobin: 14 g/dL (ref 13.0–17.0)
MCH: 31.5 pg (ref 26.0–34.0)
MCHC: 34.6 g/dL (ref 30.0–36.0)
MCV: 91 fL (ref 80.0–100.0)
Platelets: 155 10*3/uL (ref 150–400)
RBC: 4.45 MIL/uL (ref 4.22–5.81)
RDW: 12.8 % (ref 11.5–15.5)
WBC: 4.3 10*3/uL (ref 4.0–10.5)
nRBC: 0 % (ref 0.0–0.2)

## 2023-08-05 LAB — BASIC METABOLIC PANEL WITH GFR
Anion gap: 14 (ref 5–15)
BUN: 16 mg/dL (ref 8–23)
CO2: 24 mmol/L (ref 22–32)
Calcium: 9.3 mg/dL (ref 8.9–10.3)
Chloride: 99 mmol/L (ref 98–111)
Creatinine, Ser: 0.79 mg/dL (ref 0.61–1.24)
GFR, Estimated: >60 mL/min (ref >60)
Glucose, Bld: 182 mg/dL — ABNORMAL HIGH (ref 70–99)
Potassium: 4.1 mmol/L (ref 3.5–5.1)
Sodium: 137 mmol/L (ref 135–145)

## 2023-08-05 LAB — URINALYSIS, ROUTINE W REFLEX MICROSCOPIC
Bilirubin Urine: NEGATIVE
Glucose, UA: 50 mg/dL — AB
Hgb urine dipstick: NEGATIVE
Ketones, ur: NEGATIVE mg/dL
Leukocytes,Ua: NEGATIVE
Nitrite: NEGATIVE
Protein, ur: NEGATIVE mg/dL
Specific Gravity, Urine: 1.01 (ref 1.005–1.030)
pH: 7 (ref 5.0–8.0)

## 2023-08-05 LAB — TROPONIN I (HIGH SENSITIVITY)
Troponin I (High Sensitivity): 8 ng/L (ref <18)
Troponin I (High Sensitivity): 11 ng/L (ref <18)

## 2023-08-05 MED ORDER — SODIUM CHLORIDE 0.9 % IV BOLUS
500.0000 mL | Freq: Once | INTRAVENOUS | Status: AC
  Administered 2023-08-05: 500 mL via INTRAVENOUS

## 2023-08-05 MED ORDER — LORAZEPAM 2 MG/ML IJ SOLN
1.0000 mg | Freq: Once | INTRAMUSCULAR | Status: AC
  Administered 2023-08-05: 1 mg via INTRAVENOUS
  Filled 2023-08-05: qty 1

## 2023-08-05 NOTE — ED Provider Notes (Signed)
 [place] EMERGENCY DEPARTMENT AT [facility] Provider Note   CSN: [id number] Arrival date & time: 08/05/23  1326     History  Chief Complaint  Patient presents with   Loss of Consciousness    Jon [name] is a 73 y.o. male.  Patient with history of panic disorder here for loss of consciousness.  He had a syncopal episode earlier today when he was at home on the porch.  Got very upset that the insurance company just started working on the house and was not doing it right.  He sat down and then passed out did not hit head.  This is consistent with prior anxiety episodes.  By standards do note that he did not hit his head but recommended he come to the ER otherwise he would not have come.  Wife states that this is normal for him and he is currently at baseline.  Denies any specific numbness or weakness but does feel generally weak with difficulty standing again this is a baseline secondary to previous CVA.   Loss of Consciousness Associated symptoms: no chest pain, no dizziness, no fever, no headaches, no nausea, no seizures, no shortness of breath, no vomiting and no weakness        Home Medications Prior to Admission medications   Medication Sig Start Date End Date Taking? Authorizing Provider  albuterol (VENTOLIN HFA) 108 (90 Base) MCG/ACT inhaler Inhale 2 puffs into the lungs every 6 (six) hours as needed for wheezing or shortness of breath. 02/22/22   Pilar Jarvis, MD  amLODipine (NORVASC) 5 MG tablet Take 5 mg by mouth daily.    [provider]  aspirin EC 81 MG tablet Take 1 tablet (81 mg total) by mouth daily. Swallow whole. 11/21/22   Enedina Finner, MD  atorvastatin (LIPITOR) 80 MG tablet Take 1 tablet (80 mg total) by mouth daily. 11/21/22   Enedina Finner, MD  butalbital-acetaminophen-caffeine (FIORICET) [phone] MG tablet Take 2 tablets by mouth every 6 (six) hours as needed. 11/24/22   [provider]  clonazePAM (KLONOPIN) 0.5 MG tablet Take 0.5 mg  by mouth 2 (two) times daily as needed. 08/17/22   [provider]  DULoxetine (CYMBALTA) 30 MG capsule Take 30 mg by mouth daily. 06/30/22   [provider]  EPINEPHrine 0.3 mg/0.3 mL IJ SOAJ injection Inject 0.3 mg into the muscle as needed for anaphylaxis. 02/22/22   Pilar Jarvis, MD  ezetimibe (ZETIA) 10 MG tablet Take 10 mg by mouth daily.    [provider]  fluticasone-salmeterol (ADVAIR) 250-50 MCG/ACT AEPB Inhale 1 puff into the lungs in the morning and at bedtime. 06/30/22 06/30/23  [provider]  glipiZIDE (GLUCOTROL XL) 10 MG 24 hr tablet Take 10 mg by mouth daily.    [provider]  pantoprazole (PROTONIX) 40 MG tablet Take 40 mg by mouth daily. 08/11/22   [provider]  testosterone cypionate (DEPOTESTOSTERONE CYPIONATE) 200 MG/ML injection Inject 200 mg into the muscle every 28 (twenty-eight) days. 12/08/22   [provider]  traZODone (DESYREL) 100 MG tablet Take 100 mg by mouth at bedtime. 10/29/22   [provider]      Allergies    Beta adrenergic blockers, Topiramate, Wasp venom, Aspirin, Benadryl [diphenhydramine], Lisinopril, Nabumetone, and Valproic acid    Review of Systems   Review of Systems  Constitutional:  Positive for fatigue. Negative for chills and fever.  Respiratory:  Negative for shortness of breath.   Cardiovascular:  Positive for  syncope. Negative for chest pain.  Gastrointestinal:  Negative for abdominal pain, nausea and vomiting.  Genitourinary:  Negative for dysuria.  Musculoskeletal:  Negative for back pain.  Skin:  Negative for wound.  Neurological:  Positive for syncope. Negative for dizziness, seizures, weakness, numbness and headaches.    Physical Exam Updated Vital Signs BP (!) 159/97   Pulse (!) 111   Temp 98 F (36.7 C) (Oral)   Resp 20   SpO2 98%  Physical Exam Vitals reviewed.  Constitutional:      Appearance: Normal appearance.  HENT:     Head: Normocephalic and  atraumatic.     Nose: Nose normal.  Cardiovascular:     Pulses: Normal pulses.  Pulmonary:     Effort: Pulmonary effort is normal.  Chest:     Chest wall: No tenderness.  Abdominal:     Tenderness: There is no abdominal tenderness.  Musculoskeletal:     Cervical back: Normal range of motion.  Neurological:     General: No focal deficit present.     Mental Status: He is alert and oriented to person, place, and time. Mental status is at baseline.     Comments: No focal numbness or weakness.  Unsteady gait when standing.  This is baseline  Psychiatric:        Mood and Affect: Mood normal.        Behavior: Behavior normal.     ED Results / Procedures / Treatments   Labs (all labs ordered are listed, but only abnormal results are displayed) Labs Reviewed  BASIC METABOLIC PANEL WITH GFR - Abnormal; Notable for the following components:      Result Value   Glucose, Bld 182 (*)    All other components within normal limits  URINALYSIS, ROUTINE W REFLEX MICROSCOPIC - Abnormal; Notable for the following components:   Color, Urine YELLOW (*)    APPearance CLEAR (*)    Glucose, UA 50 (*)    All other components within normal limits  CBC  CBG MONITORING, ED  TROPONIN I (HIGH SENSITIVITY)  TROPONIN I (HIGH SENSITIVITY)    EKG None  Radiology DG Chest 1 View Result Date: 08/05/2023 CLINICAL DATA:  Syncope. EXAM: CHEST  1 VIEW COMPARISON:  Chest radiograph dated 12/10/2022. FINDINGS: The heart size and mediastinal contours are within normal limits. No focal consolidation, sizeable pleural effusion, or pneumothorax. Remote healed right rib fractures. No acute osseous abnormality. IMPRESSION: No acute cardiopulmonary findings. Electronically Signed   By: Hart Robinsons M.D.   On: 08/05/2023 16:46    Procedures Procedures    Medications Ordered in ED Medications  sodium chloride 0.9 % bolus 500 mL (0 mLs Intravenous Stopped 08/05/23 1620)  LORazepam (ATIVAN) injection 1 mg (1 mg  Intravenous Given 08/05/23 1511)    ED Course/ Medical Decision Making/ A&P                                 Medical Decision Making Patient here for syncopal episode consistent with prior anxiety episodes.  He normally would not have come to the ER but by standards called the ambulance.  Upon initial evaluation called in by CT as patient was declining head CT.  I did discuss my concerns after evaluating him as I am concerned for ICH he could have had a head injury less likely mass as he did have a recent negative CT.  Patient declines due to concerns of  cost as he knows this is caused by his anxiety I think this is reasonable.  Will allow him to refuse care.  Troponins are negative chest x-ray is clear.  CBC and CMP overall unremarkable negative urine.  Patient is feeling well at baseline now.  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  Amount and/or Complexity of Data Reviewed Labs: ordered. Radiology: ordered.  Risk Prescription drug management.           Final Clinical Impression(s) / ED Diagnoses Final diagnoses:  Panic attack  Psychogenic syncope  Care refused by patient    Rx / DC Orders ED Discharge Orders     None         Christen Bame, Cordelia Poche 08/05/23 1749    Sharman Cheek, MD 08/05/23 2128

## 2023-08-05 NOTE — ED Triage Notes (Addendum)
 Pt to ED via [facility] from home. Wife reports pt got very upset about workers bringing the wrong lumbar for [employer] representative. Pt went to sit down on porch and had a syncopal episode. Pt reports feels like he is having a panic attack and took his PRN medication. Pt with hx of anxiety and CVA. Pt has prior left sided deficits from prior stroke. Pt denies pain but states weakness all over.   Wife reports pt is at baseline

## 2023-08-05 NOTE — ED Notes (Signed)
 Patient was asked again if they wanted the CT and the patient is still refusing.

## 2023-08-05 NOTE — Discharge Instructions (Signed)
 Please follow-up with your primary care for further evaluation.  Return here for new or worse symptoms.

## 2023-08-05 NOTE — ED Notes (Signed)
 See triage notes. Patient stated he had a panic attack at home causing him to lose consciousness. Patient c/o his head hurting.

## 2023-12-07 ENCOUNTER — Other Ambulatory Visit: Payer: Self-pay

## 2023-12-07 ENCOUNTER — Other Ambulatory Visit: Payer: Self-pay | Admitting: Internal Medicine

## 2023-12-07 ENCOUNTER — Inpatient Hospital Stay (HOSPITAL_COMMUNITY)
Admission: EM | Admit: 2023-12-07 | Discharge: 2023-12-09 | DRG: 100 | Disposition: A | Discharge status: Home / Self Care | Source: Other Acute Inpatient Hospital | Attending: Internal Medicine | Admitting: Internal Medicine

## 2023-12-07 ENCOUNTER — Emergency Department
Admission: EM | Admit: 2023-12-07 | Discharge: 2023-12-07 | Disposition: A | Discharge status: Other Acute Inpatient Hospital | Attending: Emergency Medicine | Admitting: Emergency Medicine

## 2023-12-07 ENCOUNTER — Emergency Department

## 2023-12-07 ENCOUNTER — Inpatient Hospital Stay (HOSPITAL_COMMUNITY)

## 2023-12-07 ENCOUNTER — Encounter (HOSPITAL_COMMUNITY): Payer: Self-pay

## 2023-12-07 DIAGNOSIS — Z833 Family history of diabetes mellitus: Secondary | ICD-10-CM

## 2023-12-07 DIAGNOSIS — R208 Other disturbances of skin sensation: Secondary | ICD-10-CM

## 2023-12-07 DIAGNOSIS — I6782 Cerebral ischemia: Secondary | ICD-10-CM | POA: Diagnosis not present

## 2023-12-07 DIAGNOSIS — Z7982 Long term (current) use of aspirin: Secondary | ICD-10-CM

## 2023-12-07 DIAGNOSIS — G894 Chronic pain syndrome: Secondary | ICD-10-CM | POA: Diagnosis present

## 2023-12-07 DIAGNOSIS — R4182 Altered mental status, unspecified: Secondary | ICD-10-CM | POA: Diagnosis not present

## 2023-12-07 DIAGNOSIS — R209 Unspecified disturbances of skin sensation: Secondary | ICD-10-CM | POA: Insufficient documentation

## 2023-12-07 DIAGNOSIS — R42 Dizziness and giddiness: Secondary | ICD-10-CM | POA: Diagnosis not present

## 2023-12-07 DIAGNOSIS — Q282 Arteriovenous malformation of cerebral vessels: Secondary | ICD-10-CM

## 2023-12-07 DIAGNOSIS — R200 Anesthesia of skin: Secondary | ICD-10-CM | POA: Diagnosis present

## 2023-12-07 DIAGNOSIS — R531 Weakness: Secondary | ICD-10-CM | POA: Insufficient documentation

## 2023-12-07 DIAGNOSIS — Z781 Physical restraint status: Secondary | ICD-10-CM | POA: Diagnosis not present

## 2023-12-07 DIAGNOSIS — F32A Depression, unspecified: Secondary | ICD-10-CM | POA: Diagnosis not present

## 2023-12-07 DIAGNOSIS — Z8673 Personal history of transient ischemic attack (TIA), and cerebral infarction without residual deficits: Secondary | ICD-10-CM | POA: Diagnosis not present

## 2023-12-07 DIAGNOSIS — Z91038 Other insect allergy status: Secondary | ICD-10-CM

## 2023-12-07 DIAGNOSIS — G4733 Obstructive sleep apnea (adult) (pediatric): Secondary | ICD-10-CM | POA: Diagnosis present

## 2023-12-07 DIAGNOSIS — I10 Essential (primary) hypertension: Secondary | ICD-10-CM | POA: Insufficient documentation

## 2023-12-07 DIAGNOSIS — I69354 Hemiplegia and hemiparesis following cerebral infarction affecting left non-dominant side: Secondary | ICD-10-CM

## 2023-12-07 DIAGNOSIS — M50222 Other cervical disc displacement at C5-C6 level: Secondary | ICD-10-CM | POA: Diagnosis not present

## 2023-12-07 DIAGNOSIS — K219 Gastro-esophageal reflux disease without esophagitis: Secondary | ICD-10-CM | POA: Diagnosis present

## 2023-12-07 DIAGNOSIS — Z888 Allergy status to other drugs, medicaments and biological substances status: Secondary | ICD-10-CM | POA: Diagnosis not present

## 2023-12-07 DIAGNOSIS — R569 Unspecified convulsions: Secondary | ICD-10-CM | POA: Diagnosis not present

## 2023-12-07 DIAGNOSIS — Z7984 Long term (current) use of oral hypoglycemic drugs: Secondary | ICD-10-CM

## 2023-12-07 DIAGNOSIS — Z79899 Other long term (current) drug therapy: Secondary | ICD-10-CM

## 2023-12-07 DIAGNOSIS — E119 Type 2 diabetes mellitus without complications: Secondary | ICD-10-CM | POA: Diagnosis not present

## 2023-12-07 DIAGNOSIS — M50322 Other cervical disc degeneration at C5-C6 level: Secondary | ICD-10-CM | POA: Diagnosis not present

## 2023-12-07 DIAGNOSIS — R55 Syncope and collapse: Secondary | ICD-10-CM | POA: Insufficient documentation

## 2023-12-07 DIAGNOSIS — F419 Anxiety disorder, unspecified: Secondary | ICD-10-CM | POA: Diagnosis present

## 2023-12-07 DIAGNOSIS — E872 Acidosis, unspecified: Secondary | ICD-10-CM | POA: Diagnosis not present

## 2023-12-07 DIAGNOSIS — Z7951 Long term (current) use of inhaled steroids: Secondary | ICD-10-CM

## 2023-12-07 DIAGNOSIS — E860 Dehydration: Secondary | ICD-10-CM | POA: Diagnosis present

## 2023-12-07 DIAGNOSIS — E782 Mixed hyperlipidemia: Secondary | ICD-10-CM | POA: Diagnosis present

## 2023-12-07 DIAGNOSIS — G9389 Other specified disorders of brain: Secondary | ICD-10-CM | POA: Diagnosis not present

## 2023-12-07 DIAGNOSIS — M4802 Spinal stenosis, cervical region: Secondary | ICD-10-CM | POA: Diagnosis not present

## 2023-12-07 DIAGNOSIS — Z886 Allergy status to analgesic agent status: Secondary | ICD-10-CM

## 2023-12-07 DIAGNOSIS — R29818 Other symptoms and signs involving the nervous system: Secondary | ICD-10-CM | POA: Diagnosis not present

## 2023-12-07 DIAGNOSIS — I672 Cerebral atherosclerosis: Secondary | ICD-10-CM | POA: Diagnosis not present

## 2023-12-07 DIAGNOSIS — I6523 Occlusion and stenosis of bilateral carotid arteries: Secondary | ICD-10-CM | POA: Diagnosis not present

## 2023-12-07 DIAGNOSIS — R9082 White matter disease, unspecified: Secondary | ICD-10-CM | POA: Diagnosis not present

## 2023-12-07 LAB — URINALYSIS, W/ REFLEX TO CULTURE (INFECTION SUSPECTED)
Bacteria, UA: NONE SEEN
Bilirubin Urine: NEGATIVE
Glucose, UA: 150 mg/dL — AB
Hgb urine dipstick: NEGATIVE
Ketones, ur: NEGATIVE mg/dL
Leukocytes,Ua: NEGATIVE
Nitrite: NEGATIVE
Protein, ur: NEGATIVE mg/dL
Specific Gravity, Urine: 1.023 (ref 1.005–1.030)
Squamous Epithelial / HPF: 0 /HPF (ref 0–5)
pH: 7 (ref 5.0–8.0)

## 2023-12-07 LAB — COMPREHENSIVE METABOLIC PANEL WITH GFR
ALT: 23 U/L (ref 0–44)
AST: 22 U/L (ref 15–41)
Albumin: 4.1 g/dL (ref 3.5–5.0)
Alkaline Phosphatase: 74 U/L (ref 38–126)
Anion gap: 11 (ref 5–15)
BUN: 16 mg/dL (ref 8–23)
CO2: 25 mmol/L (ref 22–32)
Calcium: 9.4 mg/dL (ref 8.9–10.3)
Chloride: 103 mmol/L (ref 98–111)
Creatinine, Ser: 0.87 mg/dL (ref 0.61–1.24)
GFR, Estimated: >60 mL/min (ref >60)
Glucose, Bld: 231 mg/dL — ABNORMAL HIGH (ref 70–99)
Potassium: 4.1 mmol/L (ref 3.5–5.1)
Sodium: 139 mmol/L (ref 135–145)
Total Bilirubin: 0.5 mg/dL (ref 0.0–1.2)
Total Protein: 6.9 g/dL (ref 6.5–8.1)

## 2023-12-07 LAB — GLUCOSE, CAPILLARY: Glucose-Capillary: 122 mg/dL — ABNORMAL HIGH (ref 70–99)

## 2023-12-07 LAB — CBC
HCT: 40.3 % (ref 39.0–52.0)
Hemoglobin: 14.1 g/dL (ref 13.0–17.0)
MCH: 31.8 pg (ref 26.0–34.0)
MCHC: 35 g/dL (ref 30.0–36.0)
MCV: 91 fL (ref 80.0–100.0)
Platelets: 167 K/uL (ref 150–400)
RBC: 4.43 MIL/uL (ref 4.22–5.81)
RDW: 12 % (ref 11.5–15.5)
WBC: 3.9 K/uL — ABNORMAL LOW (ref 4.0–10.5)
nRBC: 0 % (ref 0.0–0.2)

## 2023-12-07 LAB — CBG MONITORING, ED: Glucose-Capillary: 221 mg/dL — ABNORMAL HIGH (ref 70–99)

## 2023-12-07 LAB — LACTIC ACID, PLASMA
Lactic Acid, Venous: 2.9 mmol/L (ref 0.5–1.9)
Lactic Acid, Venous: 2.7 mmol/L (ref 0.5–1.9)

## 2023-12-07 LAB — TROPONIN I (HIGH SENSITIVITY)
Troponin I (High Sensitivity): 9 ng/L (ref <18)
Troponin I (High Sensitivity): 9 ng/L (ref <18)

## 2023-12-07 LAB — PROTIME-INR
INR: 0.9 (ref 0.8–1.2)
Prothrombin Time: 12.4 s (ref 11.4–15.2)

## 2023-12-07 LAB — CK: Total CK: 87 U/L (ref 49–397)

## 2023-12-07 MED ORDER — LORAZEPAM 2 MG/ML IJ SOLN
1.0000 mg | Freq: Once | INTRAMUSCULAR | Status: AC | PRN
  Administered 2023-12-08: 1 mg via INTRAVENOUS
  Filled 2023-12-07: qty 1

## 2023-12-07 MED ORDER — SODIUM CHLORIDE 0.9 % IV BOLUS
1000.0000 mL | Freq: Once | INTRAVENOUS | Status: AC
  Administered 2023-12-07: 1000 mL via INTRAVENOUS

## 2023-12-07 MED ORDER — ENOXAPARIN SODIUM 40 MG/0.4ML IJ SOSY
40.0000 mg | PREFILLED_SYRINGE | INTRAMUSCULAR | Status: DC
  Administered 2023-12-08 (×2): 40 mg via SUBCUTANEOUS
  Filled 2023-12-07 (×2): qty 0.4

## 2023-12-07 MED ORDER — ACETAMINOPHEN 650 MG RE SUPP
650.0000 mg | Freq: Four times a day (QID) | RECTAL | Status: DC | PRN
Start: 2023-12-07 — End: 2023-12-09

## 2023-12-07 MED ORDER — LACTATED RINGERS IV BOLUS
1000.0000 mL | Freq: Once | INTRAVENOUS | Status: AC
  Administered 2023-12-07: 1000 mL via INTRAVENOUS

## 2023-12-07 MED ORDER — ACETAMINOPHEN 325 MG PO TABS
650.0000 mg | ORAL_TABLET | Freq: Four times a day (QID) | ORAL | Status: DC | PRN
  Administered 2023-12-08 (×2): 650 mg via ORAL
  Filled 2023-12-07 (×2): qty 2

## 2023-12-07 MED ORDER — SODIUM CHLORIDE 0.9 % IV SOLN: INTRAVENOUS | Status: DC

## 2023-12-07 MED ORDER — LEVETIRACETAM (KEPPRA) 500 MG/5 ML ADULT IV PUSH: 1000.0000 mg | Freq: Two times a day (BID) | INTRAVENOUS | Status: DC

## 2023-12-07 MED ORDER — LEVETIRACETAM (KEPPRA) 500 MG/5 ML ADULT IV PUSH
1000.0000 mg | Freq: Two times a day (BID) | INTRAVENOUS | Status: DC
  Administered 2023-12-08 (×3): 1000 mg via INTRAVENOUS
  Filled 2023-12-07 (×4): qty 10

## 2023-12-07 MED ORDER — INSULIN ASPART 100 UNIT/ML IJ SOLN
0.0000 [IU] | INTRAMUSCULAR | Status: DC
  Administered 2023-12-08: 3 [IU] via SUBCUTANEOUS
  Administered 2023-12-08 (×2): 1 [IU] via SUBCUTANEOUS
  Administered 2023-12-08: 3 [IU] via SUBCUTANEOUS

## 2023-12-07 MED ORDER — LORAZEPAM 2 MG/ML IJ SOLN
2.0000 mg | Freq: Once | INTRAMUSCULAR | Status: AC
  Administered 2023-12-07: 2 mg via INTRAVENOUS

## 2023-12-07 MED ORDER — LEVETIRACETAM (KEPPRA) 500 MG/5 ML ADULT IV PUSH
4500.0000 mg | Freq: Once | INTRAVENOUS | Status: AC
  Administered 2023-12-07: 4500 mg via INTRAVENOUS
  Filled 2023-12-07: qty 45

## 2023-12-07 MED ORDER — LORAZEPAM 2 MG/ML IJ SOLN
INTRAMUSCULAR | Status: AC
  Administered 2023-12-07: 2 mg via INTRAVENOUS
  Filled 2023-12-07: qty 1

## 2023-12-07 MED ORDER — LORAZEPAM 2 MG/ML IJ SOLN: 2.0000 mg | Freq: Once | INTRAMUSCULAR | Status: AC

## 2023-12-07 MED ORDER — LORAZEPAM 2 MG/ML IJ SOLN
INTRAMUSCULAR | Status: AC
  Filled 2023-12-07: qty 1

## 2023-12-07 MED ORDER — OLANZAPINE 10 MG IM SOLR
2.5000 mg | Freq: Once | INTRAMUSCULAR | Status: AC
  Administered 2023-12-07: 2.5 mg via INTRAMUSCULAR
  Filled 2023-12-07: qty 10

## 2023-12-07 MED ORDER — IOHEXOL 350 MG/ML SOLN
75.0000 mL | Freq: Once | INTRAVENOUS | Status: AC | PRN
  Administered 2023-12-07: 75 mL via INTRAVENOUS

## 2023-12-07 NOTE — ED Notes (Signed)
 Patient is speaking with family and states, is this real?  Patient attempting to take out his IV but he is redirectable by family.

## 2023-12-07 NOTE — ED Notes (Signed)
 Spoke with Dr. Tan about possible transfer for room 11. [facility] spoke with [facility] at [facility] about [facility]. Waiting for call back

## 2023-12-07 NOTE — ED Notes (Signed)
 [facility] called back with bed assignment for patient. 2 west room 02

## 2023-12-07 NOTE — ED Notes (Signed)
 While this RN was drawing blood, pt went unresponsive. [place], MD, to bedside. Code cart at bedside.

## 2023-12-07 NOTE — ED Provider Notes (Signed)
 [facility] Provider Note    Event Date/Time   First MD Initiated Contact with Patient 12/07/23 0745     (approximate)   History   Extremity Weakness   HPI  Jon [name] is a 73 y.o. 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  He denies any chest pain or shortness of breath, does note a headache.  He denies any urinary symptoms or abdominal pain or back pain.  No tenderness to his extremities he denies any vision changes.  Per independent history from wife, his last normal was on Saturday.  States that for the past couple days he has been working on the yard in the heat.  Has been more altered in week.  She also noted that his left lower extremity had been weaker compared to prior.  He had noted to her that he was having decreased sensation to his left lower extremity.  She states that he presented previously for his stroke with left lower extremity symptoms.  No prior history of alcohol use or drug use.  No history of seizures.  She does state that he has had panic attacks in the past but it would manifest as syncopal episodes.  Wife did say that he had a fall yesterday but did not hit his head.   On independent chart review, he was seen by neurology in February, has history of right frontal infarcts that were concerning for embolic etiology, has history of small left temporal AVM.  Was also noted to have a syncopal episode in the setting of stress, where he was amnesic to events, no tongue biting or incontinence, but he did report some involuntary left lower extremity movements after that was concerning for possible seizure.   Physical Exam   Triage Vital Signs: ED Triage Vitals  Encounter Vitals Group     BP 12/07/23 0750 (!) 166/100     Girls Systolic BP Percentile --      Girls Diastolic  BP Percentile --      Boys Systolic BP Percentile --      Boys Diastolic BP Percentile --      Pulse Rate 12/07/23 0750 (!) 121     Resp 12/07/23 0748 20     Temp --      Temp src --      SpO2 12/07/23 0748 99 %     Weight 12/07/23 0749 180 lb 12.4 oz (82 kg)     Height 12/07/23 0749 5' 6 (1.676 m)     Head Circumference --      Peak Flow --      Pain Score 12/07/23 0749 0     Pain Loc --      Pain Education --      Exclude from Growth Chart --     Most recent vital signs: Vitals:   12/07/23 1200 12/07/23 1230  BP: 120/72 112/60  Pulse: 61 (!) 55  Resp: 17   SpO2: 98% 97%     General: Awake, slightly sleepy, slow to respond but able to follow commands CV:  Good peripheral perfusion.  Resp:  Normal effort.  Clear Abd:  No distention.  Soft nontender Other:  Pupils are equal and reactive, extraocular movements are intact, no facial droop or facial asymmetry, he has no weakness or numbness to his face and bilateral upper extremities, he does  have weakness as well as decree sensation to his left lower extremity.  Equal DP and radial pulses bilaterally.  No midline spinal tenderness, no tenderness to his extremities.   ED Results / Procedures / Treatments   Labs (all labs ordered are listed, but only abnormal results are displayed) Labs Reviewed  COMPREHENSIVE METABOLIC PANEL WITH GFR - Abnormal; Notable for the following components:      Result Value   Glucose, Bld 231 (*)    All other components within normal limits  CBC - Abnormal; Notable for the following components:   WBC 3.9 (*)    All other components within normal limits  LACTIC ACID, PLASMA - Abnormal; Notable for the following components:   Lactic Acid, Venous 2.9 (*)    All other components within normal limits  LACTIC ACID, PLASMA - Abnormal; Notable for the following components:   Lactic Acid, Venous 2.7 (*)    All other components within normal limits  URINALYSIS, W/ REFLEX TO CULTURE (INFECTION  SUSPECTED) - Abnormal; Notable for the following components:   Color, Urine STRAW (*)    APPearance CLEAR (*)    Glucose, UA 150 (*)    All other components within normal limits  CBG MONITORING, ED - Abnormal; Notable for the following components:   Glucose-Capillary 221 (*)    All other components within normal limits  CULTURE, BLOOD (ROUTINE X 2)  CULTURE, BLOOD (ROUTINE X 2)  PROTIME-INR  CK  TROPONIN I (HIGH SENSITIVITY)  TROPONIN I (HIGH SENSITIVITY)     EKG  EKG shows, atrial fibrillation, rate of 83, normal QRS, normal QTc, T wave flattening in aVL, atrial fibrillation was not seen on prior EKG in March   RADIOLOGY On my independent interpretation, CT head without obvious intracranial hemorrhage   PROCEDURES:  Critical Care performed: Yes, see critical care procedure note(s)  .Critical Care  Performed by: Waymond Lorelle Cummins, MD Authorized by: Waymond Lorelle Cummins, MD   Critical care provider statement:    Critical care time (minutes):  40   Critical care was necessary to treat or prevent imminent or life-threatening deterioration of the following conditions:  CNS failure or compromise   Critical care was time spent personally by me on the following activities:  Development of treatment plan with patient or surrogate, discussions with consultants, evaluation of patient's response to treatment, examination of patient, ordering and review of laboratory studies, ordering and review of radiographic studies, ordering and performing treatments and interventions, pulse oximetry, re-evaluation of patient's condition and review of old charts    MEDICATIONS ORDERED IN ED: Medications  LORazepam  (ATIVAN ) 2 MG/ML injection ( Intravenous Not Given 12/07/23 0839)  LORazepam  (ATIVAN ) injection 2 mg (2 mg Intravenous Given 12/07/23 0803)  LORazepam  (ATIVAN ) injection 2 mg (2 mg Intravenous Given 12/07/23 0805)  iohexol  (OMNIPAQUE ) 350 MG/ML injection 75 mL (75 mLs Intravenous Contrast Given  12/07/23 0842)  levETIRAcetam  (KEPPRA ) undiluted injection 4,500 mg (4,500 mg Intravenous Given 12/07/23 0935)  sodium chloride  0.9 % bolus 1,000 mL (0 mLs Intravenous Stopped 12/07/23 1213)  lactated ringers  bolus 1,000 mL (1,000 mLs Intravenous New Bag/Given 12/07/23 1057)  OLANZapine  (ZYPREXA ) injection 2.5 mg (2.5 mg Intramuscular Given by Other 12/07/23 1118)     IMPRESSION / MDM / ASSESSMENT AND PLAN / ED COURSE  I reviewed the triage vital signs and the nursing notes.  Differential diagnosis includes, but is not limited to, CVA, intracranial hemorrhage, TIA, seizure, infectious workup, atypical ACS, electrolyte derangements, dehydration.  Given that wife reported a fall, also had CT cervical spine.  Labs, EKG, troponin, chest x-ray, CT head, CT angio head and neck, UA  Patient's presentation is most consistent with acute presentation with potential threat to life or bodily function.  Was called back to bedside because patient had an episode where he was gasping, became less responsive, had a staring spell, lasted for several seconds and resolved.  When I was in the room, he had another staring spell, got was gasping, had right upper extremity shaking, was given 2 rounds of 2 mg Ativan  with resolution of the symptoms.  Patient is able to answer questions although still sleepy, following commands.  Will reach out to neurology for further recommendations.  Independent interpretation of labs and imaging below.  Clinical course as below.  Discussed with wife about transfer in [place] including recent benefits, she is agreeable with this plan.  Waiting on a callback from transfer center to get him transferred to [facility].  Spoke with Dr. Melvin who is hospitalist at [facility] who accepted the transfer.  EMTALA form was filled out.  The patient is on the cardiac monitor to evaluate for evidence of arrhythmia and/or significant heart rate changes.   Clinical Course as of  12/07/23 1328  Wed Dec 07, 2023  9[facility] neurology who recommended IV Keppra  60 mg/kg.  States to observe him and he if he returns to mental baseline able to be admitted here if not he will need to be transferred to [facility].  She will come down to see him. [TT]  0916 CT Angio Head Neck W WO CM IMPRESSION: 1. 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 2. 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 3. Mild atheromatous plaque within the proximal internal carotid arteries with less than 10% luminal stenosis bilaterally. 4. Chronic left parietal infarct and moderate cerebral white matter disease.   [TT]  9083 DG Chest 1 View No active disease.  [TT]  0946 CT C-SPINE NO CHARGE 1. Mild chronic degenerative disc disease at C5-6 and C6-7.  [TT]  1036 On reassessment patient is awake, following commands, unsure where he is at this time, not quite back to baseline, discussed neurology recommendations with wife, will continue to monitor and order an MRI. [TT]  1109 Urinalysis, w/ Reflex to Culture (Infection Suspected) -Urine, Clean Catch(!) Not consistent with UTI [TT]  1113 Patient becoming agitated, quite confused, not consistently verbally redirectable, no seizure-like activity, will give him a small dose of Zyprexa  due to the agitation. [TT]  [id number] Neurology evaluated the patient, recommended transfer to [facility] for continuous EEG.  Will put alcohol to transfer center.  She will reach out to neurologist there, I will talk to the hospitalist for admission. [TT]    Clinical Course User Index [TT] Waymond Lorelle Cummins, MD     FINAL CLINICAL IMPRESSION(S) / ED DIAGNOSES   Final diagnoses:  Seizure (HCC)  Altered mental status, unspecified altered mental status type  Weakness  Decreased sensation     Rx / DC Orders   ED Discharge Orders     None        Note:  This document was prepared using Dragon voice recognition software and may include unintentional  dictation errors.    Waymond Lorelle Cummins, MD 12/07/23 [phone]

## 2023-12-07 NOTE — Progress Notes (Signed)
 Plan of Care Note for accepted transfer   Patient: Jon [name] MRN: [id number]   DOA: (Not on file)  Facility requesting transfer: [facility] Requesting Provider: DR. Waymond (EDP) Reason for transfer: Services not available Facility course:   Patient with history of hypertension, hyperlipidemia, diabetes, TIA/CVA with some left-sided deficits which had been improved, migraines, GERD, chronic left subclavian dissection, depression, nontraumatic intracerebral hemorrhage, OSA, chronic pain presenting with weakness.  Patient working outside recently and has had some increased generalized weakness.  Woke up this morning with decree sensation and strength in his left lower extremity.  As above, prior CVA with left-sided deficits which had improved.  Has had a history of abnormal reactions to stress/anxiety.  Following an episode of stress/anxiety he had syncopal event with associated amnesia.  Reportedly there was some involuntary movement afterwards which raise some concern for possible seizure but not currently on seizure medications.  While at [facility] ED patient had seizure-like episode and received Ativan  with some improvement however he did not return to his baseline and neurology recommended Keppra  and transfer if he continues to remain altered.  Patient had continued agitation and recommendation was for transfer to [facility].  Did receive Zyprexa  in the ED as well.  Neurology at [facility] are to notify neurology on-call here but I would still notify/consult neurology here on patient arrival.  Plan of care: The patient is accepted for admission to Telemetry unit, at [facility]   Author: Marsa KATHEE Scurry, MD 12/07/2023  Check [email or web address] for on-call coverage.  Nursing staff, Please call [facility] Admits & Consults System-Wide number on Amion as soon as patient's arrival, so appropriate admitting provider can evaluate the pt.

## 2023-12-07 NOTE — ED Triage Notes (Addendum)
 States he woke at 0400 this morning with L Leg numbness and weakness, pt states dizziness and HX of strokes. Pt slow to respond, MD tan at bedside. Primary RN at bedside

## 2023-12-07 NOTE — Progress Notes (Addendum)
 Ceribell connected, no ongoing seizure on initial review, will continue to monitor.   Jon Seals, MD [facility]   If 7pm- 7am, please page neurology on call as listed in AMION.

## 2023-12-07 NOTE — Plan of Care (Incomplete)
 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   Symptoms improved after he was given IV Ativan .  Family reported patient having altered mental status and staring spells over the past few days.  He was loaded with IV Keppra  4.5 g in the ED.  Patient had not returned to his baseline mental status.  He became increasingly agitated and required Zyprexa .  CTA head and neck showed no acute process.  CT C-spine negative for acute findings.  Brain MRI was done.  Neurology recommended transfer to [facility] for cEEG to rule out non-convulsive status.  Recommended continuing Keppra  1000 mg twice daily.  Labs done in the ED notable for WBC count 3.9, glucose 231, bicarb 25, troponin negative x 2, CK normal, lactic acid 2.9>2.7 and was given IV fluids, blood cultures in process, UA not suggestive of infection.  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  EKG: Sinus rhythm with PACs, no significant change since previous tracing.

## 2023-12-07 NOTE — ED Notes (Signed)
 Pt declining with decreased LOC. Zoll pads applied. 2nd IV obtained. EDP remains at bedside

## 2023-12-07 NOTE — H&P (Signed)
 History and Physical    Jon [name] FMW:[id number] DOB: [birth year] DOA: 12/07/2023  PCP: [facility] [name] BIRCH, MD  Patient coming from: [name]  Chief Complaint: Left lower extremity weakness/numbness  HPI: Jon [name] is a 73 y.o. 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   Symptoms improved after he was given IV Ativan .  Family reported patient having altered mental status and staring spells over the past few days.  He was loaded with IV Keppra  4.5 g in the ED.  Patient had not returned to his baseline mental status.  He became increasingly agitated and required Zyprexa .  CTA head and neck showed no acute process.  CT C-spine negative for acute findings.  Neurology recommended brain MRI and transfer to [facility] for cEEG to rule out non-convulsive status.  Recommended continuing Keppra  1000 mg twice daily.  Labs done in the ED notable for WBC count 3.9, glucose 231, bicarb 25, troponin negative x 2, CK normal, lactic acid 2.9>2.7 and was given IV fluids, blood cultures in process, UA not suggestive of infection.  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  Patient is currently confused and nonverbal.  He is not able to give any history.  Wife at bedside states patient has history of previous stroke with residual left-sided weakness but for the past few days he is having increasing weakness in his left lower extremity and also complaining of numbness in this leg.  Wife  states in the past patient has had episodes of syncope in setting of severe anxiety/panic attacks but this has not happened for several months.  No fevers or seizure-like activity noted at home.  Wife is concerned that patient has been working outside in hot weather and might be dehydrated.  Review of Systems:  Review of Systems  Reason unable to perform ROS: AMS.    Past Medical History:  Diagnosis Date   AVM (arteriovenous malformation) brain    Diabetes mellitus without complication (HCC)    Hypertension    Pituitary tumor    TIA (transient ischemic attack)     Past Surgical History:  Procedure Laterality Date   COLONOSCOPY     EYE SURGERY     HERNIA REPAIR       reports that he has never smoked. He has never used smokeless tobacco. He reports that he does not drink alcohol and does not use drugs.  Allergies  Allergen Reactions   Beta Adrenergic Blockers Itching   Topiramate Nausea And Vomiting    Other reaction(s): Dizziness, Hallucination Other Reaction: violence   Wasp Venom Anaphylaxis   Aspirin  Other (See Comments)    Per pt's PCP due to spontaneous bleeding in his brain.   Benadryl  [Diphenhydramine ] Hives   Lisinopril Itching    Other reaction(s): Other (See Comments) Leg pain   Nabumetone Other (See Comments)    Reaction: unknown   Valproic  Acid Other (See Comments)    Reaction: Headache    Family History  Problem Relation Age of Onset   Diabetes Mellitus II Other  Maternal aunt    Prior to Admission medications   Medication Sig Start Date End Date Taking? Authorizing Provider  albuterol  (VENTOLIN  HFA) 108 (90 Base) MCG/ACT inhaler Inhale 2 puffs into the lungs every 6 (six) hours as needed for wheezing or shortness of breath. 02/22/22   Cyrena Mylar, MD  amLODipine  (NORVASC ) 5 MG tablet Take 5 mg by mouth daily.    [provider]  aspirin  EC 81 MG tablet Take 1 tablet (81 mg total) by mouth daily. Swallow whole. 11/21/22   Patel, Sona, MD   atorvastatin  (LIPITOR) 80 MG tablet Take 1 tablet (80 mg total) by mouth daily. 11/21/22   Patel, Sona, MD  butalbital -acetaminophen -caffeine  (FIORICET ) 50-325-40 MG tablet Take 2 tablets by mouth every 6 (six) hours as needed. 11/24/22   [provider]  clonazePAM  (KLONOPIN ) 0.5 MG tablet Take 0.5 mg by mouth 2 (two) times daily as needed. 08/17/22   [provider]  DULoxetine  (CYMBALTA ) 30 MG capsule Take 30 mg by mouth daily. 06/30/22   [provider]  EPINEPHrine  0.3 mg/0.3 mL IJ SOAJ injection Inject 0.3 mg into the muscle as needed for anaphylaxis. 02/22/22   Cyrena Mylar, MD  ezetimibe  (ZETIA ) 10 MG tablet Take 10 mg by mouth daily.    [provider]  fluticasone-salmeterol (ADVAIR) 250-50 MCG/ACT AEPB Inhale 1 puff into the lungs in the morning and at bedtime. 06/30/22 06/30/23  [provider]  glipiZIDE (GLUCOTROL XL) 10 MG 24 hr tablet Take 10 mg by mouth daily.    [provider]  pantoprazole  (PROTONIX ) 40 MG tablet Take 40 mg by mouth daily. 08/11/22   [provider]  testosterone  cypionate (DEPOTESTOSTERONE CYPIONATE) 200 MG/ML injection Inject 200 mg into the muscle every 28 (twenty-eight) days. 12/08/22   [provider]  traZODone  (DESYREL ) 100 MG tablet Take 100 mg by mouth at bedtime. 10/29/22   [provider]    Physical Exam: There were no vitals filed for this visit.  Physical Exam Vitals reviewed.  Constitutional:      General: He is not in acute distress. HENT:     Head: Normocephalic and atraumatic.  Eyes:     Extraocular Movements: Extraocular movements intact.  Cardiovascular:     Rate and Rhythm: Normal rate and regular rhythm.     Pulses: Normal pulses.  Pulmonary:     Effort: Pulmonary effort is normal. No respiratory distress.     Breath sounds: Normal breath sounds. No wheezing or rales.  Abdominal:     General: Bowel sounds are normal. There is no distension.     Palpations:  Abdomen is soft.     Tenderness: There is no abdominal tenderness. There is no guarding.  Musculoskeletal:     Cervical back: Normal range of motion.     Right lower leg: No edema.     Left lower leg: No edema.  Skin:    General: Skin is warm and dry.  Neurological:     Mental Status: He is alert.     Comments: Moving all extremities on command, no focal weakness     Labs on Admission: I have personally reviewed following labs and imaging studies  CBC: Recent Labs  Lab 12/07/23 0800  WBC 3.9*  HGB 14.1  HCT 40.3  MCV 91.0  PLT 167   Basic Metabolic Panel: Recent Labs  Lab 12/07/23 0800  NA 139  K 4.1  CL 103  CO2 25  GLUCOSE 231*  BUN 16  CREATININE 0.87  CALCIUM  9.4   GFR: Estimated Creatinine Clearance: 76 mL/min (by C-G formula based on SCr of 0.87 mg/dL). Liver Function Tests: Recent Labs  Lab 12/07/23 0800  AST 22  ALT 23  ALKPHOS 74  BILITOT 0.5  PROT 6.9  ALBUMIN 4.1   No results for input(s): LIPASE, AMYLASE in the last 168 hours. No results for input(s): AMMONIA in the last 168 hours. Coagulation Profile: Recent Labs  Lab 12/07/23 0800  INR 0.9   Cardiac Enzymes: Recent Labs  Lab 12/07/23 0800  CKTOTAL 87   BNP (last 3 results) No results for input(s): PROBNP in the last 8760 hours. HbA1C: No results for input(s): HGBA1C in the last 72 hours. CBG: Recent Labs  Lab 12/07/23 0751  GLUCAP 221*   Lipid Profile: No results for input(s): CHOL, HDL, LDLCALC, TRIG, CHOLHDL, LDLDIRECT in the last 72 hours. Thyroid Function Tests: No results for input(s): TSH, T4TOTAL, FREET4, T3FREE, THYROIDAB in the last 72 hours. Anemia Panel: No results for input(s): VITAMINB12, FOLATE, FERRITIN, TIBC, IRON, RETICCTPCT in the last 72 hours. Urine analysis:    Component Value Date/Time   COLORURINE STRAW (A) 12/07/2023 1035   APPEARANCEUR CLEAR (A) 12/07/2023 1035   APPEARANCEUR Clear 10/07/2020 0854    LABSPEC 1.023 12/07/2023 1035   PHURINE 7.0 12/07/2023 1035   GLUCOSEU 150 (A) 12/07/2023 1035   HGBUR NEGATIVE 12/07/2023 1035   BILIRUBINUR NEGATIVE 12/07/2023 1035   BILIRUBINUR Negative 10/07/2020 0854   KETONESUR NEGATIVE 12/07/2023 1035   PROTEINUR NEGATIVE 12/07/2023 1035   NITRITE NEGATIVE 12/07/2023 1035   LEUKOCYTESUR NEGATIVE 12/07/2023 1035    Radiological Exams on Admission: CT C-SPINE NO CHARGE Result Date: 12/07/2023 CLINICAL DATA:  Altered mental status. EXAM: CT CERVICAL SPINE WITHOUT CONTRAST TECHNIQUE: Multidetector CT imaging of the cervical spine was performed without intravenous contrast. Multiplanar CT image reconstructions were also generated. RADIATION DOSE REDUCTION: This exam was performed according to the departmental dose-optimization program which includes automated exposure control, adjustment of the mA and/or kV according to patient size and/or use of iterative reconstruction technique. COMPARISON:  CT of the cervical spine dated December 06, 2020. FINDINGS: Alignment: The cervical vertebrae maintain their height and alignment. Skull base and vertebrae: No evidence of fracture or osseous lesion. Soft tissues and spinal canal: No paraspinous hematoma or soft tissue swelling. Disc levels: There is mild posterior disc bulging and endplate ridging at C5-6 and C6-7, causing mild central spinal canal stenosis at both levels. There is mild-to-moderate left neural foraminal stenosis at C5-6 and C6-7. The other disc space levels are unremarkable. Upper chest: The visualized lung apices are clear. Other: None. IMPRESSION: 1. Mild chronic degenerative disc disease at C5-6 and C6-7. Electronically Signed   By: Evalene Coho M.D.   On: 12/07/2023 09:38   DG Chest 1 View Result Date: 12/07/2023 CLINICAL DATA:  Altered mental status. Woke up this morning with left leg numbness and weakness. Dizziness. EXAM: CHEST  1 VIEW COMPARISON:  08/05/2023 FINDINGS: Lungs are hypoinflated  without focal airspace consolidation or effusion. Cardiomediastinal silhouette is unremarkable. Enchondroma over the proximal left humerus. Degenerative changes of the spine. IMPRESSION: No active disease. Electronically Signed   By: Toribio Agreste M.D.   On: 12/07/2023 09:11   CT Angio Head Neck W WO CM Result Date: 12/07/2023 CLINICAL DATA:  Neuro deficit, acute, stroke suspected EXAM: CT ANGIOGRAPHY HEAD AND NECK WITH AND WITHOUT CONTRAST TECHNIQUE: Multidetector CT imaging of the head and neck was performed using the standard protocol  during bolus administration of intravenous contrast. Multiplanar CT image reconstructions and MIPs were obtained to evaluate the vascular anatomy. Carotid stenosis measurements (when applicable) are obtained utilizing NASCET criteria, using the distal internal carotid diameter as the denominator. RADIATION DOSE REDUCTION: This exam was performed according to the departmental dose-optimization program which includes automated exposure control, adjustment of the mA and/or kV according to patient size and/or use of iterative reconstruction technique. CONTRAST:  75mL OMNIPAQUE  IOHEXOL  350 MG/ML SOLN COMPARISON:  CT angiogram of the head and neck dated May 04, 2023. FINDINGS: CT HEAD FINDINGS Brain: Age-related atrophy and left parietal encephalomalacia are again demonstrated. There is moderate periventricular and deep cerebral white matter disease. No evidence of hemorrhage, mass, acute cortical infarct or hydrocephalus. Vascular: Moderate calcific atheromatous disease within the carotid siphons. Skull: Intact and unremarkable. Sinuses/Orbits: Clear paranasal sinuses. Status post bilateral lens replacement. Other: None. Review of the MIP images confirms the above findings CTA NECK FINDINGS Aortic arch: Mild calcific plaque without aneurysm or stenosis. Standard branching. The brachiocephalic artery is widely patent. 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 Right  carotid system: Mild calcific plaque within the right carotid bulb and origin of the internal carotid artery, with less than 10% luminal stenosis. Left carotid system: Mild calcific and noncalcific plaque within the proximal left internal carotid artery, with less than 10% luminal stenosis. Vertebral arteries: Mild calcific plaque within the V1 segments of the vertebral arteries without flow-limiting stenosis. The V2 segments are patent bilaterally. Skeleton: Mild degenerative changes within the cervical spine. Other neck: None. Upper chest: The lung apices are clear. Review of the MIP images confirms the above findings CTA HEAD FINDINGS Anterior circulation: There is mild calcific plaque within the communicating segments of the internal carotid arteries with no flow-limiting stenosis. The anterior and middle cerebral arteries and their branches appear normal in caliber. There is an arteriovenous malformation again demonstrated within the left temporal lobe supplied by left MCA branches, which are not significantly dilated. Posterior circulation: The vertebrobasilar system is unremarkable. The posterior cerebral arteries are normal in caliber. A left posterior communicating artery is present. None is clearly demonstrated on the right. The cerebellar arteries are patent. Venous sinuses: Patent. Anatomic variants: None. Review of the MIP images confirms the above findings IMPRESSION: 1. 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 2. 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 3. Mild atheromatous plaque within the proximal internal carotid arteries with less than 10% luminal stenosis bilaterally. 4. Chronic left parietal infarct and moderate cerebral white matter disease. Electronically Signed   By: Evalene Coho M.D.   On: 12/07/2023 09:06    Assessment and Plan  Seizures Patient with history of previous stroke with mild residual left-sided weakness presented to [facility] ED for evaluation  of decreased sensation and strength of his left lower extremity and while in the ED he had witnessed seizure-like activity x 2.  Symptoms improved after he was given IV Ativan .  He was seen by neurology and was given loading dose IV Keppra  4.5 g in the ED.  Since patient's mental status did not return back to his baseline, patient was transferred to [facility] tonight for continuous EEG monitoring.  He did receive Zyprexa  in the ED for agitation.  CTA head and neck showed no acute process.  Brain MRI ordered.  Neurology consulted on arrival to [facility] and patient has been started on EEG monitoring but continues to have agitation requiring restraints.  Additional IV Ativan   1 mg x 1 ordered.  Seizure precautions.  Continue Keppra  1000 mg twice daily.  Type 2 diabetes Glucose 231.  Last A1c 7.4 in November 2024, repeat ordered.  Placed on sensitive sliding scale insulin .  Lactic acidosis Dehydration could be contributing.  No obvious infectious source or signs of sepsis.  Continue IV fluid hydration and trend lactate.  Follow-up blood cultures and check procalcitonin level.  Hypertension Hyperlipidemia History of previous stroke GERD Depression/anxiety Pharmacy med rec pending.  DVT prophylaxis: Lovenox  Code Status: Full Code (discussed with the patient's wife) Family Communication: Wife at bedside. Consults called: Neurology Level of care: Progressive Care Unit Admission status: It is my clinical opinion that admission to INPATIENT is reasonable and necessary because of the expectation that this patient will require hospital care that crosses at least 2 midnights to treat this condition based on the medical complexity of the problems presented.  Given the aforementioned information, the predictability of an adverse outcome is felt to be significant.  Editha Ram MD [facility]  If 7PM-7AM, please contact night-coverage [email or web address]  12/07/2023, 9:50 PM

## 2023-12-07 NOTE — Consult Note (Signed)
 NEUROLOGY CONSULT NOTE   Date of service: December 07, 2023 Patient Name: Jon [name] MRN:  [id number] DOB:  03-04-1951 Chief Complaint: seizure-like activity Requesting Provider: Dr. Lorelle Cheadle  History of Present Illness  Jon [name] is a 73 y.o. male with hx of prior CVA with some left-sided weakness, known left temporal AVM, hypertension, hyperlipidemia, diabetes, prior subarachnoid hemorrhage, prior syncopal episodes who presented with decreased sensation and strength in his left lower extremity.  In the ED he was noted to have multiple staring spells with shaking of his left upper extremity.  These were aborted 2x with IV Ativan .  Wife states that he has been altered and having staring spells since Sunday which was 4 days ago.  He was previously seen by Dr. Hosie at [facility] for post stroke care.  At the time he reported 2 episodes of syncope which were associated with increased left leg weakness but no motor seizure activity, no incontinence, no tongue biting.  He is not on antiepileptic medications at home.  He was loaded with 4.5 g of Keppra  in the ED.  He has not returned to baseline mental status.  He has become increasingly agitated, pulling at lines, required olanzapine .  CT head and CTA head and neck showed no acute process.  MRI brain is pending.   ROS   Unable to ascertain due to AMS  Past History   Past Medical History:  Diagnosis Date   AVM (arteriovenous malformation) brain    Diabetes mellitus without complication (HCC)    Hypertension    Pituitary tumor    TIA (transient ischemic attack)     Past Surgical History:  Procedure Laterality Date   COLONOSCOPY     EYE SURGERY     HERNIA REPAIR      Family History: Family History  Problem Relation Age of Onset   Diabetes Mellitus II Other        Maternal aunt    Social History  reports that he has never smoked. He has never used smokeless tobacco. He reports that he does not drink alcohol and does not use  drugs.  Allergies  Allergen Reactions   Beta Adrenergic Blockers Itching   Topiramate Nausea And Vomiting    Other reaction(s): Dizziness, Hallucination Other Reaction: violence   Wasp Venom Anaphylaxis   Aspirin  Other (See Comments)    Per pt's PCP due to spontaneous bleeding in his brain.   Benadryl  [Diphenhydramine ] Hives   Lisinopril Itching    Other reaction(s): Other (See Comments) Leg pain   Nabumetone Other (See Comments)    Reaction: unknown   Valproic  Acid Other (See Comments)    Reaction: Headache    Medications   Current Facility-Administered Medications:    levETIRAcetam  (KEPPRA ) undiluted injection 1,000 mg, 1,000 mg, Intravenous, Q12H, Matthews Elida HERO, MD   LORazepam  (ATIVAN ) 2 MG/ML injection, , , ,   Current Outpatient Medications:    albuterol  (VENTOLIN  HFA) 108 (90 Base) MCG/ACT inhaler, Inhale 2 puffs into the lungs every 6 (six) hours as needed for wheezing or shortness of breath., Disp: 8 g, Rfl: 2   amLODipine  (NORVASC ) 5 MG tablet, Take 5 mg by mouth daily., Disp: , Rfl:    aspirin  EC 81 MG tablet, Take 1 tablet (81 mg total) by mouth daily. Swallow whole., Disp: 30 tablet, Rfl: 12   atorvastatin  (LIPITOR) 80 MG tablet, Take 1 tablet (80 mg total) by mouth daily., Disp: 30 tablet, Rfl: 2   butalbital -acetaminophen -caffeine  (FIORICET ) 50-325-40 MG  tablet, Take 2 tablets by mouth every 6 (six) hours as needed., Disp: , Rfl:    clonazePAM  (KLONOPIN ) 0.5 MG tablet, Take 0.5 mg by mouth 2 (two) times daily as needed., Disp: , Rfl:    DULoxetine  (CYMBALTA ) 30 MG capsule, Take 30 mg by mouth daily., Disp: , Rfl:    EPINEPHrine  0.3 mg/0.3 mL IJ SOAJ injection, Inject 0.3 mg into the muscle as needed for anaphylaxis., Disp: 1 each, Rfl: 0   ezetimibe  (ZETIA ) 10 MG tablet, Take 10 mg by mouth daily., Disp: , Rfl:    fluticasone-salmeterol (ADVAIR) 250-50 MCG/ACT AEPB, Inhale 1 puff into the lungs in the morning and at bedtime., Disp: , Rfl:    glipiZIDE (GLUCOTROL  XL) 10 MG 24 hr tablet, Take 10 mg by mouth daily., Disp: , Rfl:    pantoprazole  (PROTONIX ) 40 MG tablet, Take 40 mg by mouth daily., Disp: , Rfl:    testosterone  cypionate (DEPOTESTOSTERONE CYPIONATE) 200 MG/ML injection, Inject 200 mg into the muscle every 28 (twenty-eight) days., Disp: , Rfl:    traZODone  (DESYREL ) 100 MG tablet, Take 100 mg by mouth at bedtime., Disp: , Rfl:   Vitals   Vitals:   12/24/23 0945 24-Dec-2023 1030 Dec 24, 2023 1200 December 24, 2023 1230  BP: 108/79 118/64 120/72 112/60  Pulse: 68 66 61 (!) 55  Resp: 13 16 17    SpO2: 92% 99% 98% 97%  Weight:      Height:        Body mass index is 29.18 kg/m.   Physical Exam   Gen: patient lying in bed, intermittently lethargic then agitated CV: extremities appear well-perfused Resp: normal WOB  Neurologic exam MS: intermittently lethargic then agitated, oriented to self only Speech: moderate dysarthria CN: PERRL, blinks to threat bilat, EOMI, sensation intact, L UMN facial droop, hearing intact to voice Motor: drift in RUE but not to bed, LUE drift to bed, wiggles toes bilat Sensory: withdraws to noxious stimuli less briskly on L than R Coordination: UTA Gait: deferred   Labs/Imaging/Neurodiagnostic studies   CBC:  Recent Labs  Lab 12-24-23 0800  WBC 3.9*  HGB 14.1  HCT 40.3  MCV 91.0  PLT 167   Basic Metabolic Panel:  Lab Results  Component Value Date   NA 139 12/24/23   K 4.1 12-24-2023   CO2 25 12/24/23   GLUCOSE 231 (H) 12-24-2023   BUN 16 12-24-23   CREATININE 0.87 Dec 24, 2023   CALCIUM  9.4 12-24-2023   GFRNONAA >60 12/24/2023   GFRAA >60 05/14/2019   Lipid Panel:  Lab Results  Component Value Date   LDLCALC 150 (H) 11/19/2022   HgbA1c:  Lab Results  Component Value Date   HGBA1C 8.2 (H) 11/19/2022   Urine Drug Screen:     Component Value Date/Time   LABOPIA NONE DETECTED 12/10/2022 2227   COCAINSCRNUR NONE DETECTED 12/10/2022 2227   LABBENZ NONE DETECTED 12/10/2022 2227    AMPHETMU NONE DETECTED 12/10/2022 2227   THCU NONE DETECTED 12/10/2022 2227   LABBARB NONE DETECTED 12/10/2022 2227    Alcohol Level     Component Value Date/Time   ETH <10 12/10/2022 2227   INR  Lab Results  Component Value Date   INR 0.9 2023-12-24   APTT  Lab Results  Component Value Date   APTT 31 11/18/2022   AED levels: No results found for: PHENYTOIN, ZONISAMIDE, LAMOTRIGINE, LEVETIRACETA  CTA H&N 1. 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 2. 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 3. Mild  atheromatous plaque within the proximal internal carotid arteries with less than 10% luminal stenosis bilaterally. 4. Chronic left parietal infarct and moderate cerebral white matter disease.  ASSESSMENT   Jon [name] is a 73 y.o. male with hx of prior CVA with some left-sided weakness, known left temporal AVM, hypertension, hyperlipidemia, diabetes, prior subarachnoid hemorrhage, prior syncopal episodes who presented with decreased sensation and strength in his left lower extremity and had witnessed seizure activity x2 in ED. He has not returned to baseline and will require transfer to [facility] for cEEG to r/o non-convulsive status.  RECOMMENDATIONS   - MRI brain wwo - Continue keppra  1000mg  bid (s/p 4.5g load) - Transfer to [facility] for cEEG, please notify neurology when patient arrives ______________________________________________________________________    Signed, Elida CHRISTELLA Ross, MD [facility]

## 2023-12-07 NOTE — ED Notes (Signed)
 [place], MD, mad aware of lactic 2.9

## 2023-12-07 NOTE — ED Notes (Signed)
Pt transported to CT 3 

## 2023-12-08 ENCOUNTER — Inpatient Hospital Stay (HOSPITAL_COMMUNITY)

## 2023-12-08 DIAGNOSIS — R569 Unspecified convulsions: Secondary | ICD-10-CM | POA: Diagnosis not present

## 2023-12-08 LAB — GLUCOSE, CAPILLARY
Glucose-Capillary: 126 mg/dL — ABNORMAL HIGH (ref 70–99)
Glucose-Capillary: 137 mg/dL — ABNORMAL HIGH (ref 70–99)
Glucose-Capillary: 140 mg/dL — ABNORMAL HIGH (ref 70–99)
Glucose-Capillary: 153 mg/dL — ABNORMAL HIGH (ref 70–99)
Glucose-Capillary: 157 mg/dL — ABNORMAL HIGH (ref 70–99)
Glucose-Capillary: 201 mg/dL — ABNORMAL HIGH (ref 70–99)
Glucose-Capillary: 233 mg/dL — ABNORMAL HIGH (ref 70–99)

## 2023-12-08 LAB — CBC
HCT: 37.6 % — ABNORMAL LOW (ref 39.0–52.0)
HCT: 38 % — ABNORMAL LOW (ref 39.0–52.0)
Hemoglobin: 13.2 g/dL (ref 13.0–17.0)
Hemoglobin: 13.4 g/dL (ref 13.0–17.0)
MCH: 31.6 pg (ref 26.0–34.0)
MCH: 32 pg (ref 26.0–34.0)
MCHC: 35.1 g/dL (ref 30.0–36.0)
MCHC: 35.3 g/dL (ref 30.0–36.0)
MCV: 89.6 fL (ref 80.0–100.0)
MCV: 91 fL (ref 80.0–100.0)
Platelets: 164 K/uL (ref 150–400)
Platelets: 168 K/uL (ref 150–400)
RBC: 4.13 MIL/uL — ABNORMAL LOW (ref 4.22–5.81)
RBC: 4.24 MIL/uL (ref 4.22–5.81)
RDW: 12 % (ref 11.5–15.5)
RDW: 12 % (ref 11.5–15.5)
WBC: 4.3 K/uL (ref 4.0–10.5)
WBC: 5.6 K/uL (ref 4.0–10.5)
nRBC: 0 % (ref 0.0–0.2)
nRBC: 0 % (ref 0.0–0.2)

## 2023-12-08 LAB — CREATININE, SERUM
Creatinine, Ser: 0.84 mg/dL (ref 0.61–1.24)
GFR, Estimated: >60 mL/min (ref >60)

## 2023-12-08 LAB — C DIFFICILE QUICK SCREEN W PCR REFLEX
C Diff antigen: NEGATIVE
C Diff interpretation: NOT DETECTED
C Diff toxin: NEGATIVE

## 2023-12-08 LAB — LACTIC ACID, PLASMA: Lactic Acid, Venous: 1 mmol/L (ref 0.5–1.9)

## 2023-12-08 LAB — HEMOGLOBIN A1C
Hgb A1c MFr Bld: 8.4 % — ABNORMAL HIGH (ref 4.8–5.6)
Mean Plasma Glucose: 194.38 mg/dL

## 2023-12-08 LAB — PROCALCITONIN: Procalcitonin: <0.1 ng/mL

## 2023-12-08 MED ORDER — GADOBUTROL 1 MMOL/ML IV SOLN
8.0000 mL | Freq: Once | INTRAVENOUS | Status: AC | PRN
  Administered 2023-12-07: 8 mL via INTRAVENOUS

## 2023-12-08 NOTE — Progress Notes (Signed)
 vLTM Started  All impedances below 10k   [facility] monitoring.

## 2023-12-08 NOTE — Progress Notes (Signed)
 PROGRESS NOTE    Jon [name]  FMW:[id number] DOB: Aug 20, 1950 DOA: 12/07/2023 PCP: Auston Reyes BIRCH, MD   Brief Narrative:   73 y.o. 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   Symptoms improved after he was given IV Ativan .  Family reported patient having altered mental status and staring spells over the past few days.  He was loaded with IV Keppra  4.5 g in the ED.  Patient had not returned to his baseline mental status.  He became increasingly agitated and required Zyprexa .  CTA head and neck showed no acute process.  CT C-spine negative for acute findings.  Neurology recommended brain MRI and transfer to [facility] for cEEG to rule out non-convulsive status.   Assessment & Plan:  Principal Problem:   Seizures (HCC) Active Problems:   HTN (hypertension)   Hyperlipidemia, mixed   Type 2 diabetes mellitus (HCC)   Lactic acidosis    Likely Seizures Patient with history of previous stroke with mild residual left-sided weakness presented to [facility] ED for evaluation of decreased sensation and strength of his left lower extremity and while in the ED he had witnessed seizure-like activity x 2.  Symptoms improved after he was given IV Ativan .  He was seen by neurology and was given loading dose IV Keppra  4.5 g in the ED.   Since patient's mental status did not return back to his baseline, patient was transferred to [facility] tonight for continuous EEG monitoring.   He did receive Zyprexa  in the ED for agitation.   CTA  head and neck showed no acute process.   Brain MRI didn't show any acute abnormalities but did show remote infarcts.   Neurology consulted on arrival to [facility] and patient has been started on EEG monitoring. Seizure precautions.   Continue Keppra  1000 mg twice daily. NPO until awake and alert   Type 2 diabetes mellitus:  Glucose 231.  Last A1c 7.4 in November 2024, repeat ordered.  Placed on sensitive sliding scale insulin .   Lactic acidosis, resolved now Dehydration could be contributing.  No obvious infectious source or signs of sepsis.  Continue IV fluid hydration   Hypertension: prn antihypertensives  Hyperlipidemia: resume med once able to take PO  History of previous stroke  GERD  Major moderate Depression/anxiety Resume po meds once able to take po  Disposition: Home. Lives with his wife.  DVT prophylaxis: enoxaparin  (LOVENOX ) injection 40 mg Start: 12/07/23 2300     Code Status: Full Code Family Communication:  None at the bedside Status is: Inpatient Remains inpatient appropriate because: AMS   Subjective:  Patient is altered and unable to communicate. On continuous EEG.  Examination:  General exam: Obtunded, unable to communicate, opens eyes to commands occasionally but not following any other commands Respiratory system: Clear to auscultation. Respiratory effort normal. Cardiovascular system: S1 & S2 heard, RRR. No JVD, murmurs, rubs, gallops or clicks. No pedal edema. Gastrointestinal system: Abdomen is nondistended, soft and nontender. No organomegaly or masses felt. Normal bowel sounds heard. Central nervous system: Obtunded, unable to communicate, opens eyes to commands occasionally but not following  any other commands Extremities: Unable to assess motor and sensory systems  Skin: No rashes, lesions or ulcers        Diet Orders (From admission, onward)     Start     Ordered   12/07/23 2210  Diet NPO time specified  Diet effective now         12/07/23 2212            Objective: There were no vitals filed for this visit.  Intake/Output Summary (Last 24 hours) at 12/08/2023 1017 Last data filed at 12/08/2023 0300 Gross per 24 hour  Intake --  Output 500 ml  Net -500 ml   There were no vitals filed for this visit.  Scheduled Meds:  enoxaparin  (LOVENOX ) injection  40 mg Subcutaneous Q24H   insulin  aspart  0-9 Units Subcutaneous Q4H   levETIRAcetam   1,000 mg Intravenous Q12H   Continuous Infusions:  sodium chloride  125 mL/hr at 12/08/23 0022    Nutritional status     There is no height or weight on file to calculate BMI.  Data Reviewed:   CBC: Recent Labs  Lab 12/07/23 0800 12/08/23 0019 12/08/23 0148  WBC 3.9* 4.3 5.6  HGB 14.1 13.4 13.2  HCT 40.3 38.0* 37.6*  MCV 91.0 89.6 91.0  PLT 167 164 168   Basic Metabolic Panel: Recent Labs  Lab 12/07/23 0800 12/08/23 0019  NA 139  --   K 4.1  --   CL 103  --   CO2 25  --   GLUCOSE 231*  --   BUN 16  --   CREATININE 0.87 0.84  CALCIUM  9.4  --    GFR: Estimated Creatinine Clearance: 78.8 mL/min (by C-G formula based on SCr of 0.84 mg/dL). Liver Function Tests: Recent Labs  Lab 12/07/23 0800  AST 22  ALT 23  ALKPHOS 74  BILITOT 0.5  PROT 6.9  ALBUMIN 4.1   No results for input(s): LIPASE, AMYLASE in the last 168 hours. No results for input(s): AMMONIA in the last 168 hours. Coagulation Profile: Recent Labs  Lab 12/07/23 0800  INR 0.9   Cardiac Enzymes: Recent Labs  Lab 12/07/23 0800  CKTOTAL 87   BNP (last 3 results) No results for input(s): PROBNP in the last 8760 hours. HbA1C: Recent Labs    12/08/23 0019  HGBA1C 8.4*   CBG: Recent Labs  Lab 12/07/23 0751 12/07/23 2236 12/08/23 0008 12/08/23 0359 12/08/23 0730  GLUCAP 221* 122* 126* 153* 140*   Lipid Profile: No results for input(s): CHOL, HDL, LDLCALC, TRIG, CHOLHDL, LDLDIRECT in the last 72 hours. Thyroid Function Tests: No results  for input(s): TSH, T4TOTAL, FREET4, T3FREE, THYROIDAB in the last 72 hours. Anemia Panel: No results for input(s): VITAMINB12, FOLATE, FERRITIN, TIBC, IRON, RETICCTPCT in the last 72 hours. Sepsis Labs: Recent Labs  Lab 12/07/23 0828 12/07/23 0955 12/08/23 0019 12/08/23 0148  PROCALCITON  --   --   --  <0.10  LATICACIDVEN 2.9* 2.7* 1.0  --     Recent Results (from the past 240 hours)  Culture, blood (routine x 2)     Status: None (Preliminary result)   Collection Time: 12/07/23  8:28 AM   Specimen: BLOOD  Result Value Ref Range Status   Specimen Description BLOOD BLOOD RIGHT ARM  Final   Special Requests   Final    BOTTLES DRAWN AEROBIC AND ANAEROBIC Blood Culture adequate volume   Culture   Final    NO GROWTH 1 DAY Performed at  [facility], [address], [place], [place] [address]    Report Status PENDING  Incomplete  Culture, blood (routine x 2)     Status: None (Preliminary result)   Collection Time: 12/07/23  8:28 AM   Specimen: BLOOD  Result Value Ref Range Status   Specimen Description BLOOD RIGHT ANTECUBITAL  Final   Special Requests   Final    BOTTLES DRAWN AEROBIC AND ANAEROBIC Blood Culture adequate volume   Culture   Final    NO GROWTH 1 DAY Performed at [facility], [address] [place], [place] [address]    Report Status PENDING  Incomplete         Radiology Studies: MR BRAIN W WO CONTRAST Result Date: 12/08/2023 CLINICAL DATA:  Neuro deficit, acute, stroke suspected EXAM: MRI HEAD WITHOUT AND WITH CONTRAST TECHNIQUE: Multiplanar, multiecho pulse sequences of the brain and surrounding structures were obtained without and with intravenous contrast. CONTRAST:  8mL GADAVIST  GADOBUTROL  1 MMOL/ML IV SOLN COMPARISON:  CTA head/neck 12/07/2023. MRI head 11/18/2022. FINDINGS: Brain: No acute infarction, hemorrhage, hydrocephalus, extra-axial collection or mass lesion. Remote left cerebellar, left parietal and right  occipital infarcts. Moderate patchy T2/FLAIR hyperintensities in the white matter, compatible with chronic microvascular ischemic disease. Similar tangle of vessels in the left temporal lobe, compatible with known arteriovenous malformation. Vascular: Normal flow voids. Skull and upper cervical spine: Normal marrow signal. Sinuses/Orbits: Negative. IMPRESSION: 1. No evidence of acute intracranial abnormality. 2. Similar left temporal arteriovenous malformation. 3. Remote infarcts and chronic microvascular ischemic disease. Electronically Signed   By: Gilmore GORMAN Molt M.D.   On: 12/08/2023 00:42   CT C-SPINE NO CHARGE Result Date: 12/07/2023 CLINICAL DATA:  Altered mental status. EXAM: CT CERVICAL SPINE WITHOUT CONTRAST TECHNIQUE: Multidetector CT imaging of the cervical spine was performed without intravenous contrast. Multiplanar CT image reconstructions were also generated. RADIATION DOSE REDUCTION: This exam was performed according to the departmental dose-optimization program which includes automated exposure control, adjustment of the mA and/or kV according to patient size and/or use of iterative reconstruction technique. COMPARISON:  CT of the cervical spine dated December 06, 2020. FINDINGS: Alignment: The cervical vertebrae maintain their height and alignment. Skull base and vertebrae: No evidence of fracture or osseous lesion. Soft tissues and spinal canal: No paraspinous hematoma or soft tissue swelling. Disc levels: There is mild posterior disc bulging and endplate ridging at C5-6 and C6-7, causing mild central spinal canal stenosis at both levels. There is mild-to-moderate left neural foraminal stenosis at C5-6 and C6-7. The other disc space levels are unremarkable. Upper chest: The visualized lung apices are clear. Other: None. IMPRESSION: 1. Mild chronic degenerative disc disease at C5-6 and C6-7. Electronically Signed   By: Evalene Coho M.D.   On: 12/07/2023 09:38   DG Chest 1 View Result  Date: 12/07/2023 CLINICAL DATA:  Altered mental status. Woke up this morning with left leg numbness and weakness. Dizziness. EXAM: CHEST  1 VIEW COMPARISON:  08/05/2023 FINDINGS: Lungs are hypoinflated without focal airspace consolidation or effusion. Cardiomediastinal silhouette is unremarkable. Enchondroma over the proximal left humerus. Degenerative changes of the spine. IMPRESSION: No active disease. Electronically Signed   By: Toribio Agreste M.D.   On: 12/07/2023 09:11   CT Angio Head Neck W WO CM Result Date: 12/07/2023 CLINICAL DATA:  Neuro deficit, acute, stroke suspected EXAM: CT ANGIOGRAPHY HEAD AND NECK WITH AND WITHOUT CONTRAST TECHNIQUE: Multidetector CT imaging of the head and neck was performed using the standard protocol during bolus  administration of intravenous contrast. Multiplanar CT image reconstructions and MIPs were obtained to evaluate the vascular anatomy. Carotid stenosis measurements (when applicable) are obtained utilizing NASCET criteria, using the distal internal carotid diameter as the denominator. RADIATION DOSE REDUCTION: This exam was performed according to the departmental dose-optimization program which includes automated exposure control, adjustment of the mA and/or kV according to patient size and/or use of iterative reconstruction technique. CONTRAST:  75mL OMNIPAQUE  IOHEXOL  350 MG/ML SOLN COMPARISON:  CT angiogram of the head and neck dated May 04, 2023. FINDINGS: CT HEAD FINDINGS Brain: Age-related atrophy and left parietal encephalomalacia are again demonstrated. There is moderate periventricular and deep cerebral white matter disease. No evidence of hemorrhage, mass, acute cortical infarct or hydrocephalus. Vascular: Moderate calcific atheromatous disease within the carotid siphons. Skull: Intact and unremarkable. Sinuses/Orbits: Clear paranasal sinuses. Status post bilateral lens replacement. Other: None. Review of the MIP images confirms the above findings CTA NECK  FINDINGS Aortic arch: Mild calcific plaque without aneurysm or stenosis. Standard branching. The brachiocephalic artery is widely patent. 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 Right carotid system: Mild calcific plaque within the right carotid bulb and origin of the internal carotid artery, with less than 10% luminal stenosis. Left carotid system: Mild calcific and noncalcific plaque within the proximal left internal carotid artery, with less than 10% luminal stenosis. Vertebral arteries: Mild calcific plaque within the V1 segments of the vertebral arteries without flow-limiting stenosis. The V2 segments are patent bilaterally. Skeleton: Mild degenerative changes within the cervical spine. Other neck: None. Upper chest: The lung apices are clear. Review of the MIP images confirms the above findings CTA HEAD FINDINGS Anterior circulation: There is mild calcific plaque within the communicating segments of the internal carotid arteries with no flow-limiting stenosis. The anterior and middle cerebral arteries and their branches appear normal in caliber. There is an arteriovenous malformation again demonstrated within the left temporal lobe supplied by left MCA branches, which are not significantly dilated. Posterior circulation: The vertebrobasilar system is unremarkable. The posterior cerebral arteries are normal in caliber. A left posterior communicating artery is present. None is clearly demonstrated on the right. The cerebellar arteries are patent. Venous sinuses: Patent. Anatomic variants: None. Review of the MIP images confirms the above findings IMPRESSION: 1. 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 2. 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 3. Mild atheromatous plaque within the proximal internal carotid arteries with less than 10% luminal stenosis bilaterally. 4. Chronic left parietal infarct and moderate cerebral white matter disease. Electronically Signed    By: Evalene Coho M.D.   On: 12/07/2023 09:06        LOS: 1 day   Time spent= 40 mins    Deliliah Room, MD [facility]  If 7PM-7AM, please contact night-coverage  12/08/2023, 10:17 AM

## 2023-12-08 NOTE — Consult Note (Signed)
 NEUROLOGY CONSULT NOTE   Date of service: December 08, 2023 Patient Name: Jon [name] MRN:  [id number] DOB:  1950/11/13 Chief Complaint: seizure-like activity Requesting Provider: Dr. Lorelle Cheadle  History of Present Illness  Jon [name] is a 73 y.o. male with hx of prior CVA with some left-sided weakness, known left temporal AVM, hypertension, hyperlipidemia, diabetes, prior subarachnoid hemorrhage, prior syncopal episodes who presented with decreased sensation and strength in his left lower extremity.    Jon [name]: In the ED, noted to have multiple staring spells with shaking of his left upper extremity, aborted 2x with IV Ativan .  Wife states that he has been altered and having staring spells since Sunday.  At the time he reported 2 episodes of syncope which were associated with increased left leg weakness but no motor seizure activity, no incontinence, no tongue biting.  He is not on antiepileptic medications at home.  He was loaded with 4.5 g of Keppra  in the ED.  He has not returned to baseline mental status.  He became increasingly agitated, pulling at lines, required olanzapine .  CT head and CTA head and neck showed no acute process.  MRI brain is negative.  On [facility] admission exam, patient is lethargic, only intermittently follows simple commands (opens eyes, moves arms). LTM EEG was connected this morning.    ROS   Unable to ascertain due to AMS  Past History   Past Medical History:  Diagnosis Date   AVM (arteriovenous malformation) brain    Diabetes mellitus without complication (HCC)    Hypertension    Pituitary tumor    TIA (transient ischemic attack)     Past Surgical History:  Procedure Laterality Date   COLONOSCOPY     EYE SURGERY     HERNIA REPAIR      Family History: Family History  Problem Relation Age of Onset   Diabetes Mellitus II Other        Maternal aunt    Social History  reports that he has never smoked. He has never used smokeless tobacco. He  reports that he does not drink alcohol and does not use drugs.  Allergies  Allergen Reactions   Beta Adrenergic Blockers Itching   Topiramate Nausea And Vomiting    Other reaction(s): Dizziness, Hallucination Other Reaction: violence   Wasp Venom Anaphylaxis   Aspirin  Other (See Comments)    Per pt's PCP due to spontaneous bleeding in his brain.   Benadryl  [Diphenhydramine ] Hives   Lisinopril Itching    Other reaction(s): Other (See Comments) Leg pain   Nabumetone Other (See Comments)    Reaction: unknown   Valproic  Acid Other (See Comments)    Reaction: Headache    Medications   Current Facility-Administered Medications:    0.9 %  sodium chloride  infusion, , Intravenous, Continuous, Alfornia Madison, MD, Last Rate: 125 mL/hr at 12/08/23 0022, New Bag at 12/08/23 0022   acetaminophen  (TYLENOL ) tablet 650 mg, 650 mg, Oral, Q6H PRN **OR** acetaminophen  (TYLENOL ) suppository 650 mg, 650 mg, Rectal, Q6H PRN, Alfornia Madison, MD   enoxaparin  (LOVENOX ) injection 40 mg, 40 mg, Subcutaneous, Q24H, Rathore, Vasundhra, MD, 40 mg at 12/08/23 0019   insulin  aspart (novoLOG ) injection 0-9 Units, 0-9 Units, Subcutaneous, Q4H, Rathore, Vasundhra, MD   levETIRAcetam  (KEPPRA ) undiluted injection 1,000 mg, 1,000 mg, Intravenous, Q12H, Alfornia Madison, MD, 1,000 mg at 12/08/23 0019  Vitals   There were no vitals filed for this visit.   There is no height or weight on file to  calculate BMI.   Physical Exam   Gen: patient lying in bed, obtunded CV: extremities appear well-perfused Resp: normal WOB  Neurologic exam MS: obtunded, only intermittently follows very simple commands Speech: only moans CN: PERRL, blinks to threat bilat, EOMI, sensation intact, L UMN facial droop, hearing intact to voice Motor: drift in RUE but not to bed, LUE drift to bed, wiggles toes bilat Sensory: withdraws to noxious stimuli less briskly on L than R Coordination: UTA Gait:  deferred   Labs/Imaging/Neurodiagnostic studies   CBC:  Recent Labs  Lab 2023/12/15 0019 2023-12-15 0148  WBC 4.3 5.6  HGB 13.4 13.2  HCT 38.0* 37.6*  MCV 89.6 91.0  PLT 164 168   Basic Metabolic Panel:  Lab Results  Component Value Date   NA 139 12/07/2023   K 4.1 12/07/2023   CO2 25 12/07/2023   GLUCOSE 231 (H) 12/07/2023   BUN 16 12/07/2023   CREATININE 0.84 12/15/2023   CALCIUM  9.4 12/07/2023   GFRNONAA >60 2023/12/15   GFRAA >60 05/14/2019   Lipid Panel:  Lab Results  Component Value Date   LDLCALC 150 (H) 11/19/2022   HgbA1c:  Lab Results  Component Value Date   HGBA1C 8.4 (H) 12-15-23   Urine Drug Screen:     Component Value Date/Time   LABOPIA NONE DETECTED 12/10/2022 2227   COCAINSCRNUR NONE DETECTED 12/10/2022 2227   LABBENZ NONE DETECTED 12/10/2022 2227   AMPHETMU NONE DETECTED 12/10/2022 2227   THCU NONE DETECTED 12/10/2022 2227   LABBARB NONE DETECTED 12/10/2022 2227    Alcohol Level     Component Value Date/Time   ETH <10 12/10/2022 2227   INR  Lab Results  Component Value Date   INR 0.9 12/07/2023   APTT  Lab Results  Component Value Date   APTT 31 11/18/2022    CTA H&N 1. 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 2. 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 3. Mild atheromatous plaque within the proximal internal carotid arteries with less than 10% luminal stenosis bilaterally. 4. Chronic left parietal infarct and moderate cerebral white matter disease.  7/30 PM [facility] EEG Review: No ongoing seizure   ASSESSMENT   Jon [name] is a 73 y.o. male with hx of prior CVA with some left-sided weakness, known left temporal AVM, hypertension, hyperlipidemia, diabetes, prior subarachnoid hemorrhage, prior syncopal episodes who presented with decreased sensation and strength in his left lower extremity and had witnessed seizure activity x2 in ED. He was transferred to [facility] for cEEG to r/o non-convulsive  status.  This morning, he is obtunded but awakens somewhat to voice and follows some very simple commands. Mental status is likely due to multiple seizure and agitation PRN medications given over the last 24 hours.   RECOMMENDATIONS   - Continue seizure precautions  - LTM EEG - Continue keppra  1000mg  bid  - Start Seroquel at bedtime?  ______________________________________________________________________   Pt seen by Neuro NP/APP and later by MD. Note/plan to be edited by MD as needed.    Rocky JAYSON Likes, DNP [facility] Please use AMION for contact information & EPIC for messaging.   NEUROHOSPITALIST ADDENDUM Performed a face to face diagnostic evaluation.   I have reviewed the contents of history and physical exam as documented by PA/ARNP/Resident and agree with above documentation.  I have discussed and formulated the above plan as documented. Edits to the note have been made as needed.  Salman Khaliqdina, MD [facility] [phone]   If 7pm  to 7am, please call on call as listed on AMION.

## 2023-12-08 NOTE — Evaluation (Signed)
 Clinical/Bedside Swallow Evaluation Patient Details  Name: Jon [name] MRN: [id number] Date of Birth: 31-Mar-1951  Today's Date: 12/08/2023 Time: SLP Start Time (ACUTE ONLY): 1240 SLP Stop Time (ACUTE ONLY): 1253 SLP Time Calculation (min) (ACUTE ONLY): 13 min  Past Medical History:  Past Medical History:  Diagnosis Date   AVM (arteriovenous malformation) brain    Diabetes mellitus without complication (HCC)    Hypertension    Pituitary tumor    TIA (transient ischemic attack)    Past Surgical History:  Past Surgical History:  Procedure Laterality Date   COLONOSCOPY     EYE SURGERY     HERNIA REPAIR     HPI:  Jon [name] is a 73 yo male presenting to [facility] ED 7/30 with decreased sensation and strength in his LLE. While in the ED, pt had witnessed seizure activity x2. Transferred to [facility] for LTM EEG and MRI, which was negative. 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 No s/s of dysphagia or aspiration were observed. Education was provided regarding holding POs when pt is not alert but otherwise, resume regular diet with thin liquids. Ongoing SLP f/u is not clinically indicated. Discussed wtih RN and will sign off. SLP Visit Diagnosis: Dysphagia, unspecified (R13.10)    Aspiration Risk  Mild aspiration risk    Diet Recommendation Regular;Thin liquid    Liquid Administration via: Cup;Straw Medication Administration: Whole meds with liquid Supervision: Patient able to self feed Compensations: Minimize environmental distractions;Slow rate;Small sips/bites Postural Changes: Seated upright at 90 degrees;Remain upright for at least 30 minutes after po  intake    Other  Recommendations Oral Care Recommendations: Oral care BID     Assistance Recommended at Discharge    Functional Status Assessment Patient has not had a recent decline in their functional status  Frequency and Duration            Prognosis Prognosis for improved oropharyngeal function: Good Barriers to Reach Goals: Time post onset      Swallow Study   General HPI: Jon [name] is a 73 yo male presenting to [facility] ED 7/30 with decreased sensation and strength in his LLE. While in the ED, pt had witnessed seizure activity x2. Transferred to [facility] for LTM EEG and MRI, which was negative. 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, GERD, nontraumatic ICH Type of Study: Bedside Swallow Evaluation Previous Swallow Assessment: none in chart Diet Prior to this Study: NPO Temperature Spikes Noted: No Respiratory Status: Room air History of Recent Intubation: No Behavior/Cognition: Alert;Cooperative Oral Cavity Assessment: Within Functional Limits Oral Care Completed by SLP: No Oral Cavity - Dentition: Adequate natural dentition Vision: Functional for self-feeding Self-Feeding Abilities: Able to feed self Patient Positioning: Upright in bed Baseline Vocal Quality: Normal Volitional Cough: Strong Volitional Swallow: Able to elicit    Oral/Motor/Sensory Function Overall Oral Motor/Sensory Function: Within functional limits   Ice Chips Ice chips: Not tested   Thin Liquid Thin Liquid: Within functional limits Presentation: Straw;Self Fed    Nectar Thick Nectar Thick Liquid: Not tested   Honey Thick Honey Thick Liquid: Not  tested   Puree Puree: Within functional limits Presentation: Self Fed;Spoon   Solid     Solid: Within functional limits Presentation: Self Fed;Spoon      Damien Blumenthal, M.A., CCC-SLP Speech Language Pathology, Acute  Rehabilitation Services  Secure Chat preferred [phone]  12/08/2023,1:11 PM

## 2023-12-08 NOTE — Evaluation (Signed)
 Physical Therapy Evaluation Patient Details Name: Jon [name] MRN: [id number] DOB: Nov 21, 1950 Today's Date: 12/08/2023  History of Present Illness  Pt is a 73 y.o. male who presented 12/07/23 with decreased sensation and strength in his L leg and while in the ED he had witnessed seizure-like activity x 2. MRI showed no evidence of acute intracranial abnormality. Pt being worked up for potential seizures. 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 PTA, he was mod I holding onto furniture as needed to mobilize in the home and intermittently utilizing a SPC when out in public in the community. He lives with his wife. They are currently living in a camper with 3 STE until their house is done being renovated, which should be soon. Their house is 1-level with 5 STE. Currently, the pt is demonstrating deficits in cognition, balance, coordination, endurance, and L leg strength and sensation. He has some mild residual L-sided deficits from his prior CVA, but his symptoms are worse today than usual. He also reports a hx of dizziness for a while now, and believes it is vertigo. He did have spontaneous LOB when sitting EOB due to dizziness today. He went to an ENT, who did not report any findings to explain the dizziness at the time, per pt and wife. No nystagus was noted with head impulse test bil or smooth pursuits today, but the pt may benefit from a vestibular assessment at a later date when he is less lethargic. Currently, he is requiring up to [facility] for bed mobility, transfers, and ambulating up to ~5 ft with bil HHA. His L knee buckles frequently when standing, placing him  at high risk for falls and injuries. As the pt has had a drastic functional decline and is motivated to be independent, he could greatly benefit from intensive inpatient rehab, > 3 hours/day. Will continue to follow acutely and update d/c recs as necessary as pt progresses.       Orthostatics -  121/71 (86) & 79 bpm supine 142/86 (104) & 91 bpm sitting 139/85 (102) & 97 bpm standing    If plan is discharge home, recommend the following: Two people to help with walking and/or transfers;A lot of help with bathing/dressing/bathroom;Assistance with cooking/housework;Direct supervision/assist for medications management;Direct supervision/assist for financial management;Assist for transportation;Help with stairs or ramp for entrance;Supervision due to cognitive status   Can travel by private vehicle        Equipment Recommendations Rolling walker (2 wheels);BSC/3in1;Wheelchair (measurements PT);Wheelchair cushion (measurements PT) (pending progress)  Recommendations for Other Services  Rehab consult;Speech consult (SLP for cognitive assessment)    Functional Status Assessment Patient has had a recent decline in their functional status and demonstrates the ability to make significant improvements in function in a reasonable and predictable amount of time.     Precautions / Restrictions Precautions Precautions: Fall;Other (comment) Recall of Precautions/Restrictions: Impaired Precaution/Restrictions Comments: EEG; hx of dizziness (vertigo?) Restrictions Weight Bearing Restrictions Per Provider Order: No      Mobility  Bed Mobility Overal bed mobility: Needs Assistance Bed Mobility: Supine to Sit, Sit to Supine     Supine to sit: Contact guard, HOB elevated, Used rails Sit to supine: Mod assist   General bed mobility comments: Cues provided  to bring legs off EOB and push up to sit up R EOB, extra time needed to manage trunk, CGA for safety due to noted truncal sway. ModA needed to  lift legs back onto bed with return to supine.    Transfers Overall transfer level: Needs assistance Equipment used: 1 person hand held assist Transfers: Sit to/from Stand Sit to Stand: Mod assist           General transfer comment: Pt required modA to power up to stand and gain balance from sitting EOB.    Ambulation/Gait Ambulation/Gait assistance: Mod assist Gait Distance (Feet): 5 Feet Assistive device: 1 person hand held assist Gait Pattern/deviations: Step-to pattern, Decreased step length - right, Decreased step length - left, Decreased stride length, Decreased weight shift to left, Knees buckling, Trunk flexed, Narrow base of support, Shuffle Gait velocity: reduced Gait velocity interpretation: <1.31 ft/sec, indicative of household ambulator   General Gait Details: Pt takes slow, small, shuffling, incoordinated steps, intermittently placing L foot proximally to his R. Noted fairly frequent L knee buckling during L stance phase, with pt often reducing L stance phase as a result. ModA needed for balance and L knee blocking during L stance phase. VCs provided for longer steps and tactile cues provided for weight shifting. Distance limited by pt fatigue and dizziness at this time  Stairs            Wheelchair Mobility     Tilt Bed    Modified Rankin (Stroke Patients Only) Modified Rankin (Stroke Patients Only) Pre-Morbid Rankin Score: Slight disability Modified Rankin: Moderately severe disability     Balance Overall balance assessment: Needs assistance Sitting-balance support: Bilateral upper extremity supported, Feet supported Sitting balance-Leahy Scale: Poor Sitting balance - Comments: intermittent LOB and trunk sway when pt would get dizzy, varying in needing CGA with intermittent modA to recover during these LOB bouts   Standing balance support: Bilateral upper extremity supported, During functional activity Standing balance-Leahy Scale: Poor Standing  balance comment: reliant on UE support and modA to maintain balance, L knee buckling noted                             Pertinent Vitals/Pain Pain Assessment Pain Assessment: Faces Faces Pain Scale: Hurts a little bit Pain Location: generalized Pain Descriptors / Indicators: Discomfort Pain Intervention(s): Limited activity within patient's tolerance, Monitored during session, Repositioned    Home Living Family/patient expects to be discharged to:: Private residence Living Arrangements: Spouse/significant other Available Help at Discharge: Family Type of Home: Other(Comment) (camper until house is done being renovated, has been in camper for past year but should be able to move into house again soon) Home Access: Stairs to enter Entrance Stairs-Rails: Left Entrance Stairs-Number of Steps: 3   Home Layout: One level Home Equipment: Rollator (4 wheels);Standard Walker;Cane - quad;Cane - single point;Shower seat Additional Comments: House is currently being renovated, will soon be ready to move back in, it is a 1-level house with 5 STE with bil handrails in reach, walk-in shower, and handicap height toilet    Prior Function Prior Level of Function : Independent/Modified Independent;Driving             Mobility Comments: Holds onto furniture in home, uses [facility] only when in public out in community; hx of multiple recent falls ADLs Comments: manages his own meds and finances, cleans, cooks, bathes and dresses independently     Extremity/Trunk Assessment   Upper Extremity  Assessment Upper Extremity Assessment: Defer to OT evaluation    Lower Extremity Assessment Lower Extremity Assessment: LLE deficits/detail LLE Deficits / Details: prior CVA resulted in mild residual L weakness, but weaker now than usual with L knee buckling when standing; MMT scores of 4- knee extension, 3 ankle dorsiflexion, 4- hip flexion; reports diminished sensation throughout L leg compared to R,  worse now than usual LLE Sensation: decreased light touch LLE Coordination: decreased gross motor;decreased fine motor       Communication   Communication Communication: No apparent difficulties    Cognition Arousal: Alert, Lethargic Behavior During Therapy: Flat affect   PT - Cognitive impairments: Difficult to assess, Attention, Sequencing, Memory, Problem solving Difficult to assess due to: Level of arousal                     PT - Cognition Comments: Pt lethargic, but able to arouse to stimulation. Pt forgetting cues/plan for session and needing cues repeated intermittently. Delayed processing noted. Following commands: Impaired Following commands impaired: Follows one step commands with increased time     Cueing Cueing Techniques: Verbal cues, Tactile cues     General Comments General comments (skin integrity, edema, etc.): Orthostatics - 121/71 (86) & 79 bpm supine, 142/86 (104) & 91 bpm sitting, 139/85 (102) & 97 bpm standing; no nystagus noted with head impulse test bil or smooth pursuits, but pt may benefit from vestibular assessment at a later date when less lethargic    Exercises     Assessment/Plan    PT Assessment Patient needs continued PT services  PT Problem List Decreased strength;Decreased balance;Decreased activity tolerance;Decreased mobility;Decreased coordination;Decreased cognition;Decreased knowledge of use of DME;Decreased safety awareness;Impaired sensation       PT Treatment Interventions Gait training;DME instruction;Functional mobility training;Therapeutic activities;Therapeutic exercise;Balance training;Neuromuscular re-education;Cognitive remediation;Patient/family education;Stair training    PT Goals (Current goals can be found in the Care Plan section)  Acute Rehab PT Goals Patient Stated Goal: to improve and go home soon PT Goal Formulation: With patient/family Time For Goal Achievement: 12/22/23 Potential to Achieve Goals:  Good    Frequency Min 3X/week     Co-evaluation               AM-PAC PT 6 Clicks Mobility  Outcome Measure Help needed turning from your back to your side while in a flat bed without using bedrails?: A Little Help needed moving from lying on your back to sitting on the side of a flat bed without using bedrails?: A Little Help needed moving to and from a bed to a chair (including a wheelchair)?: A Lot Help needed standing up from a chair using your arms (e.g., wheelchair or bedside chair)?: A Lot Help needed to walk in hospital room?: Total (<20 ft) Help needed climbing 3-5 steps with a railing? : Total 6 Click Score: 12    End of Session Equipment Utilized During Treatment: Gait belt Activity Tolerance: Patient tolerated treatment well;Patient limited by lethargy;Other (comment) (limited by dizziness) Patient left: in bed;with call bell/phone within reach;with bed alarm set;with family/visitor present Nurse Communication: Mobility status;Other (comment);Need for lift equipment (orthostatics negative; stedy for extra safety with transfers) PT Visit Diagnosis: Unsteadiness on feet (R26.81);Other abnormalities of gait and mobility (R26.89);Muscle weakness (generalized) (M62.81);Difficulty in walking, not elsewhere classified (R26.2);Other symptoms and signs involving the nervous system (R29.898);Dizziness and giddiness (R42)    Time: 8365-8296 PT Time Calculation (min) (ACUTE ONLY): 29 min   Charges:   PT Evaluation $PT Eval Moderate  Complexity: 1 Mod PT Treatments $Therapeutic Activity: 8-22 mins PT General Charges $$ ACUTE PT VISIT: 1 Visit         Theo Ferretti, PT, DPT Acute Rehabilitation Services  Office: [phone]   Theo CHRISTELLA Ferretti 12/08/2023, 5:28 PM

## 2023-12-08 NOTE — Plan of Care (Signed)

## 2023-12-08 NOTE — Progress Notes (Signed)
 Transition of Care [facility]) - Inpatient Brief Assessment   Patient Details  Name: Jon [name] MRN: [id number] Date of Birth: [birth year]  Transition of Care [facility]) CM/SW Contact:    Rosaline [name], RN Phone Number: 12/08/2023, 3:25 PM   Clinical Narrative: Patient admitted to seizures and confusion.  Patient's wife states that patient was transferred from [facility] to have neurology consult and follow up.  Patient's wife states that patient received Ativan  at the hospital - and she states that Ativan  always makes him more confused and gets combative with this medication.  Patient remains confused at the bedside at this time.  No DME at the patient's home at this time.  No IP Care management needs at this time.  I requested MD place PT/OT consult for patient when appropriate.   Transition of Care Asessment: Insurance and Status: (P) Insurance coverage has been reviewed Patient has primary care physician: (P) Yes Home environment has been reviewed: (P) from home with spouse Prior level of function:: (P) Independent - wife states that patient confused a little at time Prior/Current Home Services: (P) No current home services Social Drivers of Health Review: (P) SDOH reviewed needs interventions Readmission risk has been reviewed: (P) Yes Transition of care needs: (P) transition of care needs identified, TOC will continue to follow

## 2023-12-09 ENCOUNTER — Inpatient Hospital Stay (HOSPITAL_COMMUNITY): Admission: RE | Admit: 2023-12-09 | Source: Ambulatory Visit

## 2023-12-09 DIAGNOSIS — R569 Unspecified convulsions: Secondary | ICD-10-CM | POA: Diagnosis not present

## 2023-12-09 MED ORDER — HALOPERIDOL LACTATE 5 MG/ML IJ SOLN: 2.0000 mg | Freq: Once | INTRAMUSCULAR | Status: DC

## 2023-12-09 MED ORDER — HALOPERIDOL LACTATE 5 MG/ML IJ SOLN
2.0000 mg | Freq: Once | INTRAMUSCULAR | Status: DC
  Filled 2023-12-09: qty 1

## 2023-12-09 MED ORDER — LEVETIRACETAM 500 MG PO TABS: 1000.0000 mg | ORAL_TABLET | Freq: Two times a day (BID) | ORAL | 0 refills | Status: AC

## 2023-12-09 NOTE — Discharge Summary (Signed)
 Physician Discharge Summary   Patient: Jon [name] MRN: [id number] DOB: 20-Jul-1950  Admit date:     12/07/2023  Discharge date: 12/09/23  Discharge Physician: Deliliah Room   PCP: Auston Reyes BIRCH, MD   Recommendations at discharge:    Follow up with your PCP in one week. Follow up out patient with neurology in 1-2 weeks Continue taking meds as prescribed  Discharge Diagnoses: Principal Problem:   Seizures (HCC) Active Problems:   HTN (hypertension)   Hyperlipidemia, mixed   Type 2 diabetes mellitus (HCC)   Lactic acidosis   Hospital Course:  73 y.o. 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   Symptoms improved after he was given IV Ativan .  Family reported patient having altered mental status and staring spells over the past few days.  He was loaded with IV Keppra  4.5 g in the ED.He became increasingly agitated and required Zyprexa .  CTA head and neck showed no acute process.  CT C-spine negative for acute findings.  Neurology recommended brain MRI and transferred to [facility] for cEEG to rule out non-convulsive status.  Patient was alert and awake, oriented on the morning of 8/1. He wanted to go home. His wife came to the bedside and confirmed that he is at his baseline mentation now. He was sent home on oral keppra  1000 mg BID and was advised to follow up out patient with neurology.         Consultants: Neurology Procedures performed: Continuous EEG  Disposition: Home Diet recommendation:  Cardiac and  Carb modified diet DISCHARGE MEDICATION: Allergies as of 12/09/2023       Reactions   Beta Adrenergic Blockers Itching   Topiramate Nausea And Vomiting   Other reaction(s): Dizziness, Hallucination Other Reaction: violence   Wasp Venom Anaphylaxis   Aspirin  Other (See Comments)   Per pt's PCP due to spontaneous bleeding in his brain.   Ativan  [lorazepam ] Other (See Comments)   Agiation & lethargy   Benadryl  [diphenhydramine ] Hives   Lisinopril Itching   Other reaction(s): Other (See Comments) Leg pain   Nabumetone Other (See Comments)   Reaction: unknown   Valproic  Acid Other (See Comments)   Reaction: Headache        Medication List     TAKE these medications    acetaminophen  500 MG tablet Commonly known as: TYLENOL  Take 500-1,000 mg by mouth every 6 (six) hours as needed for moderate pain (pain score 4-6).   albuterol  108 (90 Base) MCG/ACT inhaler Commonly known as: VENTOLIN  HFA Inhale 2 puffs into the lungs every 6 (six) hours as needed for wheezing or shortness of breath.   amLODipine  5 MG tablet Commonly known as: NORVASC  Take 5 mg by mouth daily.   aspirin  EC 81 MG tablet Take 1 tablet (81 mg total) by mouth daily. Swallow whole. What changed:  when to take this reasons to take this additional instructions   atorvastatin  80 MG tablet Commonly known as: LIPITOR Take 1 tablet (80 mg total) by mouth daily.   butalbital -acetaminophen -caffeine  50-325-40 MG tablet Commonly known as: FIORICET  Take 2 tablets by mouth every 6 (six) hours  as needed for headache or migraine.   cyanocobalamin  1000 MCG/ML injection Commonly known as: VITAMIN B12 Inject 1,000 mcg into the muscle every 30 (thirty) days.   DULoxetine  30 MG capsule Commonly known as: CYMBALTA  Take 30 mg by mouth daily.   EPINEPHrine  0.3 mg/0.3 mL Soaj injection Commonly known as: EPI-PEN Inject 0.3 mg into the muscle as needed for anaphylaxis.   glipiZIDE 10 MG 24 hr tablet Commonly known as:  GLUCOTROL XL Take 10 mg by mouth daily.   levETIRAcetam  500 MG tablet Commonly known as: Keppra  Take 2 tablets (1,000 mg total) by mouth 2 (two) times daily.   naproxen  sodium 220 MG tablet Commonly known as: ALEVE  Take 220-440 mg by mouth 2 (two) times daily as needed (Pain).   oxyCODONE-acetaminophen  5-325 MG tablet Commonly known as: PERCOCET/ROXICET Take 0.5 tablets by mouth every 4 (four) hours as needed for severe pain (pain score 7-10).   testosterone  cypionate 200 MG/ML injection Commonly known as: DEPOTESTOSTERONE CYPIONATE Inject 200 mg into the muscle every 28 (twenty-eight) days.   traZODone  100 MG tablet Commonly known as: DESYREL  Take 100 mg by mouth at bedtime as needed for sleep.        Follow-up Information     Auston Reyes BIRCH, MD. Call in 1 week(s).   Specialty: Internal Medicine Contact information: [address] [facility] [place] [address] [phone]         [facility]. Call in 2 week(s).   Contact information: [address] [place], [address] [place]  [address] [phone]               Discharge Exam: There were no vitals filed for this visit. Constitutional: NAD, calm, comfortable Eyes: PERRL, lids and conjunctivae normal ENMT: Mucous membranes are moist. Posterior pharynx clear of any exudate or lesions.Normal dentition.  Neck: normal, supple, no masses, no thyromegaly Respiratory: clear to auscultation bilaterally, no wheezing, no crackles. Normal respiratory effort. No accessory muscle use.  Cardiovascular: Regular rate and rhythm, no murmurs / rubs / gallops. No extremity edema. 2+ pedal pulses. No carotid bruits.  Abdomen: no tenderness, no masses palpated. No hepatosplenomegaly. Bowel sounds positive.  Musculoskeletal: no clubbing / cyanosis. No joint deformity upper and lower extremities. Good ROM, no contractures. Normal muscle tone.  Skin: no rashes, lesions, ulcers. No  induration Neurologic: CN 2-12 grossly intact. Sensation intact, DTR normal. Strength 5/5 x all 4 extremities.  Psychiatric: Normal judgment and insight. Alert and oriented x 3. Normal mood.    Condition at discharge: good  The results of significant diagnostics from this hospitalization (including imaging, microbiology, ancillary and laboratory) are listed below for reference.   Imaging Studies: MR BRAIN W WO CONTRAST Result Date: 12/08/2023 CLINICAL DATA:  Neuro deficit, acute, stroke suspected EXAM: MRI HEAD WITHOUT AND WITH CONTRAST TECHNIQUE: Multiplanar, multiecho pulse sequences of the brain and surrounding structures were obtained without and with intravenous contrast. CONTRAST:  8mL GADAVIST  GADOBUTROL  1 MMOL/ML IV SOLN COMPARISON:  CTA head/neck 12/07/2023. MRI head 11/18/2022. FINDINGS: Brain: No acute infarction, hemorrhage, hydrocephalus, extra-axial collection or mass lesion. Remote left cerebellar, left parietal and right occipital infarcts. Moderate patchy T2/FLAIR hyperintensities in the white matter, compatible with chronic microvascular ischemic disease. Similar tangle of vessels in the left temporal lobe, compatible with known arteriovenous malformation. Vascular: Normal flow voids. Skull and upper cervical spine: Normal marrow signal. Sinuses/Orbits: Negative. IMPRESSION: 1. No evidence of acute intracranial abnormality. 2. Similar left temporal arteriovenous malformation. 3. Remote infarcts and  chronic microvascular ischemic disease. Electronically Signed   By: Gilmore GORMAN Molt M.D.   On: 12/08/2023 00:42   CT C-SPINE NO CHARGE Result Date: 12/07/2023 CLINICAL DATA:  Altered mental status. EXAM: CT CERVICAL SPINE WITHOUT CONTRAST TECHNIQUE: Multidetector CT imaging of the cervical spine was performed without intravenous contrast. Multiplanar CT image reconstructions were also generated. RADIATION DOSE REDUCTION: This exam was performed according to the departmental  dose-optimization program which includes automated exposure control, adjustment of the mA and/or kV according to patient size and/or use of iterative reconstruction technique. COMPARISON:  CT of the cervical spine dated December 06, 2020. FINDINGS: Alignment: The cervical vertebrae maintain their height and alignment. Skull base and vertebrae: No evidence of fracture or osseous lesion. Soft tissues and spinal canal: No paraspinous hematoma or soft tissue swelling. Disc levels: There is mild posterior disc bulging and endplate ridging at C5-6 and C6-7, causing mild central spinal canal stenosis at both levels. There is mild-to-moderate left neural foraminal stenosis at C5-6 and C6-7. The other disc space levels are unremarkable. Upper chest: The visualized lung apices are clear. Other: None. IMPRESSION: 1. Mild chronic degenerative disc disease at C5-6 and C6-7. Electronically Signed   By: Evalene Coho M.D.   On: 12/07/2023 09:38   DG Chest 1 View Result Date: 12/07/2023 CLINICAL DATA:  Altered mental status. Woke up this morning with left leg numbness and weakness. Dizziness. EXAM: CHEST  1 VIEW COMPARISON:  08/05/2023 FINDINGS: Lungs are hypoinflated without focal airspace consolidation or effusion. Cardiomediastinal silhouette is unremarkable. Enchondroma over the proximal left humerus. Degenerative changes of the spine. IMPRESSION: No active disease. Electronically Signed   By: Toribio Agreste M.D.   On: 12/07/2023 09:11   CT Angio Head Neck W WO CM Result Date: 12/07/2023 CLINICAL DATA:  Neuro deficit, acute, stroke suspected EXAM: CT ANGIOGRAPHY HEAD AND NECK WITH AND WITHOUT CONTRAST TECHNIQUE: Multidetector CT imaging of the head and neck was performed using the standard protocol during bolus administration of intravenous contrast. Multiplanar CT image reconstructions and MIPs were obtained to evaluate the vascular anatomy. Carotid stenosis measurements (when applicable) are obtained utilizing NASCET  criteria, using the distal internal carotid diameter as the denominator. RADIATION DOSE REDUCTION: This exam was performed according to the departmental dose-optimization program which includes automated exposure control, adjustment of the mA and/or kV according to patient size and/or use of iterative reconstruction technique. CONTRAST:  75mL OMNIPAQUE  IOHEXOL  350 MG/ML SOLN COMPARISON:  CT angiogram of the head and neck dated May 04, 2023. FINDINGS: CT HEAD FINDINGS Brain: Age-related atrophy and left parietal encephalomalacia are again demonstrated. There is moderate periventricular and deep cerebral white matter disease. No evidence of hemorrhage, mass, acute cortical infarct or hydrocephalus. Vascular: Moderate calcific atheromatous disease within the carotid siphons. Skull: Intact and unremarkable. Sinuses/Orbits: Clear paranasal sinuses. Status post bilateral lens replacement. Other: None. Review of the MIP images confirms the above findings CTA NECK FINDINGS Aortic arch: Mild calcific plaque without aneurysm or stenosis. Standard branching. The brachiocephalic artery is widely patent. 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 Right carotid system: Mild calcific plaque within the right carotid bulb and origin of the internal carotid artery, with less than 10% luminal stenosis. Left carotid system: Mild calcific and noncalcific plaque within the proximal left internal carotid artery, with less than 10% luminal stenosis. Vertebral arteries: Mild calcific plaque within the V1 segments of the vertebral arteries without flow-limiting stenosis. The V2 segments are patent bilaterally. Skeleton: Mild degenerative changes within the cervical  spine. Other neck: None. Upper chest: The lung apices are clear. Review of the MIP images confirms the above findings CTA HEAD FINDINGS Anterior circulation: There is mild calcific plaque within the communicating segments of the internal carotid arteries with no  flow-limiting stenosis. The anterior and middle cerebral arteries and their branches appear normal in caliber. There is an arteriovenous malformation again demonstrated within the left temporal lobe supplied by left MCA branches, which are not significantly dilated. Posterior circulation: The vertebrobasilar system is unremarkable. The posterior cerebral arteries are normal in caliber. A left posterior communicating artery is present. None is clearly demonstrated on the right. The cerebellar arteries are patent. Venous sinuses: Patent. Anatomic variants: None. Review of the MIP images confirms the above findings IMPRESSION: 1. 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 2. 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 3. Mild atheromatous plaque within the proximal internal carotid arteries with less than 10% luminal stenosis bilaterally. 4. Chronic left parietal infarct and moderate cerebral white matter disease. Electronically Signed   By: Evalene Coho M.D.   On: 12/07/2023 09:06    Microbiology: Results for orders placed or performed during the hospital encounter of 12/07/23  C Difficile Quick Screen w PCR reflex     Status: None   Collection Time: 12/08/23  3:51 PM   Specimen: STOOL  Result Value Ref Range Status   C Diff antigen NEGATIVE NEGATIVE Final   C Diff toxin NEGATIVE NEGATIVE Final   C Diff interpretation No C. difficile detected.  Final    Comment: Performed at [facility], [address], [place], [place] [address]    Labs: CBC: Recent Labs  Lab 12/07/23 0800 12/08/23 0019 12/08/23 0148  WBC 3.9* 4.3 5.6  HGB 14.1 13.4 13.2  HCT 40.3 38.0* 37.6*  MCV 91.0 89.6 91.0  PLT 167 164 168   Basic Metabolic Panel: Recent Labs  Lab 12/07/23 0800 12/08/23 0019  NA 139  --   K 4.1  --   CL 103  --   CO2 25  --   GLUCOSE 231*  --   BUN 16  --   CREATININE 0.87 0.84  CALCIUM  9.4  --    Liver Function Tests: Recent Labs  Lab  12/07/23 0800  AST 22  ALT 23  ALKPHOS 74  BILITOT 0.5  PROT 6.9  ALBUMIN 4.1   CBG: Recent Labs  Lab 12/08/23 0730 12/08/23 1148 12/08/23 1601 12/08/23 1949 12/08/23 2348  GLUCAP 140* 137* 233* 201* 157*    Discharge time spent: 44 minutes.  Signed: Deliliah Room, MD [facility] 12/09/2023

## 2023-12-09 NOTE — Progress Notes (Signed)
 Brief Neuro Update:  Patient was discharged prior to neurology evaluation today.  Continues EEG report with a spell of agitation but no seizure-like activity captured.  The pushbutton event for agitation did not have any EEG correlate.  The decision to discharge the patient was between the hospitalist and the patient and his wife.  Neurology was told that the patient wants to go home but was not notified of pending discharge. Since the patient is discharged, we do not have any further recommendations at this time.  I would have preferred to keep him here to capture an episode of staring off/syncope with left leg weakness.  Unfortunately, we did not get a chance to discuss this with patient and his wife at the bedside.  Xian Apostol [facility]

## 2023-12-09 NOTE — Plan of Care (Signed)
  Problem: Education: Goal: Knowledge of General Education information will improve Description: Including pain rating scale, medication(s)/side effects and non-pharmacologic comfort measures Outcome: Progressing   Problem: Health Behavior/Discharge Planning: Goal: Ability to manage health-related needs will improve Outcome: Progressing   Problem: Clinical Measurements: Goal: Ability to maintain clinical measurements within normal limits will improve Outcome: Progressing Goal: Will remain free from infection Outcome: Progressing Goal: Diagnostic test results will improve Outcome: Progressing Goal: Respiratory complications will improve Outcome: Progressing Goal: Cardiovascular complication will be avoided Outcome: Progressing   Problem: Activity: Goal: Risk for activity intolerance will decrease Outcome: Progressing   Problem: Nutrition: Goal: Adequate nutrition will be maintained Outcome: Progressing   Problem: Coping: Goal: Level of anxiety will decrease Outcome: Progressing   Problem: Elimination: Goal: Will not experience complications related to bowel motility Outcome: Progressing Goal: Will not experience complications related to urinary retention Outcome: Progressing   Problem: Pain Managment: Goal: General experience of comfort will improve and/or be controlled Outcome: Progressing   Problem: Safety: Goal: Ability to remain free from injury will improve Outcome: Progressing   Problem: Skin Integrity: Goal: Risk for impaired skin integrity will decrease Outcome: Progressing   Problem: Safety: Goal: Non-violent Restraint(s) Outcome: Progressing   Problem: Education: Goal: Ability to describe self-care measures that may prevent or decrease complications (Diabetes Survival Skills Education) will improve Outcome: Progressing Goal: Individualized Educational Video(s) Outcome: Progressing   Problem: Coping: Goal: Ability to adjust to condition or change  in health will improve Outcome: Progressing   Problem: Fluid Volume: Goal: Ability to maintain a balanced intake and output will improve Outcome: Progressing   Problem: Health Behavior/Discharge Planning: Goal: Ability to identify and utilize available resources and services will improve Outcome: Progressing Goal: Ability to manage health-related needs will improve Outcome: Progressing   Problem: Metabolic: Goal: Ability to maintain appropriate glucose levels will improve Outcome: Progressing   Problem: Nutritional: Goal: Maintenance of adequate nutrition will improve Outcome: Progressing Goal: Progress toward achieving an optimal weight will improve Outcome: Progressing   Problem: Skin Integrity: Goal: Risk for impaired skin integrity will decrease Outcome: Progressing   Problem: Tissue Perfusion: Goal: Adequacy of tissue perfusion will improve Outcome: Progressing

## 2023-12-09 NOTE — Procedures (Signed)
 Continuous Video-EEG Monitoring Report  CPT/Type of Study: 95720 (EEG with video, 12-26 hours)       Indications for Procedure: Seizure-like activity Primary neurological diagnosis: R56.9  Duration of study: 12/08/2023 08:48 to 12/09/2023 09:14  History: This is a 73 year old male with left temporal arteriovenous malformation presenting with seizure-like activity. Continuous video-EEG monitoring was performed to evaluate for seizure.   Technical Description: The EEG was performed using standard setting per the guidelines of American Clinical Neurophysiology Society (ACNS). A minimum of 21 electrodes were placed on scalp according to the International 10-20 or/and 10-10 Systems. Supplemental electrodes were placed as needed. Single EKG electrode was also used to detect cardiac arrhythmia. Patient's behavior was continuously recorded on video simultaneously with EEG. A minimum of 16 channels were used for data display. Each epoch of study was reviewed manually daily and as needed using standard referential and bipolar montages. Computerized quantitative EEG analysis (such as compressed spectral array analysis, trending, automated spike & seizure detection) were used as indicated.   EEG Description:  Epoch: 12/08/2023 08:48 to 12/09/2023 09:14  Background: During wakefulness, there was a bilateral reactive ~9 Hz posterior dominant rhythm. During drowsiness, slow roving eye movement, vertex waves and generalized slowing were seen. K complexes and spindles were present in stage II sleep.   PERIODIC OR RHYTHMIC PATTERNS: None.  EPILEPTIFORM DISCHARGES: None.  SEIZURES: None.  EVENTS: Event button was pushed at 09:04 and 09:26 on 12/08/2023 for agitation, which did not have EEG correlate.   EKG: No significant arrhythmia.   NOTE: EEG was degraded by excessive artifacts after 05:56 on 12/09/2023 and due to patient's interference with the electrodes.   Impression:  This is a normal awake and  asleep EEG.

## 2023-12-09 NOTE — Significant Event (Signed)
 Patient was agitated confused and trying to leave the hospital.  Patient is not being able to be redirected.  Reviewed patient's notes labs medications.  Discussed with neurologist Dr. Michaela.  Okay to give Haldol IV which I ordered.  Will discuss with oncoming hospitalist.  Redia Cleaver.

## 2023-12-09 NOTE — ED Notes (Signed)
 Dr Franky came down and filled out IVC paperwork for pt. It is now in Process.

## 2023-12-15 LAB — CULTURE, BLOOD (ROUTINE X 2)
Culture: NO GROWTH
Culture: NO GROWTH
Special Requests: ADEQUATE
Special Requests: ADEQUATE

## 2023-12-16 DIAGNOSIS — Z79899 Other long term (current) drug therapy: Secondary | ICD-10-CM | POA: Diagnosis not present

## 2023-12-16 DIAGNOSIS — G40909 Epilepsy, unspecified, not intractable, without status epilepticus: Secondary | ICD-10-CM | POA: Diagnosis not present

## 2023-12-16 DIAGNOSIS — E118 Type 2 diabetes mellitus with unspecified complications: Secondary | ICD-10-CM | POA: Diagnosis not present

## 2024-01-04 DIAGNOSIS — G5601 Carpal tunnel syndrome, right upper limb: Secondary | ICD-10-CM | POA: Diagnosis not present

## 2024-01-04 DIAGNOSIS — M19041 Primary osteoarthritis, right hand: Secondary | ICD-10-CM | POA: Diagnosis not present

## 2024-01-04 DIAGNOSIS — G5603 Carpal tunnel syndrome, bilateral upper limbs: Secondary | ICD-10-CM | POA: Diagnosis not present

## 2024-01-04 DIAGNOSIS — M19042 Primary osteoarthritis, left hand: Secondary | ICD-10-CM | POA: Diagnosis not present

## 2024-01-04 DIAGNOSIS — G5602 Carpal tunnel syndrome, left upper limb: Secondary | ICD-10-CM | POA: Diagnosis not present

## 2024-01-04 NOTE — Procedures (Signed)
 Patient Name: Jon [name]  MRN: [id number]  Epilepsy Attending: Arlin MALVA Krebs  Referring Physician/Provider: Dr Aisha Seals Date: 12/07/2023 Duration: 1 hour 22 mins 12/07/2023 2102 to 2225  Patient history:  73 year old male with left temporal arteriovenous malformation presenting with seizure-like activity. Rapid eeg was performed to evaluate for seizure.  Of note, report is delayed because I was notified of study today and was not on-call but this was performed   Level of alertness: Awake  Technical aspects: This EEG was obtained using a 10 lead EEG system positioned circumferentially without any parasagittal coverage (rapid EEG). Computer selected EEG is reviewed as  well as background features and all clinically significant events.  Description: The posterior dominant rhythm consists of 8  Hz activity of moderate voltage (25-35 uV) seen predominantly in posterior head regions, symmetric and reactive to eye opening and eye closing. Hyperventilation and photic stimulation were not performed.     Of note, parts of study were difficult interpret due to significant electrode artifact  IMPRESSION: This limited ceribell EEG study is within normal limits. No seizures or epileptiform discharges were seen throughout the recording.  If suspicion for interictal activity remains a concern, a conventional EEG with video can be considered.  Marlyce Mcdougald O Klaryssa Fauth

## 2024-01-19 DIAGNOSIS — Z79899 Other long term (current) drug therapy: Secondary | ICD-10-CM | POA: Diagnosis not present

## 2024-01-25 DIAGNOSIS — G43719 Chronic migraine without aura, intractable, without status migrainosus: Secondary | ICD-10-CM | POA: Diagnosis not present

## 2024-01-25 DIAGNOSIS — Q282 Arteriovenous malformation of cerebral vessels: Secondary | ICD-10-CM | POA: Diagnosis not present

## 2024-01-25 DIAGNOSIS — I951 Orthostatic hypotension: Secondary | ICD-10-CM | POA: Diagnosis not present

## 2024-01-25 DIAGNOSIS — G40A09 Absence epileptic syndrome, not intractable, without status epilepticus: Secondary | ICD-10-CM | POA: Diagnosis not present

## 2024-01-31 DIAGNOSIS — G5601 Carpal tunnel syndrome, right upper limb: Secondary | ICD-10-CM | POA: Diagnosis not present

## 2024-02-04 DIAGNOSIS — G40909 Epilepsy, unspecified, not intractable, without status epilepticus: Secondary | ICD-10-CM | POA: Diagnosis not present

## 2024-02-15 DIAGNOSIS — G40A09 Absence epileptic syndrome, not intractable, without status epilepticus: Secondary | ICD-10-CM | POA: Diagnosis not present

## 2024-02-22 DIAGNOSIS — E119 Type 2 diabetes mellitus without complications: Secondary | ICD-10-CM | POA: Diagnosis not present

## 2024-02-22 DIAGNOSIS — H43811 Vitreous degeneration, right eye: Secondary | ICD-10-CM | POA: Diagnosis not present

## 2024-02-22 DIAGNOSIS — H52222 Regular astigmatism, left eye: Secondary | ICD-10-CM | POA: Diagnosis not present

## 2024-02-22 DIAGNOSIS — Z961 Presence of intraocular lens: Secondary | ICD-10-CM | POA: Diagnosis not present

## 2024-02-22 DIAGNOSIS — H4322 Crystalline deposits in vitreous body, left eye: Secondary | ICD-10-CM | POA: Diagnosis not present

## 2024-03-10 DIAGNOSIS — J811 Chronic pulmonary edema: Secondary | ICD-10-CM | POA: Diagnosis not present

## 2024-03-10 DIAGNOSIS — E1141 Type 2 diabetes mellitus with diabetic mononeuropathy: Secondary | ICD-10-CM | POA: Diagnosis not present

## 2024-03-10 DIAGNOSIS — G43909 Migraine, unspecified, not intractable, without status migrainosus: Secondary | ICD-10-CM | POA: Diagnosis not present

## 2024-03-10 DIAGNOSIS — X58XXXA Exposure to other specified factors, initial encounter: Secondary | ICD-10-CM | POA: Diagnosis not present

## 2024-03-10 DIAGNOSIS — R29818 Other symptoms and signs involving the nervous system: Secondary | ICD-10-CM | POA: Diagnosis not present

## 2024-03-10 DIAGNOSIS — S066XAA Traumatic subarachnoid hemorrhage with loss of consciousness status unknown, initial encounter: Secondary | ICD-10-CM | POA: Diagnosis not present

## 2024-03-10 DIAGNOSIS — S066X0A Traumatic subarachnoid hemorrhage without loss of consciousness, initial encounter: Secondary | ICD-10-CM | POA: Diagnosis not present

## 2024-03-10 DIAGNOSIS — I10 Essential (primary) hypertension: Secondary | ICD-10-CM | POA: Diagnosis not present

## 2024-03-10 DIAGNOSIS — G40909 Epilepsy, unspecified, not intractable, without status epilepticus: Secondary | ICD-10-CM | POA: Diagnosis not present

## 2024-03-10 DIAGNOSIS — Z8673 Personal history of transient ischemic attack (TIA), and cerebral infarction without residual deficits: Secondary | ICD-10-CM | POA: Diagnosis not present

## 2024-03-10 DIAGNOSIS — M25562 Pain in left knee: Secondary | ICD-10-CM | POA: Diagnosis not present

## 2024-03-10 DIAGNOSIS — R5383 Other fatigue: Secondary | ICD-10-CM | POA: Diagnosis not present

## 2024-03-10 DIAGNOSIS — R519 Headache, unspecified: Secondary | ICD-10-CM | POA: Diagnosis not present

## 2024-03-10 DIAGNOSIS — I700 Atherosclerosis of aorta: Secondary | ICD-10-CM | POA: Diagnosis not present

## 2024-03-10 DIAGNOSIS — I609 Nontraumatic subarachnoid hemorrhage, unspecified: Secondary | ICD-10-CM | POA: Diagnosis not present

## 2024-03-10 DIAGNOSIS — I619 Nontraumatic intracerebral hemorrhage, unspecified: Secondary | ICD-10-CM | POA: Diagnosis not present

## 2024-03-10 DIAGNOSIS — G459 Transient cerebral ischemic attack, unspecified: Secondary | ICD-10-CM | POA: Diagnosis not present

## 2024-03-10 DIAGNOSIS — I491 Atrial premature depolarization: Secondary | ICD-10-CM | POA: Diagnosis not present

## 2024-03-10 DIAGNOSIS — R4789 Other speech disturbances: Secondary | ICD-10-CM | POA: Diagnosis not present

## 2024-03-10 DIAGNOSIS — Z8659 Personal history of other mental and behavioral disorders: Secondary | ICD-10-CM | POA: Diagnosis not present

## 2024-03-10 DIAGNOSIS — F332 Major depressive disorder, recurrent severe without psychotic features: Secondary | ICD-10-CM | POA: Diagnosis not present

## 2024-03-10 DIAGNOSIS — E782 Mixed hyperlipidemia: Secondary | ICD-10-CM | POA: Diagnosis not present

## 2024-03-10 DIAGNOSIS — G44311 Acute post-traumatic headache, intractable: Secondary | ICD-10-CM | POA: Diagnosis not present

## 2024-03-10 DIAGNOSIS — G8929 Other chronic pain: Secondary | ICD-10-CM | POA: Diagnosis not present

## 2024-03-10 DIAGNOSIS — R259 Unspecified abnormal involuntary movements: Secondary | ICD-10-CM | POA: Diagnosis not present

## 2024-03-10 DIAGNOSIS — G43009 Migraine without aura, not intractable, without status migrainosus: Secondary | ICD-10-CM | POA: Diagnosis not present

## 2024-03-10 DIAGNOSIS — Q282 Arteriovenous malformation of cerebral vessels: Secondary | ICD-10-CM | POA: Diagnosis not present

## 2024-03-10 DIAGNOSIS — S066X0D Traumatic subarachnoid hemorrhage without loss of consciousness, subsequent encounter: Secondary | ICD-10-CM | POA: Diagnosis not present

## 2024-03-10 DIAGNOSIS — I69354 Hemiplegia and hemiparesis following cerebral infarction affecting left non-dominant side: Secondary | ICD-10-CM | POA: Diagnosis not present

## 2024-03-10 DIAGNOSIS — A419 Sepsis, unspecified organism: Secondary | ICD-10-CM | POA: Diagnosis not present

## 2024-03-20 DIAGNOSIS — F331 Major depressive disorder, recurrent, moderate: Secondary | ICD-10-CM | POA: Diagnosis not present

## 2024-03-23 DIAGNOSIS — I10 Essential (primary) hypertension: Secondary | ICD-10-CM | POA: Diagnosis not present

## 2024-03-23 DIAGNOSIS — E119 Type 2 diabetes mellitus without complications: Secondary | ICD-10-CM | POA: Diagnosis not present

## 2024-03-23 DIAGNOSIS — G43909 Migraine, unspecified, not intractable, without status migrainosus: Secondary | ICD-10-CM | POA: Diagnosis not present

## 2024-03-23 DIAGNOSIS — F32A Depression, unspecified: Secondary | ICD-10-CM | POA: Diagnosis not present

## 2024-03-23 DIAGNOSIS — G4733 Obstructive sleep apnea (adult) (pediatric): Secondary | ICD-10-CM | POA: Diagnosis not present

## 2024-03-23 DIAGNOSIS — S066X0D Traumatic subarachnoid hemorrhage without loss of consciousness, subsequent encounter: Secondary | ICD-10-CM | POA: Diagnosis not present

## 2024-03-23 DIAGNOSIS — E782 Mixed hyperlipidemia: Secondary | ICD-10-CM | POA: Diagnosis not present

## 2024-03-26 DIAGNOSIS — G43909 Migraine, unspecified, not intractable, without status migrainosus: Secondary | ICD-10-CM | POA: Diagnosis not present

## 2024-03-26 DIAGNOSIS — F32A Depression, unspecified: Secondary | ICD-10-CM | POA: Diagnosis not present

## 2024-03-26 DIAGNOSIS — E782 Mixed hyperlipidemia: Secondary | ICD-10-CM | POA: Diagnosis not present

## 2024-03-26 DIAGNOSIS — G4733 Obstructive sleep apnea (adult) (pediatric): Secondary | ICD-10-CM | POA: Diagnosis not present

## 2024-03-26 DIAGNOSIS — S066X0D Traumatic subarachnoid hemorrhage without loss of consciousness, subsequent encounter: Secondary | ICD-10-CM | POA: Diagnosis not present

## 2024-03-26 DIAGNOSIS — E119 Type 2 diabetes mellitus without complications: Secondary | ICD-10-CM | POA: Diagnosis not present

## 2024-03-26 DIAGNOSIS — I10 Essential (primary) hypertension: Secondary | ICD-10-CM | POA: Diagnosis not present

## 2024-04-13 DIAGNOSIS — G43E09 Chronic migraine with aura, not intractable, without status migrainosus: Secondary | ICD-10-CM | POA: Diagnosis not present
